# Patient Record
Sex: Male | Born: 1960
Health system: Southern US, Community
[De-identification: ages and names within clinical notes are randomized; demographics above are authoritative.]

## PROBLEM LIST (undated history)

## (undated) ENCOUNTER — Emergency Department (HOSPITAL_COMMUNITY): Disposition: A | Discharge status: Home / Self Care | Payer: 59

## (undated) DIAGNOSIS — E669 Obesity, unspecified: Secondary | ICD-10-CM

## (undated) DIAGNOSIS — E785 Hyperlipidemia, unspecified: Secondary | ICD-10-CM

## (undated) DIAGNOSIS — G4730 Sleep apnea, unspecified: Secondary | ICD-10-CM

## (undated) DIAGNOSIS — E119 Type 2 diabetes mellitus without complications: Secondary | ICD-10-CM

## (undated) DIAGNOSIS — Z8673 Personal history of transient ischemic attack (TIA), and cerebral infarction without residual deficits: Secondary | ICD-10-CM

## (undated) DIAGNOSIS — M461 Sacroiliitis, not elsewhere classified: Secondary | ICD-10-CM

## (undated) DIAGNOSIS — I10 Essential (primary) hypertension: Secondary | ICD-10-CM

## (undated) DIAGNOSIS — I8392 Asymptomatic varicose veins of left lower extremity: Secondary | ICD-10-CM

## (undated) DIAGNOSIS — L255 Unspecified contact dermatitis due to plants, except food: Secondary | ICD-10-CM

## (undated) HISTORY — DX: Unspecified contact dermatitis due to plants, except food: L25.5

## (undated) HISTORY — DX: Obesity, unspecified: E66.9

## (undated) HISTORY — DX: Essential (primary) hypertension: I10

## (undated) HISTORY — PX: HERNIA REPAIR: SHX51

## (undated) HISTORY — DX: Hyperlipidemia, unspecified: E78.5

## (undated) HISTORY — DX: Sleep apnea, unspecified: G47.30

## (undated) HISTORY — DX: Personal history of transient ischemic attack (TIA), and cerebral infarction without residual deficits: Z86.73

## (undated) HISTORY — DX: Sacroiliitis, not elsewhere classified: M46.1

---

## 2003-06-09 ENCOUNTER — Encounter: Payer: Self-pay | Admitting: Emergency Medicine

## 2003-06-09 ENCOUNTER — Emergency Department (HOSPITAL_COMMUNITY): Admission: EM | Admit: 2003-06-09 | Discharge: 2003-06-09 | Payer: Self-pay | Admitting: Emergency Medicine

## 2003-10-23 ENCOUNTER — Encounter: Admission: RE | Admit: 2003-10-23 | Discharge: 2003-10-23 | Payer: Self-pay | Admitting: Family Medicine

## 2004-04-09 ENCOUNTER — Encounter: Payer: Self-pay | Admitting: Family Medicine

## 2004-04-09 LAB — CONVERTED CEMR LAB: PSA: 1.2 ng/mL

## 2005-07-22 ENCOUNTER — Ambulatory Visit: Payer: Self-pay | Admitting: Family Medicine

## 2005-10-18 ENCOUNTER — Other Ambulatory Visit: Payer: Self-pay

## 2005-10-18 ENCOUNTER — Emergency Department: Payer: Self-pay | Admitting: Emergency Medicine

## 2005-10-21 ENCOUNTER — Ambulatory Visit: Payer: Self-pay | Admitting: Family Medicine

## 2006-10-20 ENCOUNTER — Ambulatory Visit: Payer: Self-pay | Admitting: Family Medicine

## 2006-11-09 ENCOUNTER — Ambulatory Visit: Payer: Self-pay | Admitting: Family Medicine

## 2006-11-11 ENCOUNTER — Ambulatory Visit: Payer: Self-pay | Admitting: Family Medicine

## 2006-11-16 ENCOUNTER — Ambulatory Visit: Payer: Self-pay

## 2006-11-16 HISTORY — PX: OTHER SURGICAL HISTORY: SHX169

## 2006-11-17 ENCOUNTER — Encounter: Payer: Self-pay | Admitting: Family Medicine

## 2006-11-30 ENCOUNTER — Encounter: Payer: Self-pay | Admitting: Family Medicine

## 2006-12-22 ENCOUNTER — Ambulatory Visit: Payer: Self-pay

## 2006-12-30 ENCOUNTER — Ambulatory Visit: Payer: Self-pay | Admitting: Cardiovascular Disease

## 2006-12-30 ENCOUNTER — Ambulatory Visit (HOSPITAL_COMMUNITY): Admission: RE | Admit: 2006-12-30 | Discharge: 2006-12-30 | Payer: Self-pay | Admitting: Cardiovascular Disease

## 2007-01-12 ENCOUNTER — Telehealth (INDEPENDENT_AMBULATORY_CARE_PROVIDER_SITE_OTHER): Payer: Self-pay | Admitting: *Deleted

## 2007-01-17 ENCOUNTER — Encounter: Payer: Self-pay | Admitting: Family Medicine

## 2007-02-01 ENCOUNTER — Encounter: Payer: Self-pay | Admitting: Family Medicine

## 2007-02-01 DIAGNOSIS — Z8679 Personal history of other diseases of the circulatory system: Secondary | ICD-10-CM | POA: Insufficient documentation

## 2007-02-01 DIAGNOSIS — I10 Essential (primary) hypertension: Secondary | ICD-10-CM | POA: Insufficient documentation

## 2007-02-01 DIAGNOSIS — M461 Sacroiliitis, not elsewhere classified: Secondary | ICD-10-CM

## 2007-02-02 ENCOUNTER — Ambulatory Visit: Payer: Self-pay | Admitting: Family Medicine

## 2007-02-02 DIAGNOSIS — E785 Hyperlipidemia, unspecified: Secondary | ICD-10-CM

## 2007-02-02 DIAGNOSIS — E1169 Type 2 diabetes mellitus with other specified complication: Secondary | ICD-10-CM | POA: Insufficient documentation

## 2007-02-02 DIAGNOSIS — R319 Hematuria, unspecified: Secondary | ICD-10-CM | POA: Insufficient documentation

## 2007-02-02 LAB — CONVERTED CEMR LAB
Bilirubin Urine: NEGATIVE
Glucose, Urine, Semiquant: NEGATIVE
Ketones, urine, test strip: NEGATIVE
Nitrite: NEGATIVE
Specific Gravity, Urine: 1.025
Urobilinogen, UA: 0.2
WBC Urine, dipstick: NEGATIVE

## 2007-02-03 ENCOUNTER — Encounter: Payer: Self-pay | Admitting: Family Medicine

## 2007-02-07 LAB — CONVERTED CEMR LAB
ALT: 32 units/L (ref 0–40)
AST: 28 units/L (ref 0–37)
Albumin: 3.4 g/dL — ABNORMAL LOW (ref 3.5–5.2)
BUN: 12 mg/dL (ref 6–23)
Basophils Absolute: 0 10*3/uL (ref 0.0–0.1)
Basophils Relative: 0.2 % (ref 0.0–1.0)
CO2: 28 meq/L (ref 19–32)
Calcium: 9.1 mg/dL (ref 8.4–10.5)
Chloride: 107 meq/L (ref 96–112)
Cholesterol: 128 mg/dL (ref 0–200)
Creatinine, Ser: 1.2 mg/dL (ref 0.4–1.5)
Eosinophils Absolute: 0.1 10*3/uL (ref 0.0–0.6)
Eosinophils Relative: 1.3 % (ref 0.0–5.0)
GFR calc Af Amer: 84 mL/min
GFR calc non Af Amer: 69 mL/min
Glucose, Bld: 99 mg/dL (ref 70–99)
HCT: 35.6 % — ABNORMAL LOW (ref 39.0–52.0)
HDL: 47.6 mg/dL (ref >39.0)
Hemoglobin: 12.3 g/dL — ABNORMAL LOW (ref 13.0–17.0)
LDL Cholesterol: 65 mg/dL (ref 0–99)
Lymphocytes Relative: 28.8 % (ref 12.0–46.0)
MCHC: 34.6 g/dL (ref 30.0–36.0)
MCV: 96.7 fL (ref 78.0–100.0)
Monocytes Absolute: 0.8 10*3/uL — ABNORMAL HIGH (ref 0.2–0.7)
Monocytes Relative: 10.7 % (ref 3.0–11.0)
Neutro Abs: 4.7 10*3/uL (ref 1.4–7.7)
Neutrophils Relative %: 59 % (ref 43.0–77.0)
Phosphorus: 3.6 mg/dL (ref 2.3–4.6)
Platelets: 322 10*3/uL (ref 150–400)
Potassium: 3.7 meq/L (ref 3.5–5.1)
RBC: 3.68 M/uL — ABNORMAL LOW (ref 4.22–5.81)
RDW: 12.2 % (ref 11.5–14.6)
Sodium: 139 meq/L (ref 135–145)
Total CHOL/HDL Ratio: 2.7
Triglycerides: 77 mg/dL (ref 0–149)
VLDL: 15 mg/dL (ref 0–40)
WBC: 7.9 10*3/uL (ref 4.5–10.5)

## 2007-05-15 ENCOUNTER — Encounter: Payer: Self-pay | Admitting: Family Medicine

## 2008-01-20 ENCOUNTER — Telehealth: Payer: Self-pay | Admitting: Family Medicine

## 2008-07-25 ENCOUNTER — Ambulatory Visit: Payer: Self-pay | Admitting: Family Medicine

## 2008-08-24 ENCOUNTER — Ambulatory Visit: Payer: Self-pay | Admitting: Family Medicine

## 2008-08-28 LAB — CONVERTED CEMR LAB
ALT: 36 units/L (ref 0–53)
AST: 34 units/L (ref 0–37)
Albumin: 3.8 g/dL (ref 3.5–5.2)
Alkaline Phosphatase: 50 units/L (ref 39–117)
BUN: 22 mg/dL (ref 6–23)
Basophils Absolute: 0 10*3/uL (ref 0.0–0.1)
Basophils Relative: 0.1 % (ref 0.0–3.0)
Bilirubin, Direct: 0.2 mg/dL (ref 0.0–0.3)
CO2: 28 meq/L (ref 19–32)
Calcium: 9.5 mg/dL (ref 8.4–10.5)
Chloride: 105 meq/L (ref 96–112)
Cholesterol: 177 mg/dL (ref 0–200)
Creatinine, Ser: 1.2 mg/dL (ref 0.4–1.5)
Eosinophils Absolute: 0.2 10*3/uL (ref 0.0–0.7)
Eosinophils Relative: 2.1 % (ref 0.0–5.0)
GFR calc Af Amer: 83 mL/min
GFR calc non Af Amer: 69 mL/min
Glucose, Bld: 107 mg/dL — ABNORMAL HIGH (ref 70–99)
HCT: 42.2 % (ref 39.0–52.0)
HDL: 37.6 mg/dL — ABNORMAL LOW (ref >39.0)
Hemoglobin: 14.8 g/dL (ref 13.0–17.0)
LDL Cholesterol: 125 mg/dL — ABNORMAL HIGH (ref 0–99)
Lymphocytes Relative: 37.6 % (ref 12.0–46.0)
MCHC: 35 g/dL (ref 30.0–36.0)
MCV: 94.9 fL (ref 78.0–100.0)
Monocytes Absolute: 1 10*3/uL (ref 0.1–1.0)
Monocytes Relative: 13.4 % — ABNORMAL HIGH (ref 3.0–12.0)
Neutro Abs: 3.4 10*3/uL (ref 1.4–7.7)
Neutrophils Relative %: 46.8 % (ref 43.0–77.0)
Platelets: 238 10*3/uL (ref 150–400)
Potassium: 3.9 meq/L (ref 3.5–5.1)
RBC: 4.45 M/uL (ref 4.22–5.81)
RDW: 12.4 % (ref 11.5–14.6)
Sodium: 138 meq/L (ref 135–145)
TSH: 0.76 microintl units/mL (ref 0.35–5.50)
Total Bilirubin: 1.2 mg/dL (ref 0.3–1.2)
Total CHOL/HDL Ratio: 4.7
Total Protein: 7.6 g/dL (ref 6.0–8.3)
Triglycerides: 72 mg/dL (ref 0–149)
VLDL: 14 mg/dL (ref 0–40)
WBC: 7.3 10*3/uL (ref 4.5–10.5)

## 2008-09-24 ENCOUNTER — Telehealth: Payer: Self-pay | Admitting: Family Medicine

## 2008-11-08 ENCOUNTER — Ambulatory Visit: Payer: Self-pay | Admitting: Family Medicine

## 2009-02-01 ENCOUNTER — Ambulatory Visit: Payer: Self-pay | Admitting: Internal Medicine

## 2009-04-24 ENCOUNTER — Telehealth: Payer: Self-pay | Admitting: Family Medicine

## 2009-09-02 ENCOUNTER — Ambulatory Visit: Payer: Self-pay | Admitting: Family Medicine

## 2009-09-02 ENCOUNTER — Telehealth: Payer: Self-pay | Admitting: Family Medicine

## 2009-09-02 DIAGNOSIS — E669 Obesity, unspecified: Secondary | ICD-10-CM

## 2009-09-06 ENCOUNTER — Ambulatory Visit: Payer: Self-pay | Admitting: Family Medicine

## 2009-10-02 ENCOUNTER — Ambulatory Visit: Payer: Self-pay | Admitting: Pulmonary Disease

## 2009-10-03 ENCOUNTER — Ambulatory Visit: Payer: Self-pay | Admitting: Pulmonary Disease

## 2009-10-07 ENCOUNTER — Telehealth: Payer: Self-pay | Admitting: Pulmonary Disease

## 2009-10-15 DIAGNOSIS — G4733 Obstructive sleep apnea (adult) (pediatric): Secondary | ICD-10-CM

## 2009-11-07 ENCOUNTER — Encounter: Payer: Self-pay | Admitting: Pulmonary Disease

## 2010-01-02 ENCOUNTER — Ambulatory Visit: Payer: Self-pay | Admitting: Family Medicine

## 2010-01-02 LAB — CONVERTED CEMR LAB
Albumin: 3.9 g/dL (ref 3.5–5.2)
BUN: 16 mg/dL (ref 6–23)
Basophils Absolute: 0 10*3/uL (ref 0.0–0.1)
Basophils Relative: 0.4 % (ref 0.0–3.0)
CO2: 26 meq/L (ref 19–32)
Calcium: 9.7 mg/dL (ref 8.4–10.5)
Chloride: 100 meq/L (ref 96–112)
Creatinine, Ser: 1.1 mg/dL (ref 0.4–1.5)
Eosinophils Absolute: 0.1 10*3/uL (ref 0.0–0.7)
Eosinophils Relative: 1.2 % (ref 0.0–5.0)
GFR calc non Af Amer: 88.68 mL/min (ref >60)
Glucose, Bld: 111 mg/dL — ABNORMAL HIGH (ref 70–99)
HCT: 43.4 % (ref 39.0–52.0)
Hemoglobin: 14.8 g/dL (ref 13.0–17.0)
Lymphocytes Relative: 33.1 % (ref 12.0–46.0)
Lymphs Abs: 2.5 10*3/uL (ref 0.7–4.0)
MCHC: 34.1 g/dL (ref 30.0–36.0)
MCV: 96.4 fL (ref 78.0–100.0)
Monocytes Absolute: 0.7 10*3/uL (ref 0.1–1.0)
Monocytes Relative: 9.6 % (ref 3.0–12.0)
Neutro Abs: 4.3 10*3/uL (ref 1.4–7.7)
Neutrophils Relative %: 55.7 % (ref 43.0–77.0)
Phosphorus: 3.1 mg/dL (ref 2.3–4.6)
Platelets: 285 10*3/uL (ref 150.0–400.0)
Potassium: 4.1 meq/L (ref 3.5–5.1)
RBC: 4.5 M/uL (ref 4.22–5.81)
RDW: 12.8 % (ref 11.5–14.6)
Sodium: 135 meq/L (ref 135–145)
Uric Acid, Serum: 8.6 mg/dL — ABNORMAL HIGH (ref 4.0–7.8)
WBC: 7.7 10*3/uL (ref 4.5–10.5)

## 2010-02-26 ENCOUNTER — Encounter: Payer: Self-pay | Admitting: Family Medicine

## 2010-04-15 ENCOUNTER — Telehealth: Payer: Self-pay | Admitting: Family Medicine

## 2010-04-23 ENCOUNTER — Ambulatory Visit: Payer: Self-pay | Admitting: Family Medicine

## 2010-09-18 ENCOUNTER — Ambulatory Visit: Payer: Self-pay | Admitting: Family Medicine

## 2010-09-18 NOTE — Miscellaneous (Signed)
Summary: reviewed apnea link with pt.   Clinical Lists Changes  finally got in touch with patient about sleep study, showing very mild osa.  He is essentially asymptomatic, and is currently trying to work on weight loss.  This is not a CV risk for him.  At this point, he would like to just work on weight loss, and to see how things go.  He will call if increasing symptoms.  He understands that he is to not drive if having sleepiness issues.

## 2010-09-18 NOTE — Letter (Signed)
Summary: Out of Work  [employer] at [facility]  [address] [place], [place] [address]   Phone: [phone]  Fax: [phone]    September 06, 2009   Employee:  Fayrene Fearing A Corsetti    To Whom It May Concern:   For Medical reasons, please excuse the above named employee from work for the following dates:  Start:   09/06/2009  End:   can retun monday 09/09/2009 if he is feeling better   If you need additional information, please feel free to contact our office.         Sincerely,    Judith Part MD

## 2010-09-18 NOTE — Assessment & Plan Note (Signed)
Summary: SWOLLEN R FOOT/CLE   Vital Signs:  Patient profile:   50 year old male Height:      71 inches Weight:      274.50 pounds BMI:     38.42 Temp:     98.1 degrees F oral Pulse rate:   72 / minute Pulse rhythm:   regular BP sitting:   132 / 90  (left arm) Cuff size:   large  Vitals Entered By: Lewanda Rife LPN (Jan 02, 2010 9:15 AM) CC: swollen rt foot with pain at ankle on and off   History of Present Illness: r foot is aching and swelling  is hurting around ankle comes and goes  sometimes cannot get into shoes no trauma  worse when he gets up in the am - stiff and hard to walk on   no redness no heat  just swollen  no previous injury in the past   no meds for it   big toe feels just a little numb   is wearing uncomfortable shoes at work -- uses orthotics in them -- they feel good now   no hx of gout  not really that painful   bp is up today  2 pharmacies are out of his benicar and he has not had any in 2 days ? do we have samples  his regular pharmacy promises it to him by 2 pm- will get back on it  bp has been well controlled no headaches or other symptoms  other than this- no missed doses   Allergies: 1)  Lotensin  Past History:  Past Medical History: Last updated: 10/02/2009   OBESITY (ICD-278.00) CONTACT DERMATITIS&OTHER ECZEMA DUE TO PLANTS (ICD-692.6) HEMATURIA, MICROSCOPIC (ICD-599.7) HYPERLIPIDEMIA (ICD-272.4) Hx of SACROILIITIS (ICD-720.2) CEREBROVASCULAR ACCIDENT, HX OF (ICD-V12.50) HYPERTENSION (ICD-401.9)    Past Surgical History: Last updated: 02/01/2007 Hernia- umbilical Carotid dopplers- neg (11/2006)  Family History: Last updated: 02/02/2007 Father:  Mother: HTN, DM Siblings:  no cancer in family some high chol in family  Social History: Last updated: 10/02/2009 Marital Status: Married Children: 2 Occupation: truck Hospital doctor for [employer]  Risk Factors: Smoking Status: never (02/01/2007)  Review  of Systems General:  Denies chills, fatigue, fever, loss of appetite, and malaise. Eyes:  Denies blurring and eye pain. CV:  Denies chest pain or discomfort, lightheadness, and palpitations. Resp:  Denies cough, pleuritic, shortness of breath, and wheezing. GI:  Denies abdominal pain, change in bowel habits, and nausea. GU:  Denies dysuria. MS:  Complains of joint pain, joint swelling, and stiffness; denies joint redness, muscle aches, cramps, and muscle weakness. Derm:  Denies itching, lesion(s), poor wound healing, and rash. Neuro:  Denies numbness, tingling, and weakness. Heme:  Denies abnormal bruising and bleeding.  Physical Exam  General:  overweight but generally well appearing  Eyes:  vision grossly intact, pupils equal, pupils round, pupils reactive to light, and no injection.   Mouth:  pharynx pink and moist.   Neck:  supple with full rom and no masses or thyromegally, no JVD or carotid bruit  Lungs:  CTA wth harsh bs at bases no rales or rhonchi or wheeze   Heart:  Normal rate and regular rhythm. S1 and S2 normal without gallop, murmur, click, rub or other extra sounds. Msk:  R ankle swelling with poss effusion- bilat malleoli bony tenderness med malleolus and behind it  nl rom - pain on plantarflex nl talar tilt no foot tenderness favors other foot  marked ped planus iin R  foot   Pulses:  R and L carotid,radial,femoral,dorsalis pedis and posterior tibial pulses are full and equal bilaterally Neurologic:  sensation intact to light touch and DTRs symmetrical and normal.   Skin:  Intact without suspicious lesions or rashes Psych:  normal affect, talkative and pleasant    Impression & Recommendations:  Problem # 1:  ANKLE PAIN (ICD-719.47) Assessment New with focal bilat swelling/poss eff  no hx trauma most pain / tenderness is medial  check labs incl uric acid - but doubt gout x ray  mobic 15 once daily with food then update with plan after getting results    Orders: TLB-Renal Function Panel (80069-RENAL) TLB-CBC Platelet - w/Differential (85025-CBCD) TLB-Uric Acid, Blood (84550-URIC)  Problem # 2:  HYPERTENSION (ICD-401.9) Assessment: Unchanged 2 d without benicar due to pharmacy out-- will get today lab today bp up a little - expected without med  His updated medication list for this problem includes:    Benicar Hct 40-25 Mg Tabs (Olmesartan medoxomil-hctz) .Marland Kitchen... Take one by mouth daily    Lopressor 50 Mg Tabs (Metoprolol tartrate) .Marland Kitchen... 1/2 by mouth two times a day    Hydrochlorothiazide 25 Mg Tabs (Hydrochlorothiazide) .Marland Kitchen... Take one by mouth daily  Orders: Venipuncture (04540) TLB-Renal Function Panel (80069-RENAL)  Complete Medication List: 1)  Benicar Hct 40-25 Mg Tabs (Olmesartan medoxomil-hctz) .... Take one by mouth daily 2)  Bayer Aspirin 325 Mg Tabs (Aspirin) .... Take one by mouth daily 3)  Lopressor 50 Mg Tabs (Metoprolol tartrate) .... 1/2 by mouth two times a day 4)  Hydrochlorothiazide 25 Mg Tabs (Hydrochlorothiazide) .... Take one by mouth daily 5)  Mobic 15 Mg Tabs (Meloxicam) .Marland Kitchen.. 1 by mouth once daily  Other Orders: Radiology Referral (Radiology)  Patient Instructions: 1)  we will schedule x ray in [place] at check out  2)  start mobic with food one pill daily --  stop if it gives you GI upset  3)  labs today  4)  will make plan when tests return  5)  use ice on ankle when it is painful  Prescriptions: MOBIC 15 MG TABS (MELOXICAM) 1 by mouth once daily  #30 x 0   Entered and Authorized by:   Judith Part MD   Signed by:   Judith Part MD on 01/02/2010   Method used:   [facility]        [facility]  [address] [facility]       [address]       [place], [place]  [address]       Ph: [phone] or [phone]       Fax: [phone]   RxID:   [id number]   Current Allergies (reviewed today): LOTENSIN

## 2010-09-18 NOTE — Progress Notes (Signed)
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 Patient states that he has to  have a test done and if you can not perform it then, you need to refer him to someone to do it, the test is a maintenance of wakefulness test. Please advise. Initial call taken by: Sydell Axon LPN,  September 02, 2009 11:00 AM  Follow-up for Phone Call        ok- I will go ahead and do ref to our sleep clinic -- will route to [facility] Follow-up by: Judith Part MD,  September 02, 2009 11:10 AM  Additional Follow-up for Phone Call Additional follow up Details #1::        Pulmonary appt made with Dr Shelle Iron on 09/19/2009 at 8:45am, patient notified by phone. Additional Follow-up by: Carlton Adam,  September 04, 2009 8:39 AM

## 2010-09-18 NOTE — Assessment & Plan Note (Signed)
Summary: COUGH  CYD   Vital Signs:  Patient profile:   50 year old male Weight:      271 pounds Temp:     98 degrees F oral Pulse rate:   84 / minute Pulse rhythm:   regular BP sitting:   108 / 58  (left arm) Cuff size:   large  Vitals Entered By: Lowella Petties CMA (September 06, 2009 10:18 AM) CC: Cough, chest congestion, achy.   History of Present Illness: symptoms started wed with fever and cold symptoms  fever was 102 --just first day- now back to normal coughing severely - runny nose and drainage  chest is sore from cough  little phlesm -- yellow to brown  no wheeze- but chest is rattling   not taking much otc - except chlorcedin which is not helping  tylenol for fever      Allergies: 1)  Lotensin  Past History:  Past Medical History: Last updated: 07/25/2008 Hypertension Cerebrovascular accident, hx of hx of sacroilitis   Past Surgical History: Last updated: 02/01/2007 Hernia- umbilical Carotid dopplers- neg (11/2006)  Family History: Last updated: 02/02/2007 Father:  Mother: HTN, DM Siblings:  no cancer in family some high chol in family  Social History: Last updated: 02/01/2007 Marital Status: Married Children: 2 Occupation: truck Hospital doctor  Risk Factors: Smoking Status: never (02/01/2007)  Review of Systems General:  Complains of chills, fatigue, fever, and loss of appetite. Eyes:  Denies discharge and eye irritation. ENT:  Complains of nasal congestion, postnasal drainage, and sore throat; denies earache and sinus pressure. CV:  Denies chest pain or discomfort and palpitations. Resp:  Complains of cough; denies shortness of breath and wheezing. GI:  Denies abdominal pain and change in bowel habits. MS:  Complains of muscle aches. Derm:  Denies rash.  Physical Exam  General:  overwt and fatigued appearing Head:  normocephalic, atraumatic, and no abnormalities observed.  no sinus tenderness Eyes:  vision grossly intact, pupils equal,  pupils round, pupils reactive to light, and no injection.   Ears:  R ear normal and L ear normal.   Nose:  nares injected with clear rhinorrhea Mouth:  pharynx pink and moist, no erythema, and no exudates.   Neck:  supple with full rom and no masses or thyromegally, no JVD or carotid bruit  Chest Wall:  No deformities, masses, tenderness or gynecomastia noted. Lungs:  CTA wth harsh bs at bases no rales or rhonchi or wheeze   Heart:  Normal rate and regular rhythm. S1 and S2 normal without gallop, murmur, click, rub or other extra sounds. Skin:  Intact without suspicious lesions or rashes Cervical Nodes:  No lymphadenopathy noted Psych:  normal affect, talkative and pleasant    Impression & Recommendations:  Problem # 1:  URI (ICD-465.9) Assessment New viral with cough and congestion  recommend sympt care- see pt instructions   tessalon and tussionex with caution expectorant and antihistamine as needed otc  pt advised to update me if symptoms worsen or do not improve - esp cough wheeze or fever  His updated medication list for this problem includes:    Bayer Aspirin 325 Mg Tabs (Aspirin) .Marland Kitchen... Take one by mouth daily    Tessalon 200 Mg Caps (Benzonatate) .Marland Kitchen... 1 by mouth up to three times a day as needed cough    Tussionex Pennkinetic Er 8-10 Mg/27ml Lqcr (Chlorpheniramine-hydrocodone) .Marland Kitchen... 1/2 to 1 teaspoon by mouth at bedtime as needed severe cough  Complete Medication List: 1)  Benicar Hct  40-25 Mg Tabs (Olmesartan medoxomil-hctz) .... Take one by mouth daily 2)  Bayer Aspirin 325 Mg Tabs (Aspirin) .... Take one by mouth daily 3)  Lopressor 50 Mg Tabs (Metoprolol tartrate) .... 1/2 by mouth two times a day 4)  Hydrochlorothiazide 25 Mg Tabs (Hydrochlorothiazide) .... Take one by mouth daily 5)  Tessalon 200 Mg Caps (Benzonatate) .Marland Kitchen.. 1 by mouth up to three times a day as needed cough 6)  Tussionex Pennkinetic Er 8-10 Mg/32ml Lqcr (Chlorpheniramine-hydrocodone) .... 1/2 to 1  teaspoon by mouth at bedtime as needed severe cough  Patient Instructions: 1)  try zyrtec 10 mg daily for runny nose 2)  tylenol for aches and fever 3)  lots of fluids and rest  4)  tessalon for cough during the day 5)  tussionex for cough at night - caution of sedation  6)  update me if high fever or increased cough or wheezing  Prescriptions: TUSSIONEX PENNKINETIC ER 8-10 MG/5ML LQCR (CHLORPHENIRAMINE-HYDROCODONE) 1/2 to 1 teaspoon by mouth at bedtime as needed severe cough  #8 oz x 0   Entered and Authorized by:   Judith Part MD   Signed by:   Judith Part MD on 09/06/2009   Method used:   Print then Give to Patient   RxID:   [id number] TESSALON 200 MG CAPS (BENZONATATE) 1 by mouth up to three times a day as needed cough  #30 x 0   Entered and Authorized by:   Judith Part MD   Signed by:   Judith Part MD on 09/06/2009   Method used:   Print then Give to Patient   RxID:   [id number]

## 2010-09-18 NOTE — Medication Information (Signed)
Summary: Benicar HCT Approved  Benicar HCT Approved   Imported By: Maryln Gottron 05/16/2010 11:29:03  _____________________________________________________________________  External Attachment:    Type:   Image     Comment:   External Document

## 2010-09-18 NOTE — Progress Notes (Signed)
Summary: refill request for tussionex  Phone Note Refill Request Message from:  Fax from Pharmacy  Refills Requested: Medication #1:  tussionex   Last Refilled: 09/07/2009 Faxed request from [facility], [phone]  Initial call taken by: Lowella Petties CMA,  April 15, 2010 8:12 AM  Follow-up for Phone Call        this is controlled substance- not generally refilled without visit  Follow-up by: Judith Part MD,  April 15, 2010 8:39 AM  Additional Follow-up for Phone Call Additional follow up Details #1::        Unable to reach pt by phone, left message with Alician at [facility] to let pt know controllled substance and needs to call for appt.Lewanda Rife LPN  April 15, 2010 9:54 AM

## 2010-09-18 NOTE — Assessment & Plan Note (Signed)
Summary: consult for possible osa   Copy to:  [employer] Primary Provider/Referring Provider:  Judith Part MD  CC:  Sleep Consult.  History of Present Illness: The pt is a 50y/o male who I have been asked to see for possible osa.  He has been noted to have loud snoring by his wife, but she has never commented on pauses in his breathing during sleep.  He denies choking arousals.  He goes to bed around midnight, and arises approx 8am to start his day.  He denies frequent awakenings, and feels rested in the am's upon arising.  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  He has only occasional sleep pressure with inactivity during his off days.  He denies any sleepiness in church, or while watching tv/movies on w/e.  He denies any sleepiness while driving.  His epworth score today is 6.  Medications Prior to Update: 1)  Benicar Hct 40-25 Mg  Tabs (Olmesartan Medoxomil-Hctz) .... Take One By Mouth Daily 2)  Bayer Aspirin 325 Mg  Tabs (Aspirin) .... Take One By Mouth Daily 3)  Lopressor 50 Mg Tabs (Metoprolol Tartrate) .... 1/2 By Mouth Two Times A Day 4)  Hydrochlorothiazide 25 Mg Tabs (Hydrochlorothiazide) .... Take One By Mouth Daily 5)  Tessalon 200 Mg Caps (Benzonatate) .Marland Kitchen.. 1 By Mouth Up To Three Times A Day As Needed Cough 6)  Tussionex Pennkinetic Er 8-10 Mg/68ml Lqcr (Chlorpheniramine-Hydrocodone) .... 1/2 To 1 Teaspoon By Mouth At Bedtime As Needed Severe Cough  Allergies (verified): 1)  Lotensin  Past History:  Past Medical History:   OBESITY (ICD-278.00) CONTACT DERMATITIS&OTHER ECZEMA DUE TO PLANTS (ICD-692.6) HEMATURIA, MICROSCOPIC (ICD-599.7) HYPERLIPIDEMIA (ICD-272.4) Hx of SACROILIITIS (ICD-720.2) CEREBROVASCULAR ACCIDENT, HX OF (ICD-V12.50) HYPERTENSION (ICD-401.9)    Past Surgical History: Reviewed history from 02/01/2007 and no changes required. Hernia- umbilical Carotid  dopplers- neg (11/2006)  Family History: Reviewed history from 02/02/2007 and no changes required. Father:  Mother: HTN, DM Siblings:  no cancer in family some high chol in family  Social History: Reviewed history from 02/01/2007 and no changes required. Marital Status: Married Children: 2 Occupation: truck Hospital doctor for [employer]  Review of Systems  The patient denies shortness of breath with activity, shortness of breath at rest, productive cough, non-productive cough, coughing up blood, chest pain, irregular heartbeats, acid heartburn, indigestion, loss of appetite, weight change, abdominal pain, difficulty swallowing, sore throat, tooth/dental problems, headaches, nasal congestion/difficulty breathing through nose, sneezing, itching, ear ache, anxiety, depression, hand/feet swelling, joint stiffness or pain, rash, change in color of mucus, and fever.    Vital Signs:  Patient profile:   50 year old male Height:      71 inches Weight:      274.50 pounds BMI:     38.42 O2 Sat:      98 % on Room air Temp:     97.6 degrees F oral Pulse rate:   77 / minute BP sitting:   138 / 90  (left arm) Cuff size:   large  Vitals Entered By: Arman Filter LPN (October 02, 2009 9:14 AM)  O2 Flow:  Room air CC: Sleep Consult Comments Medications reviewed with patient Arman Filter LPN  October 02, 2009 9:14 AM    Physical Exam  General:  ow male in nad Eyes:  PERRLA and EOMI.   Nose:  deviated septum to left, but patent bilat Mouth:  moderate elongation of soft palate and uvula Neck:  no jvd, tmg, LN Lungs:  clear to auscultation Heart:  rrr, no mrg Abdomen:  soft and nontender, bs+ Extremities:  no edema or cyanosis pulses intact distally Neurologic:  alert and oriented, moves all 4. doesn't appear sleepy.   Impression & Recommendations:  Problem # 1:  SNORING (ICD-786.09) the pt is a snorer, but overall is considered low risk for clinically significant  sleep apnea historically.  However, he is a truck driver, overweight, and has upper airway anatomy.  It is also hard to get a history for EDS due to the physical nature of his job during the day.  I think it is worthwhile doing a screening study at home, and if positive will need to consider treatment/further evaluation.  The pt is agreeable to this approach.  Other Orders: Misc. Referral (Misc. Ref) Consultation Level IV (16109)  Patient Instructions: 1)  will check screening sleep study at home.  I will call you with results. 2)  work on weight loss, stay off your back while sleeping.

## 2010-09-18 NOTE — Progress Notes (Signed)
Summary: sleep study results  Phone Note Call from Patient Call back at Home Phone [phone]   Caller: Patient Call For: Tereka Thorley Summary of Call: Wants results of his sleep study. Initial call taken by: Darletta Moll,  October 07, 2009 11:43 AM  Follow-up for Phone Call        Patient did apnea link. Please advise on results or if patient needs ROV to discuss.Michel Bickers [facility]  October 07, 2009 11:57 AM  Additional Follow-up for Phone Call Additional follow up Details #1::        called pt at home and asked that he call back with a number where we can reach him easily.  I will call him at that number and discuss his sleep study results.   Additional Follow-up by: Barbaraann Share MD,  October 07, 2009 5:25 PM     Appended Document: sleep study results megan, we still have not heard back from this pt.  his home number is not good for communication.Marland KitchenMarland KitchenI have tried.  See if you can get ahold of him and get [place] a better contact number.  Appended Document: sleep study results called and spoke with pt.  pt states his home # is the best # to reach him on during the day, but if you call in the evenings, pt works 2nd shift and requests you call him on his cell [phone].    Appended Document: sleep study results called and left message on cell phone. will call back

## 2010-09-18 NOTE — Assessment & Plan Note (Signed)
Summary: apnea link shows AHI 9/hr.   Copy to:  [facility] Primary Provider/Referring Provider:  Judith Part MD   History of Present Illness: apnea link shows AHI of 9/hr with desat to 82%.  this is c/w with mild osa.  I have tried to contact pt to discuss results.  Have LMOM to call us back.  Allergies: 1)  Lotensin   Appended Document: Orders Update     Clinical Lists Changes  Problems: Added new problem of OBSTRUCTIVE SLEEP APNEA (ICD-327.23) Orders: Added new Service order of Pulse Oximetry, Overnight [phone]) - Signed

## 2010-09-18 NOTE — Assessment & Plan Note (Signed)
Summary: SLEEP APNEA/CLE   Vital Signs:  Patient profile:   50 year old male Weight:      271 pounds BMI:     37.93 Temp:     97.4 degrees F oral Pulse rate:   76 / minute Pulse rhythm:   regular BP sitting:   132 / 90  (left arm) Cuff size:   large  Vitals Entered By: Lowella Petties CMA (September 02, 2009 8:50 AM)  Serial Vital Signs/Assessments:  Time      Position  BP       Pulse  Resp  Temp     By                     125/80                         Judith Part MD  CC: Discuss need for sleep study   History of Present Illness: had a DOT physical -- and sleep study was recommended   knows he needs to loose wt  gained a lot over holidays -- loosing before  is now back to better eating habits  higher intake of fiber  too much juice  does eat a lot of fruit and vegetables  walking 35-40 minutes per day   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   does snore - not loudly  no witnessed apnea  does wake up rested - is full of energy does not get sleepy during the day     Allergies: 1)  Lotensin  Past History:  Past Medical History: Last updated: 07/25/2008 Hypertension Cerebrovascular accident, hx of hx of sacroilitis   Past Surgical History: Last updated: 02/01/2007 Hernia- umbilical Carotid dopplers- neg (11/2006)  Family History: Last updated: 02/02/2007 Father:  Mother: HTN, DM Siblings:  no cancer in family some high chol in family  Social History: Last updated: 02/01/2007 Marital Status: Married Children: 2 Occupation: truck Hospital doctor  Risk Factors: Smoking Status: never (02/01/2007)  Review of Systems General:  Denies fatigue, loss of appetite, and malaise. Eyes:  Denies blurring and eye irritation. CV:  Denies chest pain or discomfort and palpitations. Resp:  Denies cough, shortness of breath, and wheezing. GI:  Denies abdominal pain and change in bowel habits. Derm:  Denies itching, lesion(s), poor wound healing, and rash. Neuro:   Denies numbness and tingling. Endo:  Denies cold intolerance, excessive thirst, excessive urination, and heat intolerance.  Physical Exam  General:  overweight but generally well appearing  Head:  normocephalic, atraumatic, and no abnormalities observed.   Eyes:  vision grossly intact, pupils equal, pupils round, and pupils reactive to light.   Mouth:  pharynx pink and moist.   uvula appears to be nl size Neck:  supple with full rom and no masses or thyromegally, no JVD or carotid bruit  Chest Wall:  No deformities, masses, tenderness or gynecomastia noted. Lungs:  Normal respiratory effort, chest expands symmetrically. Lungs are clear to auscultation, no crackles or wheezes. Heart:  Normal rate and regular rhythm. S1 and S2 normal without gallop, murmur, click, rub or other extra sounds. Extremities:  No clubbing, cyanosis, edema, or deformity noted with normal full range of motion of all joints.   Neurologic:  sensation intact to light touch, gait normal, and DTRs symmetrical and normal.   Skin:  Intact without suspicious lesions or rashes Cervical Nodes:  No lymphadenopathy noted Psych:  normal affect, talkative and pleasant  Impression & Recommendations:  Problem # 1:  OBESITY (ICD-278.00) Assessment New  DOT physician recommended sleep apnea eval on pt for risk factor of obesity due to total lack of symptoms -- I do not feel he needs eval for this  rev s/s in detail - pt lacks the fatigue/ loud snoring/ witnessed apnea or day time somnolence form filled out indicating this  if the DOT physician still feels he needs sleep study - will refer  disc plan for wt loss - disc health ramifications and risks of high bmi enc diet and exercise - rev this in detail  Orders: Sleep Disorder Referral (Sleep Disorder)  Problem # 2:  HYPERTENSION (ICD-401.9) Assessment: Unchanged  continues to be well controlled with current med  disc imp of diet and exercise also  His updated  medication list for this problem includes:    Benicar Hct 40-25 Mg Tabs (Olmesartan medoxomil-hctz) .Marland Kitchen... Take one by mouth daily    Lopressor 50 Mg Tabs (Metoprolol tartrate) .Marland Kitchen... 1/2 by mouth two times a day    Hydrochlorothiazide 25 Mg Tabs (Hydrochlorothiazide) .Marland Kitchen... Take one by mouth daily  BP today: 132/90-- re check 125/80 Prior BP: 118/72 (02/01/2009)  Labs Reviewed: K+: 3.9 (08/24/2008) Creat: : 1.2 (08/24/2008)   Chol: 177 (08/24/2008)   HDL: 37.6 (08/24/2008)   LDL: 125 (08/24/2008)   TG: 72 (08/24/2008)  Complete Medication List: 1)  Benicar Hct 40-25 Mg Tabs (Olmesartan medoxomil-hctz) .... Take one by mouth daily 2)  Bayer Aspirin 325 Mg Tabs (Aspirin) .... Take one by mouth daily 3)  Lopressor 50 Mg Tabs (Metoprolol tartrate) .... 1/2 by mouth two times a day 4)  Hydrochlorothiazide 25 Mg Tabs (Hydrochlorothiazide) .... Take one by mouth daily  Patient Instructions: 1)  keep working on healthy diet and exercise for weight loss  2)  if you begin to experience day time fatigue/ loud snoring or gasping at night - please update me   Prior Medications (reviewed today): BENICAR HCT 40-25 MG  TABS (OLMESARTAN MEDOXOMIL-HCTZ) take one by mouth daily BAYER ASPIRIN 325 MG  TABS (ASPIRIN) take one by mouth daily LOPRESSOR 50 MG TABS (METOPROLOL TARTRATE) 1/2 by mouth two times a day HYDROCHLOROTHIAZIDE 25 MG TABS (HYDROCHLOROTHIAZIDE) take one by mouth daily Current Allergies: LOTENSIN

## 2010-09-22 ENCOUNTER — Telehealth: Payer: Self-pay | Admitting: Family Medicine

## 2010-09-23 ENCOUNTER — Telehealth: Payer: Self-pay | Admitting: Family Medicine

## 2010-09-24 ENCOUNTER — Emergency Department (HOSPITAL_COMMUNITY)
Admission: EM | Admit: 2010-09-24 | Discharge: 2010-09-25 | Disposition: A | Discharge status: Home / Self Care | Payer: 59 | Attending: Emergency Medicine | Admitting: Emergency Medicine

## 2010-09-24 DIAGNOSIS — L02419 Cutaneous abscess of limb, unspecified: Secondary | ICD-10-CM | POA: Insufficient documentation

## 2010-09-24 DIAGNOSIS — I10 Essential (primary) hypertension: Secondary | ICD-10-CM | POA: Insufficient documentation

## 2010-09-24 DIAGNOSIS — L03119 Cellulitis of unspecified part of limb: Secondary | ICD-10-CM | POA: Insufficient documentation

## 2010-09-26 ENCOUNTER — Encounter: Payer: Self-pay | Admitting: Family Medicine

## 2010-09-26 ENCOUNTER — Ambulatory Visit (INDEPENDENT_AMBULATORY_CARE_PROVIDER_SITE_OTHER): Payer: 59 | Admitting: Family Medicine

## 2010-09-26 DIAGNOSIS — L02419 Cutaneous abscess of limb, unspecified: Secondary | ICD-10-CM | POA: Insufficient documentation

## 2010-09-26 DIAGNOSIS — L03119 Cellulitis of unspecified part of limb: Secondary | ICD-10-CM

## 2010-09-27 ENCOUNTER — Encounter: Payer: Self-pay | Admitting: Family Medicine

## 2010-09-29 ENCOUNTER — Ambulatory Visit (INDEPENDENT_AMBULATORY_CARE_PROVIDER_SITE_OTHER): Payer: 59 | Admitting: Family Medicine

## 2010-09-29 ENCOUNTER — Encounter: Payer: Self-pay | Admitting: Family Medicine

## 2010-09-29 DIAGNOSIS — L03119 Cellulitis of unspecified part of limb: Secondary | ICD-10-CM

## 2010-10-01 ENCOUNTER — Ambulatory Visit: Payer: 59 | Admitting: Family Medicine

## 2010-10-02 NOTE — Progress Notes (Signed)
Summary: Meloxicam 15mg   Phone Note Refill Request Call back at [phone] Message from:  [id number] on September 22, 2010 11:47 AM  Refills Requested: Medication #1:  MOBIC 15 MG TABS 1 by mouth once daily.   Last Refilled: 02/26/2010 [facility] [phone] electronically requested refill on Meloxicam 15mg . Lat refill date 02/26/10.Please advise.    Method Requested: Electronic Initial call taken by: Lewanda Rife LPN,  September 22, 2010 11:48 AM  Follow-up for Phone Call        px written on EMR for call in schedule f/u with me for may please Follow-up by: Judith Part MD,  September 22, 2010 1:34 PM  Additional Follow-up for Phone Call Additional follow up Details #1::        [facility] as instructed. Pt scheduled appt for f/u 12/25/10 at 8am.[facility] LPN  September 22, 2010 2:48 PM     New/Updated Medications: MOBIC 15 MG TABS (MELOXICAM) 1 by mouth once daily with food as needed Prescriptions: MOBIC 15 MG TABS (MELOXICAM) 1 by mouth once daily with food as needed  #30 x 0   Entered and Authorized by:   Judith Part MD   Signed by:   Judith Part MD on 09/22/2010   Method used:   Telephoned to ...       [facility] (retail)       [address]       [place], [place]  [address]       Ph: [phone] or [phone]       Fax: [phone]   RxID:   [id number]

## 2010-10-02 NOTE — Letter (Signed)
Summary: Out of Work  [facility]  [address] [place], [place] [address]   Phone: [phone]  Fax: [phone]    September 26, 2010   Employee:  Fayrene Fearing A Hollibaugh    To Whom It May Concern:   For Medical reasons, please excuse the above named employee from work for the following dates:  Start:   09/26/2010  End:   can return 10/01/2010 if he is feeling better   If you need additional information, please feel free to contact our office.         Sincerely,    Judith Part MD

## 2010-10-02 NOTE — Progress Notes (Signed)
Summary: [facility] needed [facility]  Phone Note Refill Request Message from:  Fax from Pharmacy  Refills Requested: Medication #1:  MOBIC 15 MG TABS 1 by mouth once daily with food as needed.  Medication #2:  LOPRESSOR 50 MG TABS 1/2 by mouth two times a day  Medication #3:  HYDROCHLOROTHIAZIDE 25 MG TABS take one by mouth daily Faxed forms from [facility] are on your shelf.  They are asking for new scripts.  Initial call taken by: Lowella Petties CMA, AAMA,  September 23, 2010 12:09 PM  Follow-up for Phone Call        form done and in nurse in box  Follow-up by: Judith Part MD,  September 23, 2010 12:37 PM  Additional Follow-up for Phone Call Additional follow up Details #1::        completed forms faxed to [phone].Lewanda Rife LPN  September 23, 2010 1:18 PM     Prescriptions: MOBIC 15 MG TABS (MELOXICAM) 1 by mouth once daily with food as needed  #90 x 3   Entered and Authorized by:   Judith Part MD   Signed by:   Judith Part MD on 09/23/2010   Method used:   Historical   RxID:   [id number] HYDROCHLOROTHIAZIDE 25 MG TABS (HYDROCHLOROTHIAZIDE) take one by mouth daily  #90 x 3   Entered and Authorized by:   Judith Part MD   Signed by:   Judith Part MD on 09/23/2010   Method used:   Historical   RxID:   [id number] LOPRESSOR 50 MG TABS (METOPROLOL TARTRATE) 1/2 by mouth two times a day  #90 x 3   Entered and Authorized by:   Judith Part MD   Signed by:   Judith Part MD on 09/23/2010   Method used:   Historical   RxID:   [id number]

## 2010-10-06 ENCOUNTER — Ambulatory Visit: Payer: 59 | Admitting: Family Medicine

## 2010-10-08 NOTE — Letter (Signed)
Summary: Out of Work  [facility]  [address] [place], [place] [address]   Phone: [phone]  Fax: [phone]    September 29, 2010   Employee:  Fayrene Fearing A [place]    To Whom It May Concern:   For Medical reasons, please excuse the above named employee from work for the following dates:  Start:   09/29/2010  End:   can return 10/02/2010 if he is feeling better   If you need additional information, please feel free to contact our office.         Sincerely,    Judith Part MD

## 2010-10-08 NOTE — Assessment & Plan Note (Signed)
Summary: F/U [facility] ON 09/24/10/CLE DRAINED KNOT ON LEG  [facility]   Vital Signs:  Patient profile:   50 year old male Height:      71 inches Weight:      270 pounds BMI:     37.79 Temp:     99 degrees F oral Pulse rate:   88 / minute Pulse rhythm:   regular BP sitting:   110 / 74  (left arm) Cuff size:   large  Vitals Entered By: Lewanda Rife LPN (September 26, 2010 9:11 AM) CC: f/u [facility] 09/24/10 Drained knot on rt knee. Rt knee still hurts anxd raining. Now pain level is 7.   History of Present Illness: here for f/u after I and D of abcess [facility] 2/8 was packed with iodoform gauze  some discomfort but overall much much better cannot drive/work on pain meds  a little drainage- not much  can move knee with no problem no fever or malaise    Allergies: 1)  Lotensin  Past History:  Past Medical History: Last updated: 10/02/2009   OBESITY (ICD-278.00) CONTACT DERMATITIS&OTHER ECZEMA DUE TO PLANTS (ICD-692.6) HEMATURIA, MICROSCOPIC (ICD-599.7) HYPERLIPIDEMIA (ICD-272.4) Hx of SACROILIITIS (ICD-720.2) CEREBROVASCULAR ACCIDENT, HX OF (ICD-V12.50) HYPERTENSION (ICD-401.9)    Past Surgical History: Last updated: 02/01/2007 Hernia- umbilical Carotid dopplers- neg (11/2006)  Family History: Last updated: 02/02/2007 Father:  Mother: HTN, DM Siblings:  no cancer in family some high chol in family  Social History: Last updated: 10/02/2009 Marital Status: Married Children: 2 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 Eyes:  Denies blurring. CV:  Denies chest pain or discomfort and palpitations. Resp:  Denies cough. GI:  Denies abdominal pain. MS:  Denies muscle aches and cramps. Derm:  Complains of lesion(s); denies rash. Neuro:  Denies numbness and tingling. Heme:  Denies abnormal bruising and  bleeding.  Physical Exam  General:  overweight but generally well appearing  Neck:  supple with full rom and no masses or thyromegally, no JVD or carotid bruit  Lungs:  Normal respiratory effort, chest expands symmetrically. Lungs are clear to auscultation, no crackles or wheezes. Heart:  Normal rate and regular rhythm. S1 and S2 normal without gallop, murmur, click, rub or other extra sounds. Msk:  see skin exam  Extremities:  No clubbing, cyanosis, edema, or deformity noted with normal full range of motion of all joints.   Neurologic:  sensation intact to light touch and gait normal.   Skin:  abcess R lat knee  1-2 cm  erythema 2-3 inches above nl rom knee   gauze removed abcess cleaned and then flushed with sterile water  new iodoform gauze inserted in sterile fashion sterile dresssing applied pt tol swll  Psych:  normal affect, talkative and pleasant    Impression & Recommendations:  Problem # 1:  CELLULITIS AND ABSCESS OF LEG EXCEPT FOOT (ICD-682.6) Assessment New abcess was flushed with sterile saline and re packed with iodoform gauze today pt tolerated well  dressed with sterile gauze and wrap  sample sent for cx (? cannot find if this was done in ER) inst to finish abx f/u early next wk  disc red flags to call for   His updated medication list for this problem includes:    Doxycycline Hyclate 100 Mg Caps (Doxycycline hyclate) ..... One capsule by mouth twice a day for 7  days  Orders: T-Culture, Wound (87070/[id number]) Specimen Handling (16[address])  Complete Medication List: 1)  Benicar Hct 40-25 Mg Tabs (Olmesartan medoxomil-hctz) .... Take one by mouth daily 2)  Bayer Aspirin 325 Mg Tabs (Aspirin) .... Take one by mouth daily 3)  Lopressor 50 Mg Tabs (Metoprolol tartrate) .... 1/2 by mouth two times a day 4)  Hydrochlorothiazide 25 Mg Tabs (Hydrochlorothiazide) .... Take one by mouth daily 5)  Mobic 15 Mg Tabs (Meloxicam) .Marland Kitchen.. 1 by mouth once daily with food as  needed 6)  Doxycycline Hyclate 100 Mg Caps (Doxycycline hyclate) .... One capsule by mouth twice a day for 7 days 7)  Hydrocodone-acetaminophen 5-325 Mg Tabs (Hydrocodone-acetaminophen) .Marland Kitchen.. 1-2 tablets by mouth every 4-6 hours as needed for pain  Patient Instructions: 1)  you can replace gauze and wrapping (or tape )-- whichever is more comfortable 2)  if the packing comes out of wound - don't worry about it  3)  if redness or pain worse- go to [facility] ER 4)  if fever -- same  5)  follow up with me on monday or tuesday 6)  no work while on the pain medicine 7)  continue the antibiotic  Prescriptions: HYDROCODONE-ACETAMINOPHEN 5-325 MG TABS (HYDROCODONE-ACETAMINOPHEN) 1-2 tablets by mouth every 4-6 hours as needed for pain  #60 x 0   Entered and Authorized by:   Judith Part MD   Signed by:   Judith Part MD on 09/26/2010   Method used:   Print then Give to Patient   RxID:   [id number]    Orders Added: 1)  T-Culture, Wound [87070/[id number]] 2)  Specimen Handling [99000] 3)  Est. Patient Level III [95621]    Current Allergies (reviewed today): LOTENSIN

## 2010-10-14 NOTE — Assessment & Plan Note (Signed)
Summary: MONDAY FOLLOW UP/[facility]   Vital Signs:  Patient profile:   50 year old male Height:      71 inches Weight:      271 pounds BMI:     37.93 Temp:     98.1 degrees F oral Pulse rate:   76 / minute Pulse rhythm:   regular BP sitting:   122 / 76  (left arm) Cuff size:   large  Vitals Entered By: Lewanda Rife LPN (September 29, 2010 10:35 AM) CC: Monday f/u of knee   History of Present Illness: here for f/u of his knee infection  overall is better - less red   pain is gradually going away   swelled some after standing and that went back down  no fever   mrsa on cx- sens to doxy  Allergies: 1)  Lotensin  Past History:  Past Medical History: Last updated: 10/02/2009   OBESITY (ICD-278.00) CONTACT DERMATITIS&OTHER ECZEMA DUE TO PLANTS (ICD-692.6) HEMATURIA, MICROSCOPIC (ICD-599.7) HYPERLIPIDEMIA (ICD-272.4) Hx of SACROILIITIS (ICD-720.2) CEREBROVASCULAR ACCIDENT, HX OF (ICD-V12.50) HYPERTENSION (ICD-401.9)    Past Surgical History: Last updated: 02/01/2007 Hernia- umbilical Carotid dopplers- neg (11/2006)  Family History: Last updated: 02/02/2007 Father:  Mother: HTN, DM Siblings:  no cancer in family some high chol in family  Social History: Last updated: 10/02/2009 Marital Status: Married Children: 2 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 CV:  Denies chest pain or discomfort and palpitations. Resp:  Denies cough and wheezing. GI:  Denies abdominal pain, nausea, and vomiting. MS:  Complains of joint pain; denies cramps. Derm:  Complains of lesion(s); denies itching and rash. Neuro:  Denies numbness and tingling. Endo:  Denies excessive thirst and excessive urination. Heme:  Denies abnormal bruising and bleeding.  Physical Exam  General:  overweight but generally well appearing  Mouth:   pharynx pink and moist.   Heart:  Normal rate and regular rhythm. S1 and S2 normal without gallop, murmur, click, rub or other extra sounds. Msk:  full rom R knee without signs of effusion  Pulses:  nl pedal pulse Skin:  R knee abcess 1 cm shallow  much less redness and no swelling cleaned with iodine after removal of gauze sterile drsg with bactroban applied pt tolerated well  Psych:  normal affect, talkative and pleasant    Impression & Recommendations:  Problem # 1:  CELLULITIS AND ABSCESS OF LEG EXCEPT FOOT (ICD-682.6) Assessment Improved  found to be mrsa sens to doxy-- and much improved gauze removed - not re packed due to decrease in size dressed sterilly with bactroban inst in drsg two times a day  work on thurs if better pt advised to update me if symptoms worsen or do not improve - fever or worse pain or redness plan to continue doxy 5 more days f/u 1 wk His updated medication list for this problem includes:    Doxycycline Hyclate 100 Mg Caps (Doxycycline hyclate) ..... One capsule by mouth twice a day for  5 additional days  Orders: Prescription Created Electronically [phone])  Complete Medication List: 1)  Benicar Hct 40-25 Mg Tabs (Olmesartan medoxomil-hctz) .... Take one by mouth daily 2)  Bayer Aspirin 325 Mg Tabs (Aspirin) .... Take one by mouth daily 3)  Lopressor 50 Mg Tabs (Metoprolol tartrate) .... 1/2 by mouth two times a day 4)  Hydrochlorothiazide 25 Mg Tabs (Hydrochlorothiazide) .... Take  one by mouth daily 5)  Mobic 15 Mg Tabs (Meloxicam) .Marland Kitchen.. 1 by mouth once daily with food as needed 6)  Doxycycline Hyclate 100 Mg Caps (Doxycycline hyclate) .... One capsule by mouth twice a day for  5 additional days 7)  Hydrocodone-acetaminophen 5-325 Mg Tabs (Hydrocodone-acetaminophen) .Marland Kitchen.. 1-2 tablets by mouth every 4-6 hours as needed for pain 8)  Bactroban 2 % Oint (Mupirocin) .... Apply to wound two times a day  Patient Instructions: 1)  you can return to work  thursday if you are feeling better 2)  clean wound two times a day with antibacterial soap and water - do not submerge  3)  use bactroban ointment/ gauze and tape  4)  update me asap if fever/ or worse pain or redness 5)  take 5 more days of antibiotic  6)  follow up with me in 1 week for re check  7)  clean household surfaces with lysol or bleach solution  8)  wash hands regularly  Prescriptions: BACTROBAN 2 % OINT (MUPIROCIN) apply to wound two times a day  #1 medium x 1   Entered and Authorized by:   Judith Part MD   Signed by:   Judith Part MD on 09/29/2010   Method used:   [facility]        [id number]       [address]       [place], [place]  [address]       Ph: [phone]       Fax: [phone]   RxID:   [id number] DOXYCYCLINE HYCLATE 100 MG CAPS (DOXYCYCLINE HYCLATE) one capsule by mouth twice a day for  5 additional days  #10 x 0   Entered and Authorized by:   Judith Part MD   Signed by:   Judith Part MD on 09/29/2010   Method used:   Electronically to        [id number]       [address]       [place], [place]  [address]       Ph: [phone]       Fax: [phone]   RxID:   [id number]    Orders Added: 1)  Prescription Created Electronically [G8553] 2)  Est. Patient Level III [73220]    Current Allergies (reviewed today): LOTENSIN

## 2010-10-14 NOTE — Medication Information (Signed)
Summary: Clarification for Met[facility]ol[facility]  Clarification for [facility]oprolol/[facility]   Imported By: Lanelle Bal 10/07/2010 08:27:47  _____________________________________________________________________  External Attachment:    Type:   Image     Comment:   External Document

## 2010-10-16 ENCOUNTER — Ambulatory Visit: Payer: 59 | Admitting: Family Medicine

## 2010-10-27 ENCOUNTER — Ambulatory Visit: Payer: 59 | Admitting: Family Medicine

## 2010-10-31 ENCOUNTER — Encounter: Payer: Self-pay | Admitting: Family Medicine

## 2010-11-15 ENCOUNTER — Other Ambulatory Visit: Payer: Self-pay | Admitting: Family Medicine

## 2010-12-13 ENCOUNTER — Encounter: Payer: Self-pay | Admitting: Family Medicine

## 2010-12-25 ENCOUNTER — Ambulatory Visit: Payer: Self-pay | Admitting: Family Medicine

## 2010-12-26 ENCOUNTER — Ambulatory Visit: Payer: Self-pay | Admitting: Family Medicine

## 2010-12-26 DIAGNOSIS — Z0289 Encounter for other administrative examinations: Secondary | ICD-10-CM

## 2010-12-30 NOTE — Op Note (Signed)
Sean Lee, Sean Lee                ACCOUNT NO.:  [id number]   MEDICAL RECORD NO.:  [id number]          PATIENT TYPE:  AMB   LOCATION:  ENDO                         FACILITY:  [facility]   PHYSICIAN:  Noralyn Pick. Eden Emms, MD, FACCDATE OF BIRTH:  [birth year]   DATE OF PROCEDURE:  12/30/2006  DATE OF DISCHARGE:                               OPERATIVE REPORT   PROCEDURE:  Transesophageal echocardiogram.   INDICATIONS:  A 50 year old patient with question of sensory TIA,  transcranial Doppler suggesting the possibility of small intracardiac  shunts.   The patient was sedated with 125 mcg of fentanyl and 5 mg of Versed.   Using digital technique, an Omniplane probe was advanced into the distal  esophagus without incident.  Transgastric imaging revealed mild LVH,  ejection fraction is normal at 65%.  There was no mural or apical  thrombus.  There was mild left atrial enlargement.  The right-sided  cardiac chambers were normal.  The aortic, tricuspid, pulmonary and  mitral valves were structurally normal without evidence of SBE or mass.   There was no ASD or VSD by 2-D and color-flow.  There was no PFO.   There was no left atrial appendage thrombus.   The patient had a bubble study.  There was no evidence of a cardiac  shunt; however, there were very small amount of tiny bubbles that  traversed after 7-8 cardiac cycles.  This would indicate the possibility  of a very trivial and small pulmonary shunt.   This should not be a source of embolus.   Imaging of the aorta showed no significant debris.   FINAL IMPRESSION:  1. Mild left ventricular hypertrophy, ejection fraction 65%.  2. No atrial septal defect, ventricular septal defect or patent      foramen ovale.  3. Normal cardiac valves.  4. No aortic debris.  5. No left atrial appendage thrombus.  6. Fairly late and very tiny amount of positive bubble contrast going      right to left, suggesting possibility of a tiny pulmonary shunt.  This should not be clinically significant in regard to his sensory      transient ischemic attacks.   The patient tolerated the procedure well.      Noralyn Pick. Eden Emms, MD, [facility]     PCN/MEDQ  D:  12/30/2006  T:  12/30/2006  Job:  [id number]   cc:   Echo Lab  Pramod P. Pearlean Brownie, MD  Audrie Gallus. Milinda Antis, MD

## 2011-02-05 ENCOUNTER — Encounter: Payer: Self-pay | Admitting: Cardiovascular Disease

## 2011-03-09 ENCOUNTER — Ambulatory Visit: Payer: 59 | Admitting: Family Medicine

## 2011-06-08 ENCOUNTER — Ambulatory Visit (INDEPENDENT_AMBULATORY_CARE_PROVIDER_SITE_OTHER)
Admission: RE | Admit: 2011-06-08 | Discharge: 2011-06-08 | Disposition: A | Discharge status: Home / Self Care | Payer: BC Managed Care – PPO | Source: Ambulatory Visit | Attending: Family Medicine | Admitting: Family Medicine

## 2011-06-08 ENCOUNTER — Encounter: Payer: Self-pay | Admitting: Family Medicine

## 2011-06-08 ENCOUNTER — Ambulatory Visit (INDEPENDENT_AMBULATORY_CARE_PROVIDER_SITE_OTHER): Payer: BC Managed Care – PPO | Admitting: Family Medicine

## 2011-06-08 VITALS — BP 138/90 | HR 68 | Temp 98.4°F | Wt 276.2 lb

## 2011-06-08 DIAGNOSIS — M79609 Pain in unspecified limb: Secondary | ICD-10-CM

## 2011-06-08 DIAGNOSIS — M79602 Pain in left arm: Secondary | ICD-10-CM

## 2011-06-08 LAB — CBC WITH DIFFERENTIAL/PLATELET
Eosinophils Relative: 0.8 % (ref 0.0–5.0)
Lymphocytes Relative: 26.4 % (ref 12.0–46.0)
MCV: 96.3 fl (ref 78.0–100.0)
Monocytes Absolute: 0.8 10*3/uL (ref 0.1–1.0)
Neutrophils Relative %: 64.7 % (ref 43.0–77.0)
Platelets: 295 10*3/uL (ref 150.0–400.0)
WBC: 10.1 10*3/uL (ref 4.5–10.5)

## 2011-06-08 LAB — URIC ACID: Uric Acid, Serum: 8.6 mg/dL — ABNORMAL HIGH (ref 4.0–7.8)

## 2011-06-08 MED ORDER — NAPROXEN 500 MG PO TABS: ORAL_TABLET | ORAL | Status: DC

## 2011-06-08 NOTE — Progress Notes (Signed)
  Subjective:    Patient ID: Sean Lee, male    DOB: Aug 20, 1960, 50 y.o.   MRN: [id number]  HPI CC: left shoulder pain, finger pain  Going on for a while - 2 months.  Started with shoulder pain down to elbow and now spread to left hand mainly ring finger, fingers "lock up", constant pain, worse in AM and with making fists.  Pain impedes ability to make fist.  + radicular pain down left side.  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  Mild numbness worse at night.  Grip strength weak mainly from pain.  Denies inciting event/injury.  Endorses worse/more stiff first thing in am for left hand / finger pain, then as day progresses improves but never pain free.  So far has tried advil which didn't help.  No neck pain, fevers/chills, other muscle/joint pains.  No new rashes.  No pain in eyes, oral lesions.  Never had joint pains in past.  H/o MRSA infection R knee summer 2012.  Physical work, lifting.  Trouble doing this 2/2 pain.  No fmhx arthritis or other joint problems.  No h/o back pain or neck pain in past.  Endorses swelling of left 4th finger, unable to place ring on finger.  Review of Systems Per HPI    Objective:   Physical Exam  Nursing note and vitals reviewed. Constitutional: He appears well-developed and well-nourished. No distress.  HENT:  Head: Normocephalic and atraumatic.  Musculoskeletal:       FROM at neck and shoulders. No midline spine tenderness, no paraspinous mm tenderness No shoulder deformity. Tender to testing external rotation against resistance and empty can test left side but tenderness localized to lateral upper arm. Neg seer test. Grip strength intact. Tender to palpation 4th MCP joint but no erythema, swelling of joint. 4th right hand with trigger finger.  Neurological: He has normal strength. No sensory deficit.  Reflex Scores:      Bicep reflexes are 2+ on the right side and 2+ on the left side. Skin: Skin is warm and  dry. No rash noted.  Psychiatric: He has a normal mood and affect.      Assessment & Plan:

## 2011-06-08 NOTE — Patient Instructions (Addendum)
i'm not quite sure what's causing this pain. I think there are two separate things today - arthritis of the ring finger knuckle joint along with some inflammation of shoulder. Xray today - overall looking ok.  If any chang based on radiology read, we will call you. I'd like to check blood work today to check for more common causes of arthritis (gout, infection). Treat pain with anti inflammatory regularly for next week (with food).   If any changing or worsening, please return sooner.  Otherwise return in 2-3 weeks for follow up as needed.

## 2011-06-08 NOTE — Assessment & Plan Note (Signed)
Anticipate 2 separate issues: 1. MCP inflammation, ?arthritis although isolated monoarticular.  Check uric acid, CBC for infection, and checked xray today - preserved joint space on my read.  Await radiology read.  Treat with NSAID.   2. Shoulder/arm pain - left lateral arm.  ?cervical radiculopathy vs RTC tendinopathy.  Treat with NSAID, ice/heat. If any worsening or changing of sxs, or not improving as expected, return for f/u.  Consider further arthritis w/u including RF.

## 2011-06-29 ENCOUNTER — Other Ambulatory Visit: Payer: Self-pay | Admitting: Family Medicine

## 2011-08-26 ENCOUNTER — Other Ambulatory Visit: Payer: Self-pay | Admitting: *Deleted

## 2011-08-26 MED ORDER — OLMESARTAN MEDOXOMIL-HCTZ 40-25 MG PO TABS: 1.0000 | ORAL_TABLET | Freq: Every day | ORAL | Status: DC

## 2011-08-26 NOTE — Telephone Encounter (Signed)
Patient requested 90 day supply for insurance purposes.

## 2011-09-20 ENCOUNTER — Other Ambulatory Visit: Payer: Self-pay | Admitting: Family Medicine

## 2011-09-21 ENCOUNTER — Other Ambulatory Visit: Payer: Self-pay

## 2011-09-21 NOTE — Telephone Encounter (Signed)
He was given this for hand/ arm pain - and refil was requested - please ask him if he needs this / if still having pain, thanks

## 2011-09-21 NOTE — Telephone Encounter (Signed)
[facility] faxed refill request Naproxen 500 mg. Pt last seen by Dr Sharen Hones 06/08/11 for left arm pain. Please advise.

## 2011-09-22 ENCOUNTER — Other Ambulatory Visit: Payer: Self-pay | Admitting: *Deleted

## 2011-09-22 NOTE — Telephone Encounter (Signed)
Opened in error

## 2011-09-23 NOTE — Telephone Encounter (Signed)
Pt said it is similar pain in left hand and arm that he had when saw Dr Reece Agar. Pt said he thinks the way he sleeps may have something to do with his problem. Pt said when he lays on the left side and he wakes up he has stiffness and tingling in the left hand along with some pain. After taking Naprosyn he does not have pain, stiffness is gone. Pt only uses occasionally.Please advise.

## 2011-09-24 MED ORDER — NAPROXEN 500 MG PO TABS: ORAL_TABLET | ORAL | Status: AC

## 2011-09-24 NOTE — Telephone Encounter (Signed)
Patient notified as instructed by telephone. Pt said pain just once in a while. Pt said if symptoms worsen or become more frequent pt will let Dr Milinda Antis know.

## 2011-09-24 NOTE — Telephone Encounter (Signed)
Left vm for pt to callback 

## 2011-09-24 NOTE — Telephone Encounter (Signed)
Ok- if pain all the time - need to get him referred , but if once in a while, ok  Will refill electronically

## 2012-01-29 ENCOUNTER — Other Ambulatory Visit: Payer: Self-pay | Admitting: Family Medicine

## 2012-01-29 NOTE — Telephone Encounter (Signed)
[facility] request benicar HCT # 90 x 0 with note need to call for appt.

## 2012-02-06 ENCOUNTER — Other Ambulatory Visit: Payer: Self-pay | Admitting: Family Medicine

## 2012-02-29 ENCOUNTER — Other Ambulatory Visit: Payer: Self-pay | Admitting: Family Medicine

## 2012-03-01 NOTE — Telephone Encounter (Signed)
Received refill request electronically from pharmacy.  Patient has not seen you in over a year. Is it okay to refill medication?

## 2012-03-01 NOTE — Telephone Encounter (Signed)
Left message for patient to call to schedule an appointment for F/U

## 2012-03-01 NOTE — Telephone Encounter (Signed)
Will refill electronically Please schedule appt if not done already, thanks

## 2012-03-21 ENCOUNTER — Ambulatory Visit (INDEPENDENT_AMBULATORY_CARE_PROVIDER_SITE_OTHER): Payer: BC Managed Care – PPO | Admitting: Family Medicine

## 2012-03-21 ENCOUNTER — Encounter: Payer: Self-pay | Admitting: Family Medicine

## 2012-03-21 VITALS — BP 130/84 | HR 87 | Temp 98.4°F | Wt 278.0 lb

## 2012-03-21 DIAGNOSIS — I8390 Asymptomatic varicose veins of unspecified lower extremity: Secondary | ICD-10-CM

## 2012-03-21 DIAGNOSIS — I10 Essential (primary) hypertension: Secondary | ICD-10-CM

## 2012-03-21 DIAGNOSIS — E785 Hyperlipidemia, unspecified: Secondary | ICD-10-CM

## 2012-03-21 DIAGNOSIS — M6208 Separation of muscle (nontraumatic), other site: Secondary | ICD-10-CM

## 2012-03-21 DIAGNOSIS — M6200 Separation of muscle (nontraumatic), unspecified site: Secondary | ICD-10-CM

## 2012-03-21 LAB — COMPREHENSIVE METABOLIC PANEL
Alkaline Phosphatase: 51 U/L (ref 39–117)
BUN: 17 mg/dL (ref 6–23)
Glucose, Bld: 112 mg/dL — ABNORMAL HIGH (ref 70–99)
Sodium: 136 mEq/L (ref 135–145)
Total Bilirubin: 0.9 mg/dL (ref 0.3–1.2)
Total Protein: 7.5 g/dL (ref 6.0–8.3)

## 2012-03-21 LAB — CBC WITH DIFFERENTIAL/PLATELET
Eosinophils Relative: 0.8 % (ref 0.0–5.0)
HCT: 43.4 % (ref 39.0–52.0)
Lymphs Abs: 2.8 10*3/uL (ref 0.7–4.0)
MCV: 96.8 fl (ref 78.0–100.0)
Monocytes Absolute: 1 10*3/uL (ref 0.1–1.0)
Platelets: 294 10*3/uL (ref 150.0–400.0)
WBC: 9.4 10*3/uL (ref 4.5–10.5)

## 2012-03-21 LAB — TSH: TSH: 0.9 u[IU]/mL (ref 0.35–5.50)

## 2012-03-21 LAB — LIPID PANEL
Cholesterol: 141 mg/dL (ref 0–200)
HDL: 43.5 mg/dL (ref >39.00)
Triglycerides: 91 mg/dL (ref 0.0–149.0)
VLDL: 18.2 mg/dL (ref 0.0–40.0)

## 2012-03-21 NOTE — Assessment & Plan Note (Signed)
No pain - but noticeable bulge when using abd muscles Old hernia incision app to be intact Disc need for wt loss Will continue to watch

## 2012-03-21 NOTE — Patient Instructions (Addendum)
If the abdominal issue worsens let me know and work on weight loss Labs today We will do a vein clinic referral at check out

## 2012-03-21 NOTE — Assessment & Plan Note (Signed)
Worse in L leg but more swelling in R ankle  Desires ref to vein clinic before trying supp hose  Will do ref  Disc imp of wt loss

## 2012-03-21 NOTE — Assessment & Plan Note (Signed)
Lab today Diet better since his trip in April  Disc goals Disc low sat fat diet

## 2012-03-21 NOTE — Progress Notes (Signed)
Subjective:    Patient ID: Sean Lee, male    DOB: 06-May-1961, 51 y.o.   MRN: [id number]  HPI Here for f/u of chronic problems and also for stomach issues  Wt is up 2 lb with bmi of 38 Lost some and gained it back  Was walking on treadmill- then went on vacation-- and needs to get back to exercise Job is very intense right now   bp is ok    Today BP Readings from Last 3 Encounters:  03/21/12 130/84  06/08/11 138/90  09/29/10 122/76    No cp or palpitations or headaches or edema  No side effects to medicines    Stomach issues  Has a knot he can see when he sits up  ? Related to his hernia surgery in past - several years ago  Is above navel  Not painful About a month   Also has significant varicose veins on L leg  Interested in vein clinic ref His R ankle swells also at times Has very flat feet   Has hyperlipidemia  Lab Results  Component Value Date   CHOL 177 08/24/2008   CHOL 128 02/02/2007   Lab Results  Component Value Date   HDL 37.6* 08/24/2008   HDL 47.6 02/02/2007   Lab Results  Component Value Date   LDLCALC 125* 08/24/2008   LDLCALC 65 02/02/2007   Lab Results  Component Value Date   TRIG 72 08/24/2008   TRIG 77 02/02/2007   Lab Results  Component Value Date   CHOLHDL 4.7 CALC 08/24/2008   CHOLHDL 2.7 CALC 02/02/2007   No results found for this basename: LDLDIRECT   is eating a lot more fruit/ veg than he was on vacation   Patient Active Problem List  Diagnosis  . HYPERLIPIDEMIA  . OBESITY  . OBSTRUCTIVE SLEEP APNEA  . HYPERTENSION  . HEMATURIA, MICROSCOPIC  . SACROILIITIS  . CEREBROVASCULAR ACCIDENT, HX OF  . CELLULITIS AND ABSCESS OF LEG EXCEPT FOOT  . Left arm pain   Past Medical History  Diagnosis Date  . Obesity, unspecified   . Contact dermatitis and other eczema due to plants (except food)   . Other and unspecified hyperlipidemia   . Sacroiliitis, not elsewhere classified   . H/O: CVA (cardiovascular accident)     Carotid doppler  neg (4/08)  . Unspecified essential hypertension   . Hematuria    Past Surgical History  Procedure Date  . Hernia repair     umbilical  . Carotid dopplers 4/08    Negative   History  Substance Use Topics  . Smoking status: Never Smoker   . Smokeless tobacco: Never Used  . Alcohol Use: Yes     occassionally   Family History  Problem Relation Age of Onset  . Hypertension Mother   . Diabetes Mother   . Hyperlipidemia      in family   . Cancer Neg Hx    Allergies  Allergen Reactions  . Benazepril Hcl     REACTION: cough   Current Outpatient Prescriptions on File Prior to Visit  Medication Sig Dispense Refill  . aspirin 325 MG tablet Take 325 mg by mouth daily.        Marland Kitchen BENICAR HCT 40-25 MG per tablet TAKE 1 TABLET EVERY DAY  90 tablet  0  . hydrochlorothiazide 25 MG tablet TAKE ONE TABLET BY MOUTH DAILY  30 tablet  0  . metoprolol (LOPRESSOR) 50 MG tablet TAKE 1/2 TABLET BY MOUTH  TWO TIMES A DAY  30 tablet  1  . naproxen (NAPROSYN) 500 MG tablet Take one by mouth up to twice daily with food as needed  60 tablet  1         Review of Systems Review of Systems  Constitutional: Negative for fever, appetite change, fatigue and unexpected weight change.  Eyes: Negative for pain and visual disturbance.  Respiratory: Negative for cough and shortness of breath.   Cardiovascular: Negative for cp or palpitations   pos for varicose veins  Gastrointestinal: Negative for nausea, diarrhea and constipation.  Genitourinary: Negative for urgency and frequency.  Skin: Negative for pallor or rash   Neurological: Negative for weakness, light-headedness, numbness and headaches.  Hematological: Negative for adenopathy. Does not bruise/bleed easily.  Psychiatric/Behavioral: Negative for dysphoric mood. The patient is not nervous/anxious.         Objective:   Physical Exam  Constitutional: He appears well-developed and well-nourished. No distress.       Obese and well appearing     HENT:  Head: Normocephalic and atraumatic.  Mouth/Throat: Oropharynx is clear and moist.  Eyes: Conjunctivae and EOM are normal. Pupils are equal, round, and reactive to light. No scleral icterus.  Neck: Normal range of motion. Neck supple. No JVD present. Carotid bruit is not present. No thyromegaly present.  Cardiovascular: Normal rate, regular rhythm, normal heart sounds and intact distal pulses.  Exam reveals no gallop.   Pulmonary/Chest: Effort normal and breath sounds normal. No respiratory distress. He has no wheezes.  Abdominal: Soft. Bowel sounds are normal. He exhibits no distension, no abdominal bruit and no mass. There is no tenderness.       Prominent rectus diastasis-noted when sitting up  Non tender and no hernia detected Old hernia scar intact    Musculoskeletal: He exhibits no edema.       Diffuse compressible varicosities noted with some skin hyperpigmentation R ankle mild edema  Lymphadenopathy:    He has no cervical adenopathy.  Neurological: He is alert. He has normal reflexes. He exhibits normal muscle tone. Coordination normal.  Skin: Skin is warm and dry. No rash noted. No erythema. No pallor.  Psychiatric: He has a normal mood and affect.          Assessment & Plan:

## 2012-03-21 NOTE — Assessment & Plan Note (Signed)
bp in fair control at this time  No changes needed  Disc lifstyle change with low sodium diet and exercise   Labs today 

## 2012-06-19 ENCOUNTER — Other Ambulatory Visit: Payer: Self-pay | Admitting: Family Medicine

## 2012-07-11 ENCOUNTER — Other Ambulatory Visit: Payer: Self-pay | Admitting: *Deleted

## 2012-07-11 MED ORDER — METOPROLOL TARTRATE 50 MG PO TABS: 25.0000 mg | ORAL_TABLET | Freq: Two times a day (BID) | ORAL | Status: DC

## 2012-10-17 ENCOUNTER — Telehealth: Payer: Self-pay | Admitting: Family Medicine

## 2012-10-17 NOTE — Telephone Encounter (Signed)
Pt stated that he had an OV a few months ago and Dr. Milinda Antis suggested arch supports.  He has tried to get these but they are very expensive.  He would like something stating that it is medically necessary that he has these supports in order to get insurance to reimburse him.  He said if he needs an OV for this, he is available for appts on Mondays only.

## 2012-10-17 NOTE — Telephone Encounter (Signed)
Left voicemail letting pt know letter ready for pick-up 

## 2012-10-17 NOTE — Telephone Encounter (Signed)
Letter is in IN box 

## 2012-12-12 ENCOUNTER — Ambulatory Visit: Payer: BC Managed Care – PPO | Admitting: Family Medicine

## 2012-12-26 ENCOUNTER — Ambulatory Visit: Payer: BC Managed Care – PPO | Admitting: Family Medicine

## 2013-01-02 ENCOUNTER — Encounter: Payer: Self-pay | Admitting: Family Medicine

## 2013-01-02 ENCOUNTER — Ambulatory Visit (INDEPENDENT_AMBULATORY_CARE_PROVIDER_SITE_OTHER): Payer: BC Managed Care – PPO | Admitting: Family Medicine

## 2013-01-02 VITALS — BP 136/82 | HR 88 | Temp 98.3°F | Ht 74.0 in | Wt 285.5 lb

## 2013-01-02 DIAGNOSIS — E669 Obesity, unspecified: Secondary | ICD-10-CM

## 2013-01-02 DIAGNOSIS — R7303 Prediabetes: Secondary | ICD-10-CM | POA: Insufficient documentation

## 2013-01-02 DIAGNOSIS — I10 Essential (primary) hypertension: Secondary | ICD-10-CM

## 2013-01-02 DIAGNOSIS — R739 Hyperglycemia, unspecified: Secondary | ICD-10-CM

## 2013-01-02 DIAGNOSIS — R7309 Other abnormal glucose: Secondary | ICD-10-CM

## 2013-01-02 DIAGNOSIS — G4733 Obstructive sleep apnea (adult) (pediatric): Secondary | ICD-10-CM

## 2013-01-02 NOTE — Assessment & Plan Note (Signed)
Discussed how this problem influences overall health and the risks it imposes  Reviewed plan for weight loss with lower calorie diet (via better food choices and also portion control or program like weight watchers) and exercise building up to or more than 30 minutes 5 days per week including some aerobic activity    

## 2013-01-02 NOTE — Assessment & Plan Note (Signed)
Pt had some sugar in urine at DOT physical and is obese A1C today

## 2013-01-02 NOTE — Assessment & Plan Note (Signed)
bp lower today than at DOT Will send note BP: 136/82 mmHg

## 2013-01-02 NOTE — Assessment & Plan Note (Signed)
Needs to f/u with pulmonary-not using cpap and there was some concern re: DOT physical  Will refer  Disc imp of wt loss

## 2013-01-02 NOTE — Patient Instructions (Addendum)
Stop at check out to get your referral for sleep apnea  Lab today  Work on exercise 5 days per week and also less calories/ less sugar in diet - with goal of weight loss  Blood pressure was improved today from your DOT physical

## 2013-01-02 NOTE — Progress Notes (Signed)
Subjective:    Patient ID: Sean Lee, male    DOB: [birth year], 52 y.o.   MRN: [id number]  HPI Here for f/u of issues detected in DOT physical   Pt had some positive answers to his ESS question panel and about snoring (1-2 times per week)- with hx of HTN and also obesity (at that visit neck circ is 18.5 and his bmi was 38.5)  Here today his wt is up 7 lb and bmi is 36  He has no history of falling asleep at the wheel   bp is stable today  No cp or palpitations or headaches or edema  No side effects to medicines  BP Readings from Last 3 Encounters:  01/02/13 136/82  03/21/12 130/84  06/08/11 138/90     bp at the DOT was 140/88   Was given his card from now until July He has a cpap machine - and last eval for sleep apnea - was in [place] off [address]  Has seen Dr Shelle Iron in the past   He had sugar in urine - he drank a whole big glass of cool aid - thinks that is the reason   He really does want to loose weight  Is riding bike - at least 30 minutes twice per week  Walking on off days -- 2 days per week at least 1 hour  Is going to stop eating late at night  Smaller more frequent meals instead of big meals He eats a lot of fruit -- ? Too much sugar   Patient Active Problem List   Diagnosis Date Noted  . Varicose veins 03/21/2012  . Rectus diastasis of lower abdomen 03/21/2012  . Left arm pain 06/08/2011  . CELLULITIS AND ABSCESS OF LEG EXCEPT FOOT 09/26/2010  . OBSTRUCTIVE SLEEP APNEA 10/15/2009  . OBESITY 09/02/2009  . HYPERLIPIDEMIA 02/02/2007  . HEMATURIA, MICROSCOPIC 02/02/2007  . HYPERTENSION 02/01/2007  . SACROILIITIS 02/01/2007  . CEREBROVASCULAR ACCIDENT, HX OF 02/01/2007   Past Medical History  Diagnosis Date  . Obesity, unspecified   . Contact dermatitis and other eczema due to plants (except food)   . Other and unspecified hyperlipidemia   . Sacroiliitis, not elsewhere classified   . H/O: CVA (cardiovascular accident)     Carotid doppler  neg (4/08)  . Unspecified essential hypertension   . Hematuria    Past Surgical History  Procedure Laterality Date  . Hernia repair      umbilical  . Carotid dopplers  4/08    Negative   History  Substance Use Topics  . Smoking status: Never Smoker   . Smokeless tobacco: Never Used  . Alcohol Use: Yes     Comment: occassionally   Family History  Problem Relation Age of Onset  . Hypertension Mother   . Diabetes Mother   . Hyperlipidemia      in family   . Cancer Neg Hx    Allergies  Allergen Reactions  . Benazepril Hcl     REACTION: cough   Current Outpatient Prescriptions on File Prior to Visit  Medication Sig Dispense Refill  . aspirin 325 MG tablet Take 325 mg by mouth daily.        Marland Kitchen BENICAR HCT 40-25 MG per tablet TAKE 1 TABLET BY MOUTH EVERY DAY  90 tablet  1  . hydrochlorothiazide 25 MG tablet TAKE ONE TABLET BY MOUTH DAILY  30 tablet  0  . metoprolol (LOPRESSOR) 50 MG tablet Take 0.5 tablets (25 mg  total) by mouth 2 (two) times daily.  90 tablet  1   No current facility-administered medications on file prior to visit.     Review of Systems Review of Systems  Constitutional: Negative for fever, appetite change,  and unexpected weight change. pos for fatigue Eyes: Negative for pain and visual disturbance.  Respiratory: Negative for cough and shortness of breath.   Cardiovascular: Negative for cp or palpitations    Gastrointestinal: Negative for nausea, diarrhea and constipation.  Genitourinary: Negative for urgency and frequency. neg for excessive thirst  Skin: Negative for pallor or rash   Neurological: Negative for weakness, light-headedness, numbness and headaches.  Hematological: Negative for adenopathy. Does not bruise/bleed easily.  Psychiatric/Behavioral: Negative for dysphoric mood. The patient is not nervous/anxious.         Objective:   Physical Exam  Constitutional: He appears well-developed and well-nourished. No distress.  obese and well  appearing   HENT:  Head: Normocephalic and atraumatic.  Right Ear: External ear normal.  Left Ear: External ear normal.  Nose: Nose normal.  Mouth/Throat: Oropharynx is clear and moist.  Eyes: Conjunctivae and EOM are normal. Pupils are equal, round, and reactive to light. Right eye exhibits no discharge. Left eye exhibits no discharge. No scleral icterus.  Neck: Normal range of motion. Neck supple. No JVD present. Carotid bruit is not present. No thyromegaly present.  Cardiovascular: Normal rate, regular rhythm, normal heart sounds and intact distal pulses.  Exam reveals no gallop.   Pulmonary/Chest: Effort normal and breath sounds normal. No respiratory distress. He has no wheezes.  Abdominal: Soft. Bowel sounds are normal. He exhibits no distension, no abdominal bruit and no mass. There is no tenderness.  Musculoskeletal: He exhibits no edema and no tenderness.  Lymphadenopathy:    He has no cervical adenopathy.  Neurological: He is alert. He has normal reflexes. He displays no tremor. No cranial nerve deficit. He exhibits normal muscle tone. Coordination normal.  Skin: Skin is warm and dry. No rash noted. No erythema. No pallor.  Psychiatric: He has a normal mood and affect.          Assessment & Plan:

## 2013-01-23 ENCOUNTER — Ambulatory Visit (INDEPENDENT_AMBULATORY_CARE_PROVIDER_SITE_OTHER): Payer: BC Managed Care – PPO | Admitting: Family Medicine

## 2013-01-23 DIAGNOSIS — R7309 Other abnormal glucose: Secondary | ICD-10-CM

## 2013-01-23 DIAGNOSIS — Z0289 Encounter for other administrative examinations: Secondary | ICD-10-CM

## 2013-01-23 NOTE — Progress Notes (Signed)
  Subjective:    Patient ID: Sean Lee, male    DOB: 07/24/1961, 52 y.o.   MRN: [id number]  HPI Opened in error    Review of Systems     Objective:   Physical Exam        Assessment & Plan:

## 2013-01-24 ENCOUNTER — Telehealth: Payer: Self-pay | Admitting: Pulmonary Disease

## 2013-01-24 NOTE — Telephone Encounter (Signed)
appt set, pt is aware.Carron Curie, CMA

## 2013-01-24 NOTE — Telephone Encounter (Signed)
Pt was scheduled to see [facility] on 01-30-13 at 9:45 am for sleep consult but office is being cancelled that day. The pt is upset because he needs this sleep consult for his job. 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 I have looked through sleep doc schedules and there are no available appts. I spoke with Florentina Addison about some hold slots on CY schedule and she advised to send the message to ask about work in availability. Dr. Maple Hudson would it be ok to schedule this pt in your 11am slot on 01-30-13? Please advise. Carron Curie, CMA

## 2013-01-24 NOTE — Telephone Encounter (Signed)
Per CY-yes this is fine to add patient as new consult in 11 am slot on 01-30-13. Thanks.

## 2013-01-30 ENCOUNTER — Institutional Professional Consult (permissible substitution): Payer: BC Managed Care – PPO | Admitting: Pulmonary Disease

## 2013-01-30 ENCOUNTER — Encounter: Payer: Self-pay | Admitting: Internal Medicine

## 2013-01-30 ENCOUNTER — Telehealth: Payer: Self-pay | Admitting: *Deleted

## 2013-01-30 ENCOUNTER — Ambulatory Visit (INDEPENDENT_AMBULATORY_CARE_PROVIDER_SITE_OTHER): Payer: BC Managed Care – PPO | Admitting: Internal Medicine

## 2013-01-30 VITALS — BP 150/88 | HR 105 | Ht 73.0 in | Wt 286.6 lb

## 2013-01-30 DIAGNOSIS — G4733 Obstructive sleep apnea (adult) (pediatric): Secondary | ICD-10-CM

## 2013-01-30 NOTE — Patient Instructions (Addendum)
Order- unattended home sleep study    Dx OSA  Please call as needed

## 2013-01-30 NOTE — Progress Notes (Signed)
01/30/13- 52 yom never smoker referred courtesy of Dr Milinda Antis for Sleep Medicine consultation. Had NPSG several years ago-not using CPAP. Now needs renewal of DOT CDL. Their screen including measurement of neck size required sleep apnea be ruled out now. He is aware of snoring some but his wife does not tell him of apnea. 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 Bedtime 8:30 PM, sleep latency 40 minutes, waking once before up at 3 AM. Gained 20 pounds in the last 2 years. Denies heart or lung problems but treated for hypertension. No ENT surgery, no history of nasal fracture  Prior to Admission medications   Medication Sig Start Date End Date Taking? Authorizing Provider  aspirin 325 MG tablet Take 325 mg by mouth daily.     Yes Historical Provider, MD  BENICAR HCT 40-25 MG per tablet TAKE 1 TABLET BY MOUTH EVERY DAY 06/19/12  Yes Judy Pimple, MD  hydrochlorothiazide 25 MG tablet TAKE ONE TABLET BY MOUTH DAILY 11/15/10  Yes Judy Pimple, MD  metoprolol (LOPRESSOR) 50 MG tablet Take 0.5 tablets (25 mg total) by mouth 2 (two) times daily. 07/11/12  Yes Judy Pimple, MD   Past Medical History  Diagnosis Date  . Obesity, unspecified   . Contact dermatitis and other eczema due to plants (except food)   . Other and unspecified hyperlipidemia   . Sacroiliitis, not elsewhere classified   . H/O: CVA (cardiovascular accident)     Carotid doppler neg (4/08)  . Unspecified essential hypertension   . Hematuria    Past Surgical History  Procedure Laterality Date  . Hernia repair      umbilical  . Carotid dopplers  4/08    Negative   Family History  Problem Relation Age of Onset  . Hypertension Mother   . Diabetes Mother   . Hyperlipidemia      in family   . Cancer Neg Hx   ' History   Social History  . Marital Status: Married    Spouse Name: N/A    Number of Children: 2  . Years of Education: N/A   Occupational History  . Truck  Public librarian   Social History Main Topics  . Smoking status: Never Smoker   . Smokeless tobacco: Never Used  . Alcohol Use: Yes     Comment: occassionally  . Drug Use: No  . Sexually Active: Not on file   Other Topics Concern  . Not on file   Social History Narrative  . No narrative on file   ROS-see HPI Constitutional:   No-   weight loss, night sweats, fevers, chills, fatigue, lassitude. HEENT:   No-  headaches, difficulty swallowing, tooth/dental problems, sore throat,       No-  sneezing, itching, ear ache, nasal congestion, post nasal drip,  CV:  No-   chest pain, orthopnea, PND, swelling in lower extremities, anasarca,                                  dizziness, palpitations Resp: No-   shortness of breath with exertion or at rest.              No-   productive cough,  No non-productive cough,  No- coughing up of blood.              No-   change in color of  mucus.  No- wheezing.   Skin: No-   rash or lesions. GI:  No-   heartburn, indigestion, abdominal pain, nausea, vomiting, diarrhea,                 change in bowel habits, loss of appetite GU: No-   dysuria, change in color of urine, no urgency or frequency.  No- flank pain. MS:  No-   joint pain or swelling.  No- decreased range of motion.  No- back pain. Neuro-     nothing unusual Psych:  No- change in mood or affect. No depression or anxiety.  No memory loss.  OBJ- Physical Exam General- Alert, Oriented, Affect-appropriate, Distress- none acute, overweight, alert Skin- rash-none, lesions- none, excoriation- none Lymphadenopathy- none Head- atraumatic            Eyes- Gross vision intact, PERRLA, conjunctivae and secretions clear            Ears- Hearing, canals-normal            Nose- Clear, no-Septal dev, mucus, polyps, erosion, perforation             Throat- Mallampati III-IV , mucosa clear , drainage- none, tonsils- atrophic Neck- flexible , trachea midline, no stridor , thyroid nl, carotid no bruit Chest -  symmetrical excursion , unlabored           Heart/CV- RRR , no murmur , no gallop  , no rub, nl s1 s2                           - JVD- none , edema- none, stasis changes- none, varices- none           Lung- clear to P&A, wheeze- none, cough- none , dullness-none, rub- none           Chest wall-  Abd- tender-no, distended-no, bowel sounds-present, HSM- no Br/ Gen/ Rectal- Not done, not indicated Extrem- cyanosis- none, clubbing, none, atrophy- none, strength- nl Neuro- grossly intact to observation

## 2013-01-30 NOTE — Telephone Encounter (Signed)
Pt was transferred to me from front desk to request the no show fee from last week's appt be waived, I advised I don't handle that but would send the request to you, pt was called into work last week and he thought his appt was today, pt called today thinking he could cancel his appt for today because he was called into work today too, I advise him appt was last week, and he didn't show, pt request call back from you

## 2013-02-10 ENCOUNTER — Ambulatory Visit: Payer: BC Managed Care – PPO | Admitting: Internal Medicine

## 2013-02-10 DIAGNOSIS — G4733 Obstructive sleep apnea (adult) (pediatric): Secondary | ICD-10-CM

## 2013-02-11 DIAGNOSIS — R0609 Other forms of dyspnea: Secondary | ICD-10-CM

## 2013-02-11 DIAGNOSIS — G4710 Hypersomnia, unspecified: Secondary | ICD-10-CM

## 2013-02-13 ENCOUNTER — Encounter: Payer: Self-pay | Admitting: Family Medicine

## 2013-02-13 ENCOUNTER — Ambulatory Visit (INDEPENDENT_AMBULATORY_CARE_PROVIDER_SITE_OTHER): Payer: BC Managed Care – PPO | Admitting: Family Medicine

## 2013-02-13 VITALS — BP 122/86 | HR 80 | Temp 98.7°F | Ht 73.0 in | Wt 284.5 lb

## 2013-02-13 DIAGNOSIS — E119 Type 2 diabetes mellitus without complications: Secondary | ICD-10-CM

## 2013-02-13 DIAGNOSIS — E785 Hyperlipidemia, unspecified: Secondary | ICD-10-CM

## 2013-02-13 DIAGNOSIS — I10 Essential (primary) hypertension: Secondary | ICD-10-CM

## 2013-02-13 DIAGNOSIS — J069 Acute upper respiratory infection, unspecified: Secondary | ICD-10-CM | POA: Insufficient documentation

## 2013-02-13 NOTE — Assessment & Plan Note (Signed)
Residual cough / post nasal drip and no fever  tx with mucinex prn  Update if not starting to improve in a week or if worsening

## 2013-02-13 NOTE — Assessment & Plan Note (Signed)
Body build and history are compatible with obstructive sleep apnea. We discussed his responsibility to drive safely in the therapeutic benefit of weight loss. Plan-unattended home sleep study

## 2013-02-13 NOTE — Assessment & Plan Note (Signed)
New dx/ first encounter for this Rev lab Ref to DM ed to see if we can control with lifestyle change and wt loss Info given on diet and meal planning Exercise discussed as well  F/u 2 mo with lab prior

## 2013-02-13 NOTE — Progress Notes (Signed)
Subjective:    Patient ID: Sean Lee, male    DOB: 05-11-1961, 52 y.o.   MRN: [id number]  HPI Here for f/u of chronic health problems and new DM2  Also had a cold- that is better but still coughing   Lab Results  Component Value Date   HGBA1C 7.1* 01/02/2013    Wt is down 2 lb with bmi of 27 Glucose was 112 fasting prior   Is being tested for sleep apnea- overnt pulse ox   Diet - he made some changes  He quit eating biscuits entirely  Am: apple and juice occ grapes  Needs help with meal planning Much more salads and green veggies Does not as a rule eat sweets  Is interested in DM teaching at [facility]  No excessive thirst or urination/ no numbness   Exercise- making an effort   bp is stable today  No cp or palpitations or headaches or edema  No side effects to medicines  BP Readings from Last 3 Encounters:  02/13/13 122/86  01/30/13 150/88  01/02/13 136/82     Lab Results  Component Value Date   CHOL 141 03/21/2012   HDL 43.50 03/21/2012   LDLCALC 79 03/21/2012   TRIG 91.0 03/21/2012   CHOLHDL 3 03/21/2012    Patient Active Problem List   Diagnosis Date Noted  . Viral URI with cough 02/13/2013  . Diabetes type 2, controlled 01/02/2013  . Varicose veins 03/21/2012  . Rectus diastasis of lower abdomen 03/21/2012  . Left arm pain 06/08/2011  . OBSTRUCTIVE SLEEP APNEA 10/15/2009  . OBESITY 09/02/2009  . HYPERLIPIDEMIA 02/02/2007  . HEMATURIA, MICROSCOPIC 02/02/2007  . HYPERTENSION 02/01/2007  . SACROILIITIS 02/01/2007  . CEREBROVASCULAR ACCIDENT, HX OF 02/01/2007   Past Medical History  Diagnosis Date  . Obesity, unspecified   . Contact dermatitis and other eczema due to plants (except food)   . Other and unspecified hyperlipidemia   . Sacroiliitis, not elsewhere classified   . H/O: CVA (cardiovascular accident)     Carotid doppler neg (4/08)  . Unspecified essential hypertension   . Hematuria    Past Surgical History  Procedure Laterality Date  .  Hernia repair      umbilical  . Carotid dopplers  4/08    Negative   History  Substance Use Topics  . Smoking status: Never Smoker   . Smokeless tobacco: Never Used  . Alcohol Use: Yes     Comment: occassionally   Family History  Problem Relation Age of Onset  . Hypertension Mother   . Diabetes Mother   . Hyperlipidemia      in family   . Cancer Neg Hx    Allergies  Allergen Reactions  . Benazepril Hcl     REACTION: cough   Current Outpatient Prescriptions on File Prior to Visit  Medication Sig Dispense Refill  . aspirin 325 MG tablet Take 325 mg by mouth daily.        Marland Kitchen BENICAR HCT 40-25 MG per tablet TAKE 1 TABLET BY MOUTH EVERY DAY  90 tablet  1  . hydrochlorothiazide 25 MG tablet TAKE ONE TABLET BY MOUTH DAILY  30 tablet  0  . metoprolol (LOPRESSOR) 50 MG tablet Take 0.5 tablets (25 mg total) by mouth 2 (two) times daily.  90 tablet  1   No current facility-administered medications on file prior to visit.     Review of Systems Review of Systems  Constitutional: Negative for fever, appetite change, and  unexpected weight change. pos for fatigue Eyes: Negative for pain and visual disturbance.  Respiratory: Negative for cough and shortness of breath.   Cardiovascular: Negative for cp or palpitations    Gastrointestinal: Negative for nausea, diarrhea and constipation.  Genitourinary: Negative for urgency and frequency.  Skin: Negative for pallor or rash   Neurological: Negative for weakness, light-headedness, numbness and headaches.  Hematological: Negative for adenopathy. Does not bruise/bleed easily.  Psychiatric/Behavioral: Negative for dysphoric mood. The patient is not nervous/anxious.         Objective:   Physical Exam  Constitutional: He appears well-developed and well-nourished. No distress.  obese and well appearing   HENT:  Head: Normocephalic and atraumatic.  Right Ear: External ear normal.  Left Ear: External ear normal.  Mouth/Throat: Oropharynx  is clear and moist.  Nares are boggy and congested  No sinus pain   Eyes: Conjunctivae and EOM are normal. Pupils are equal, round, and reactive to light. Right eye exhibits no discharge. Left eye exhibits no discharge. No scleral icterus.  Neck: Normal range of motion. Neck supple. No JVD present. Carotid bruit is not present. No thyromegaly present.  Cardiovascular: Normal rate, regular rhythm, normal heart sounds and intact distal pulses.  Exam reveals no gallop.   Pulmonary/Chest: Effort normal and breath sounds normal. No respiratory distress. He has no wheezes. He has no rales.  Abdominal: He exhibits no abdominal bruit.  Musculoskeletal: He exhibits no edema and no tenderness.  Lymphadenopathy:    He has no cervical adenopathy.  Neurological: He is alert. He has normal reflexes.  Skin: Skin is warm and dry. No rash noted. No erythema. No pallor.  Psychiatric: He has a normal mood and affect.          Assessment & Plan:

## 2013-02-13 NOTE — Assessment & Plan Note (Signed)
Will check at next f/u Rev low sat fat diet

## 2013-02-13 NOTE — Patient Instructions (Addendum)
We will refer you to diabetic teaching at check out  Try to avoid sweets and get protein with each meal Work on exercise and weight loss For cough - try mucinex over the counter- to loosen congestion Update if not starting to improve in a week or if worsening   Schedule fasting lab in 2 months and then follow up

## 2013-02-13 NOTE — Assessment & Plan Note (Signed)
bp in fair control at this time  No changes needed  Disc lifstyle change with low sodium diet and exercise   

## 2013-02-15 ENCOUNTER — Encounter: Payer: Self-pay | Admitting: Internal Medicine

## 2013-02-15 DIAGNOSIS — G4733 Obstructive sleep apnea (adult) (pediatric): Secondary | ICD-10-CM

## 2013-02-20 ENCOUNTER — Telehealth: Payer: Self-pay | Admitting: Internal Medicine

## 2013-02-20 DIAGNOSIS — G4733 Obstructive sleep apnea (adult) (pediatric): Secondary | ICD-10-CM

## 2013-02-20 NOTE — Telephone Encounter (Signed)
Please advise on sleep study results.  Results are scanned in EMR.

## 2013-02-20 NOTE — Telephone Encounter (Signed)
Spoke with pt and given sleep study results per Dr Maple Hudson.  Order placed for CPAP.  Copy of Sleep study faxed to [facility] at [phone] per pt request.

## 2013-02-20 NOTE — Telephone Encounter (Signed)
Returning call can be reached at [phone].Raylene Everts

## 2013-02-20 NOTE — Telephone Encounter (Signed)
Sleep study- showed mild obstructive sleep apnea, stopping breathing 9.7x/ hour. Anything over 5/ hr is abnormal. 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 [facility] 5-20 x 7 days for pressure recommendation, mask of choice, humidifier, supplies, DX OSA

## 2013-02-20 NOTE — Telephone Encounter (Signed)
lmtcb x1 w/ family member  

## 2013-02-28 ENCOUNTER — Other Ambulatory Visit: Payer: Self-pay | Admitting: *Deleted

## 2013-02-28 MED ORDER — OLMESARTAN MEDOXOMIL-HCTZ 40-25 MG PO TABS: ORAL_TABLET | ORAL | Status: DC

## 2013-03-03 ENCOUNTER — Other Ambulatory Visit: Payer: Self-pay | Admitting: *Deleted

## 2013-03-03 NOTE — Telephone Encounter (Signed)
Opened in error

## 2013-03-20 ENCOUNTER — Encounter: Payer: Self-pay | Admitting: Internal Medicine

## 2013-03-20 ENCOUNTER — Ambulatory Visit (INDEPENDENT_AMBULATORY_CARE_PROVIDER_SITE_OTHER): Payer: BC Managed Care – PPO | Admitting: Internal Medicine

## 2013-03-20 ENCOUNTER — Telehealth: Payer: Self-pay | Admitting: Internal Medicine

## 2013-03-20 VITALS — BP 118/64 | HR 90 | Ht 73.0 in | Wt 289.2 lb

## 2013-03-20 DIAGNOSIS — G4733 Obstructive sleep apnea (adult) (pediatric): Secondary | ICD-10-CM

## 2013-03-20 MED ORDER — ZOLPIDEM TARTRATE 5 MG PO TABS: 5.0000 mg | ORAL_TABLET | Freq: Every evening | ORAL | Status: DC | PRN

## 2013-03-20 NOTE — Telephone Encounter (Signed)
Rx has been called in per CY. Pt is aware. 

## 2013-03-20 NOTE — Telephone Encounter (Signed)
Please send Rx ambien 5 mg, # 30, 1 for sleep if needed No ref

## 2013-03-20 NOTE — Progress Notes (Signed)
01/30/13- 52 yom never smoker referred courtesy of Dr Milinda Antis for Sleep Medicine consultation. Had NPSG several years ago-not using CPAP. Now needs renewal of DOT CDL. Their screen including measurement of neck size required sleep apnea be ruled out now. He is aware of snoring some but his wife does not tell him of apnea. 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 Bedtime 8:30 PM, sleep latency 40 minutes, waking once before up at 3 AM. Gained 20 pounds in the last 2 years. Denies heart or lung problems but treated for hypertension. No ENT surgery, no history of nasal fracture  03/20/13- 52 yom never smoker referred courtesy of Dr Milinda Antis for Obstructive Sleep Apnea FOLLOWS FOR: Wears CPAP auto/ [facility] every night; mask "pops" off in hth middle of night and unsure how its happening. [facility] has not reached patient about getting a compliance report. Nasal mask, humidifier. Unattended home sleep study 02/11/2013-mild obstructive apnea, AHI 9.7 per hour with snoring, desaturation to 79%, weight 286 pounds.  ROS-see HPI Constitutional:   No-   weight loss, night sweats, fevers, chills, fatigue, lassitude. HEENT:   No-  headaches, difficulty swallowing, tooth/dental problems, sore throat,       No-  sneezing, itching, ear ache, nasal congestion, post nasal drip,  CV:  No-   chest pain, orthopnea, PND, swelling in lower extremities, anasarca,  dizziness, palpitations Resp: No-   shortness of breath with exertion or at rest.              No-   productive cough,  No non-productive cough,  No- coughing up of blood.              No-   change in color of mucus.  No- wheezing.   Skin: No-   rash or lesions. GI:  No-   heartburn, indigestion, abdominal pain, nausea, vomiting, GU: . MS:  No-   joint pain or swelling.   Neuro-     nothing unusual Psych:  No- change in mood or affect. No depression or anxiety.  No memory loss.  OBJ- Physical Exam General- Alert,  Oriented, Affect-appropriate, Distress- none acute, overweight, alert Skin- rash-none, lesions- none, excoriation- none Lymphadenopathy- none Head- atraumatic            Eyes- Gross vision intact, PERRLA, conjunctivae and secretions clear            Ears- Hearing, canals-normal            Nose- Clear, no-Septal dev, mucus, polyps, erosion, perforation             Throat- Mallampati III-IV , mucosa clear , drainage- none, tonsils- atrophic Neck- flexible , trachea midline, no stridor , thyroid nl, carotid no bruit Chest - symmetrical excursion , unlabored           Heart/CV- RRR , no murmur , no gallop  , no rub, nl s1 s2                           - JVD- none , edema- none, stasis changes- none, varices- none           Lung- clear to P&A, wheeze- none, cough- none , dullness-none, rub- none           Chest wall-  Abd-  Br/ Gen/ Rectal- Not done, not indicated Extrem- cyanosis- none, clubbing, none, atrophy- none, strength- nl Neuro- grossly intact to  observation

## 2013-03-20 NOTE — Patient Instructions (Addendum)
We can continue CPAP [facility]. When I get the download report, I will decide whether to recommend a change to fixed presure, or stay where we are for now.   Please call as needed

## 2013-03-20 NOTE — Telephone Encounter (Signed)
I do not see any mention of ambien in pt instructions. Please advise Dr. Maple Hudson thanks

## 2013-03-29 NOTE — Assessment & Plan Note (Signed)
Unattended home sleep study 02/11/2013-mild obstructive apnea, AHI 9.7 per hour with snoring, desaturation to 79%, weight 286 pounds. CPAP/auto/[facility] He is still getting used to CPAP but expect him to do pretty well eventually. For now we discussed help with sleep consolidation with Ambien. I discussed potential for sleepwalking.

## 2013-04-24 ENCOUNTER — Other Ambulatory Visit (INDEPENDENT_AMBULATORY_CARE_PROVIDER_SITE_OTHER): Payer: BC Managed Care – PPO

## 2013-04-24 ENCOUNTER — Ambulatory Visit: Payer: BC Managed Care – PPO | Admitting: Family Medicine

## 2013-04-24 DIAGNOSIS — E785 Hyperlipidemia, unspecified: Secondary | ICD-10-CM

## 2013-04-24 DIAGNOSIS — I10 Essential (primary) hypertension: Secondary | ICD-10-CM

## 2013-04-24 DIAGNOSIS — E119 Type 2 diabetes mellitus without complications: Secondary | ICD-10-CM

## 2013-04-24 LAB — COMPREHENSIVE METABOLIC PANEL
Albumin: 3.7 g/dL (ref 3.5–5.2)
CO2: 28 mEq/L (ref 19–32)
GFR: 85.76 mL/min (ref >60.00)
Glucose, Bld: 145 mg/dL — ABNORMAL HIGH (ref 70–99)
Potassium: 4 mEq/L (ref 3.5–5.1)
Sodium: 135 mEq/L (ref 135–145)
Total Protein: 7.5 g/dL (ref 6.0–8.3)

## 2013-04-24 LAB — HEMOGLOBIN A1C: Hgb A1c MFr Bld: 6.9 % — ABNORMAL HIGH (ref 4.6–6.5)

## 2013-04-27 ENCOUNTER — Other Ambulatory Visit: Payer: Self-pay | Admitting: Internal Medicine

## 2013-04-28 NOTE — Telephone Encounter (Signed)
Ok to refill till next appoint in December

## 2013-04-28 NOTE — Telephone Encounter (Signed)
Called refill to pharmacy voicemail.  

## 2013-04-28 NOTE — Telephone Encounter (Signed)
Please advise if okay to refill. Thanks.  

## 2013-04-29 ENCOUNTER — Other Ambulatory Visit: Payer: Self-pay | Admitting: Internal Medicine

## 2013-05-01 ENCOUNTER — Ambulatory Visit: Payer: BC Managed Care – PPO | Admitting: Family Medicine

## 2013-05-01 NOTE — Telephone Encounter (Signed)
Please advise if okay to refill. Thanks.  

## 2013-05-01 NOTE — Telephone Encounter (Signed)
Ok to refill 

## 2013-05-08 ENCOUNTER — Encounter: Payer: Self-pay | Admitting: Family Medicine

## 2013-05-08 ENCOUNTER — Ambulatory Visit (INDEPENDENT_AMBULATORY_CARE_PROVIDER_SITE_OTHER): Payer: BC Managed Care – PPO | Admitting: Family Medicine

## 2013-05-08 VITALS — BP 126/80 | HR 68 | Temp 98.5°F | Ht 73.0 in | Wt 289.2 lb

## 2013-05-08 DIAGNOSIS — E119 Type 2 diabetes mellitus without complications: Secondary | ICD-10-CM

## 2013-05-08 DIAGNOSIS — E669 Obesity, unspecified: Secondary | ICD-10-CM

## 2013-05-08 DIAGNOSIS — Z23 Encounter for immunization: Secondary | ICD-10-CM

## 2013-05-08 DIAGNOSIS — E785 Hyperlipidemia, unspecified: Secondary | ICD-10-CM

## 2013-05-08 NOTE — Assessment & Plan Note (Signed)
Disc goals for lipids and reasons to control them Rev labs with pt Rev low sat fat diet in detail   

## 2013-05-08 NOTE — Progress Notes (Signed)
Subjective:    Patient ID: Sean Lee, male    DOB: 21-Jul-1961, 52 y.o.   MRN: [id number]  HPI Here for f/u of chronic medical problems  Also he notices some pain/ numbness over his R hip area  No back problems   Wt is stable with bmi of 38 obese  bp is stable today  No cp or palpitations or headaches or edema  No side effects to medicines  BP Readings from Last 3 Encounters:  05/08/13 126/80  03/20/13 118/64  02/13/13 122/86     Diabetes- had to re schedule his DM teaching due to a recent move - and he built a house A lot of work  Plans to re schedule that  Home sugar results  DM diet -no more sodas- now drinks lots of water , eating a lot more vegetables , some fruit  No more sweets  Exercise - walking 30-60 min or riding bike - 4 days - working on going up to 5  Symptoms-none  A1C last  Lab Results  Component Value Date   HGBA1C 6.9* 04/24/2013  this is down from 7.1 last time - happy about the change   No problems with medications  Renal protection-is on ARB Last eye exam was   Is on cpap machine now - wakes up feeling   Flu vaccine -will do today Pneumovax--will do today  Lab Results  Component Value Date   CHOL 160 04/24/2013   CHOL 141 03/21/2012   CHOL 177 08/24/2008   Lab Results  Component Value Date   HDL 38.20* 04/24/2013   HDL 82.95 03/21/2012   HDL 37.6* 08/24/2008   Lab Results  Component Value Date   LDLCALC 98 04/24/2013   LDLCALC 79 03/21/2012   LDLCALC 125* 08/24/2008   Lab Results  Component Value Date   TRIG 117.0 04/24/2013   TRIG 91.0 03/21/2012   TRIG 72 08/24/2008   Lab Results  Component Value Date   CHOLHDL 4 04/24/2013   CHOLHDL 3 03/21/2012   CHOLHDL 4.7 CALC 08/24/2008   No results found for this basename: LDLDIRECT     more fish and chicken   Patient Active Problem List   Diagnosis Date Noted  . Viral URI with cough 02/13/2013  . Diabetes type 2, controlled 01/02/2013  . Varicose veins 03/21/2012  . Rectus diastasis of lower  abdomen 03/21/2012  . Left arm pain 06/08/2011  . OBSTRUCTIVE SLEEP APNEA 10/15/2009  . OBESITY 09/02/2009  . HYPERLIPIDEMIA 02/02/2007  . HEMATURIA, MICROSCOPIC 02/02/2007  . HYPERTENSION 02/01/2007  . SACROILIITIS 02/01/2007  . CEREBROVASCULAR ACCIDENT, HX OF 02/01/2007   Past Medical History  Diagnosis Date  . Obesity, unspecified   . Contact dermatitis and other eczema due to plants (except food)   . Other and unspecified hyperlipidemia   . Sacroiliitis, not elsewhere classified   . H/O: CVA (cardiovascular accident)     Carotid doppler neg (4/08)  . Unspecified essential hypertension   . Hematuria    Past Surgical History  Procedure Laterality Date  . Hernia repair      umbilical  . Carotid dopplers  4/08    Negative   History  Substance Use Topics  . Smoking status: Never Smoker   . Smokeless tobacco: Never Used  . Alcohol Use: Yes     Comment: occassionally   Family History  Problem Relation Age of Onset  . Hypertension Mother   . Diabetes Mother   . Hyperlipidemia  in family   . Cancer Neg Hx    Allergies  Allergen Reactions  . Benazepril Hcl     REACTION: cough   Current Outpatient Prescriptions on File Prior to Visit  Medication Sig Dispense Refill  . aspirin 325 MG tablet Take 325 mg by mouth daily.        . hydrochlorothiazide 25 MG tablet TAKE ONE TABLET BY MOUTH DAILY  30 tablet  0  . olmesartan-hydrochlorothiazide (BENICAR HCT) 40-25 MG per tablet TAKE 1 TABLET BY MOUTH EVERY DAY  90 tablet  1  . zolpidem (AMBIEN) 5 MG tablet TAKE 1 TABLET AT BEDTIME AS NEEDED  30 tablet  0   No current facility-administered medications on file prior to visit.    Review of Systems Review of Systems  Constitutional: Negative for fever, appetite change, fatigue and unexpected weight change.  Eyes: Negative for pain and visual disturbance.  Respiratory: Negative for cough and shortness of breath.   Cardiovascular: Negative for cp or palpitations     Gastrointestinal: Negative for nausea, diarrhea and constipation.  Genitourinary: Negative for urgency and frequency.  Skin: Negative for pallor or rash   MSK pos for L hip discomfort  Neurological: Negative for weakness, light-headedness, numbness and headaches.  Hematological: Negative for adenopathy. Does not bruise/bleed easily.  Psychiatric/Behavioral: Negative for dysphoric mood. The patient is not nervous/anxious.         Objective:   Physical Exam  Constitutional: He appears well-developed and well-nourished. No distress.  obese and well appearing   HENT:  Head: Normocephalic and atraumatic.  Mouth/Throat: Oropharynx is clear and moist.  Eyes: Conjunctivae and EOM are normal. Pupils are equal, round, and reactive to light. No scleral icterus.  Neck: Normal range of motion. Neck supple. No JVD present. Carotid bruit is not present. No thyromegaly present.  Cardiovascular: Normal rate, regular rhythm and normal heart sounds.  Exam reveals no gallop.   Pulmonary/Chest: Effort normal and breath sounds normal. No respiratory distress. He has no wheezes.  Abdominal: Soft. Bowel sounds are normal. He exhibits no distension, no abdominal bruit and no mass. There is no tenderness.  Musculoskeletal: He exhibits no edema.  Lymphadenopathy:    He has no cervical adenopathy.  Neurological: He is alert. He has normal reflexes. No cranial nerve deficit. Coordination normal.  Skin: Skin is warm and dry. No rash noted. No erythema. No pallor.  Psychiatric: He has a normal mood and affect.          Assessment & Plan:

## 2013-05-08 NOTE — Assessment & Plan Note (Signed)
Lab Results  Component Value Date   HGBA1C 6.9* 04/24/2013   Improved so far with some diet change and exercise Pt will re schedule f/u with DM teaching  Work on wt loss Flu and pneumonia vaccines today F/u 3 mo with lab prior

## 2013-05-08 NOTE — Patient Instructions (Addendum)
Keep up the good work with diet and exercise - and weight loss Increase exercise when able = to 5 days per week  Flu and pneumonia vaccines today  Re schedule your diabetic teaching classes  We will refer you to an eye doctor at check out  Follow up in 3 months with labs prior

## 2013-05-08 NOTE — Assessment & Plan Note (Signed)
Discussed how this problem influences overall health and the risks it imposes  Reviewed plan for weight loss with lower calorie diet (via better food choices and also portion control or program like weight watchers) and exercise building up to or more than 30 minutes 5 days per week including some aerobic activity    

## 2013-05-19 ENCOUNTER — Telehealth: Payer: Self-pay | Admitting: Internal Medicine

## 2013-05-19 NOTE — Telephone Encounter (Signed)
Per CY--ok to send the report.  This has been faxed over per pts request.  Note put on the face sheet to call the pt once he can pick up his card.  Nothing further is needed.

## 2013-05-19 NOTE — Telephone Encounter (Signed)
Called and spoke with pt and he stated that he needs a copy of his last download faxed to [facility],  Fax # [phone] and phone #[phone].   Pt stated that he needs this done today so he can go and pick up his card.  CY please advise if this is ok to send over a copy of the pts last download.  Thanks  Allergies  Allergen Reactions  . Benazepril Hcl     REACTION: cough     Last ov--03/20/2013 Next ov--07/24/2013

## 2013-06-23 ENCOUNTER — Telehealth: Payer: Self-pay | Admitting: Family Medicine

## 2013-06-23 NOTE — Telephone Encounter (Signed)
Received fax refill request from pharmacy for meter,lancets and stips, form needs to be signed and faxed the 1st time form in your inbox

## 2013-06-26 NOTE — Telephone Encounter (Signed)
Form faxed

## 2013-06-26 NOTE — Telephone Encounter (Signed)
Done and in IN box 

## 2013-06-29 ENCOUNTER — Encounter: Payer: Self-pay | Admitting: Family Medicine

## 2013-07-04 ENCOUNTER — Other Ambulatory Visit: Payer: Self-pay | Admitting: Family Medicine

## 2013-07-24 ENCOUNTER — Telehealth: Payer: Self-pay | Admitting: Internal Medicine

## 2013-07-24 ENCOUNTER — Ambulatory Visit: Payer: BC Managed Care – PPO | Admitting: Internal Medicine

## 2013-07-24 DIAGNOSIS — G4733 Obstructive sleep apnea (adult) (pediatric): Secondary | ICD-10-CM

## 2013-07-24 NOTE — Telephone Encounter (Signed)
lmomtcb for pt 

## 2013-07-25 NOTE — Telephone Encounter (Signed)
Pt is needing order sent to [facility] for tubing and nose piece. Order placed. Carron Curie, CMA

## 2013-07-31 ENCOUNTER — Other Ambulatory Visit: Payer: BC Managed Care – PPO

## 2013-08-07 ENCOUNTER — Ambulatory Visit: Payer: BC Managed Care – PPO | Admitting: Family Medicine

## 2013-08-24 ENCOUNTER — Other Ambulatory Visit: Payer: BC Managed Care – PPO

## 2013-08-28 ENCOUNTER — Ambulatory Visit: Payer: BC Managed Care – PPO | Admitting: Internal Medicine

## 2013-08-30 ENCOUNTER — Ambulatory Visit: Payer: BC Managed Care – PPO | Admitting: Family Medicine

## 2013-08-31 ENCOUNTER — Other Ambulatory Visit (INDEPENDENT_AMBULATORY_CARE_PROVIDER_SITE_OTHER): Payer: BC Managed Care – PPO

## 2013-08-31 DIAGNOSIS — I10 Essential (primary) hypertension: Secondary | ICD-10-CM

## 2013-08-31 DIAGNOSIS — E119 Type 2 diabetes mellitus without complications: Secondary | ICD-10-CM

## 2013-08-31 LAB — HEMOGLOBIN A1C: Hgb A1c MFr Bld: 6.1 % (ref 4.6–6.5)

## 2013-08-31 LAB — COMPREHENSIVE METABOLIC PANEL
ALK PHOS: 50 U/L (ref 39–117)
ALT: 29 U/L (ref 0–53)
AST: 30 U/L (ref 0–37)
Albumin: 3.8 g/dL (ref 3.5–5.2)
BILIRUBIN TOTAL: 1.3 mg/dL — AB (ref 0.3–1.2)
BUN: 20 mg/dL (ref 6–23)
CO2: 24 mEq/L (ref 19–32)
CREATININE: 1.6 mg/dL — AB (ref 0.4–1.5)
Calcium: 9.7 mg/dL (ref 8.4–10.5)
Chloride: 101 mEq/L (ref 96–112)
GFR: 60.68 mL/min (ref >60.00)
GLUCOSE: 94 mg/dL (ref 70–99)
Potassium: 3.8 mEq/L (ref 3.5–5.1)
SODIUM: 134 meq/L — AB (ref 135–145)
Total Protein: 7.7 g/dL (ref 6.0–8.3)

## 2013-09-08 ENCOUNTER — Encounter: Payer: Self-pay | Admitting: Family Medicine

## 2013-09-08 ENCOUNTER — Ambulatory Visit (INDEPENDENT_AMBULATORY_CARE_PROVIDER_SITE_OTHER): Payer: BC Managed Care – PPO | Admitting: Family Medicine

## 2013-09-08 VITALS — BP 120/80 | HR 91 | Temp 97.5°F | Ht 73.0 in | Wt 279.5 lb

## 2013-09-08 DIAGNOSIS — I10 Essential (primary) hypertension: Secondary | ICD-10-CM

## 2013-09-08 DIAGNOSIS — E119 Type 2 diabetes mellitus without complications: Secondary | ICD-10-CM

## 2013-09-08 DIAGNOSIS — M76899 Other specified enthesopathies of unspecified lower limb, excluding foot: Secondary | ICD-10-CM

## 2013-09-08 DIAGNOSIS — M7060 Trochanteric bursitis, unspecified hip: Secondary | ICD-10-CM

## 2013-09-08 NOTE — Patient Instructions (Signed)
Follow up with me in about 6 months with labs prior  A1C is better - good job/ keep it up  Work on weight loss - 1 lb per weeks  Keep exercising but mix up the type of exercise  Use ice on the hip as needed -try the stretches I gave you  If worse - then call and make an appt with Dr Lorelei Pont (he is here- sport med)

## 2013-09-08 NOTE — Progress Notes (Signed)
Subjective:    Patient ID: Sean Lee, male    DOB: Dec 04, 1960, 53 y.o.   MRN: [id number]  HPI Here for f/u of chronic medical problems   Had a move and that is stressful and difficult Schedule has changed  Is thinking about an accountability program  Works every day   He has a tingly area on R thigh -no rash  No back pain  Hurts to lie on that side at night   Wt is down another 10 lb  Really working on it  bmi of 36  bp is stable today  No cp or palpitations or headaches or edema  No side effects to medicines  BP Readings from Last 3 Encounters:  09/08/13 120/80  05/08/13 126/80  03/20/13 118/64       Chemistry      Component Value Date/Time   NA 134* 08/31/2013 0849   K 3.8 08/31/2013 0849   CL 101 08/31/2013 0849   CO2 24 08/31/2013 0849   BUN 20 08/31/2013 0849   CREATININE 1.6* 08/31/2013 0849      Component Value Date/Time   CALCIUM 9.7 08/31/2013 0849   ALKPHOS 50 08/31/2013 0849   AST 30 08/31/2013 0849   ALT 29 08/31/2013 0849   BILITOT 1.3* 08/31/2013 0849     he may not drink enough water  Cr is elevated to 1.6 / GFR is still nl at 60.68 This is up form him  Diabetes- diet controlled  Home sugar results -no highs or lows  DM diet - doing well with that  Exercise - walks every day 30-60 minutes - on a track  Symptoms-none  A1C last  Lab Results  Component Value Date   HGBA1C 6.1 08/31/2013  this is down from 6.9   Renal protection benicar   Last eye exam 11/14 - he is a glaucoma suspect -using eye drops    Patient Active Problem List   Diagnosis Date Noted  . Trochanteric bursitis 09/08/2013  . Diabetes type 2, controlled 01/02/2013  . Varicose veins 03/21/2012  . Rectus diastasis of lower abdomen 03/21/2012  . Left arm pain 06/08/2011  . OBSTRUCTIVE SLEEP APNEA 10/15/2009  . OBESITY 09/02/2009  . HYPERLIPIDEMIA 02/02/2007  . HEMATURIA, MICROSCOPIC 02/02/2007  . HYPERTENSION 02/01/2007  . SACROILIITIS 02/01/2007  . CEREBROVASCULAR  ACCIDENT, HX OF 02/01/2007   Past Medical History  Diagnosis Date  . Obesity, unspecified   . Contact dermatitis and other eczema due to plants (except food)   . Other and unspecified hyperlipidemia   . Sacroiliitis, not elsewhere classified   . H/O: CVA (cardiovascular accident)     Carotid doppler neg (4/08)  . Unspecified essential hypertension   . Hematuria    Past Surgical History  Procedure Laterality Date  . Hernia repair      umbilical  . Carotid dopplers  4/08    Negative   History  Substance Use Topics  . Smoking status: Never Smoker   . Smokeless tobacco: Never Used  . Alcohol Use: Yes     Comment: occassionally   Family History  Problem Relation Age of Onset  . Hypertension Mother   . Diabetes Mother   . Hyperlipidemia      in family   . Cancer Neg Hx    Allergies  Allergen Reactions  . Benazepril Hcl     REACTION: cough   Current Outpatient Prescriptions on File Prior to Visit  Medication Sig Dispense Refill  . aspirin 325  MG tablet Take 325 mg by mouth daily.        Marland Kitchen BENICAR HCT 40-25 MG per tablet TAKE 1 TABLET DAILY  90 tablet  0  . zolpidem (AMBIEN) 5 MG tablet TAKE 1 TABLET AT BEDTIME AS NEEDED  30 tablet  0   No current facility-administered medications on file prior to visit.     Review of Systems Review of Systems  Constitutional: Negative for fever, appetite change, fatigue and unexpected weight change.  Eyes: Negative for pain and visual disturbance.  Respiratory: Negative for cough and shortness of breath.   Cardiovascular: Negative for cp or palpitations    Gastrointestinal: Negative for nausea, diarrhea and constipation.  Genitourinary: Negative for urgency and frequency.  Skin: Negative for pallor or rash   MSK neg for joint swelling  Neurological: Negative for weakness, light-headedness, numbness and headaches.  Hematological: Negative for adenopathy. Does not bruise/bleed easily.  Psychiatric/Behavioral: Negative for dysphoric  mood. The patient is not nervous/anxious.         Objective:   Physical Exam  Constitutional: He appears well-developed and well-nourished. No distress.  obese and well appearing   HENT:  Head: Normocephalic and atraumatic.  Mouth/Throat: Oropharynx is clear and moist.  Eyes: Conjunctivae and EOM are normal. Pupils are equal, round, and reactive to light. Right eye exhibits no discharge. Left eye exhibits no discharge. No scleral icterus.  Neck: Normal range of motion. Neck supple. No JVD present. Carotid bruit is not present. No thyromegaly present.  Cardiovascular: Normal rate, regular rhythm and normal heart sounds.  Exam reveals no gallop.   Pulmonary/Chest: Effort normal and breath sounds normal. No respiratory distress. He has no wheezes.  Abdominal: Soft. Bowel sounds are normal. He exhibits no distension, no abdominal bruit and no mass. There is no tenderness.  Musculoskeletal: He exhibits tenderness. He exhibits no edema.  Mildly tender over R greater trochanter  Reviewed stretches   Lymphadenopathy:    He has no cervical adenopathy.  Neurological: He is alert. He has normal reflexes. No cranial nerve deficit. He exhibits normal muscle tone. Coordination normal.  Skin: Skin is warm and dry. No rash noted. No erythema. No pallor.  Psychiatric: He has a normal mood and affect.          Assessment & Plan:

## 2013-09-08 NOTE — Progress Notes (Signed)
Pre-visit discussion using our clinic review tool. No additional management support is needed unless otherwise documented below in the visit note.  

## 2013-09-10 NOTE — Assessment & Plan Note (Signed)
Disc use of cold / ice/ stretches Can f/u with Dr Lorelei Pont if not imp  Made plan to cross train - avoid just one type of exercise which may stress the area

## 2013-09-10 NOTE — Assessment & Plan Note (Signed)
BP: 120/80 mmHg  bp in fair control at this time  No changes needed Disc lifstyle change with low sodium diet and exercise   Commended on wt loss  Will need to watch renal fxn carefully

## 2013-09-10 NOTE — Assessment & Plan Note (Signed)
Lab Results  Component Value Date   HGBA1C 6.1 08/31/2013   Making good pregress with lifestyle change Rev diet/ low glycemic and exercise

## 2013-09-12 ENCOUNTER — Telehealth: Payer: Self-pay

## 2013-09-12 NOTE — Telephone Encounter (Signed)
Relevant patient education assigned to patient using Emmi. ° °

## 2013-09-20 ENCOUNTER — Telehealth: Payer: Self-pay | Admitting: Family Medicine

## 2013-09-20 NOTE — Telephone Encounter (Signed)
Relevant patient education mailed to patient.  

## 2013-09-28 ENCOUNTER — Ambulatory Visit: Payer: BC Managed Care – PPO | Admitting: Internal Medicine

## 2013-10-07 ENCOUNTER — Other Ambulatory Visit: Payer: Self-pay | Admitting: Family Medicine

## 2013-11-02 ENCOUNTER — Ambulatory Visit: Payer: BC Managed Care – PPO | Admitting: Internal Medicine

## 2013-11-02 ENCOUNTER — Encounter: Payer: Self-pay | Admitting: Internal Medicine

## 2013-11-06 ENCOUNTER — Ambulatory Visit (INDEPENDENT_AMBULATORY_CARE_PROVIDER_SITE_OTHER): Payer: BC Managed Care – PPO | Admitting: Internal Medicine

## 2013-11-06 ENCOUNTER — Encounter: Payer: Self-pay | Admitting: Internal Medicine

## 2013-11-06 VITALS — BP 140/84 | HR 82 | Temp 98.3°F | Wt 269.5 lb

## 2013-11-06 DIAGNOSIS — J309 Allergic rhinitis, unspecified: Secondary | ICD-10-CM

## 2013-11-06 NOTE — Progress Notes (Signed)
Pre visit review using our clinic review tool, if applicable. No additional management support is needed unless otherwise documented below in the visit note. 

## 2013-11-06 NOTE — Progress Notes (Signed)
HPI  Pt presents to the clinic today with c/o cough and chest congestion. He reports this started 1 week ago. He also c/o head congestion, sneezing, watery eyes and night sweats. The cough is productive of thick green sputums. He reports he coughed up blood this am. He denies fever, chills or body aches. He has not tried anything OTC. He has not had sick contacts.  Review of Systems      Past Medical History  Diagnosis Date  . Obesity, unspecified   . Contact dermatitis and other eczema due to plants (except food)   . Other and unspecified hyperlipidemia   . Sacroiliitis, not elsewhere classified   . H/O: CVA (cardiovascular accident)     Carotid doppler neg (4/08)  . Unspecified essential hypertension   . Hematuria     Family History  Problem Relation Age of Onset  . Hypertension Mother   . Diabetes Mother   . Hyperlipidemia      in family   . Cancer Neg Hx     History   Social History  . Marital Status: Married    Spouse Name: N/A    Number of Children: 2  . Years of Education: N/A   Occupational History  . 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 Smoking status: Never Smoker   . Smokeless tobacco: Never Used  . Alcohol Use: Yes     Comment: occassionally  . Drug Use: No  . Sexual Activity: Not on file   Other Topics Concern  . Not on file   Social History Narrative  . No narrative on file    Allergies  Allergen Reactions  . Benazepril Hcl     REACTION: cough     Constitutional: Positive headache. Denies fatigue, fever or abrupt weight changes.  HEENT:  Positive sore throat. Denies eye redness, eye pain, pressure behind the eyes, facial pain, nasal congestion, ear pain, ringing in the ears, wax buildup, runny nose or bloody nose. Respiratory: Positive cough. Denies difficulty breathing or shortness of breath.  Cardiovascular: Denies chest pain, chest tightness, palpitations or swelling in the hands or feet.   No other specific  complaints in a complete review of systems (except as listed in HPI above).  Objective:   BP 140/84  Pulse 82  Temp(Src) 98.3 F (36.8 C) (Oral)  Wt 269 lb 8 oz (122.244 kg)  SpO2 96% Wt Readings from Last 3 Encounters:  11/06/13 269 lb 8 oz (122.244 kg)  09/08/13 279 lb 8 oz (126.78 kg)  05/08/13 289 lb 4 oz (131.203 kg)     General: Appears his stated age, well developed, well nourished in NAD. HEENT: Head: normal shape and size; Eyes: sclera white, no icterus, conjunctiva pink, PERRLA and EOMs intact; Ears: Tm's gray and intact, normal light reflex; Nose: mucosa boggy and moist, septum midline; Throat/Mouth: + PND. Teeth present, mucosa erythematous and moist, no exudate noted, no lesions or ulcerations noted.  Neck:  Neck supple, trachea midline. No massses, lumps or thyromegaly present.  Cardiovascular: Normal rate and rhythm. S1,S2 noted.  No murmur, rubs or gallops noted. No JVD or BLE edema. No carotid bruits noted. Pulmonary/Chest: Normal effort and positive vesicular breath sounds. No respiratory distress. No wheezes, rales or ronchi noted.      Assessment & Plan:   Allergic Rhinitis:  Get some rest and drink plenty of water Do salt water gargles for the sore throat Take zyrtec OTC daily x 2 weeks  Ibuprofen  TID prn for body aches Work note provided  RTC as needed or if symptoms persist.

## 2013-11-06 NOTE — Patient Instructions (Addendum)
Allergic Rhinitis Allergic rhinitis is when the mucous membranes in the nose respond to allergens. Allergens are particles in the air that cause your body to have an allergic reaction. This causes you to release allergic antibodies. Through a chain of events, these eventually cause you to release histamine into the blood stream. Although meant to protect the body, it is this release of histamine that causes your discomfort, such as frequent sneezing, congestion, and an itchy, runny nose.  CAUSES  Seasonal allergic rhinitis (hay fever) is caused by pollen allergens that may come from grasses, trees, and weeds. Year-round allergic rhinitis (perennial allergic rhinitis) is caused by allergens such as house dust mites, pet dander, and mold spores.  SYMPTOMS   Nasal stuffiness (congestion).  Itchy, runny nose with sneezing and tearing of the eyes. DIAGNOSIS  Your health care provider can help you determine the allergen or allergens that trigger your symptoms. If you and your health care provider are unable to determine the allergen, skin or blood testing may be used. TREATMENT  Allergic Rhinitis does not have a cure, but it can be controlled by:  Medicines and allergy shots (immunotherapy).  Avoiding the allergen. Hay fever may often be treated with antihistamines in pill or nasal spray forms. Antihistamines block the effects of histamine. There are over-the-counter medicines that may help with nasal congestion and swelling around the eyes. Check with your health care provider before taking or giving this medicine.  If avoiding the allergen or the medicine prescribed do not work, there are many new medicines your health care provider can prescribe. Stronger medicine may be used if initial measures are ineffective. Desensitizing injections can be used if medicine and avoidance does not work. Desensitization is when a patient is given ongoing shots until the body becomes less sensitive to the allergen.  Make sure you follow up with your health care provider if problems continue. HOME CARE INSTRUCTIONS It is not possible to completely avoid allergens, but you can reduce your symptoms by taking steps to limit your exposure to them. It helps to know exactly what you are allergic to so that you can avoid your specific triggers. SEEK MEDICAL CARE IF:   You have a fever.  You develop a cough that does not stop easily (persistent).  You have shortness of breath.  You start wheezing.  Symptoms interfere with normal daily activities. Document Released: 04/28/2001 Document Revised: 05/24/2013 Document Reviewed: 04/10/2013 [employer] Patient Information 2014 [employer].  

## 2013-11-30 ENCOUNTER — Other Ambulatory Visit: Payer: Self-pay | Admitting: Internal Medicine

## 2013-12-01 NOTE — Telephone Encounter (Signed)
CY, Please advise if okay to refill. Thanks.  

## 2013-12-01 NOTE — Telephone Encounter (Signed)
Ok to refill 

## 2013-12-04 NOTE — Telephone Encounter (Signed)
Called refill to pharmacy voicemail.  

## 2013-12-07 ENCOUNTER — Other Ambulatory Visit: Payer: Self-pay

## 2013-12-07 MED ORDER — OLMESARTAN MEDOXOMIL-HCTZ 40-25 MG PO TABS: ORAL_TABLET | ORAL | Status: DC

## 2013-12-07 NOTE — Telephone Encounter (Signed)
[facility] pharmacist said # 5 will cost pt $43.00; unable to reach pt by phone and when initially spoke with pt advised that small quantity can be expensive and may cost more than his copay but pt still requested # 5. Advised pharmacist when pt comes in to pick up med if pt wants to increase quantity to call office back. Pharmacist voiced understanding.

## 2013-12-07 NOTE — Telephone Encounter (Signed)
Pt forgot med and is in [place]; pt request Benicar HCT # 5 sent to [facility]> advised pt done.

## 2013-12-21 ENCOUNTER — Ambulatory Visit: Payer: BC Managed Care – PPO | Admitting: Internal Medicine

## 2014-01-25 ENCOUNTER — Ambulatory Visit: Payer: BC Managed Care – PPO | Admitting: Internal Medicine

## 2014-02-08 ENCOUNTER — Other Ambulatory Visit: Payer: Self-pay | Admitting: Internal Medicine

## 2014-02-08 NOTE — Telephone Encounter (Signed)
Electronic refill request received Ambien 5mg  qhs prn Last refill sent at the 8.4.14 ov w/ CDY for #30 Pt was recommended to follow up December 2014 >> upcoming appt scheduled for 7.9.15 w/ CDY  Dr Annamaria Boots please advise if okay to fill the Ambien 5mg , thank you!

## 2014-02-08 NOTE — Telephone Encounter (Signed)
Ok to refill 

## 2014-02-09 ENCOUNTER — Other Ambulatory Visit: Payer: Self-pay | Admitting: *Deleted

## 2014-02-09 MED ORDER — ZOLPIDEM TARTRATE 5 MG PO TABS: ORAL_TABLET | ORAL | Status: DC

## 2014-02-22 ENCOUNTER — Ambulatory Visit: Payer: BC Managed Care – PPO | Admitting: Internal Medicine

## 2014-03-11 ENCOUNTER — Telehealth: Payer: Self-pay | Admitting: Family Medicine

## 2014-03-11 DIAGNOSIS — I10 Essential (primary) hypertension: Secondary | ICD-10-CM

## 2014-03-11 DIAGNOSIS — E119 Type 2 diabetes mellitus without complications: Secondary | ICD-10-CM

## 2014-03-11 DIAGNOSIS — E785 Hyperlipidemia, unspecified: Secondary | ICD-10-CM

## 2014-03-11 NOTE — Telephone Encounter (Signed)
Message copied by Abner Greenspan on Sun Mar 11, 2014 11:22 AM ------      Message from: Ellamae Sia      Created: Tue Mar 06, 2014  2:38 PM      Regarding: Lab orders for Monday, 7.27.15       Lab orders for a 6 month f/u, to include tests for the diabetic bundle, Lipid, A1c, Thanks, t ------

## 2014-03-12 ENCOUNTER — Other Ambulatory Visit: Payer: BC Managed Care – PPO

## 2014-03-14 ENCOUNTER — Ambulatory Visit: Payer: BC Managed Care – PPO | Admitting: Family Medicine

## 2014-03-22 ENCOUNTER — Other Ambulatory Visit (INDEPENDENT_AMBULATORY_CARE_PROVIDER_SITE_OTHER): Payer: BC Managed Care – PPO

## 2014-03-22 DIAGNOSIS — E785 Hyperlipidemia, unspecified: Secondary | ICD-10-CM

## 2014-03-22 DIAGNOSIS — E119 Type 2 diabetes mellitus without complications: Secondary | ICD-10-CM

## 2014-03-22 DIAGNOSIS — I10 Essential (primary) hypertension: Secondary | ICD-10-CM

## 2014-03-22 LAB — COMPREHENSIVE METABOLIC PANEL
ALBUMIN: 3.8 g/dL (ref 3.5–5.2)
ALT: 27 U/L (ref 0–53)
AST: 25 U/L (ref 0–37)
Alkaline Phosphatase: 52 U/L (ref 39–117)
BUN: 18 mg/dL (ref 6–23)
CALCIUM: 9.5 mg/dL (ref 8.4–10.5)
CHLORIDE: 99 meq/L (ref 96–112)
CO2: 25 meq/L (ref 19–32)
Creatinine, Ser: 1.2 mg/dL (ref 0.4–1.5)
GFR: 79.82 mL/min (ref >60.00)
Glucose, Bld: 122 mg/dL — ABNORMAL HIGH (ref 70–99)
POTASSIUM: 4 meq/L (ref 3.5–5.1)
Sodium: 133 mEq/L — ABNORMAL LOW (ref 135–145)
Total Bilirubin: 1.3 mg/dL — ABNORMAL HIGH (ref 0.2–1.2)
Total Protein: 7.7 g/dL (ref 6.0–8.3)

## 2014-03-22 LAB — HEMOGLOBIN A1C: Hgb A1c MFr Bld: 6.2 % (ref 4.6–6.5)

## 2014-03-22 LAB — LIPID PANEL
CHOL/HDL RATIO: 3
Cholesterol: 160 mg/dL (ref 0–200)
HDL: 45.8 mg/dL (ref >39.00)
LDL CALC: 97 mg/dL (ref 0–99)
NONHDL: 114.2
TRIGLYCERIDES: 87 mg/dL (ref 0.0–149.0)
VLDL: 17.4 mg/dL (ref 0.0–40.0)

## 2014-03-28 ENCOUNTER — Telehealth: Payer: Self-pay | Admitting: Family Medicine

## 2014-03-28 ENCOUNTER — Encounter: Payer: Self-pay | Admitting: Family Medicine

## 2014-03-28 ENCOUNTER — Ambulatory Visit (INDEPENDENT_AMBULATORY_CARE_PROVIDER_SITE_OTHER): Payer: BC Managed Care – PPO | Admitting: Family Medicine

## 2014-03-28 ENCOUNTER — Ambulatory Visit (INDEPENDENT_AMBULATORY_CARE_PROVIDER_SITE_OTHER)
Admission: RE | Admit: 2014-03-28 | Discharge: 2014-03-28 | Disposition: A | Discharge status: Home / Self Care | Payer: BC Managed Care – PPO | Source: Ambulatory Visit | Attending: Family Medicine | Admitting: Family Medicine

## 2014-03-28 VITALS — BP 118/82 | HR 79 | Temp 98.5°F | Ht 73.0 in | Wt 268.5 lb

## 2014-03-28 DIAGNOSIS — M25569 Pain in unspecified knee: Secondary | ICD-10-CM

## 2014-03-28 DIAGNOSIS — M25561 Pain in right knee: Secondary | ICD-10-CM | POA: Insufficient documentation

## 2014-03-28 DIAGNOSIS — E785 Hyperlipidemia, unspecified: Secondary | ICD-10-CM

## 2014-03-28 DIAGNOSIS — I10 Essential (primary) hypertension: Secondary | ICD-10-CM

## 2014-03-28 DIAGNOSIS — E669 Obesity, unspecified: Secondary | ICD-10-CM

## 2014-03-28 DIAGNOSIS — E119 Type 2 diabetes mellitus without complications: Secondary | ICD-10-CM

## 2014-03-28 NOTE — Progress Notes (Signed)
Pre visit review using our clinic review tool, if applicable. No additional management support is needed unless otherwise documented below in the visit note. 

## 2014-03-28 NOTE — Patient Instructions (Signed)
Your labs are stable  Sugar/cholesterol are stable and at goal  Work on weight loss Xray knee today  Then will make a plan  Follow for annual exam in 6 months with labs prior

## 2014-03-28 NOTE — Telephone Encounter (Signed)
Relevant patient education assigned to patient using Emmi. ° °

## 2014-03-28 NOTE — Progress Notes (Signed)
Subjective:    Patient ID: Sean Lee, male    DOB: Jan 24, 1961, 53 y.o.   MRN: [id number]  HPI Her for f/u of chronic health problems   Also problems with R leg and knee  He recently had orthotics made for pes planus  Now his R knee- medial pain - worse to walk  Stopped using the orthotic - and that did not make any difference  Knee is a little swollen-not red or hot  No injury or trauma   Wt is down 1 lb with bmi of 35    His hyperglycemia is diet controlled  A1C is 6.2- good control  Not walking enough - due to leg problem Is eating healthy -in general   Plans to [employer] and get a trainer   bp is stable today  No cp or palpitations or headaches or edema  No side effects to medicines  BP Readings from Last 3 Encounters:  03/28/14 118/82  11/06/13 140/84  09/08/13 120/80      Lipid at goal  Lab Results  Component Value Date   CHOL 160 03/22/2014   HDL 45.80 03/22/2014   LDLCALC 97 03/22/2014   TRIG 87.0 03/22/2014   CHOLHDL 3 03/22/2014     Patient Active Problem List   Diagnosis Date Noted  . Knee pain, right 03/28/2014  . Trochanteric bursitis 09/08/2013  . Diabetes type 2, controlled 01/02/2013  . Varicose veins 03/21/2012  . Rectus diastasis of lower abdomen 03/21/2012  . OBSTRUCTIVE SLEEP APNEA 10/15/2009  . OBESITY 09/02/2009  . HYPERLIPIDEMIA 02/02/2007  . HEMATURIA, MICROSCOPIC 02/02/2007  . HYPERTENSION 02/01/2007  . CEREBROVASCULAR ACCIDENT, HX OF 02/01/2007   Past Medical History  Diagnosis Date  . Obesity, unspecified   . Contact dermatitis and other eczema due to plants (except food)   . Other and unspecified hyperlipidemia   . Sacroiliitis, not elsewhere classified   . H/O: CVA (cardiovascular accident)     Carotid doppler neg (4/08)  . Unspecified essential hypertension   . Hematuria    Past Surgical History  Procedure Laterality Date  . Hernia repair      umbilical  . Carotid dopplers  4/08    Negative   History    Substance Use Topics  . Smoking status: Never Smoker   . Smokeless tobacco: Never Used  . Alcohol Use: Yes     Comment: occassionally   Family History  Problem Relation Age of Onset  . Hypertension Mother   . Diabetes Mother   . Hyperlipidemia      in family   . Cancer Neg Hx    Allergies  Allergen Reactions  . Benazepril Hcl     REACTION: cough   Current Outpatient Prescriptions on File Prior to Visit  Medication Sig Dispense Refill  . aspirin 325 MG tablet Take 325 mg by mouth daily.        Marland Kitchen olmesartan-hydrochlorothiazide (BENICAR HCT) 40-25 MG per tablet TAKE 1 TABLET DAILY  5 tablet  0  . zolpidem (AMBIEN) 5 MG tablet TAKE 1 TABLET BY MOUTH AT BEDTIME AS NEEDED FOR SLEEP  30 tablet  5   No current facility-administered medications on file prior to visit.    Review of Systems Review of Systems  Constitutional: Negative for fever, appetite change, fatigue and unexpected weight change.  Eyes: Negative for pain and visual disturbance.  Respiratory: Negative for cough and shortness of breath.   Cardiovascular: Negative for cp or palpitations  Gastrointestinal: Negative for nausea, diarrhea and constipation.  Genitourinary: Negative for urgency and frequency.  Skin: Negative for pallor or rash   MSK pos for R knee pain  Neurological: Negative for weakness, light-headedness, numbness and headaches.  Hematological: Negative for adenopathy. Does not bruise/bleed easily.  Psychiatric/Behavioral: Negative for dysphoric mood. The patient is not nervous/anxious.         Objective:   Physical Exam  Constitutional: He appears well-developed and well-nourished. No distress.  obese and well appearing   HENT:  Head: Normocephalic and atraumatic.  Mouth/Throat: Oropharynx is clear and moist.  Eyes: Conjunctivae and EOM are normal. Pupils are equal, round, and reactive to light. Right eye exhibits no discharge. Left eye exhibits no discharge. No scleral icterus.  Neck: Normal  range of motion. Neck supple. No JVD present. Carotid bruit is not present. No thyromegaly present.  Cardiovascular: Normal rate, regular rhythm, normal heart sounds and intact distal pulses.  Exam reveals no gallop.   Pulmonary/Chest: Effort normal and breath sounds normal. No respiratory distress. He has no wheezes. He exhibits no tenderness.  Abdominal: Soft. Bowel sounds are normal. He exhibits no distension, no abdominal bruit and no mass. There is no tenderness.  Musculoskeletal: He exhibits tenderness. He exhibits no edema.       Right knee: He exhibits decreased range of motion. He exhibits no swelling, no effusion, no ecchymosis, no deformity, no erythema, normal patellar mobility, no bony tenderness, normal meniscus and no MCL laxity. Tenderness found. Medial joint line and patellar tendon tenderness noted.  Pain to flex R knee past 90 degrees  No crepitus  Stable on drawer/ lachman test  Pes planus noted   Lymphadenopathy:    He has no cervical adenopathy.  Neurological: He is alert. He has normal reflexes. No cranial nerve deficit. He exhibits normal muscle tone. Coordination normal.  Skin: Skin is warm and dry. No rash noted. No erythema. No pallor.  Psychiatric: He has a normal mood and affect.          Assessment & Plan:   Problem List Items Addressed This Visit     Cardiovascular and Mediastinum   HYPERTENSION - Primary      bp in fair control at this time  BP Readings from Last 1 Encounters:  03/28/14 118/82   No changes needed Disc lifstyle change with low sodium diet and exercise  Lab rev  Enc wt loss       Endocrine   Diabetes type 2, controlled      Lab Results  Component Value Date   HGBA1C 6.2 03/22/2014   Overall stable - diet control  emph imp of wt loss - plan discussed  On ARB for renal prot        Other   HYPERLIPIDEMIA     Fair control with diet  Disc goals for lipids and reasons to control them Rev labs with pt- HDL and LDL are at  goal Rev low sat fat diet in detail     OBESITY     Discussed how this problem influences overall health and the risks it imposes  Reviewed plan for weight loss with lower calorie diet (via better food choices and also portion control or program like weight watchers) and exercise building up to or more than 30 minutes 5 days per week including some aerobic activity   Disc this in detail and imp to controlling DM/ hyperglycemia and also joint problems   >25 minutes spent in face to face  time with patient, >50% spent in counselling or coordination of care     Knee pain, right     Xray today  Recommend ice /elevation  Suspect poss OA -medial pain  Ped planus could add to this (? If orthotic made it worse or better by changing gait)    Relevant Orders      DG Knee Complete 4 Views Right (Completed)

## 2014-03-29 NOTE — Assessment & Plan Note (Signed)
Xray today  Recommend ice /elevation  Suspect poss OA -medial pain  Ped planus could add to this (? If orthotic made it worse or better by changing gait)

## 2014-03-29 NOTE — Assessment & Plan Note (Signed)
Lab Results  Component Value Date   HGBA1C 6.2 03/22/2014   Overall stable - diet control  emph imp of wt loss - plan discussed  On ARB for renal prot

## 2014-03-29 NOTE — Assessment & Plan Note (Signed)
bp in fair control at this time  BP Readings from Last 1 Encounters:  03/28/14 118/82   No changes needed Disc lifstyle change with low sodium diet and exercise  Lab rev  Enc wt loss

## 2014-03-29 NOTE — Assessment & Plan Note (Signed)
Discussed how this problem influences overall health and the risks it imposes  Reviewed plan for weight loss with lower calorie diet (via better food choices and also portion control or program like weight watchers) and exercise building up to or more than 30 minutes 5 days per week including some aerobic activity   Disc this in detail and imp to controlling DM/ hyperglycemia and also joint problems   >25 minutes spent in face to face time with patient, >50% spent in counselling or coordination of care

## 2014-03-29 NOTE — Assessment & Plan Note (Signed)
Fair control with diet  Disc goals for lipids and reasons to control them Rev labs with pt- HDL and LDL are at goal Rev low sat fat diet in detail

## 2014-04-04 ENCOUNTER — Institutional Professional Consult (permissible substitution): Payer: BC Managed Care – PPO | Admitting: Family Medicine

## 2014-04-05 ENCOUNTER — Institutional Professional Consult (permissible substitution): Payer: BC Managed Care – PPO | Admitting: Family Medicine

## 2014-04-11 ENCOUNTER — Ambulatory Visit: Payer: BC Managed Care – PPO | Admitting: Family Medicine

## 2014-04-11 DIAGNOSIS — Z0289 Encounter for other administrative examinations: Secondary | ICD-10-CM

## 2014-04-12 ENCOUNTER — Telehealth: Payer: Self-pay | Admitting: Family Medicine

## 2014-04-12 NOTE — Telephone Encounter (Signed)
Pt r/s to 9/2

## 2014-04-12 NOTE — Telephone Encounter (Signed)
Patient did not come for their scheduled appointment 04/11/14  Please let me know if the patient needs to be contacted immediately for follow up or if no follow up is necessary.

## 2014-04-19 ENCOUNTER — Ambulatory Visit: Payer: BC Managed Care – PPO | Admitting: Family Medicine

## 2014-04-19 DIAGNOSIS — Z0289 Encounter for other administrative examinations: Secondary | ICD-10-CM

## 2014-04-20 ENCOUNTER — Encounter: Payer: Self-pay | Admitting: Family Medicine

## 2014-04-20 ENCOUNTER — Telehealth: Payer: Self-pay | Admitting: Family Medicine

## 2014-04-20 NOTE — Telephone Encounter (Signed)
Patient did not come for their scheduled appointment 04/19/13   Please let me know if the patient needs to be contacted immediately for follow up or if no follow up is necessary.

## 2014-04-20 NOTE — Telephone Encounter (Signed)
The patient may follow-up entirely at his or her discretion.

## 2014-04-20 NOTE — Telephone Encounter (Signed)
Mailed no show letter

## 2014-07-23 ENCOUNTER — Telehealth: Payer: Self-pay

## 2014-07-23 MED ORDER — OLMESARTAN MEDOXOMIL-HCTZ 40-25 MG PO TABS: ORAL_TABLET | ORAL | Status: DC

## 2014-07-23 NOTE — Telephone Encounter (Signed)
PLEASE NOTE: All timestamps contained within this report are represented as [place] Standard Time. CONFIDENTIALTY NOTICE: This fax transmission is intended only for the addressee. It contains information that is legally privileged, confidential or otherwise protected from use or disclosure. If you are not the intended recipient, you are strictly prohibited from reviewing, disclosing, copying using or disseminating any of this information or taking any action in reliance on or regarding this information. If you have received this fax in error, please notify us immediately by telephone so that we can arrange for its return to [place]. Phone: [phone], Toll-Free: [phone], Fax: [phone] Page: 1 of 3 Call Id: [id number] [place] Patient Name: Sean Lee Gender: Male DOB: [birth year] Age: 53 Y 43 M 13 D Return Phone Number: [phone] (Primary) Address: City/State/Zip: [place] [address] Client [facility] Night - Client Client Site [facility] [place], [place] Contact Type Call Call Type Triage / Clinical Relationship To Patient Self Return Phone Number [phone] (Primary) Chief Complaint Prescription Refill or Medication Request (non symptomatic) Initial Comment Caller states he is needing a refill on his medication Nurse Assessment Nurse: Thad Ranger, RN, Langley Gauss Date/Time (Eastern Time): 07/22/2014 11:46:18 AM Please select the assessment type ---Refill Additional Documentation ---Caller reports Benicar last refill is unknown and he reports he does not have the information on the bottle with date of last refill but he thinks it was last filled in April, 2015. States he pours the med into a prev bottle and uses the old bottle. He states he was seen by PCP in July 2015. States [facility] reports they do not have profile of  use of Benicar anymore and [facility] does not have hx of use of the Benicar either. Needs refill on Benicar 40/25mg  po qd. States he has been out of the med x 1 wk and demands the on call MD be paged. Allergies: NDKA [facility] [place], [address], [phone] Does the patient have enough medication to last until the office opens? ---No Additional Documentation ---Advised w/o ability to call the pharm to verify the med information from prev fills, he would need to be eval in the [facility] or ER for the refill. Advised I can call the on call MD for order and return call. Verb understanding. Guidelines Guideline Title Affirmed Question Affirmed Notes Nurse Date/Time (Eastern Time) Disp. Time Eilene Ghazi Time) Disposition Final User 07/22/2014 11:26:09 AM Send To RN Personal Thad Ranger, RN, Langley Gauss 07/22/2014 12:00:34 PM Called On-Call Provider Carmon, RN, Langley Gauss Reason: Dr Crissie Sickles w/msg left on vmb to return call. 07/22/2014 12:01:17 PM Send To RN Personal Thad Ranger, RN, Denise 07/22/2014 12:21:18 PM Send To RN Personal Thad Ranger, RN, Langley Gauss 07/22/2014 2:46:39 PM Call Completed Thad Ranger, RN, [place] NOTE: All timestamps contained within this report are represented as [place] Standard Time. CONFIDENTIALTY NOTICE: This fax transmission is intended only for the addressee. It contains information that is legally privileged, confidential or otherwise protected from use or disclosure. If you are not the intended recipient, you are strictly prohibited from reviewing, disclosing, copying using or disseminating any of this information or taking any action in reliance on or regarding this information. If you have received this fax in error, please notify us immediately by telephone so that we can arrange for its return to [place]. Phone: [phone], Toll-Free: [phone], Fax: [phone] Page: 2 of 3 Call Id: [id number] [place]. Time Eilene Ghazi Time) Disposition  Final User 07/22/2014 3:02:50 PM Pharmacy Call Thad Ranger,  RN, Langley Gauss Reason: [facility] Benicar per VO. 07/22/2014 3:05:17 PM Call Completed Thad Ranger, RN, Langley Gauss 07/22/2014 12:01:02 PM Clinical Call Yes Thad Ranger, RN, Denise Verbal Orders/Maintenance Medications Medication Refill Route Dosage Regime Duration Admin Instructions User Name Benicar 40/25mg  po qd, #30, No refills. N/A Carmon, RN, Denise Comments User: Romeo Apple, RN Date/Time Eilene Ghazi Time): 07/22/2014 12:21:00 PM Recieved cb from Dr Anitra Lauth w/pt report given. States he will look pt up when he gets home to verify the Rx and call me back. User: Romeo Apple, RN Date/Time Eilene Ghazi Time): 07/22/2014 3:00:38 PM Recieved cb from Dr Anitra Lauth with VO recieved for Benicar 40/25mg  po qd, #30, No refills. Pt to make an appt with PCP for further refills needed. User: Romeo Apple, RN Date/Time Eilene Ghazi Time): 07/22/2014 3:04:38 PM Returned call to the pt and advised Benicar called in but needs to make an appt to be eval by MD. Verb understanding. PLEASE NOTE: All timestamps contained within this report are represented as [place] Standard Time. CONFIDENTIALTY NOTICE: This fax transmission is intended only for the addressee. It contains information that is legally privileged, confidential or otherwise protected from use or disclosure. If you are not the intended recipient, you are strictly prohibited from reviewing, disclosing, copying using or disseminating any of this information or taking any action in reliance on or regarding this information. If you have received this fax in error, please notify us immediately by telephone so that we can arrange for its return to [place]. Phone: [phone], Toll-Free: [phone], Fax: [phone] Page: 3 of 3 Call Id: [id number] [facility] [address], [place] [address] [phone] Fax: [phone] [place] - Night Date: 07/22/2014 From: QI Department To: [place], [place] Please sign the order for the approved drug(s) given by our call center nurse on your behalf. Fax to [phone] within 5 business days. Thank you. Date Eilene Ghazi Time): 07/22/2014 11:07:30 AM Triage RN: Romeo Apple, RN NAME: Sean Lee PHONE NUMBER: [phone] (Primary) BIRTHDATE: 31-Oct-1960 ADDRESS: CITY/STATE/ZIP: [place] [address] CALLER: Self NAME: Rx Given Medication Refill Route Dosage Regime Duration Admin Instructions Benicar 40/25mg  po qd, #30, No refills. N/A MD Signature Date

## 2014-07-23 NOTE — Telephone Encounter (Signed)
I will refill medication -sent to his pharmacy

## 2014-07-25 ENCOUNTER — Telehealth: Payer: Self-pay | Admitting: Family Medicine

## 2014-07-26 NOTE — Telephone Encounter (Signed)
Pt left v/m requesting status of benicar refill.left v/m requesting cb.

## 2014-08-07 NOTE — Telephone Encounter (Signed)
Spoke with pt and benicar refill taken care of.

## 2014-09-12 ENCOUNTER — Telehealth: Payer: Self-pay | Admitting: Family Medicine

## 2014-09-12 ENCOUNTER — Ambulatory Visit (INDEPENDENT_AMBULATORY_CARE_PROVIDER_SITE_OTHER): Payer: BLUE CROSS/BLUE SHIELD | Admitting: Family Medicine

## 2014-09-12 ENCOUNTER — Ambulatory Visit (HOSPITAL_COMMUNITY): Payer: BLUE CROSS/BLUE SHIELD | Attending: Cardiovascular Disease | Admitting: *Deleted

## 2014-09-12 ENCOUNTER — Encounter: Payer: Self-pay | Admitting: Family Medicine

## 2014-09-12 VITALS — BP 120/70 | HR 94 | Temp 98.4°F | Ht 73.0 in | Wt 266.5 lb

## 2014-09-12 DIAGNOSIS — M7989 Other specified soft tissue disorders: Secondary | ICD-10-CM

## 2014-09-12 DIAGNOSIS — M79605 Pain in left leg: Secondary | ICD-10-CM

## 2014-09-12 DIAGNOSIS — Z8673 Personal history of transient ischemic attack (TIA), and cerebral infarction without residual deficits: Secondary | ICD-10-CM | POA: Insufficient documentation

## 2014-09-12 DIAGNOSIS — I10 Essential (primary) hypertension: Secondary | ICD-10-CM | POA: Insufficient documentation

## 2014-09-12 MED ORDER — MELOXICAM 15 MG PO TABS: 15.0000 mg | ORAL_TABLET | Freq: Every day | ORAL | Status: DC

## 2014-09-12 MED ORDER — ACETAMINOPHEN-CODEINE #3 300-30 MG PO TABS: 1.0000 | ORAL_TABLET | Freq: Four times a day (QID) | ORAL | Status: DC | PRN

## 2014-09-12 NOTE — Telephone Encounter (Signed)
[facility] Patient Name: Sean Lee DOB: April 06, 1961 Initial Comment Caller states left leg is swollen, hurting, can hardly walk on it. Hurts after sitting too long. Nurse Assessment Nurse: Donalynn Furlong, RN, Myna Hidalgo Date/Time Eilene Ghazi Time): 09/12/2014 9:19:54 AM Confirm and document reason for call. If symptomatic, describe symptoms. ---Caller states left leg is swollen, hurting, can hardly walk on it. Hurts after sitting too long. Right side of knee "10" on pain scale, and point of most exquisite pain Has the patient traveled out of the country within the last 30 days? ---No Does the patient require triage? ---Yes Related visit to physician within the last 2 weeks? ---No Does the PT have any chronic conditions? (i.e. diabetes, asthma, etc.) ---Yes List chronic conditions. ---high blood pressure, takes meds Guidelines Guideline Title Affirmed Question Affirmed Notes Leg Swelling and Edema [1] Thigh, calf, or ankle swelling AND [2] only 1 side Final Disposition User See Physician within 4 Hours (or PCP triage) Donalynn Furlong, RN, Myna Hidalgo Comments Pt states he has an appt with Dr Jackie Plum at 2 pm and is going home as we speak to get ready for that appt

## 2014-09-12 NOTE — Patient Instructions (Signed)

## 2014-09-12 NOTE — Progress Notes (Signed)
Unilateral lower extremity venous duplex exam - left - negative for DVT

## 2014-09-12 NOTE — Progress Notes (Signed)
   Dr. Frederico Hamman T. Varie Machamer, MD, [facility] [place], [address] Phone: [phone] Fax: [phone]  09/12/2014  Patient: Sean Lee, MRN: [id number], DOB: December 02, 1960, 54 y.o.  Primary Physician:  Loura Pardon, MD  Chief Complaint: Leg Pain and Leg Swelling  Subjective:   Sean Lee is a 54 y.o. very pleasant male patient who presents with the following:  Pleasant gentleman with a history of CVA who presents with a worsening two-week history of left-sided leg swelling and pain. 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 He has no history of DVT or pulmonary embolism in the past. He does have a history of varicose veins. He has not had any trauma or injury.  A couple of weeks and every day it is getting worse.  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 Works 12-14 hours a day for 4 days a week.   Past Medical History, Surgical History, Social History, Family History, Problem List, Medications, and Allergies have been reviewed and updated if relevant.   GEN: No acute illnesses, no fevers, chills. GI: No n/v/d, eating normally Pulm: No SOB Interactive and getting along well at home.  Otherwise, ROS is as per the HPI.  Objective:   BP 120/70 mmHg  Pulse 94  Temp(Src) 98.4 F (36.9 C) (Oral)  Ht 6\' 1"  (1.854 m)  Wt 266 lb 8 oz (120.884 kg)  BMI 35.17 kg/m2  GEN: WDWN, NAD, Non-toxic, A & O x 3 HEENT: Atraumatic, Normocephalic. Neck supple. No masses, No LAD. Ears and Nose: No external deformity. CV: RRR, No M/G/R. No JVD. No thrill. No extra heart sounds. PULM: CTA B, no wheezes, crackles, rhonchi. No retractions. No resp. distress. No accessory muscle use. EXTR: 3+ LE edema on the left compared to none on the right. HOMAN'S Pos NEURO Normal gait.  PSYCH: Normally interactive. Conversant. Not depressed or anxious appearing.  Calm  demeanor.   Laboratory and Imaging Data:  Assessment and Plan:   Left leg pain - Plan: Lower Extremity Venous Duplex Left  Left leg swelling - Plan: Lower Extremity Venous Duplex Left  High concern for acute DVT. Obtain stat venous duplex.  Anticoagulate if positive.  Orders Placed This Encounter  Procedures  . Lower Extremity Venous Duplex Left    Signed,  Johna Kearl T. Avonne Berkery, MD   Patient's Medications  New Prescriptions   No medications on file  Previous Medications   ASPIRIN 325 MG TABLET    Take 325 mg by mouth daily.     BENICAR HCT 40-25 MG PER TABLET    TAKE 1 TABLET DAILY   ZOLPIDEM (AMBIEN) 5 MG TABLET    TAKE 1 TABLET BY MOUTH AT BEDTIME AS NEEDED FOR SLEEP  Modified Medications   No medications on file  Discontinued Medications   No medications on file

## 2014-09-12 NOTE — Telephone Encounter (Signed)
Tylenol #3 called to [place].

## 2014-09-12 NOTE — Telephone Encounter (Signed)
Please help:  Mobic 15 mg, 1 po daily, #30, 1 ref  Tylenol #3, 1 po q 6 hours prn pain, #20, 0 ref. (not while driving)  [address]

## 2014-09-12 NOTE — Progress Notes (Signed)
Pre visit review using our clinic review tool, if applicable. No additional management support is needed unless otherwise documented below in the visit note. 

## 2015-02-25 ENCOUNTER — Ambulatory Visit: Payer: BLUE CROSS/BLUE SHIELD | Admitting: Internal Medicine

## 2015-02-27 ENCOUNTER — Encounter (HOSPITAL_COMMUNITY): Payer: Self-pay | Admitting: Emergency Medicine

## 2015-02-27 ENCOUNTER — Emergency Department (HOSPITAL_COMMUNITY)
Admission: EM | Admit: 2015-02-27 | Discharge: 2015-02-27 | Disposition: A | Discharge status: Home / Self Care | Payer: BLUE CROSS/BLUE SHIELD | Attending: Emergency Medicine | Admitting: Emergency Medicine

## 2015-02-27 DIAGNOSIS — J029 Acute pharyngitis, unspecified: Secondary | ICD-10-CM | POA: Diagnosis present

## 2015-02-27 DIAGNOSIS — Z8739 Personal history of other diseases of the musculoskeletal system and connective tissue: Secondary | ICD-10-CM | POA: Diagnosis not present

## 2015-02-27 DIAGNOSIS — Z791 Long term (current) use of non-steroidal anti-inflammatories (NSAID): Secondary | ICD-10-CM | POA: Diagnosis not present

## 2015-02-27 DIAGNOSIS — J020 Streptococcal pharyngitis: Secondary | ICD-10-CM | POA: Diagnosis not present

## 2015-02-27 DIAGNOSIS — Z7982 Long term (current) use of aspirin: Secondary | ICD-10-CM | POA: Diagnosis not present

## 2015-02-27 DIAGNOSIS — Z872 Personal history of diseases of the skin and subcutaneous tissue: Secondary | ICD-10-CM | POA: Insufficient documentation

## 2015-02-27 DIAGNOSIS — Z8673 Personal history of transient ischemic attack (TIA), and cerebral infarction without residual deficits: Secondary | ICD-10-CM | POA: Diagnosis not present

## 2015-02-27 DIAGNOSIS — E669 Obesity, unspecified: Secondary | ICD-10-CM | POA: Diagnosis not present

## 2015-02-27 DIAGNOSIS — Z79899 Other long term (current) drug therapy: Secondary | ICD-10-CM | POA: Insufficient documentation

## 2015-02-27 DIAGNOSIS — I10 Essential (primary) hypertension: Secondary | ICD-10-CM | POA: Insufficient documentation

## 2015-02-27 LAB — RAPID STREP SCREEN (MED CTR MEBANE ONLY): Streptococcus, Group A Screen (Direct): NEGATIVE

## 2015-02-27 MED ORDER — DEXAMETHASONE SODIUM PHOSPHATE 10 MG/ML IJ SOLN
10.0000 mg | Freq: Once | INTRAMUSCULAR | Status: AC
  Administered 2015-02-27: 10 mg via INTRAMUSCULAR
  Filled 2015-02-27: qty 1

## 2015-02-27 MED ORDER — DEXAMETHASONE SODIUM PHOSPHATE 10 MG/ML IJ SOLN: 10.0000 mg | Freq: Once | INTRAMUSCULAR | Status: DC

## 2015-02-27 MED ORDER — HYDROCODONE-ACETAMINOPHEN 7.5-325 MG/15ML PO SOLN: 15.0000 mL | Freq: Three times a day (TID) | ORAL | Status: DC | PRN

## 2015-02-27 MED ORDER — PENICILLIN G BENZATHINE 1200000 UNIT/2ML IM SUSP
1.2000 10*6.[IU] | Freq: Once | INTRAMUSCULAR | Status: AC
  Administered 2015-02-27: 1.2 10*6.[IU] via INTRAMUSCULAR
  Filled 2015-02-27: qty 2

## 2015-02-27 NOTE — ED Notes (Signed)
Pt c/o sore throat and difficulty swallowing 3-4days.

## 2015-02-27 NOTE — ED Provider Notes (Signed)
CSN: [id number]     Arrival date & time 02/27/15  2024 History   This chart was scribed for Alvina Chou, PA-C working with Jola Schmidt, MD by Mercy Moore, ED Scribe. This patient was seen in room WTR7/WTR7 and the patient's care was started at 9:16 PM.   Chief Complaint  Patient presents with  . Sore Throat   The history is provided by the patient. No language interpreter was used.   HPI Comments: Sean Lee is a 54 y.o. male who presents to the Emergency Department complaining of throbbing sore throat and swollen tonsils, exacerbated with swallowing, ongoing for 3-4 days now. Patient reports attempted treatment with Tylenol 3, without relief. Patient reports associated ear pain and lowgrade fever; he denies additional cold symptoms. Patient denies sick contacts.   Past Medical History  Diagnosis Date  . Obesity, unspecified   . Contact dermatitis and other eczema due to plants (except food)   . Other and unspecified hyperlipidemia   . Sacroiliitis, not elsewhere classified   . H/O: CVA (cardiovascular accident)     Carotid doppler neg (4/08)  . Unspecified essential hypertension   . Hematuria    Past Surgical History  Procedure Laterality Date  . Hernia repair      umbilical  . Carotid dopplers  4/08    Negative   Family History  Problem Relation Age of Onset  . Hypertension Mother   . Diabetes Mother   . Hyperlipidemia      in family   . Cancer Neg Hx    History  Substance Use Topics  . Smoking status: Never Smoker   . Smokeless tobacco: Never Used  . Alcohol Use: Yes     Comment: occassionally    Review of Systems  Constitutional: Negative for fever and chills.  HENT: Positive for ear pain and sore throat. Negative for congestion, rhinorrhea and trouble swallowing.   Respiratory: Negative for cough.   All other systems reviewed and are negative.  Allergies  Benazepril hcl  Home Medications   Prior to Admission medications   Medication Sig Start  Date End Date Taking? Authorizing Provider  acetaminophen-codeine (TYLENOL #3) 300-30 MG per tablet Take 1 tablet by mouth every 6 (six) hours as needed for moderate pain. 09/12/14   Owens Loffler, MD  aspirin 325 MG tablet Take 325 mg by mouth daily.      Historical Provider, MD  BENICAR HCT 40-25 MG per tablet TAKE 1 TABLET DAILY 07/25/14   Abner Greenspan, MD  meloxicam (MOBIC) 15 MG tablet Take 1 tablet (15 mg total) by mouth daily. 09/12/14   Spencer Copland, MD  zolpidem (AMBIEN) 5 MG tablet TAKE 1 TABLET BY MOUTH AT BEDTIME AS NEEDED FOR SLEEP 02/09/14   Deneise Lever, MD   Triage Vitals: BP 132/84 mmHg  Pulse 112  Temp(Src) 99.2 F (37.3 C) (Oral)  Resp 20  SpO2 100% Physical Exam  Constitutional: He is oriented to person, place, and time. He appears well-developed and well-nourished. No distress.  HENT:  Head: Normocephalic and atraumatic.  Bilateral tonsillar edema and erythema without exudate.   Eyes: EOM are normal.  Neck: Neck supple. No tracheal deviation present.  Cardiovascular: Normal rate.   Pulmonary/Chest: Effort normal. No respiratory distress.  Abdominal: Soft. He exhibits no distension. There is no tenderness. There is no rebound.  Musculoskeletal: Normal range of motion.  Neurological: He is alert and oriented to person, place, and time. Coordination normal.  Skin: Skin is warm  and dry.  Psychiatric: He has a normal mood and affect. His behavior is normal.  Nursing note and vitals reviewed.   ED Course  Procedures (including critical care time)  COORDINATION OF CARE: 9:21 PM- Discussed treatment plan with patient at bedside and patient agreed to plan.   Labs Review Labs Reviewed - No data to display  Imaging Review No results found.   EKG Interpretation None      MDM   Final diagnoses:  Strep throat    Patient will be treated for strep throat based on clinical symptoms.   I personally performed the services described in this documentation,  which was scribed in my presence. The recorded information has been reviewed and is accurate.    [address] [place], PA-C 02/28/15 [id number]  Jola Schmidt, MD 03/01/15 [phone]

## 2015-02-27 NOTE — Discharge Instructions (Signed)
Return to the ED with worsening or concerning symptoms. Take liquid vicodin as needed for pain. Refer to attached documents for more information.

## 2015-03-02 LAB — CULTURE, GROUP A STREP: STREP A CULTURE: NEGATIVE

## 2015-05-13 ENCOUNTER — Other Ambulatory Visit: Payer: Self-pay | Admitting: Internal Medicine

## 2015-05-16 ENCOUNTER — Other Ambulatory Visit: Payer: Self-pay | Admitting: Internal Medicine

## 2015-05-18 ENCOUNTER — Other Ambulatory Visit: Payer: Self-pay | Admitting: Internal Medicine

## 2015-06-06 ENCOUNTER — Encounter: Payer: Self-pay | Admitting: Internal Medicine

## 2015-06-06 ENCOUNTER — Ambulatory Visit (INDEPENDENT_AMBULATORY_CARE_PROVIDER_SITE_OTHER): Payer: BLUE CROSS/BLUE SHIELD | Admitting: Internal Medicine

## 2015-06-06 ENCOUNTER — Other Ambulatory Visit: Payer: Self-pay | Admitting: Internal Medicine

## 2015-06-06 VITALS — BP 122/70 | HR 113 | Ht 73.0 in | Wt 269.4 lb

## 2015-06-06 DIAGNOSIS — Z23 Encounter for immunization: Secondary | ICD-10-CM

## 2015-06-06 DIAGNOSIS — E669 Obesity, unspecified: Secondary | ICD-10-CM | POA: Diagnosis not present

## 2015-06-06 DIAGNOSIS — G4733 Obstructive sleep apnea (adult) (pediatric): Secondary | ICD-10-CM | POA: Diagnosis not present

## 2015-06-06 MED ORDER — ZOLPIDEM TARTRATE 5 MG PO TABS: ORAL_TABLET | ORAL | Status: DC

## 2015-06-06 NOTE — Progress Notes (Signed)
01/30/13- 58 yom never smoker referred courtesy of Dr Glori Bickers for Sleep Medicine consultation. Had NPSG several years ago-not using CPAP. Now needs renewal of DOT CDL. Their screen including measurement of neck size required sleep apnea be ruled out now. He is aware of snoring some but his wife does not tell him of apnea. 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 Bedtime 8:30 PM, sleep latency 40 minutes, waking once before up at 3 AM. Gained 20 pounds in the last 2 years. Denies heart or lung problems but treated for hypertension. No ENT surgery, no history of nasal fracture  03/20/13- 52 yom never smoker referred courtesy of Dr Glori Bickers for Obstructive Sleep Apnea FOLLOWS FOR: Wears CPAP auto/ [facility] every night; mask "pops" off in hth middle of night and unsure how its happening. [facility] has not reached patient about getting a compliance report. Nasal mask, humidifier. Unattended home sleep study 02/11/2013-mild obstructive apnea, AHI 9.7 per hour with snoring, desaturation to 79%, weight 286 pounds.  06/06/15- 7 yom never smoker referred courtesy of Dr Glori Bickers for Obstructive Sleep Apnea CPAP auto [facility]   ambien FOLLOWS FOR:Not used CPAP for 6 mths. or more due to mask ends up on the floor Did fine with CPAP until he ran out of Ambien, which prevents his restlessness at night.  ROS-see HPI Constitutional:   No-   weight loss, night sweats, fevers, chills, fatigue, lassitude. HEENT:   No-  headaches, difficulty swallowing, tooth/dental problems, sore throat,       No-  sneezing, itching, ear ache, nasal congestion, post nasal drip,  CV:  No-   chest pain, orthopnea, PND, swelling in lower extremities, anasarca,  dizziness, palpitations Resp: No-   shortness of breath with exertion or at rest.              No-   productive cough,  No non-productive cough,  No- coughing up of blood.              No-   change in color of mucus.  No- wheezing.   Skin:  No-   rash or lesions. GI:  No-   heartburn, indigestion, abdominal pain, nausea, vomiting, GU: . MS:  No-   joint pain or swelling.   Neuro-     nothing unusual Psych:  No- change in mood or affect. No depression or anxiety.  No memory loss.  OBJ- Physical Exam General- Alert, Oriented, Affect-appropriate, Distress- none acute, + overweight, alert Skin- rash-none, lesions- none, excoriation- none Lymphadenopathy- none Head- atraumatic            Eyes- Gross vision intact, PERRLA, conjunctivae and secretions clear            Ears- Hearing, canals-normal            Nose- Clear, no-Septal dev, mucus, polyps, erosion, perforation             Throat- Mallampati III-IV , mucosa clear , drainage- none, tonsils- atrophic, + own teeth Neck- flexible , trachea midline, no stridor , thyroid nl, carotid no bruit Chest - symmetrical excursion , unlabored           Heart/CV- RRR , no murmur , no gallop  , no rub, nl s1 s2                           - JVD- none , edema- none, stasis changes- none, varices-  none           Lung- clear to P&A, wheeze- none, cough- none , dullness-none, rub- none           Chest wall-  Abd-  Br/ Gen/ Rectal- Not done, not indicated Extrem- cyanosis- none, clubbing, none, atrophy- none, strength- nl Neuro- grossly intact to observation

## 2015-06-06 NOTE — Patient Instructions (Signed)
Order- [facility]- Change CPAP auto range to 5-12      Dx OSA  Script printed for ambien.   Call for refill  When needed  Flu vax

## 2015-06-09 NOTE — Assessment & Plan Note (Signed)
Explained impact of body weight on sleep apnea again. Tried to motivate him to make more of an effort.

## 2015-06-09 NOTE — Assessment & Plan Note (Addendum)
Download documents adequate use and could control before he ran out of Ambien. He is motivated to continue. Plan-refill Ambien. Try reducing auto titration range for comfort to 5-12

## 2015-06-17 ENCOUNTER — Encounter: Payer: Self-pay | Admitting: Internal Medicine

## 2015-08-17 ENCOUNTER — Other Ambulatory Visit: Payer: Self-pay | Admitting: Family Medicine

## 2015-08-20 NOTE — Telephone Encounter (Signed)
Please schedule a winter f/u and refill until then 

## 2015-08-20 NOTE — Telephone Encounter (Signed)
Received refill request electronically Last office visit 03/28/14 Patient has cancelled and no showed for several appointments Is it okay to refill medication?

## 2015-08-21 NOTE — Telephone Encounter (Signed)
Pt notified Rx sent and to keep f/u appt with Dr. Glori Bickers

## 2015-08-21 NOTE — Telephone Encounter (Signed)
Pt is scheduled for next Wednesday 08/28/15. Pt states he has already paid for the rx through mail order but has not received the medicine. Pt request call back on cell phone. Please advise.

## 2015-08-21 NOTE — Telephone Encounter (Signed)
Left voicemail requesting pt to call office back 

## 2015-08-26 ENCOUNTER — Other Ambulatory Visit: Payer: Self-pay | Admitting: Family Medicine

## 2015-08-28 ENCOUNTER — Ambulatory Visit (INDEPENDENT_AMBULATORY_CARE_PROVIDER_SITE_OTHER): Payer: BLUE CROSS/BLUE SHIELD | Admitting: Family Medicine

## 2015-08-28 ENCOUNTER — Encounter: Payer: Self-pay | Admitting: Family Medicine

## 2015-08-28 VITALS — BP 148/78 | HR 67 | Temp 98.3°F | Ht 73.0 in | Wt 276.2 lb

## 2015-08-28 DIAGNOSIS — R739 Hyperglycemia, unspecified: Secondary | ICD-10-CM | POA: Diagnosis not present

## 2015-08-28 DIAGNOSIS — M7580 Other shoulder lesions, unspecified shoulder: Secondary | ICD-10-CM

## 2015-08-28 DIAGNOSIS — E669 Obesity, unspecified: Secondary | ICD-10-CM

## 2015-08-28 DIAGNOSIS — G479 Sleep disorder, unspecified: Secondary | ICD-10-CM | POA: Insufficient documentation

## 2015-08-28 DIAGNOSIS — Z1211 Encounter for screening for malignant neoplasm of colon: Secondary | ICD-10-CM

## 2015-08-28 DIAGNOSIS — E785 Hyperlipidemia, unspecified: Secondary | ICD-10-CM

## 2015-08-28 DIAGNOSIS — M778 Other enthesopathies, not elsewhere classified: Secondary | ICD-10-CM | POA: Insufficient documentation

## 2015-08-28 DIAGNOSIS — I10 Essential (primary) hypertension: Secondary | ICD-10-CM

## 2015-08-28 DIAGNOSIS — M7582 Other shoulder lesions, left shoulder: Secondary | ICD-10-CM

## 2015-08-28 MED ORDER — MELOXICAM 15 MG PO TABS
15.0000 mg | ORAL_TABLET | Freq: Every day | ORAL | Status: DC
Start: 2015-08-28 — End: 2016-05-06

## 2015-08-28 MED ORDER — ZOLPIDEM TARTRATE 10 MG PO TABS: 10.0000 mg | ORAL_TABLET | Freq: Every evening | ORAL | Status: DC | PRN

## 2015-08-28 MED ORDER — OLMESARTAN MEDOXOMIL-HCTZ 40-25 MG PO TABS: 1.0000 | ORAL_TABLET | Freq: Every day | ORAL | Status: DC

## 2015-08-28 NOTE — Progress Notes (Signed)
Pre visit review using our clinic review tool, if applicable. No additional management support is needed unless otherwise documented below in the visit note. 

## 2015-08-28 NOTE — Progress Notes (Signed)
Subjective:    Patient ID: Sean Lee, male    DOB: 09-Jul-1961, 55 y.o.   MRN: [id number]  HPI Here for f/u of chronic health problems as well as sleep disorder and shoulder pain    bp is up on first check  today  No cp or palpitations or headaches or edema  No side effects to medicines BP Readings from Last 3 Encounters:  08/28/15 148/78  06/06/15 122/70  02/27/15 132/84     Is taking benicar hct -was out of it for a week - that is why bp is up   Wt is up 7 lb  bmi of 36 Went overboard eating for the holidays  Exercise walking and riding bicycle  Back to a better diet   Hyperglycemia - overdue for labs/ A1C - ? If it will be up due to the holidays Soft drinks - stopped those now  Drinking plenty of water   Having trouble sleeping Wears cpap  Takes ambien - close to 7 pm - lies down to go to sleep   (5 mg) - takes it every day  Takes 30 minutes to go to sleep (on empty stomach)  Stays asleep until just around midnight- and then cannot go back to sleep  Gets up to start his day at 2:15 am - working long hours lately  Is generally a morning person  Avoids caffeine - occ a cup of coffee -not often at all Stopped soft drinks    R shoulder hurts Hurts worse to sleep on that side  Cannot raise over 90 degress  No injury known   Patient Active Problem List   Diagnosis Date Noted  . Sleep disorder 08/28/2015  . Shoulder tendonitis 08/28/2015  . Colon cancer screening 08/28/2015  . Knee pain, right 03/28/2014  . Trochanteric bursitis 09/08/2013  . Hyperglycemia 01/02/2013  . Varicose veins 03/21/2012  . Rectus diastasis of lower abdomen 03/21/2012  . Obstructive sleep apnea 10/15/2009  . OBESITY 09/02/2009  . Hyperlipidemia 02/02/2007  . HEMATURIA, MICROSCOPIC 02/02/2007  . Essential hypertension 02/01/2007  . CEREBROVASCULAR ACCIDENT, HX OF 02/01/2007   Past Medical History  Diagnosis Date  . Obesity, unspecified   . Contact dermatitis and other eczema  due to plants (except food)   . Other and unspecified hyperlipidemia   . Sacroiliitis, not elsewhere classified ([place])   . H/O: CVA (cardiovascular accident)     Carotid doppler neg (4/08)  . Unspecified essential hypertension   . Hematuria    Past Surgical History  Procedure Laterality Date  . Hernia repair      umbilical  . Carotid dopplers  4/08    Negative   Social History  Substance Use Topics  . Smoking status: Never Smoker   . Smokeless tobacco: Never Used  . Alcohol Use: 0.0 oz/week    0 Standard drinks or equivalent per week     Comment: occassionally   Family History  Problem Relation Age of Onset  . Hypertension Mother   . Diabetes Mother   . Hyperlipidemia      in family   . Cancer Neg Hx    Allergies  Allergen Reactions  . Benazepril Hcl     REACTION: cough   Current Outpatient Prescriptions on File Prior to Visit  Medication Sig Dispense Refill  . aspirin 325 MG tablet Take 325 mg by mouth daily.       No current facility-administered medications on file prior to visit.    Review  of Systems    Review of Systems  Constitutional: Negative for fever, appetite change, fatigue and unexpected weight change.  Eyes: Negative for pain and visual disturbance.  Respiratory: Negative for cough and shortness of breath.   Cardiovascular: Negative for cp or palpitations    Gastrointestinal: Negative for nausea, diarrhea and constipation.  Genitourinary: Negative for urgency and frequency.  Skin: Negative for pallor or rash   MSK pos for L shoulder pain  Neurological: Negative for weakness, light-headedness, numbness and headaches.  Hematological: Negative for adenopathy. Does not bruise/bleed easily.  Psychiatric/Behavioral: Negative for dysphoric mood. The patient is not nervous/anxious.  pos for problems sleeping     Objective:   Physical Exam  Constitutional: He appears well-developed and well-nourished. No distress.  obese and well appearing   HENT:    Head: Normocephalic and atraumatic.  Mouth/Throat: Oropharynx is clear and moist.  Eyes: Conjunctivae and EOM are normal. Pupils are equal, round, and reactive to light.  Neck: Normal range of motion. Neck supple. No JVD present. Carotid bruit is not present. No thyromegaly present.  Cardiovascular: Normal rate, regular rhythm, normal heart sounds and intact distal pulses.  Exam reveals no gallop.   Pulmonary/Chest: Effort normal and breath sounds normal. No respiratory distress. He has no wheezes. He has no rales.  No crackles  Abdominal: Soft. Bowel sounds are normal. He exhibits no distension, no abdominal bruit and no mass. There is no tenderness.  Musculoskeletal: He exhibits tenderness. He exhibits no edema.       Left shoulder: He exhibits decreased range of motion. He exhibits no tenderness, no bony tenderness, no swelling, no effusion, no crepitus and normal pulse.  L shoulder- pos Hawking and Neer tests Pain with int/ext rotation  Nl flex and extension  Nl grip / no neurol changes   Lymphadenopathy:    He has no cervical adenopathy.  Neurological: He is alert. He has normal strength and normal reflexes. No sensory deficit. He exhibits normal muscle tone.  Skin: Skin is warm and dry. No rash noted. No pallor.  Psychiatric: He has a normal mood and affect.          Assessment & Plan:   Problem List Items Addressed This Visit      Cardiovascular and Mediastinum   Essential hypertension - Primary    Here after being out of medication for a week BP Readings from Last 3 Encounters:  08/28/15 148/78  06/06/15 122/70  02/27/15 132/84   Will return after re starting it  Lab today Enc wt loss and better health habits       Relevant Medications   olmesartan-hydrochlorothiazide (BENICAR HCT) 40-25 MG tablet   Other Relevant Orders   CBC with Differential/Platelet   Comprehensive metabolic panel   TSH   Lipid panel     Musculoskeletal and Integument   Shoulder  tendonitis    With limited abduction  Trial of nsaid/mobic Ice prn  Relative rest Re check at f/u  Consider ref if not improved  Rev passive rom exercises         Other   Colon cancer screening    Ref for colonoscopy- first one       Relevant Orders   Ambulatory referral to Gastroenterology   Hyperglycemia    A1c today Disc imp of low glycemic diet and wt loss to prevent DM F/u planned to review labs       Relevant Orders   Hemoglobin A1c   Hyperlipidemia  Lab today Diet if fair Disc low sat fat diet to make some goals/also exercise      Relevant Medications   olmesartan-hydrochlorothiazide (BENICAR HCT) 40-25 MG tablet   OBESITY    Discussed how this problem influences overall health and the risks it imposes  Reviewed plan for weight loss with lower calorie diet (via better food choices and also portion control or program like weight watchers) and exercise building up to or more than 30 minutes 5 days per week including some aerobic activity         Sleep disorder    ambien 5 mg and sleep hygiene no longer working through the night Will try 10mg   If not imp - consider change to the CR version  Disc limiting caffeine

## 2015-08-28 NOTE — Patient Instructions (Addendum)
I think you have tendonitis of the shoulder  Start meloxicam 15 mg with food once daily  Use ice ( bag of frozen peas) for 10 minutes at a time every chance you get on the shoulder Try not to sleep on that side  BP is up because you are off your medicine  Start back on it  Lab today  Follow up with me in about 2 weeks to review lab results and see if you loose weight and see how BP and shoulder are   Increase ambien to 10 mg once daily - make sure it is on an empty stomach  Avoid caffeine   Stop up front on way out for colonoscopy referral

## 2015-08-29 LAB — CBC WITH DIFFERENTIAL/PLATELET
BASOS ABS: 0 10*3/uL (ref 0.0–0.1)
Basophils Relative: 0.4 % (ref 0.0–3.0)
EOS ABS: 0.1 10*3/uL (ref 0.0–0.7)
Eosinophils Relative: 1.7 % (ref 0.0–5.0)
HCT: 37 % — ABNORMAL LOW (ref 39.0–52.0)
Hemoglobin: 12.4 g/dL — ABNORMAL LOW (ref 13.0–17.0)
Lymphocytes Relative: 28.3 % (ref 12.0–46.0)
Lymphs Abs: 2.5 10*3/uL (ref 0.7–4.0)
MCHC: 33.5 g/dL (ref 30.0–36.0)
MCV: 98.1 fl (ref 78.0–100.0)
MONO ABS: 0.8 10*3/uL (ref 0.1–1.0)
Monocytes Relative: 9.4 % (ref 3.0–12.0)
NEUTROS ABS: 5.3 10*3/uL (ref 1.4–7.7)
NEUTROS PCT: 60.2 % (ref 43.0–77.0)
PLATELETS: 287 10*3/uL (ref 150.0–400.0)
RBC: 3.78 Mil/uL — ABNORMAL LOW (ref 4.22–5.81)
RDW: 13.5 % (ref 11.5–15.5)
WBC: 8.8 10*3/uL (ref 4.0–10.5)

## 2015-08-29 LAB — COMPREHENSIVE METABOLIC PANEL
ALT: 20 U/L (ref 0–53)
AST: 21 U/L (ref 0–37)
Albumin: 3.5 g/dL (ref 3.5–5.2)
Alkaline Phosphatase: 45 U/L (ref 39–117)
BILIRUBIN TOTAL: 0.9 mg/dL (ref 0.2–1.2)
BUN: 15 mg/dL (ref 6–23)
CALCIUM: 8.7 mg/dL (ref 8.4–10.5)
CO2: 27 mEq/L (ref 19–32)
Chloride: 104 mEq/L (ref 96–112)
Creatinine, Ser: 1.32 mg/dL (ref 0.40–1.50)
GFR: 72.49 mL/min (ref >60.00)
GLUCOSE: 90 mg/dL (ref 70–99)
POTASSIUM: 3.7 meq/L (ref 3.5–5.1)
Sodium: 138 mEq/L (ref 135–145)
Total Protein: 6.5 g/dL (ref 6.0–8.3)

## 2015-08-29 LAB — HEMOGLOBIN A1C: Hgb A1c MFr Bld: 5.8 % (ref 4.6–6.5)

## 2015-08-29 LAB — LIPID PANEL
CHOLESTEROL: 145 mg/dL (ref 0–200)
HDL: 52.5 mg/dL (ref >39.00)
LDL CALC: 74 mg/dL (ref 0–99)
NonHDL: 92.89
Total CHOL/HDL Ratio: 3
Triglycerides: 92 mg/dL (ref 0.0–149.0)
VLDL: 18.4 mg/dL (ref 0.0–40.0)

## 2015-08-29 LAB — TSH: TSH: 0.84 u[IU]/mL (ref 0.35–4.50)

## 2015-08-29 NOTE — Assessment & Plan Note (Signed)
Ref for colonoscopy- first one

## 2015-08-29 NOTE — Assessment & Plan Note (Signed)
Here after being out of medication for a week BP Readings from Last 3 Encounters:  08/28/15 148/78  06/06/15 122/70  02/27/15 132/84   Will return after re starting it  Lab today Enc wt loss and better health habits

## 2015-08-29 NOTE — Assessment & Plan Note (Signed)
With limited abduction  Trial of nsaid/mobic Ice prn  Relative rest Re check at f/u  Consider ref if not improved  Rev passive rom exercises

## 2015-08-29 NOTE — Assessment & Plan Note (Signed)
Lab today Diet if fair Disc low sat fat diet to make some goals/also exercise

## 2015-08-29 NOTE — Assessment & Plan Note (Signed)
Discussed how this problem influences overall health and the risks it imposes  Reviewed plan for weight loss with lower calorie diet (via better food choices and also portion control or program like weight watchers) and exercise building up to or more than 30 minutes 5 days per week including some aerobic activity    

## 2015-08-29 NOTE — Assessment & Plan Note (Signed)
A1c today Disc imp of low glycemic diet and wt loss to prevent DM F/u planned to review labs

## 2015-08-29 NOTE — Assessment & Plan Note (Signed)
ambien 5 mg and sleep hygiene no longer working through the night Will try 10mg   If not imp - consider change to the CR version  Disc limiting caffeine

## 2015-09-03 ENCOUNTER — Encounter: Payer: Self-pay | Admitting: Gastroenterology

## 2015-10-03 ENCOUNTER — Ambulatory Visit (AMBULATORY_SURGERY_CENTER): Payer: Self-pay | Admitting: *Deleted

## 2015-10-03 VITALS — Ht 73.0 in | Wt 266.2 lb

## 2015-10-03 DIAGNOSIS — Z1211 Encounter for screening for malignant neoplasm of colon: Secondary | ICD-10-CM

## 2015-10-03 MED ORDER — SUPREP BOWEL PREP KIT 17.5-3.13-1.6 GM/177ML PO SOLN: 1.0000 | Freq: Once | ORAL | Status: DC

## 2015-10-03 NOTE — Progress Notes (Signed)
Patient denies any allergies to egg or soy products. Patient denies complications with anesthesia/sedation.  Patient denies oxygen use at home and denies diet medications. Emmi instructions for colonoscopy explained and given to patient.  

## 2015-10-17 ENCOUNTER — Encounter: Payer: Self-pay | Admitting: Gastroenterology

## 2015-10-17 ENCOUNTER — Ambulatory Visit (AMBULATORY_SURGERY_CENTER): Payer: BLUE CROSS/BLUE SHIELD | Admitting: Gastroenterology

## 2015-10-17 VITALS — BP 125/75 | HR 73 | Temp 97.7°F | Resp 13 | Ht 73.0 in | Wt 276.0 lb

## 2015-10-17 DIAGNOSIS — D128 Benign neoplasm of rectum: Secondary | ICD-10-CM

## 2015-10-17 DIAGNOSIS — Z1211 Encounter for screening for malignant neoplasm of colon: Secondary | ICD-10-CM | POA: Diagnosis not present

## 2015-10-17 DIAGNOSIS — D129 Benign neoplasm of anus and anal canal: Secondary | ICD-10-CM

## 2015-10-17 DIAGNOSIS — D127 Benign neoplasm of rectosigmoid junction: Secondary | ICD-10-CM | POA: Diagnosis not present

## 2015-10-17 DIAGNOSIS — D125 Benign neoplasm of sigmoid colon: Secondary | ICD-10-CM

## 2015-10-17 MED ORDER — SODIUM CHLORIDE 0.9 % IV SOLN: 500.0000 mL | INTRAVENOUS | Status: DC

## 2015-10-17 NOTE — Op Note (Signed)
[facility]  [address]. [place], [id number]   COLONOSCOPY PROCEDURE REPORT  PATIENT: Sean Lee, Sean Lee  MR#: [id number] BIRTHDATE: Apr 08, 1961 , 40  yrs. old GENDER: male ENDOSCOPIST: Wilfrid Lund, MD REFERRED [id number] Tower, MD PROCEDURE DATE:  10/17/2015 PROCEDURE:   Colonoscopy, screening and Colonoscopy with snare polypectomy First Screening Colonoscopy - Avg.  risk and is 50 yrs.  old or older Yes.  Prior Negative Screening - Now for repeat screening. N/A  History of Adenoma - Now for follow-up colonoscopy & has been > or = to 3 yrs.  N/A  Polyps removed today? Yes ASA CLASS:   Class III INDICATIONS:Screening for colonic neoplasia and Colorectal Neoplasm Risk Assessment for this procedure is average risk. MEDICATIONS: Monitored anesthesia care, Propofol 400 mg IV, and Lidocaine  40 mg IV  DESCRIPTION OF PROCEDURE:   After the risks benefits and alternatives of the procedure were thoroughly explained, informed consent was obtained.  The digital rectal exam revealed no abnormalities of the rectum.   The LB [id number]  endoscope was introduced through the anus and advanced to the cecum, which was identified by both the appendix and ileocecal valve. No adverse events experienced.   The quality of the prep was excellent. (Suprep was used)  The instrument was then slowly withdrawn as the colon was fully examined. Estimated blood loss is zero unless otherwise noted in this procedure report.      COLON FINDINGS: There was mild diverticulosis noted in the right colon.   3 sessile polyps ranging from 2 to 42mm in size were found in the rectosigmoid colon.  Polypectomies were performed with a cold snare.  The resection was complete, the polyp tissue was completely retrieved and sent to histology.  Retroflexed views revealed no abnormalities. The time to cecum = 2.6 Withdrawal time = 13.9   The scope was withdrawn and the procedure completed. COMPLICATIONS:  There were no immediate complications.  ENDOSCOPIC IMPRESSION: 1.   There was mild diverticulosis noted in the right colon 2.   3 sessile polyps ranging from 2 to 10mm in size were found in the rectosigmoid colon; polypectomies were performed with a cold snare  RECOMMENDATIONS: No apsirin for 5 days Repeat colonoscopy interval dependent on pathology results.  eSigned:  Wilfrid Lund, MD 10/17/2015 9:18 AM   cc:

## 2015-10-17 NOTE — Progress Notes (Addendum)
Discharge instructions given to patient by Riki Sheer, LPN.  Good feedback from patient noted.

## 2015-10-17 NOTE — Progress Notes (Signed)
Called to room to assist during endoscopic procedure.  Patient ID and intended procedure confirmed with present staff. Received instructions for my participation in the procedure from the performing physician.  

## 2015-10-17 NOTE — Progress Notes (Signed)
Stable to RR 

## 2015-10-17 NOTE — Patient Instructions (Addendum)
No aspirin for 5 days.  YOU HAD AN ENDOSCOPIC PROCEDURE TODAY AT [facility]:   Refer to the procedure report that was given to you for any specific questions about what was found during the examination.  If the procedure report does not answer your questions, please call your gastroenterologist to clarify.  If you requested that your care partner not be given the details of your procedure findings, then the procedure report has been included in a sealed envelope for you to review at your convenience later.  YOU SHOULD EXPECT: Some feelings of bloating in the abdomen. Passage of more gas than usual.  Walking can help get rid of the air that was put into your GI tract during the procedure and reduce the bloating. If you had a lower endoscopy (such as a colonoscopy or flexible sigmoidoscopy) you may notice spotting of blood in your stool or on the toilet paper. If you underwent a bowel prep for your procedure, you may not have a normal bowel movement for a few days.  Please Note:  You might notice some irritation and congestion in your nose or some drainage.  This is from the oxygen used during your procedure.  There is no need for concern and it should clear up in a day or so.  SYMPTOMS TO REPORT IMMEDIATELY:   Following lower endoscopy (colonoscopy or flexible sigmoidoscopy):  Excessive amounts of blood in the stool  Significant tenderness or worsening of abdominal pains  Swelling of the abdomen that is new, acute  Fever of 100F or higher   For urgent or emergent issues, a gastroenterologist can be reached at any hour by calling [phone].   DIET: Your first meal following the procedure should be a small meal and then it is ok to progress to your normal diet. Heavy or fried foods are harder to digest and may make you feel nauseous or bloated.  Likewise, meals heavy in dairy and vegetables can increase bloating.  Drink plenty of fluids but you should avoid alcoholic  beverages for 24 hours.  Try to increase the fiber in your diet.  ACTIVITY:  You should plan to take it easy for the rest of today and you should NOT DRIVE or use heavy machinery until tomorrow (because of the sedation medicines used during the test).    FOLLOW UP: Our staff will call the number listed on your records the next business day following your procedure to check on you and address any questions or concerns that you may have regarding the information given to you following your procedure. If we do not reach you, we will leave a message.  However, if you are feeling well and you are not experiencing any problems, there is no need to return our call.  We will assume that you have returned to your regular daily activities without incident.  If any biopsies were taken you will be contacted by phone or by letter within the next 1-3 weeks.  Please call us at [phone] if you have not heard about the biopsies in 3 weeks.    SIGNATURES/CONFIDENTIALITY: You and/or your care partner have signed paperwork which will be entered into your electronic medical record.  These signatures attest to the fact that that the information above on your After Visit Summary has been reviewed and is understood.  Full responsibility of the confidentiality of this discharge information lies with you and/or your care-partner.  Thank-you for choosing [place] for your healthcare needs.

## 2015-10-18 ENCOUNTER — Encounter: Payer: Self-pay | Admitting: Family Medicine

## 2015-10-18 ENCOUNTER — Telehealth: Payer: Self-pay

## 2015-10-18 ENCOUNTER — Ambulatory Visit (INDEPENDENT_AMBULATORY_CARE_PROVIDER_SITE_OTHER): Payer: BLUE CROSS/BLUE SHIELD | Admitting: Family Medicine

## 2015-10-18 VITALS — BP 128/78 | HR 77 | Temp 97.5°F | Ht 73.0 in | Wt 267.5 lb

## 2015-10-18 DIAGNOSIS — E669 Obesity, unspecified: Secondary | ICD-10-CM | POA: Diagnosis not present

## 2015-10-18 DIAGNOSIS — R739 Hyperglycemia, unspecified: Secondary | ICD-10-CM

## 2015-10-18 DIAGNOSIS — I10 Essential (primary) hypertension: Secondary | ICD-10-CM

## 2015-10-18 DIAGNOSIS — G479 Sleep disorder, unspecified: Secondary | ICD-10-CM | POA: Diagnosis not present

## 2015-10-18 NOTE — Assessment & Plan Note (Signed)
Discussed how this problem influences overall health and the risks it imposes  Reviewed plan for weight loss with lower calorie diet (via better food choices and also portion control or program like weight watchers) and exercise building up to or more than 30 minutes 5 days per week including some aerobic activity   Commended on 9 lb down so far  Will keep working on diet and exercise

## 2015-10-18 NOTE — Progress Notes (Signed)
Subjective:    Patient ID: Sean Lee, male    DOB: 05/08/1961, 55 y.o.   MRN: [id number]  HPI Here for f/u of chronic medical problems  Wt is down 9 lb with bmi of 35  Lab Results  Component Value Date   HGBA1C 5.8 08/28/2015   bp is improved today -now back on medicine  No cp or palpitations or headaches or edema  No side effects to medicines  BP Readings from Last 3 Encounters:  10/18/15 128/78  10/17/15 125/75  08/28/15 148/78     Feels pretty good   Had his colonoscopy yesterday - glad to be done with it   Doing the same for eating and exercise overall  Regular walking  Plans to inc fiber now  Also drinking more water   For sleep disorder - 10 mg total of ambien helps much   Patient Active Problem List   Diagnosis Date Noted  . Sleep disorder 08/28/2015  . Shoulder tendonitis 08/28/2015  . Colon cancer screening 08/28/2015  . Knee pain, right 03/28/2014  . Trochanteric bursitis 09/08/2013  . Hyperglycemia 01/02/2013  . Varicose veins 03/21/2012  . Rectus diastasis of lower abdomen 03/21/2012  . Obstructive sleep apnea 10/15/2009  . OBESITY 09/02/2009  . Hyperlipidemia 02/02/2007  . HEMATURIA, MICROSCOPIC 02/02/2007  . Essential hypertension 02/01/2007  . CEREBROVASCULAR ACCIDENT, HX OF 02/01/2007   Past Medical History  Diagnosis Date  . Obesity, unspecified   . Contact dermatitis and other eczema due to plants (except food)   . Sacroiliitis, not elsewhere classified ([place])   . H/O: CVA (cardiovascular accident)     Carotid doppler neg (4/08)  . Unspecified essential hypertension   . Hematuria   . Sleep apnea     uses CPAP but not every night  . Other and unspecified hyperlipidemia     Hx - no medication/diet controll and weight loss   Past Surgical History  Procedure Laterality Date  . Hernia repair      umbilical  . Carotid dopplers  4/08    Negative   Social History  Substance Use Topics  . Smoking status: Never Smoker   .  Smokeless tobacco: Never Used  . Alcohol Use: 2.4 oz/week    4 Cans of beer per week     Comment: weekends   Family History  Problem Relation Age of Onset  . Hypertension Mother   . Diabetes Mother   . Hyperlipidemia      in family   . Cancer Neg Hx   . Esophageal cancer Neg Hx   . Rectal cancer Neg Hx   . Stomach cancer Neg Hx   . Colon cancer Neg Hx    Allergies  Allergen Reactions  . Benazepril Hcl     REACTION: cough   Current Outpatient Prescriptions on File Prior to Visit  Medication Sig Dispense Refill  . aspirin 325 MG tablet Take 325 mg by mouth daily.      . meloxicam (MOBIC) 15 MG tablet Take 1 tablet (15 mg total) by mouth daily. With food for shoulder pain 30 tablet 1  . olmesartan-hydrochlorothiazide (BENICAR HCT) 40-25 MG tablet Take 1 tablet by mouth daily. 90 tablet 3  . zolpidem (AMBIEN) 10 MG tablet Take 1 tablet (10 mg total) by mouth at bedtime as needed for sleep. 30 tablet 5   No current facility-administered medications on file prior to visit.    Review of Systems Review of Systems  Constitutional: Negative for  fever, appetite change, fatigue and unexpected weight change.  Eyes: Negative for pain and visual disturbance.  Respiratory: Negative for cough and shortness of breath.   Cardiovascular: Negative for cp or palpitations    Gastrointestinal: Negative for nausea, diarrhea and constipation.  Genitourinary: Negative for urgency and frequency.  Skin: Negative for pallor or rash   Neurological: Negative for weakness, light-headedness, numbness and headaches.  Hematological: Negative for adenopathy. Does not bruise/bleed easily.  Psychiatric/Behavioral: Negative for dysphoric mood. The patient is not nervous/anxious.         Objective:   Physical Exam  Constitutional: He appears well-developed and well-nourished. No distress.  obese and well appearing   HENT:  Head: Normocephalic and atraumatic.  Mouth/Throat: Oropharynx is clear and moist.    Eyes: Conjunctivae and EOM are normal. Pupils are equal, round, and reactive to light.  Neck: Normal range of motion. Neck supple. No JVD present. Carotid bruit is not present. No thyromegaly present.  Cardiovascular: Normal rate, regular rhythm, normal heart sounds and intact distal pulses.  Exam reveals no gallop.   Pulmonary/Chest: Effort normal and breath sounds normal. No respiratory distress. He has no wheezes. He has no rales.  No crackles  Abdominal: Soft. Bowel sounds are normal. He exhibits no distension, no abdominal bruit and no mass. There is no tenderness.  Musculoskeletal: He exhibits no edema.  Lymphadenopathy:    He has no cervical adenopathy.  Neurological: He is alert. He has normal reflexes.  Skin: Skin is warm and dry. No rash noted. No pallor.  Psychiatric: He has a normal mood and affect.          Assessment & Plan:   Problem List Items Addressed This Visit      Cardiovascular and Mediastinum   Essential hypertension - Primary    bp in fair control at this time  BP Readings from Last 1 Encounters:  10/18/15 128/78   No changes needed Disc lifstyle change with low sodium diet and exercise  Much improved back on his benicar hct  F/u 6 mo  Enc further wt loss         Other   OBESITY    Discussed how this problem influences overall health and the risks it imposes  Reviewed plan for weight loss with lower calorie diet (via better food choices and also portion control or program like weight watchers) and exercise building up to or more than 30 minutes 5 days per week including some aerobic activity   Commended on 9 lb down so far  Will keep working on diet and exercise       Sleep disorder    ambien 10 mg is working  Also sleep hygiene  Disc imp of exercise

## 2015-10-18 NOTE — Assessment & Plan Note (Signed)
bp in fair control at this time  BP Readings from Last 1 Encounters:  10/18/15 128/78   No changes needed Disc lifstyle change with low sodium diet and exercise  Much improved back on his benicar hct  F/u 6 mo  Enc further wt loss

## 2015-10-18 NOTE — Assessment & Plan Note (Signed)
Lab Results  Component Value Date   HGBA1C 5.8 08/28/2015   Will re check 6 mo and f/u Enc further wt loss

## 2015-10-18 NOTE — Patient Instructions (Signed)
Blood pressure and weight are better  Keep doing what you are doing  Keep loosing weight  Follow up in 6 months with labs prior

## 2015-10-18 NOTE — Telephone Encounter (Signed)
  Follow up Call-  Call back number 10/17/2015  Post procedure Call Back phone  # [phone] cell  Permission to leave phone message Yes     Patient questions:  Do you have a fever, pain , or abdominal swelling? No. Pain Score  0 *  Have you tolerated food without any problems? Yes.    Have you been able to return to your normal activities? Yes.    Do you have any questions about your discharge instructions: Diet   No. Medications  No. Follow up visit  No.  Do you have questions or concerns about your Care? No.  Actions: * If pain score is 4 or above: No action needed, pain <4.

## 2015-10-18 NOTE — Progress Notes (Signed)
Pre visit review using our clinic review tool, if applicable. No additional management support is needed unless otherwise documented below in the visit note. 

## 2015-10-18 NOTE — Assessment & Plan Note (Signed)
ambien 10 mg is working  Also sleep hygiene  Disc imp of exercise

## 2015-10-25 ENCOUNTER — Encounter: Payer: Self-pay | Admitting: Gastroenterology

## 2015-10-28 ENCOUNTER — Other Ambulatory Visit: Payer: Self-pay | Admitting: *Deleted

## 2015-10-28 ENCOUNTER — Telehealth: Payer: Self-pay | Admitting: Gastroenterology

## 2015-10-28 ENCOUNTER — Encounter: Payer: Self-pay | Admitting: Gastroenterology

## 2015-10-28 NOTE — Telephone Encounter (Signed)
Letter for work printed and handed to the pt

## 2016-05-06 ENCOUNTER — Encounter: Payer: Self-pay | Admitting: Family Medicine

## 2016-05-06 ENCOUNTER — Ambulatory Visit (INDEPENDENT_AMBULATORY_CARE_PROVIDER_SITE_OTHER): Payer: BLUE CROSS/BLUE SHIELD | Admitting: Family Medicine

## 2016-05-06 DIAGNOSIS — M549 Dorsalgia, unspecified: Secondary | ICD-10-CM | POA: Insufficient documentation

## 2016-05-06 DIAGNOSIS — M545 Low back pain, unspecified: Secondary | ICD-10-CM

## 2016-05-06 LAB — POC URINALSYSI DIPSTICK (AUTOMATED)
BILIRUBIN UA: NEGATIVE
Glucose, UA: NEGATIVE
Ketones, UA: NEGATIVE
Leukocytes, UA: NEGATIVE
Nitrite, UA: NEGATIVE
PH UA: 6
Protein, UA: NEGATIVE
RBC UA: NEGATIVE
Spec Grav, UA: >=1.03
Urobilinogen, UA: 0.2

## 2016-05-06 MED ORDER — CYCLOBENZAPRINE HCL 10 MG PO TABS: 10.0000 mg | ORAL_TABLET | Freq: Every evening | ORAL | 1 refills | Status: DC | PRN

## 2016-05-06 MED ORDER — MELOXICAM 15 MG PO TABS: 15.0000 mg | ORAL_TABLET | Freq: Every day | ORAL | 1 refills | Status: DC | PRN

## 2016-05-06 NOTE — Progress Notes (Signed)
Subjective:    Patient ID: Sean Lee, male    DOB: 04-Jul-1961, 55 y.o.   MRN: [id number]  HPI Here with back pain middle lower and also L side above hip  Thinks they are the same  Does not think it is a kidney stone  ? If pulled a muscle  Started 2-3 weeks ago - does not remember what he was doing  Cannot remember any trauma   For exercise-walks on treadmill and a park  No wt lifting    No problem passing urine   Worse after standing for a long time  Then reaching for something below him  Also hurts when lying in bed   Last kidney stone was when he was a teen   Pain does not radiate to either leg  No n/t or weakness   Does not take anything otc  Has meloxicam for his shoulder-has not used lately  Has not tried heat or ice   Mattress needs replacing   Results for orders placed or performed in visit on 05/06/16  POCT Urinalysis Dipstick (Automated)  Result Value Ref Range   Color, UA Yellow    Clarity, UA Clear    Glucose, UA Negative    Bilirubin, UA Negative    Ketones, UA Negative    Spec Grav, UA >=1.030    Blood, UA Negative    pH, UA 6.0    Protein, UA Negative    Urobilinogen, UA 0.2    Nitrite, UA Negative    Leukocytes, UA Negative Negative     Patient Active Problem List   Diagnosis Date Noted  . Back pain 05/06/2016  . Sleep disorder 08/28/2015  . Shoulder tendonitis 08/28/2015  . Colon cancer screening 08/28/2015  . Knee pain, right 03/28/2014  . Trochanteric bursitis 09/08/2013  . Hyperglycemia 01/02/2013  . Varicose veins 03/21/2012  . Rectus diastasis of lower abdomen 03/21/2012  . Obstructive sleep apnea 10/15/2009  . OBESITY 09/02/2009  . Hyperlipidemia 02/02/2007  . HEMATURIA, MICROSCOPIC 02/02/2007  . Essential hypertension 02/01/2007  . CEREBROVASCULAR ACCIDENT, HX OF 02/01/2007   Past Medical History:  Diagnosis Date  . Contact dermatitis and other eczema due to plants (except food)   . H/O: CVA (cardiovascular accident)     Carotid doppler neg (4/08)  . Hematuria   . Obesity, unspecified   . Other and unspecified hyperlipidemia    Hx - no medication/diet controll and weight loss  . Sacroiliitis, not elsewhere classified ([place])   . Sleep apnea    uses CPAP but not every night  . Unspecified essential hypertension    Past Surgical History:  Procedure Laterality Date  . Carotid dopplers  4/08   Negative  . HERNIA REPAIR     umbilical   Social History  Substance Use Topics  . Smoking status: Never Smoker  . Smokeless tobacco: Never Used  . Alcohol use 2.4 oz/week    4 Cans of beer per week     Comment: weekends   Family History  Problem Relation Age of Onset  . Hypertension Mother   . Diabetes Mother   . Hyperlipidemia      in family   . Cancer Neg Hx   . Esophageal cancer Neg Hx   . Rectal cancer Neg Hx   . Stomach cancer Neg Hx   . Colon cancer Neg Hx    Allergies  Allergen Reactions  . Benazepril Hcl     REACTION: cough   Current Outpatient  Prescriptions on File Prior to Visit  Medication Sig Dispense Refill  . aspirin 325 MG tablet Take 325 mg by mouth daily.      Marland Kitchen olmesartan-hydrochlorothiazide (BENICAR HCT) 40-25 MG tablet Take 1 tablet by mouth daily. 90 tablet 3  . zolpidem (AMBIEN) 10 MG tablet Take 1 tablet (10 mg total) by mouth at bedtime as needed for sleep. 30 tablet 5   No current facility-administered medications on file prior to visit.      Review of Systems Review of Systems  Constitutional: Negative for fever, appetite change, fatigue and unexpected weight change.  Eyes: Negative for pain and visual disturbance.  Respiratory: Negative for cough and shortness of breath.   Cardiovascular: Negative for cp or palpitations    Gastrointestinal: Negative for nausea, diarrhea and constipation.  Genitourinary: Negative for urgency and frequency. neg for loss of bladder or bowel control Skin: Negative for pallor or rash   MSK pos for back pain  Neurological: Negative  for weakness, light-headedness, numbness and headaches.  Hematological: Negative for adenopathy. Does not bruise/bleed easily.  Psychiatric/Behavioral: Negative for dysphoric mood. The patient is not nervous/anxious.         Objective:   Physical Exam  Constitutional: He appears well-developed and well-nourished. No distress.  obese and well appearing   HENT:  Head: Normocephalic and atraumatic.  Eyes: Conjunctivae and EOM are normal. Pupils are equal, round, and reactive to light. No scleral icterus.  Neck: Normal range of motion. Neck supple.  Cardiovascular: Normal rate and regular rhythm.   Pulmonary/Chest: Effort normal and breath sounds normal. He has no wheezes. He has no rales.  Abdominal: Soft. Bowel sounds are normal. He exhibits no distension and no mass. There is no tenderness. There is no rebound and no guarding.  No suprapubic tenderness or fullness    Musculoskeletal: He exhibits tenderness.       Right shoulder: He exhibits tenderness, bony tenderness and spasm. He exhibits normal range of motion, no swelling, no effusion, no crepitus, no deformity and normal pulse.       Lumbar back: He exhibits decreased range of motion, tenderness and spasm. He exhibits no bony tenderness and no edema.  Tender over low TS- mild   Tender over L SI area and piriformis (pain rep by piriformis stretch) Nl rom of hips Neg SLR No neuro changes Nl gait   No cva or abd tenderness  Pain on flex past 90 deg/ ext of 10 deg and L lat bend     Lymphadenopathy:    He has no cervical adenopathy.  Neurological: He is alert. He has normal strength and normal reflexes. He displays no atrophy. No cranial nerve deficit or sensory deficit. He exhibits normal muscle tone. Coordination normal.  Negative SLR  Skin: Skin is warm and dry. No rash noted. No erythema. No pallor.  Psychiatric: He has a normal mood and affect.          Assessment & Plan:   Problem List Items Addressed This Visit       Other   Back pain    Low back /left side/piriformis and SI area   Also lower thoracic-midline No CVA tenderness and UA is negative   Recommend stretches/heat meloxicam daily with food prn  Flexeril qhs New mattress if needed  rec core strengthening when this improves  Given info on back exercises and stretches Also taught piriformis stretching in office  Disc symptomatic care - see instructions on AVS  Update if not  starting to improve in a week or if worsening        Relevant Medications   cyclobenzaprine (FLEXERIL) 10 MG tablet   meloxicam (MOBIC) 15 MG tablet   Other Relevant Orders   POCT Urinalysis Dipstick (Automated) (Completed)    Other Visit Diagnoses   None.

## 2016-05-06 NOTE — Patient Instructions (Signed)
Think about getting a new mattress  Use heat on your painful areas for 10 minutes whenever you can  Take the meloxicam daily with food  Take the flexeril at bedtime  Update if not starting to improve in 1-2 weeks  or if worsening

## 2016-05-06 NOTE — Progress Notes (Signed)
Pre visit review using our clinic review tool, if applicable. No additional management support is needed unless otherwise documented below in the visit note. 

## 2016-05-07 NOTE — Assessment & Plan Note (Signed)
Low back /left side/piriformis and SI area   Also lower thoracic-midline No CVA tenderness and UA is negative   Recommend stretches/heat meloxicam daily with food prn  Flexeril qhs New mattress if needed  rec core strengthening when this improves  Given info on back exercises and stretches Also taught piriformis stretching in office  Disc symptomatic care - see instructions on AVS  Update if not starting to improve in a week or if worsening

## 2016-06-04 ENCOUNTER — Ambulatory Visit: Payer: BLUE CROSS/BLUE SHIELD | Admitting: Internal Medicine

## 2016-06-14 ENCOUNTER — Other Ambulatory Visit: Payer: Self-pay | Admitting: Family Medicine

## 2016-06-15 ENCOUNTER — Other Ambulatory Visit: Payer: Self-pay | Admitting: Family Medicine

## 2016-06-15 NOTE — Telephone Encounter (Signed)
Rx called in as prescribed 

## 2016-06-15 NOTE — Telephone Encounter (Signed)
Px written for call in   

## 2016-06-15 NOTE — Telephone Encounter (Signed)
Received refill electronically Last refill 08/28/15 #30/5 Last office visit 05/06/16

## 2016-10-05 ENCOUNTER — Telehealth: Payer: Self-pay | Admitting: Internal Medicine

## 2016-10-05 NOTE — Telephone Encounter (Signed)
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 Informed the pt of this, he contacted his employer and they will allow him to come in on Friday 10/09/16. The appointment has been made. Contacted [employer] to update airview so that we can obtain a download from the last 30 days, they stated it will be available 10/06/2016. Nothing further is needed at this time.

## 2016-10-08 ENCOUNTER — Encounter: Payer: Self-pay | Admitting: Adult Health

## 2016-10-08 ENCOUNTER — Ambulatory Visit (INDEPENDENT_AMBULATORY_CARE_PROVIDER_SITE_OTHER): Payer: BLUE CROSS/BLUE SHIELD | Admitting: Adult Health

## 2016-10-08 DIAGNOSIS — G4733 Obstructive sleep apnea (adult) (pediatric): Secondary | ICD-10-CM | POA: Diagnosis not present

## 2016-10-08 NOTE — Patient Instructions (Signed)
Continue on CPAP At bedtime   Wear for at least 4-6 hr each night  CPAP download in 4 weeks .  Work on weight loss.  Follow up in 4 weeks with CPAP download .  Please contact office for sooner follow up if symptoms do not improve or worsen or seek emergency care

## 2016-10-08 NOTE — Progress Notes (Signed)
@Patient  ID: Sean Lee, male    DOB: 10-20-1960, 56 y.o.   MRN: [id number]  Chief Complaint  Patient presents with  . Follow-up    OSA    Referring provider: Tower, Wynelle Fanny, MD  HPI: 56 yo male Never smoker followed for obstructive sleep apnea.  TEST  Unattended home sleep study 02/11/2013-mild obstructive apnea, AHI 9.7 per hour with snoring, desaturation to 79%, weight 286 pounds.    10/08/2016  Follow up : OSA  Patient returns for a follow-up for mild sleep apnea.. Patient is here to reestablish for his sleep apnea. 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  She says he has not been wearing CPAP on consistent basis.  He does not have 30 days of data of CPAP usage.  We discussed OSA and potential complications of untreated OSA . Benefits of CPAP .  Dot Unable to get download today . We have sent an order to DME for a download in 30 days.  He also needs a new mask , order sent to pharm.  Denies daytime sleepiness.   Last seen in office in 05/2015.   Allergies  Allergen Reactions  . Benazepril Hcl     REACTION: cough    Immunization History  Administered Date(s) Administered  . Influenza,inj,Quad PF,36+ Mos 05/08/2013, 06/06/2015  . Pneumococcal Polysaccharide-23 05/08/2013  . Td 02/02/2007    Past Medical History:  Diagnosis Date  . Contact dermatitis and other eczema due to plants (except food)   . H/O: CVA (cardiovascular accident)    Carotid doppler neg (4/08)  . Hematuria   . Obesity, unspecified   . Other and unspecified hyperlipidemia    Hx - no medication/diet controll and weight loss  . Sacroiliitis, not elsewhere classified ([place])   . Sleep apnea    uses CPAP but not every night  . Unspecified essential hypertension     Tobacco History: History  Smoking Status  . Never Smoker  Smokeless Tobacco  . Never Used   Counseling given: Not Answered   Outpatient  Encounter Prescriptions as of 10/08/2016  Medication Sig  . aspirin 325 MG tablet Take 325 mg by mouth daily.    . cyclobenzaprine (FLEXERIL) 10 MG tablet Take 1 tablet (10 mg total) by mouth at bedtime as needed for muscle spasms. Caution of sedation  . meloxicam (MOBIC) 15 MG tablet Take 1 tablet (15 mg total) by mouth daily as needed for pain. With food for pain  . olmesartan-hydrochlorothiazide (BENICAR HCT) 40-25 MG tablet Take 1 tablet by mouth daily.  Marland Kitchen zolpidem (AMBIEN) 10 MG tablet TAKE 1 TABLET BY MOUTH AT BEDTIME AS NEEDED FOR SLEEP   No facility-administered encounter medications on file as of 10/08/2016.      Review of Systems  Constitutional:   No  weight loss, night sweats,  Fevers, chills, fatigue, or  lassitude.  HEENT:   No headaches,  Difficulty swallowing,  Tooth/dental problems, or  Sore throat,                No sneezing, itching, ear ache, nasal congestion, post nasal drip,   CV:  No chest pain,  Orthopnea, PND, swelling in lower extremities, anasarca, dizziness, palpitations, syncope.   GI  No heartburn, indigestion, abdominal pain, nausea, vomiting, diarrhea, change in bowel habits, loss of appetite, bloody stools.   Resp: No shortness of breath with exertion or at rest.  No excess  mucus, no productive cough,  No non-productive cough,  No coughing up of blood.  No change in color of mucus.  No wheezing.  No chest wall deformity  Skin: no rash or lesions.  GU: no dysuria, change in color of urine, no urgency or frequency.  No flank pain, no hematuria   MS:  No joint pain or swelling.  No decreased range of motion.  No back pain.    Physical Exam  BP (!) 142/88 (BP Location: Left Arm, Cuff Size: Normal)   Pulse 96   Ht 5' 11.5" (1.816 m)   Wt 281 lb 9.6 oz (127.7 kg)   SpO2 95%   BMI 38.73 kg/m   GEN: A/Ox3; pleasant , NAD, obese    HEENT:  Cook/AT,  EACs-clear, TMs-wnl, NOSE-clear, THROAT-clear, no lesions, no postnasal drip or exudate noted. Class 2-3  MP airway   NECK:  Supple w/ fair ROM; no JVD; normal carotid impulses w/o bruits; no thyromegaly or nodules palpated; no lymphadenopathy.    RESP  Clear  P & A; w/o, wheezes/ rales/ or rhonchi. no accessory muscle use, no dullness to percussion  CARD:  RRR, no m/r/g, no peripheral edema, pulses intact, no cyanosis or clubbing.  GI:   Soft & nt; nml bowel sounds; no organomegaly or masses detected.   Musco: Warm bil, no deformities or joint swelling noted.   Neuro: alert, no focal deficits noted.    Skin: Warm, no lesions or rashes    Lab Results:  CBC   BNP No results found for: BNP  ProBNP No results found for: PROBNP  Imaging: No results found.   Assessment & Plan:   No problem-specific Assessment & Plan notes found for this encounter.     Rexene Edison, NP 10/08/2016

## 2016-10-09 ENCOUNTER — Ambulatory Visit: Payer: BLUE CROSS/BLUE SHIELD | Admitting: Adult Health

## 2016-10-12 ENCOUNTER — Telehealth: Payer: Self-pay | Admitting: Internal Medicine

## 2016-10-12 NOTE — Telephone Encounter (Signed)
TP  You saw this pt on 10/08/16 for DOT compliance, he brought in his download today and it is placed in your look at. 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 He says he is needing a letter about his compliance sent to [facility]   Office number [phone] Fax [phone]

## 2016-10-12 NOTE — Telephone Encounter (Signed)
I informed pt I have the download and I will get to TP. Please see other phone message.

## 2016-10-13 NOTE — Telephone Encounter (Signed)
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 Pt is going to do this and will call us back to let us know what they say. Will await call back.

## 2016-10-13 NOTE — Telephone Encounter (Signed)
Spoke with TP: download only contains 9 days' worth of data.  This is insufficient for DOT - we must have 30 days.  This was discussed at the visit last week.  Recommend that he file for an extension so that we can have sufficient data from his CPAP.   Per 2.22.18 ov: 10/08/2016  Dot  Not wearing on consistent basis  No download  Needs new mask

## 2016-10-13 NOTE — Telephone Encounter (Signed)
Pt calling back a/b his dot compliance.Sean Lee

## 2016-10-13 NOTE — Telephone Encounter (Signed)
Spoke with patient, aware that TP has his DL and we will call as soon as this is reviewed.  Patient states that someone from our office needs to contact Occupational Therapy as soon as possible once TP reviews his D/L and a copy of OV notes/telephone notes and D/L needs to be faxed to them.   Office number [phone] Fax [phone]  Please advise Rexene Edison, NP. Thanks.

## 2016-10-14 NOTE — Telephone Encounter (Signed)
Spoke with pt. States that we need to contact [facility] and try to get him a temporary medical card for his DOT. I have to leave a message to have someone from [facility] to call us back. I have made the pt aware that I have done this and we will wait to hear back from [place]. He was very appreciative of our help and apologized for bugging [place]. Advised him that he is no bother and we will do everything we can to try to help him.

## 2016-10-14 NOTE — Telephone Encounter (Signed)
Patient states DOT is giving him a hard time.  His boss states he cannot work tomorrow if this is not done.  CB [phone].

## 2016-10-15 NOTE — Telephone Encounter (Signed)
LM for Maria-Occupational Therapist to return call.

## 2016-10-16 NOTE — Telephone Encounter (Signed)
Spoke with Mardene Celeste and she stated the pt came into their office yesterday and they explained to him that he does not qualify for a temporary card due to him not having 30 days worth of data from his cpap machine. They have scheduled the pt to come back to their office in 30 days for a new physical and pt is aware to have a download when he comes in for that appointment. Nothing further is needed at this time.

## 2016-10-22 NOTE — Assessment & Plan Note (Signed)
Mild OSA  encouarged on CPAP usage and compliance   Plan  Patient Instructions  Continue on CPAP At bedtime   Wear for at least 4-6 hr each night  CPAP download in 4 weeks .  Work on weight loss.  Follow up in 4 weeks with CPAP download .  Please contact office for sooner follow up if symptoms do not improve or worsen or seek emergency care

## 2016-10-28 ENCOUNTER — Other Ambulatory Visit: Payer: Self-pay | Admitting: Family Medicine

## 2016-10-28 NOTE — Telephone Encounter (Signed)
Please schedule a late spring f/u for 30 min appt and refill until then  Thanks

## 2016-10-28 NOTE — Telephone Encounter (Signed)
Pt had an acute appt in 04/2016 but no recent f/u or CPE, please advise

## 2016-10-29 NOTE — Telephone Encounter (Signed)
appt scheduled and med filled  

## 2016-11-04 ENCOUNTER — Encounter: Payer: Self-pay | Admitting: Family Medicine

## 2016-11-04 ENCOUNTER — Ambulatory Visit (INDEPENDENT_AMBULATORY_CARE_PROVIDER_SITE_OTHER): Payer: BLUE CROSS/BLUE SHIELD | Admitting: Family Medicine

## 2016-11-04 VITALS — BP 122/74 | HR 83 | Temp 98.1°F | Ht 72.0 in | Wt 280.4 lb

## 2016-11-04 DIAGNOSIS — R739 Hyperglycemia, unspecified: Secondary | ICD-10-CM

## 2016-11-04 DIAGNOSIS — E669 Obesity, unspecified: Secondary | ICD-10-CM | POA: Diagnosis not present

## 2016-11-04 DIAGNOSIS — E7800 Pure hypercholesterolemia, unspecified: Secondary | ICD-10-CM | POA: Diagnosis not present

## 2016-11-04 DIAGNOSIS — Z23 Encounter for immunization: Secondary | ICD-10-CM

## 2016-11-04 DIAGNOSIS — L659 Nonscarring hair loss, unspecified: Secondary | ICD-10-CM | POA: Diagnosis not present

## 2016-11-04 DIAGNOSIS — I10 Essential (primary) hypertension: Secondary | ICD-10-CM | POA: Diagnosis not present

## 2016-11-04 MED ORDER — OLMESARTAN MEDOXOMIL-HCTZ 40-25 MG PO TABS: ORAL_TABLET | ORAL | 3 refills | Status: DC

## 2016-11-04 NOTE — Patient Instructions (Addendum)
Try to get lean protein with every meal - lean dairy products and meat and fish and nuts and beans and eggs are all good sources  Try to get your carbs from fruits and vegetables  Shop the outside of the supermarket   Keep exercising   Labs today  Don't forget to get an eye exam   Tdap vaccine today   We will call about a referral to dermatology

## 2016-11-04 NOTE — Progress Notes (Signed)
Subjective:    Patient ID: Sean Lee, male    DOB: 1961/03/10, 56 y.o.   MRN: [id number]  HPI Here for f/u of chronic medical problems and also hair loss  He has lost part of his facial hair (L side of mustache)- ? Alopecia  Not very stressed  Daughter in college -doing well   He is sore in R side after sneezing hard the other day  Nothing sticking out /does not think it is a hernia   Had a recent DOT physical - that went well    Wt Readings from Last 3 Encounters:  11/04/16 280 lb 6.4 oz (127.2 kg)  10/08/16 281 lb 9.6 oz (127.7 kg)  05/06/16 277 lb 4 oz (125.8 kg)  he gains and looses  Walking and drinking more water and riding bike  Then he will get off track and gain it back  Eating fair - cereal and fruit for bkfast/ graham crackers for lunch / dinner baked chicken/salmon and veggies  Does eat a lot of fruit  Stopped sodas and coffee  bmi 38.5  bp is stable today  No cp or palpitations or headaches or edema  No side effects to medicines  BP Readings from Last 3 Encounters:  11/04/16 122/74  10/08/16 (!) 142/88  05/06/16 124/78      Hx of hyperlipidemia Lab Results  Component Value Date   CHOL 145 08/28/2015   HDL 52.50 08/28/2015   LDLCALC 74 08/28/2015   TRIG 92.0 08/28/2015   CHOLHDL 3 08/28/2015   Has not taken any medicine for cholesterol   Due for labs -today , no fatty foods today    Hx of hyperglycemia Lab Results  Component Value Date   HGBA1C 5.8 08/28/2015  due for labs   Last eye exam -unsure -he is due for one   Due for tetanus shot (was 08)  Patient Active Problem List   Diagnosis Date Noted  . Controlled type 2 diabetes mellitus without complication, without long-term current use of insulin ([place]) 11/05/2016  . Alopecia 11/04/2016  . Back pain 05/06/2016  . Sleep disorder 08/28/2015  . Shoulder tendonitis 08/28/2015  . Colon cancer screening 08/28/2015  . Knee pain, right 03/28/2014  . Trochanteric bursitis 09/08/2013  .  Hyperglycemia 01/02/2013  . Varicose veins 03/21/2012  . Rectus diastasis of lower abdomen 03/21/2012  . Obstructive sleep apnea 10/15/2009  . Obesity (BMI 30-39.9) 09/02/2009  . Hyperlipidemia 02/02/2007  . HEMATURIA, MICROSCOPIC 02/02/2007  . Essential hypertension 02/01/2007  . CEREBROVASCULAR ACCIDENT, HX OF 02/01/2007   Past Medical History:  Diagnosis Date  . Contact dermatitis and other eczema due to plants (except food)   . H/O: CVA (cardiovascular accident)    Carotid doppler neg (4/08)  . Hematuria   . Obesity, unspecified   . Other and unspecified hyperlipidemia    Hx - no medication/diet controll and weight loss  . Sacroiliitis, not elsewhere classified ([place])   . Sleep apnea    uses CPAP but not every night  . Unspecified essential hypertension    Past Surgical History:  Procedure Laterality Date  . Carotid dopplers  4/08   Negative  . HERNIA REPAIR     umbilical   Social History  Substance Use Topics  . Smoking status: Never Smoker  . Smokeless tobacco: Never Used  . Alcohol use 2.4 oz/week    4 Cans of beer per week     Comment: weekends   Family History  Problem Relation  Age of Onset  . Hypertension Mother   . Diabetes Mother   . Hyperlipidemia      in family   . Cancer Neg Hx   . Esophageal cancer Neg Hx   . Rectal cancer Neg Hx   . Stomach cancer Neg Hx   . Colon cancer Neg Hx    Allergies  Allergen Reactions  . Benazepril Hcl     REACTION: cough   Current Outpatient Prescriptions on File Prior to Visit  Medication Sig Dispense Refill  . aspirin 325 MG tablet Take 325 mg by mouth daily.      . cyclobenzaprine (FLEXERIL) 10 MG tablet Take 1 tablet (10 mg total) by mouth at bedtime as needed for muscle spasms. Caution of sedation 30 tablet 1  . meloxicam (MOBIC) 15 MG tablet Take 1 tablet (15 mg total) by mouth daily as needed for pain. With food for pain 30 tablet 1  . zolpidem (AMBIEN) 10 MG tablet TAKE 1 TABLET BY MOUTH AT BEDTIME AS  NEEDED FOR SLEEP 30 tablet 5   No current facility-administered medications on file prior to visit.      Review of Systems Review of Systems  Constitutional: Negative for fever, appetite change, fatigue and unexpected weight change.  Eyes: Negative for pain and visual disturbance.  Respiratory: Negative for cough and shortness of breath.   Cardiovascular: Negative for cp or palpitations    Gastrointestinal: Negative for nausea, diarrhea and constipation.  Genitourinary: Negative for urgency and frequency.  Skin: Negative for pallor or rash  pos for focal hair loss in mustache Neurological: Negative for weakness, light-headedness, numbness and headaches. pos for funny sensation in R outer thigh after a hard sneeze Hematological: Negative for adenopathy. Does not bruise/bleed easily.  Psychiatric/Behavioral: Negative for dysphoric mood. The patient is not nervous/anxious.         Objective:   Physical Exam  Constitutional: He appears well-developed and well-nourished. No distress.  obese and well appearing   HENT:  Head: Normocephalic and atraumatic.  Mouth/Throat: Oropharynx is clear and moist.  Eyes: Conjunctivae and EOM are normal. Pupils are equal, round, and reactive to light.  Neck: Normal range of motion. Neck supple. No JVD present. Carotid bruit is not present. No thyromegaly present.  Cardiovascular: Normal rate, regular rhythm, normal heart sounds and intact distal pulses.  Exam reveals no gallop.   Pulmonary/Chest: Effort normal and breath sounds normal. No respiratory distress. He has no wheezes. He has no rales.  No crackles  Abdominal: Soft. Bowel sounds are normal. He exhibits no distension, no abdominal bruit and no mass. There is no tenderness.  Musculoskeletal: He exhibits no edema.  Lymphadenopathy:    He has no cervical adenopathy.  Neurological: He is alert. He has normal reflexes.  Skin: Skin is warm and dry. No rash noted.  Hair loss in L lat 1/2 of  mustache- no skin changes and no follicles or broken hairs noted  Nl scalp and hair on head   Psychiatric: He has a normal mood and affect.          Assessment & Plan:   Problem List Items Addressed This Visit      Cardiovascular and Mediastinum   Essential hypertension - Primary    bp in fair control at this time  BP Readings from Last 1 Encounters:  11/04/16 122/74   No changes needed Disc lifstyle change with low sodium diet and exercise  Labs today  Enc him to work on wt  loss        Relevant Medications   olmesartan-hydrochlorothiazide (BENICAR HCT) 40-25 MG tablet   Other Relevant Orders   CBC with Differential/Platelet (Completed)   Comprehensive metabolic panel (Completed)   Lipid panel (Completed)   TSH (Completed)     Musculoskeletal and Integument   Alopecia    L side of mustache-no follicles or broken hairs seen  Also no skin changes (except for a shaving scar) Suspect focal alopecia of ? Cause Ref made to dermatology       Relevant Orders   Ambulatory referral to Dermatology     Other   Hyperglycemia    Due for A1C At risk for DM disc imp of low glycemic diet and wt loss to prevent DM2  Enc wt loss       Relevant Orders   Hemoglobin A1c (Completed)   Hyperlipidemia    Due for lipid panel Diet controlled Disc goals for lipids and reasons to control them Rev labs with pt (from a year ago) Rev low sat fat diet in detail       Relevant Medications   olmesartan-hydrochlorothiazide (BENICAR HCT) 40-25 MG tablet   Other Relevant Orders   Lipid panel (Completed)   Obesity (BMI 30-39.9)    Discussed how this problem influences overall health and the risks it imposes  Reviewed plan for weight loss with lower calorie diet (via better food choices and also portion control or program like weight watchers) and exercise building up to or more than 30 minutes 5 days per week including some aerobic activity          Other Visit Diagnoses    Need  for diphtheria-tetanus-pertussis (Tdap) vaccine       Relevant Orders   Tdap vaccine greater than or equal to 7yo IM (Completed)

## 2016-11-04 NOTE — Progress Notes (Signed)
Pre visit review using our clinic review tool, if applicable. No additional management support is needed unless otherwise documented below in the visit note. 

## 2016-11-05 ENCOUNTER — Encounter: Payer: Self-pay | Admitting: Family Medicine

## 2016-11-05 DIAGNOSIS — E119 Type 2 diabetes mellitus without complications: Secondary | ICD-10-CM | POA: Insufficient documentation

## 2016-11-05 LAB — COMPREHENSIVE METABOLIC PANEL
ALK PHOS: 48 U/L (ref 39–117)
ALT: 26 U/L (ref 0–53)
AST: 22 U/L (ref 0–37)
Albumin: 4 g/dL (ref 3.5–5.2)
BUN: 15 mg/dL (ref 6–23)
CHLORIDE: 101 meq/L (ref 96–112)
CO2: 27 meq/L (ref 19–32)
Calcium: 9.5 mg/dL (ref 8.4–10.5)
Creatinine, Ser: 1.29 mg/dL (ref 0.40–1.50)
GFR: 74.12 mL/min (ref >60.00)
GLUCOSE: 139 mg/dL — AB (ref 70–99)
POTASSIUM: 3.8 meq/L (ref 3.5–5.1)
SODIUM: 134 meq/L — AB (ref 135–145)
Total Bilirubin: 0.6 mg/dL (ref 0.2–1.2)
Total Protein: 7.4 g/dL (ref 6.0–8.3)

## 2016-11-05 LAB — CBC WITH DIFFERENTIAL/PLATELET
BASOS ABS: 0.2 10*3/uL — AB (ref 0.0–0.1)
BASOS PCT: 1.3 % (ref 0.0–3.0)
EOS ABS: 0.2 10*3/uL (ref 0.0–0.7)
Eosinophils Relative: 2 % (ref 0.0–5.0)
HEMATOCRIT: 39.7 % (ref 39.0–52.0)
HEMOGLOBIN: 13.4 g/dL (ref 13.0–17.0)
LYMPHS PCT: 27 % (ref 12.0–46.0)
Lymphs Abs: 3.2 10*3/uL (ref 0.7–4.0)
MCHC: 33.9 g/dL (ref 30.0–36.0)
MCV: 96.1 fl (ref 78.0–100.0)
MONO ABS: 1.1 10*3/uL — AB (ref 0.1–1.0)
Monocytes Relative: 9 % (ref 3.0–12.0)
Neutro Abs: 7.1 10*3/uL (ref 1.4–7.7)
Neutrophils Relative %: 60.7 % (ref 43.0–77.0)
Platelets: 309 10*3/uL (ref 150.0–400.0)
RBC: 4.13 Mil/uL — ABNORMAL LOW (ref 4.22–5.81)
RDW: 12.6 % (ref 11.5–15.5)
WBC: 11.7 10*3/uL — AB (ref 4.0–10.5)

## 2016-11-05 LAB — LIPID PANEL
CHOL/HDL RATIO: 3
Cholesterol: 134 mg/dL (ref 0–200)
HDL: 44.4 mg/dL (ref >39.00)
LDL CALC: 70 mg/dL (ref 0–99)
NONHDL: 89.51
Triglycerides: 97 mg/dL (ref 0.0–149.0)
VLDL: 19.4 mg/dL (ref 0.0–40.0)

## 2016-11-05 LAB — TSH: TSH: 1.1 u[IU]/mL (ref 0.35–4.50)

## 2016-11-05 LAB — HEMOGLOBIN A1C: Hgb A1c MFr Bld: 6.5 % (ref 4.6–6.5)

## 2016-11-05 NOTE — Assessment & Plan Note (Signed)
Due for A1C At risk for DM disc imp of low glycemic diet and wt loss to prevent DM2  Enc wt loss

## 2016-11-05 NOTE — Assessment & Plan Note (Signed)
Due for lipid panel Diet controlled Disc goals for lipids and reasons to control them Rev labs with pt (from a year ago) Rev low sat fat diet in detail

## 2016-11-05 NOTE — Assessment & Plan Note (Signed)
Discussed how this problem influences overall health and the risks it imposes  Reviewed plan for weight loss with lower calorie diet (via better food choices and also portion control or program like weight watchers) and exercise building up to or more than 30 minutes 5 days per week including some aerobic activity    

## 2016-11-05 NOTE — Assessment & Plan Note (Signed)
L side of mustache-no follicles or broken hairs seen  Also no skin changes (except for a shaving scar) Suspect focal alopecia of ? Cause Ref made to dermatology

## 2016-11-05 NOTE — Assessment & Plan Note (Signed)
bp in fair control at this time  BP Readings from Last 1 Encounters:  11/04/16 122/74   No changes needed Disc lifstyle change with low sodium diet and exercise  Labs today  Enc him to work on wt loss

## 2016-11-12 ENCOUNTER — Ambulatory Visit: Payer: BLUE CROSS/BLUE SHIELD | Admitting: Adult Health

## 2016-11-18 DIAGNOSIS — L638 Other alopecia areata: Secondary | ICD-10-CM | POA: Diagnosis not present

## 2016-11-26 ENCOUNTER — Ambulatory Visit: Payer: BLUE CROSS/BLUE SHIELD | Admitting: Adult Health

## 2016-12-04 ENCOUNTER — Other Ambulatory Visit: Payer: Self-pay

## 2016-12-04 ENCOUNTER — Telehealth: Payer: Self-pay | Admitting: Family Medicine

## 2016-12-04 NOTE — Telephone Encounter (Signed)
Original px was on 10/30- is this too early for request?- let me know

## 2016-12-04 NOTE — Telephone Encounter (Signed)
[facility] [address] left v/m requesting 90 day rx for ambien. Last refilled # 30 x 5 on 06/15/16 and pt last seen 11/04/16.Please advise.

## 2016-12-14 ENCOUNTER — Ambulatory Visit: Payer: BLUE CROSS/BLUE SHIELD | Admitting: Adult Health

## 2016-12-16 DIAGNOSIS — L638 Other alopecia areata: Secondary | ICD-10-CM | POA: Diagnosis not present

## 2016-12-23 ENCOUNTER — Encounter: Payer: Self-pay | Admitting: Adult Health

## 2016-12-23 ENCOUNTER — Ambulatory Visit (INDEPENDENT_AMBULATORY_CARE_PROVIDER_SITE_OTHER): Payer: BLUE CROSS/BLUE SHIELD | Admitting: Adult Health

## 2016-12-23 DIAGNOSIS — G4733 Obstructive sleep apnea (adult) (pediatric): Secondary | ICD-10-CM

## 2016-12-23 NOTE — Progress Notes (Signed)
@Patient  ID: Corrie Dandy, male    DOB: May 19, 1961, 56 y.o.   MRN: [id number]  Chief Complaint  Patient presents with  . Follow-up    OSA     Referring provider: Tower, Wynelle Fanny, MD  HPI: 56 year old male never smoker followed for obstructive sleep apnea.  TEST  Unattended home sleep study 02/11/2013-mild obstructive apnea, AHI 9.7 per hour with snoring, desaturation to 79%, weight 286 pounds.  12/23/2016 follow up : OSA Patient returns for a follow-up for sleep apnea. He says he is doing well now on C Pap. He's recently restarted using. Patient's download shows excellent compliance with average usage at around 7.5 hours. Patient is on AutoSet 5-20 cm H2O. AHI 1.2. Minimum leaks. Patient says he feels rested with no daytime sleepiness     Allergies  Allergen Reactions  . Benazepril Hcl     REACTION: cough    Immunization History  Administered Date(s) Administered  . Influenza,inj,Quad PF,36+ Mos 05/08/2013, 06/06/2015  . Pneumococcal Polysaccharide-23 05/08/2013  . Td 02/02/2007  . Tdap 11/04/2016    Past Medical History:  Diagnosis Date  . Contact dermatitis and other eczema due to plants (except food)   . H/O: CVA (cardiovascular accident)    Carotid doppler neg (4/08)  . Hematuria   . Obesity, unspecified   . Other and unspecified hyperlipidemia    Hx - no medication/diet controll and weight loss  . Sacroiliitis, not elsewhere classified ([place])   . Sleep apnea    uses CPAP but not every night  . Unspecified essential hypertension     Tobacco History: History  Smoking Status  . Never Smoker  Smokeless Tobacco  . Never Used   Counseling given: Not Answered   Outpatient Encounter Prescriptions as of 12/23/2016  Medication Sig  . aspirin 325 MG tablet Take 325 mg by mouth daily.    . cyclobenzaprine (FLEXERIL) 10 MG tablet Take 1 tablet (10 mg total) by mouth at bedtime as needed for muscle spasms. Caution of sedation  . meloxicam (MOBIC) 15 MG tablet  Take 1 tablet (15 mg total) by mouth daily as needed for pain. With food for pain  . olmesartan-hydrochlorothiazide (BENICAR HCT) 40-25 MG tablet TABLET EVERY DAY  . zolpidem (AMBIEN) 10 MG tablet TAKE 1 TABLET BY MOUTH AT BEDTIME AS NEEDED FOR SLEEP   No facility-administered encounter medications on file as of 12/23/2016.      Review of Systems  Constitutional:   No  weight loss, night sweats,  Fevers, chills, fatigue, or  lassitude.  HEENT:   No headaches,  Difficulty swallowing,  Tooth/dental problems, or  Sore throat,                No sneezing, itching, ear ache, nasal congestion, post nasal drip,   CV:  No chest pain,  Orthopnea, PND, swelling in lower extremities, anasarca, dizziness, palpitations, syncope.   GI  No heartburn, indigestion, abdominal pain, nausea, vomiting, diarrhea, change in bowel habits, loss of appetite, bloody stools.   Resp: No shortness of breath with exertion or at rest.  No excess mucus, no productive cough,  No non-productive cough,  No coughing up of blood.  No change in color of mucus.  No wheezing.  No chest wall deformity  Skin: no rash or lesions.  GU: no dysuria, change in color of urine, no urgency or frequency.  No flank pain, no hematuria   MS:  No joint pain or swelling.  No decreased range of motion.  No back pain.    Physical Exam  BP 132/74 (BP Location: Left Arm, Cuff Size: Normal)   Pulse 85   Ht 6\' 1"  (1.854 m)   Wt 278 lb 6.4 oz (126.3 kg)   SpO2 98%   BMI 36.73 kg/m   GEN: A/Ox3; pleasant , NAD, obese    HEENT:  [place]/AT,  EACs-clear, TMs-wnl, NOSE-clear, THROAT-clear, no lesions, no postnasal drip or exudate noted. Class 2 MP airway   NECK:  Supple w/ fair ROM; no JVD; normal carotid impulses w/o bruits; no thyromegaly or nodules palpated; no lymphadenopathy.    RESP  Clear  P & A; w/o, wheezes/ rales/ or rhonchi. no accessory muscle use, no dullness to percussion  CARD:  RRR, no m/r/g, no peripheral edema, pulses intact,  no cyanosis or clubbing.  GI:   Soft & nt; nml bowel sounds; no organomegaly or masses detected.   Musco: Warm bil, no deformities or joint swelling noted.   Neuro: alert, no focal deficits noted.    Skin: Warm, no lesions or rashes    Lab Results:  CBC  BNP No results found for: BNP  ProBNP No results found for: PROBNP  Imaging: No results found.   Assessment & Plan:   Obstructive sleep apnea Excellent control on C Pap  Plan  Patient Instructions  Continue on CPAP At bedtime   Wear for at least 4-6 hr each night  Work on weight loss.  Follow up in 1 year and As needed   Please contact office for sooner follow up if symptoms do not improve or worsen or seek emergency care         Rexene Edison, NP 12/23/2016

## 2016-12-23 NOTE — Patient Instructions (Signed)
Continue on CPAP At bedtime   Wear for at least 4-6 hr each night  Work on weight loss.  Follow up in 1 year and As needed   Please contact office for sooner follow up if symptoms do not improve or worsen or seek emergency care

## 2016-12-23 NOTE — Assessment & Plan Note (Signed)
Excellent control on C Pap  Plan  Patient Instructions  Continue on CPAP At bedtime   Wear for at least 4-6 hr each night  Work on weight loss.  Follow up in 1 year and As needed   Please contact office for sooner follow up if symptoms do not improve or worsen or seek emergency care

## 2017-01-29 ENCOUNTER — Ambulatory Visit: Payer: BLUE CROSS/BLUE SHIELD | Admitting: Family Medicine

## 2017-02-09 ENCOUNTER — Other Ambulatory Visit: Payer: Self-pay | Admitting: Family Medicine

## 2017-02-11 ENCOUNTER — Telehealth: Payer: Self-pay | Admitting: Family Medicine

## 2017-02-11 DIAGNOSIS — D72829 Elevated white blood cell count, unspecified: Secondary | ICD-10-CM

## 2017-02-11 DIAGNOSIS — I10 Essential (primary) hypertension: Secondary | ICD-10-CM

## 2017-02-11 DIAGNOSIS — E119 Type 2 diabetes mellitus without complications: Secondary | ICD-10-CM

## 2017-02-11 NOTE — Telephone Encounter (Signed)
-----   Message from Ellamae Sia sent at 02/10/2017  3:19 PM EDT ----- Regarding: Lab orders for Thursday, 7.5.18 Lab orders for a f/u appt

## 2017-02-16 ENCOUNTER — Other Ambulatory Visit (INDEPENDENT_AMBULATORY_CARE_PROVIDER_SITE_OTHER): Payer: BLUE CROSS/BLUE SHIELD

## 2017-02-16 DIAGNOSIS — D72829 Elevated white blood cell count, unspecified: Secondary | ICD-10-CM | POA: Diagnosis not present

## 2017-02-16 DIAGNOSIS — E119 Type 2 diabetes mellitus without complications: Secondary | ICD-10-CM

## 2017-02-16 DIAGNOSIS — I10 Essential (primary) hypertension: Secondary | ICD-10-CM | POA: Diagnosis not present

## 2017-02-16 LAB — CBC WITH DIFFERENTIAL/PLATELET
BASOS ABS: 0 10*3/uL (ref 0.0–0.1)
Basophils Relative: 0.4 % (ref 0.0–3.0)
EOS ABS: 0.1 10*3/uL (ref 0.0–0.7)
Eosinophils Relative: 0.8 % (ref 0.0–5.0)
HCT: 41.7 % (ref 39.0–52.0)
Hemoglobin: 14.3 g/dL (ref 13.0–17.0)
LYMPHS ABS: 2.2 10*3/uL (ref 0.7–4.0)
Lymphocytes Relative: 29.6 % (ref 12.0–46.0)
MCHC: 34.3 g/dL (ref 30.0–36.0)
MCV: 96.9 fl (ref 78.0–100.0)
Monocytes Absolute: 0.8 10*3/uL (ref 0.1–1.0)
Monocytes Relative: 10.4 % (ref 3.0–12.0)
NEUTROS ABS: 4.5 10*3/uL (ref 1.4–7.7)
NEUTROS PCT: 58.8 % (ref 43.0–77.0)
PLATELETS: 275 10*3/uL (ref 150.0–400.0)
RBC: 4.31 Mil/uL (ref 4.22–5.81)
RDW: 12.9 % (ref 11.5–15.5)
WBC: 7.6 10*3/uL (ref 4.0–10.5)

## 2017-02-16 LAB — COMPREHENSIVE METABOLIC PANEL
ALT: 29 U/L (ref 0–53)
AST: 21 U/L (ref 0–37)
Albumin: 3.8 g/dL (ref 3.5–5.2)
Alkaline Phosphatase: 42 U/L (ref 39–117)
BILIRUBIN TOTAL: 0.9 mg/dL (ref 0.2–1.2)
BUN: 18 mg/dL (ref 6–23)
CHLORIDE: 100 meq/L (ref 96–112)
CO2: 30 meq/L (ref 19–32)
CREATININE: 1.27 mg/dL (ref 0.40–1.50)
Calcium: 9.3 mg/dL (ref 8.4–10.5)
GFR: 75.39 mL/min (ref >60.00)
GLUCOSE: 145 mg/dL — AB (ref 70–99)
Potassium: 3.6 mEq/L (ref 3.5–5.1)
Sodium: 136 mEq/L (ref 135–145)
Total Protein: 7.2 g/dL (ref 6.0–8.3)

## 2017-02-16 LAB — HEMOGLOBIN A1C: Hgb A1c MFr Bld: 6 % (ref 4.6–6.5)

## 2017-02-18 ENCOUNTER — Other Ambulatory Visit: Payer: BLUE CROSS/BLUE SHIELD

## 2017-02-24 ENCOUNTER — Ambulatory Visit: Payer: BLUE CROSS/BLUE SHIELD | Admitting: Family Medicine

## 2017-03-03 ENCOUNTER — Ambulatory Visit: Payer: BLUE CROSS/BLUE SHIELD | Admitting: Family Medicine

## 2017-03-17 ENCOUNTER — Ambulatory Visit: Payer: BLUE CROSS/BLUE SHIELD | Admitting: Family Medicine

## 2017-03-17 DIAGNOSIS — Z0289 Encounter for other administrative examinations: Secondary | ICD-10-CM

## 2017-03-18 ENCOUNTER — Telehealth: Payer: Self-pay | Admitting: Family Medicine

## 2017-03-18 NOTE — Telephone Encounter (Signed)
Please charge the no show fee  He has canceled quite a few appt in a row  - I will let him reach back out to [place] to schedule f/u

## 2017-03-18 NOTE — Telephone Encounter (Signed)
Patient did not come in for their appointment on 03/17/17 for follow up. Please let me know if patient needs to be contacted immediately for follow up or no follow up needed. Do you want to charge the NSF?

## 2017-03-31 DIAGNOSIS — L638 Other alopecia areata: Secondary | ICD-10-CM | POA: Diagnosis not present

## 2017-05-06 DIAGNOSIS — L659 Nonscarring hair loss, unspecified: Secondary | ICD-10-CM | POA: Diagnosis not present

## 2017-05-17 ENCOUNTER — Other Ambulatory Visit: Payer: Self-pay | Admitting: Family Medicine

## 2017-05-20 ENCOUNTER — Other Ambulatory Visit: Payer: Self-pay | Admitting: Family Medicine

## 2017-05-21 NOTE — Telephone Encounter (Signed)
Pt called about zolpidem refill; pt already has appt on 05/26/17 and pt voiced u nderstanding to talk about refill then.

## 2017-05-24 ENCOUNTER — Other Ambulatory Visit: Payer: Self-pay | Admitting: Family Medicine

## 2017-05-26 ENCOUNTER — Encounter: Payer: Self-pay | Admitting: Family Medicine

## 2017-05-26 ENCOUNTER — Ambulatory Visit (INDEPENDENT_AMBULATORY_CARE_PROVIDER_SITE_OTHER): Payer: BLUE CROSS/BLUE SHIELD | Admitting: Family Medicine

## 2017-05-26 VITALS — BP 122/68 | HR 95 | Temp 98.6°F | Ht 72.0 in | Wt 275.0 lb

## 2017-05-26 DIAGNOSIS — R05 Cough: Secondary | ICD-10-CM | POA: Diagnosis not present

## 2017-05-26 DIAGNOSIS — Z23 Encounter for immunization: Secondary | ICD-10-CM

## 2017-05-26 DIAGNOSIS — R058 Other specified cough: Secondary | ICD-10-CM

## 2017-05-26 MED ORDER — BENZONATATE 200 MG PO CAPS: 200.0000 mg | ORAL_CAPSULE | Freq: Three times a day (TID) | ORAL | 1 refills | Status: DC | PRN

## 2017-05-26 MED ORDER — ZOLPIDEM TARTRATE 10 MG PO TABS: 10.0000 mg | ORAL_TABLET | Freq: Every evening | ORAL | 5 refills | Status: DC | PRN

## 2017-05-26 MED ORDER — GUAIFENESIN-CODEINE 100-10 MG/5ML PO SYRP: 5.0000 mL | ORAL_SOLUTION | Freq: Every evening | ORAL | 0 refills | Status: DC | PRN

## 2017-05-26 NOTE — Patient Instructions (Addendum)
Take care of yourself and drink lots of fluids  I think you have a post viral cough syndrome and we need to stop the cough cycle   Take the tessalon three times daily  Take the guaifenesin with codeine at night - caution of sedation   I refilled ambien   Re schedule your regular follow up when you can   Update if not starting to improve in a week or if worsening

## 2017-05-26 NOTE — Progress Notes (Signed)
Subjective:    Patient ID: Sean Lee, male    DOB: [birth year], 56 y.o.   MRN: [id number]  HPI Here for an ongoing cough   A bad cough he cannot get rid of  Thinks it started with a cold                    ( ? If he got some germs from cpap)  The cold got better and cough persisted  Chest rattling  Productive of phlegm - yellow  No wheezing or sob  Chest is sore from coughing a bit  Clearing throat   Nose and ears are fine  No gerd symptoms   No cough medicines otc     Wt Readings from Last 3 Encounters:  05/26/17 275 lb (124.7 kg)  12/23/16 278 lb 6.4 oz (126.3 kg)  11/04/16 280 lb 6.4 oz (127.2 kg)   37.30 kg/m   Patient Active Problem List   Diagnosis Date Noted  . Leukocytosis 02/11/2017  . Controlled type 2 diabetes mellitus without complication, without long-term current use of insulin ([place]) 11/05/2016  . Alopecia 11/04/2016  . Back pain 05/06/2016  . Sleep disorder 08/28/2015  . Shoulder tendonitis 08/28/2015  . Colon cancer screening 08/28/2015  . Knee pain, right 03/28/2014  . Trochanteric bursitis 09/08/2013  . Hyperglycemia 01/02/2013  . Varicose veins 03/21/2012  . Rectus diastasis of lower abdomen 03/21/2012  . Obstructive sleep apnea 10/15/2009  . Obesity (BMI 30-39.9) 09/02/2009  . Hyperlipidemia 02/02/2007  . HEMATURIA, MICROSCOPIC 02/02/2007  . Essential hypertension 02/01/2007  . CEREBROVASCULAR ACCIDENT, HX OF 02/01/2007   Past Medical History:  Diagnosis Date  . Contact dermatitis and other eczema due to plants (except food)   . H/O: CVA (cardiovascular accident)    Carotid doppler neg (4/08)  . Hematuria   . Obesity, unspecified   . Other and unspecified hyperlipidemia    Hx - no medication/diet controll and weight loss  . Sacroiliitis, not elsewhere classified ([place])   . Sleep apnea    uses CPAP but not every night  . Unspecified essential hypertension    Past Surgical History:  Procedure Laterality Date  . Carotid  dopplers  4/08   Negative  . HERNIA REPAIR     umbilical   Social History  Substance Use Topics  . Smoking status: Never Smoker  . Smokeless tobacco: Never Used  . Alcohol use 2.4 oz/week    4 Cans of beer per week     Comment: weekends   Family History  Problem Relation Age of Onset  . Hypertension Mother   . Diabetes Mother   . Hyperlipidemia Unknown        in family   . Cancer Neg Hx   . Esophageal cancer Neg Hx   . Rectal cancer Neg Hx   . Stomach cancer Neg Hx   . Colon cancer Neg Hx    Allergies  Allergen Reactions  . Benazepril Hcl     REACTION: cough   Current Outpatient Prescriptions on File Prior to Visit  Medication Sig Dispense Refill  . aspirin 325 MG tablet Take 325 mg by mouth daily.      Marland Kitchen olmesartan-hydrochlorothiazide (BENICAR HCT) 40-25 MG tablet TABLET EVERY DAY 90 tablet 0  . zolpidem (AMBIEN) 10 MG tablet TAKE 1 TABLET BY MOUTH AT BEDTIME AS NEEDED FOR SLEEP 30 tablet 5   No current facility-administered medications on file prior to visit.     Review  of Systems  Constitutional: Negative for activity change, appetite change, fatigue, fever and unexpected weight change.  HENT: Negative for congestion, rhinorrhea, sore throat and trouble swallowing.   Eyes: Negative for pain, redness, itching and visual disturbance.  Respiratory: Positive for cough. Negative for apnea, chest tightness, shortness of breath, wheezing and stridor.   Cardiovascular: Negative for chest pain and palpitations.  Gastrointestinal: Negative for abdominal pain, blood in stool, constipation, diarrhea and nausea.  Endocrine: Negative for cold intolerance, heat intolerance, polydipsia and polyuria.  Genitourinary: Negative for difficulty urinating, dysuria, frequency and urgency.  Musculoskeletal: Negative for arthralgias, joint swelling and myalgias.  Skin: Negative for pallor and rash.  Neurological: Negative for dizziness, tremors, weakness, numbness and headaches.    Hematological: Negative for adenopathy. Does not bruise/bleed easily.  Psychiatric/Behavioral: Negative for decreased concentration and dysphoric mood. The patient is not nervous/anxious.        Objective:   Physical Exam  Constitutional: He appears well-developed and well-nourished. No distress.  obese and well appearing   HENT:  Head: Normocephalic and atraumatic.  Right Ear: External ear normal.  Left Ear: External ear normal.  Mouth/Throat: Oropharynx is clear and moist.  Boggy nares Scant post nasal drip   Eyes: Pupils are equal, round, and reactive to light. Conjunctivae and EOM are normal.  Neck: Normal range of motion. Neck supple. No JVD present. Carotid bruit is not present. No thyromegaly present.  Cardiovascular: Normal rate, regular rhythm, normal heart sounds and intact distal pulses.  Exam reveals no gallop.   Pulmonary/Chest: Effort normal and breath sounds normal. No respiratory distress. He has no wheezes. He has no rales. He exhibits no tenderness.  No crackles Good air exch  Harsh barky cough  No wheeze   Abdominal: Soft. Bowel sounds are normal. He exhibits no distension, no abdominal bruit and no mass. There is no tenderness.  Musculoskeletal: He exhibits no edema.  Lymphadenopathy:    He has no cervical adenopathy.  Neurological: He is alert. He has normal reflexes.  Skin: Skin is warm and dry. No rash noted.  Psychiatric: He has a normal mood and affect.          Assessment & Plan:   Problem List Items Addressed This Visit      Respiratory   Post-viral cough syndrome    Reassuring exam  Px tessalon tid  Also guif with codiene for bedtime  Goal to stop cough cycle Rest /hydration Disc symptomatic care - see instructions on AVS  Update if not starting to improve in a week or if worsening         Other Visit Diagnoses    Need for prophylactic vaccination and inoculation against influenza    -  Primary   Relevant Orders   Flu Vaccine QUAD  36+ mos IM (Completed)

## 2017-05-27 NOTE — Assessment & Plan Note (Signed)
Reassuring exam  Px tessalon tid  Also guif with codiene for bedtime  Goal to stop cough cycle Rest /hydration Disc symptomatic care - see instructions on AVS  Update if not starting to improve in a week or if worsening

## 2017-05-27 NOTE — Telephone Encounter (Signed)
Pt scheduled CPE, med refilled

## 2017-06-28 ENCOUNTER — Encounter: Payer: Self-pay | Admitting: Family Medicine

## 2017-06-28 ENCOUNTER — Telehealth: Payer: Self-pay | Admitting: Family Medicine

## 2017-06-28 DIAGNOSIS — Z125 Encounter for screening for malignant neoplasm of prostate: Secondary | ICD-10-CM

## 2017-06-28 DIAGNOSIS — E7800 Pure hypercholesterolemia, unspecified: Secondary | ICD-10-CM

## 2017-06-28 DIAGNOSIS — I10 Essential (primary) hypertension: Secondary | ICD-10-CM

## 2017-06-28 DIAGNOSIS — R739 Hyperglycemia, unspecified: Secondary | ICD-10-CM

## 2017-06-28 NOTE — Telephone Encounter (Signed)
-----   Message from Ellamae Sia sent at 06/28/2017  2:49 PM EST ----- Regarding: Lab orders for Wednesday, 11.21.18 Patient is scheduled for CPX labs, please order future labs, Thanks , Karna Christmas

## 2017-07-07 ENCOUNTER — Other Ambulatory Visit: Payer: BLUE CROSS/BLUE SHIELD

## 2017-07-14 ENCOUNTER — Encounter: Payer: BLUE CROSS/BLUE SHIELD | Admitting: Family Medicine

## 2017-07-21 ENCOUNTER — Other Ambulatory Visit: Payer: BLUE CROSS/BLUE SHIELD

## 2017-07-21 NOTE — Telephone Encounter (Signed)
error 

## 2017-07-22 ENCOUNTER — Other Ambulatory Visit (INDEPENDENT_AMBULATORY_CARE_PROVIDER_SITE_OTHER): Payer: BLUE CROSS/BLUE SHIELD

## 2017-07-22 DIAGNOSIS — R739 Hyperglycemia, unspecified: Secondary | ICD-10-CM | POA: Diagnosis not present

## 2017-07-22 DIAGNOSIS — I10 Essential (primary) hypertension: Secondary | ICD-10-CM

## 2017-07-22 DIAGNOSIS — E7800 Pure hypercholesterolemia, unspecified: Secondary | ICD-10-CM | POA: Diagnosis not present

## 2017-07-22 DIAGNOSIS — Z125 Encounter for screening for malignant neoplasm of prostate: Secondary | ICD-10-CM

## 2017-07-22 LAB — LIPID PANEL
CHOL/HDL RATIO: 3
Cholesterol: 158 mg/dL (ref 0–200)
HDL: 49.5 mg/dL (ref >39.00)
LDL Cholesterol: 85 mg/dL (ref 0–99)
NonHDL: 108.03
TRIGLYCERIDES: 115 mg/dL (ref 0.0–149.0)
VLDL: 23 mg/dL (ref 0.0–40.0)

## 2017-07-22 LAB — COMPREHENSIVE METABOLIC PANEL
ALT: 26 U/L (ref 0–53)
AST: 23 U/L (ref 0–37)
Albumin: 4.1 g/dL (ref 3.5–5.2)
Alkaline Phosphatase: 50 U/L (ref 39–117)
BUN: 28 mg/dL — ABNORMAL HIGH (ref 6–23)
CALCIUM: 9.4 mg/dL (ref 8.4–10.5)
CHLORIDE: 102 meq/L (ref 96–112)
CO2: 26 meq/L (ref 19–32)
Creatinine, Ser: 1.32 mg/dL (ref 0.40–1.50)
GFR: 71.99 mL/min (ref >60.00)
Glucose, Bld: 74 mg/dL (ref 70–99)
Potassium: 4.4 mEq/L (ref 3.5–5.1)
Sodium: 138 mEq/L (ref 135–145)
Total Bilirubin: 0.7 mg/dL (ref 0.2–1.2)
Total Protein: 7.1 g/dL (ref 6.0–8.3)

## 2017-07-22 LAB — CBC WITH DIFFERENTIAL/PLATELET
BASOS PCT: 0.4 % (ref 0.0–3.0)
Basophils Absolute: 0 10*3/uL (ref 0.0–0.1)
Eosinophils Absolute: 0.1 10*3/uL (ref 0.0–0.7)
Eosinophils Relative: 1.2 % (ref 0.0–5.0)
HEMATOCRIT: 42.1 % (ref 39.0–52.0)
Hemoglobin: 14.1 g/dL (ref 13.0–17.0)
LYMPHS PCT: 40 % (ref 12.0–46.0)
Lymphs Abs: 3.6 10*3/uL (ref 0.7–4.0)
MCHC: 33.6 g/dL (ref 30.0–36.0)
MCV: 97.6 fl (ref 78.0–100.0)
MONOS PCT: 10 % (ref 3.0–12.0)
Monocytes Absolute: 0.9 10*3/uL (ref 0.1–1.0)
NEUTROS ABS: 4.3 10*3/uL (ref 1.4–7.7)
Neutrophils Relative %: 48.4 % (ref 43.0–77.0)
PLATELETS: 315 10*3/uL (ref 150.0–400.0)
RBC: 4.31 Mil/uL (ref 4.22–5.81)
RDW: 12.9 % (ref 11.5–15.5)
WBC: 8.9 10*3/uL (ref 4.0–10.5)

## 2017-07-22 LAB — HEMOGLOBIN A1C: Hgb A1c MFr Bld: 6.2 % (ref 4.6–6.5)

## 2017-07-22 LAB — TSH: TSH: 0.43 u[IU]/mL (ref 0.35–4.50)

## 2017-07-22 LAB — PSA: PSA: 0.78 ng/mL (ref 0.10–4.00)

## 2017-08-04 ENCOUNTER — Telehealth: Payer: Self-pay

## 2017-08-04 ENCOUNTER — Encounter: Payer: BLUE CROSS/BLUE SHIELD | Admitting: Family Medicine

## 2017-08-04 NOTE — Telephone Encounter (Signed)
Yes, thank you.

## 2017-08-04 NOTE — Telephone Encounter (Signed)
Pt cancelled appt today for CPX and pt has rescheduled appt. Pt has had multiple cancellation and one no show since 02/2017. Do you want to charge late cancellation fee?

## 2017-08-04 NOTE — Telephone Encounter (Signed)
Copied from [place]. Topic: Quick Communication - Appointment Cancellation >> Aug 04, 2017  1:21 PM Pricilla Handler wrote: Patient called to cancel appointment scheduled for today. Patient has rescheduled their appointment.    Route to department's PEC pool.

## 2017-08-05 NOTE — Telephone Encounter (Signed)
Lynn notified

## 2017-09-01 ENCOUNTER — Encounter: Payer: BLUE CROSS/BLUE SHIELD | Admitting: Family Medicine

## 2017-09-17 ENCOUNTER — Ambulatory Visit: Payer: BLUE CROSS/BLUE SHIELD | Admitting: Family Medicine

## 2017-09-17 DIAGNOSIS — Z0289 Encounter for other administrative examinations: Secondary | ICD-10-CM

## 2017-10-06 ENCOUNTER — Encounter: Payer: BLUE CROSS/BLUE SHIELD | Admitting: Family Medicine

## 2017-10-18 ENCOUNTER — Encounter: Payer: BLUE CROSS/BLUE SHIELD | Admitting: Family Medicine

## 2017-10-25 ENCOUNTER — Encounter: Payer: BLUE CROSS/BLUE SHIELD | Admitting: Family Medicine

## 2017-10-27 ENCOUNTER — Telehealth: Payer: Self-pay | Admitting: Internal Medicine

## 2017-10-27 NOTE — Telephone Encounter (Signed)
Spoke with patient. He is aware of CY's recs. He stated that he was advised by [employer] that he qualifies for a new machine after July 2019. Patient stated that he will wait until then to get a new machine.   Nothing else needed at time of call.

## 2017-10-27 NOTE — Telephone Encounter (Signed)
Spoke with patient. He was requesting a new machine since his machine currently does not have a modem. He wants to be able to download the [employer] app and track his usage on his phone. He is tired of having to bring the machine to [place] to see his usage. [employer] took the modem out of his machine.   As far as the actual machine, he received it in 2017 and stated that was the only problem. Advised patient that I could print a download and have CY review it to make sure the machine was working well, he verbalized understanding.   CY, please advise if based on the patient's download, his machine is working well. I will place the download on your cart. Thanks!

## 2017-10-27 NOTE — Telephone Encounter (Signed)
Please let patient know that after 2 years the DME no longer monitors the patients on AV-therefore the patient should have a SD card for [place] to get information. If patient wants new machine he can pay out of pocket for one. Thanks.

## 2017-11-01 ENCOUNTER — Ambulatory Visit (INDEPENDENT_AMBULATORY_CARE_PROVIDER_SITE_OTHER): Payer: BLUE CROSS/BLUE SHIELD | Admitting: Family Medicine

## 2017-11-01 ENCOUNTER — Encounter: Payer: Self-pay | Admitting: Family Medicine

## 2017-11-01 VITALS — BP 124/72 | HR 85 | Temp 98.4°F | Ht 71.0 in | Wt 272.2 lb

## 2017-11-01 DIAGNOSIS — R739 Hyperglycemia, unspecified: Secondary | ICD-10-CM

## 2017-11-01 DIAGNOSIS — Z0000 Encounter for general adult medical examination without abnormal findings: Secondary | ICD-10-CM | POA: Diagnosis not present

## 2017-11-01 DIAGNOSIS — G479 Sleep disorder, unspecified: Secondary | ICD-10-CM

## 2017-11-01 DIAGNOSIS — Z125 Encounter for screening for malignant neoplasm of prostate: Secondary | ICD-10-CM

## 2017-11-01 DIAGNOSIS — E7800 Pure hypercholesterolemia, unspecified: Secondary | ICD-10-CM | POA: Diagnosis not present

## 2017-11-01 DIAGNOSIS — I10 Essential (primary) hypertension: Secondary | ICD-10-CM

## 2017-11-01 DIAGNOSIS — E669 Obesity, unspecified: Secondary | ICD-10-CM

## 2017-11-01 MED ORDER — BENZONATATE 200 MG PO CAPS: 200.0000 mg | ORAL_CAPSULE | Freq: Three times a day (TID) | ORAL | 1 refills | Status: DC | PRN

## 2017-11-01 MED ORDER — OLMESARTAN MEDOXOMIL-HCTZ 40-25 MG PO TABS: 1.0000 | ORAL_TABLET | Freq: Every day | ORAL | 3 refills | Status: DC

## 2017-11-01 NOTE — Patient Instructions (Addendum)
If you are interested in the new shingles vaccine (Shingrix) - call your local pharmacy to check on coverage and availability  I advise getting on the waiting list     if you use ambien to sleep- make sure you don't eat for 4 hours before bed time  Keep using the Calm app for sleep   If you are having knee pain - make sure you continue to ride your bike  This helps strengthen the surrounding structures for your knee Wear the orthotics /shoe inserts also to help your knees  Keep working on weight loss   Take care of yourself

## 2017-11-01 NOTE — Assessment & Plan Note (Signed)
bp in fair control at this time  BP Readings from Last 1 Encounters:  11/01/17 124/72   No changes needed Disc lifstyle change with low sodium diet and exercise  Labs rev  Enc further wt loss

## 2017-11-01 NOTE — Assessment & Plan Note (Signed)
No symptoms  Lab Results  Component Value Date   PSA 0.78 07/22/2017   PSA 1.2 04/09/2004    Continue to follow

## 2017-11-01 NOTE — Assessment & Plan Note (Signed)
Lab Results  Component Value Date   HGBA1C 6.2 07/22/2017   disc imp of low glycemic diet and wt loss to prevent DM2

## 2017-11-01 NOTE — Assessment & Plan Note (Signed)
Disc goals for lipids and reasons to control them Rev labs with pt Rev low sat fat diet in detail LDL of 85

## 2017-11-01 NOTE — Assessment & Plan Note (Signed)
Reviewed health habits including diet and exercise and skin cancer prevention Reviewed appropriate screening tests for age  Also reviewed health mt list, fam hx and immunization status , as well as social and family history   See HPI Disc interest in shingrix vaccine Rev labs Enc further wt loss  Bike and orthotics for knee pain  Continue meditation app for sleep

## 2017-11-01 NOTE — Assessment & Plan Note (Signed)
Continue ambien as needed (disc taking on empty stomach)  Sleep hygeine Meditation/ Calm app

## 2017-11-01 NOTE — Progress Notes (Signed)
Subjective:    Patient ID: Sean Lee, male    DOB: 08-10-1961, 57 y.o.   MRN: [id number]  HPI Here for health maintenance exam and to review chronic medical problems    Just came back from a cruise  Has a cold  Bad cough- phlegm (hurts to cough) No fever  No otc medicines - he is using left over tessalon   Other than that he is doing well   Not sleeping as well  Has the calm app     Wt Readings from Last 3 Encounters:  11/01/17 272 lb 4 oz (123.5 kg)  05/26/17 275 lb (124.7 kg)  12/23/16 278 lb 6.4 oz (126.3 kg)  he has been very careful with his diet -trying to loose weight  37.97 kg/m   Hard to exercise due to knee pain - worse after inactivity and then improves when he gets going   Uses bike and walks when he can    Last eye exam -just made an appt with Dr Satira Sark  Needs his readers fixed   Colonoscopy 3/17 -hyperplastic polyps - 5 year recall   Tetanus shot 3/17  Flu shot 10/18  Zoster status-will check out shingrix   Prostate screen Nocturia -none  No urgency or frequency  P uncle with prostate cancer in 16s ? (not sure)  Lab Results  Component Value Date   PSA 0.78 07/22/2017   PSA 1.2 04/09/2004     bp is stable today  Remote hx of CVA No cp or palpitations or headaches or edema  No side effects to medicines  BP Readings from Last 3 Encounters:  11/01/17 124/72  05/26/17 122/68  12/23/16 132/74        Hyperlipidemia Lab Results  Component Value Date   CHOL 158 07/22/2017   CHOL 134 11/04/2016   CHOL 145 08/28/2015   Lab Results  Component Value Date   HDL 49.50 07/22/2017   HDL 44.40 11/04/2016   HDL 52.50 08/28/2015   Lab Results  Component Value Date   LDLCALC 85 07/22/2017   LDLCALC 70 11/04/2016   LDLCALC 74 08/28/2015   Lab Results  Component Value Date   TRIG 115.0 07/22/2017   TRIG 97.0 11/04/2016   TRIG 92.0 08/28/2015   Lab Results  Component Value Date   CHOLHDL 3 07/22/2017   CHOLHDL 3 11/04/2016   CHOLHDL 3 08/28/2015   No results found for: LDLDIRECT Very well controlled with diet   Lab Results  Component Value Date   CREATININE 1.32 07/22/2017   BUN 28 (H) 07/22/2017   NA 138 07/22/2017   K 4.4 07/22/2017   CL 102 07/22/2017   CO2 26 07/22/2017   Lab Results  Component Value Date   ALT 26 07/22/2017   AST 23 07/22/2017   ALKPHOS 50 07/22/2017   BILITOT 0.7 07/22/2017   last glucose 74   Patient Active Problem List   Diagnosis Date Noted  . Routine general medical examination at a health care facility 11/01/2017  . Prostate cancer screening 06/28/2017  . Alopecia 11/04/2016  . Back pain 05/06/2016  . Sleep disorder 08/28/2015  . Colon cancer screening 08/28/2015  . Hyperglycemia 01/02/2013  . Varicose veins 03/21/2012  . Rectus diastasis of lower abdomen 03/21/2012  . Obstructive sleep apnea 10/15/2009  . Obesity (BMI 30-39.9) 09/02/2009  . Hyperlipidemia 02/02/2007  . HEMATURIA, MICROSCOPIC 02/02/2007  . Essential hypertension 02/01/2007  . CEREBROVASCULAR ACCIDENT, HX OF 02/01/2007   Past Medical History:  Diagnosis Date  . Contact dermatitis and other eczema due to plants (except food)   . H/O: CVA (cardiovascular accident)    Carotid doppler neg (4/08)  . Hematuria   . Obesity, unspecified   . Other and unspecified hyperlipidemia    Hx - no medication/diet controll and weight loss  . Sacroiliitis, not elsewhere classified ([place])   . Sleep apnea    uses CPAP but not every night  . Unspecified essential hypertension    Past Surgical History:  Procedure Laterality Date  . Carotid dopplers  4/08   Negative  . HERNIA REPAIR     umbilical   Social History   Tobacco Use  . Smoking status: Never Smoker  . Smokeless tobacco: Never Used  Substance Use Topics  . Alcohol use: Yes    Alcohol/week: 2.4 oz    Types: 4 Cans of beer per week    Comment: weekends  . Drug use: No   Family History  Problem Relation Age of Onset  . Hypertension Mother    . Diabetes Mother   . Hyperlipidemia Unknown        in family   . Cancer Neg Hx   . Esophageal cancer Neg Hx   . Rectal cancer Neg Hx   . Stomach cancer Neg Hx   . Colon cancer Neg Hx    Allergies  Allergen Reactions  . Benazepril Hcl     REACTION: cough   Current Outpatient Medications on File Prior to Visit  Medication Sig Dispense Refill  . aspirin 325 MG tablet Take 325 mg by mouth daily.      Marland Kitchen zolpidem (AMBIEN) 10 MG tablet Take 1 tablet (10 mg total) by mouth at bedtime as needed. for sleep 30 tablet 5   No current facility-administered medications on file prior to visit.     Review of Systems  Constitutional: Negative for activity change, appetite change, fatigue, fever and unexpected weight change.  HENT: Negative for congestion, rhinorrhea, sore throat and trouble swallowing.   Eyes: Negative for pain, redness, itching and visual disturbance.  Respiratory: Positive for cough. Negative for chest tightness, shortness of breath and wheezing.   Cardiovascular: Negative for chest pain and palpitations.  Gastrointestinal: Negative for abdominal pain, blood in stool, constipation, diarrhea and nausea.  Endocrine: Negative for cold intolerance, heat intolerance, polydipsia and polyuria.  Genitourinary: Negative for difficulty urinating, dysuria, frequency and urgency.  Musculoskeletal: Positive for arthralgias. Negative for joint swelling and myalgias.       Knee pain   Skin: Negative for pallor and rash.  Neurological: Negative for dizziness, tremors, weakness, numbness and headaches.  Hematological: Negative for adenopathy. Does not bruise/bleed easily.  Psychiatric/Behavioral: Positive for sleep disturbance. Negative for decreased concentration and dysphoric mood. The patient is not nervous/anxious.        Objective:   Physical Exam  Constitutional: He appears well-developed and well-nourished. No distress.  obese and well appearing   HENT:  Head: Normocephalic and  atraumatic.  Right Ear: External ear normal.  Left Ear: External ear normal.  Nose: Nose normal.  Mouth/Throat: Oropharynx is clear and moist.  Eyes: Conjunctivae and EOM are normal. Pupils are equal, round, and reactive to light. Right eye exhibits no discharge. Left eye exhibits no discharge. No scleral icterus.  Neck: Normal range of motion. Neck supple. No JVD present. Carotid bruit is not present. No thyromegaly present.  Cardiovascular: Normal rate, regular rhythm, normal heart sounds and intact distal pulses. Exam reveals no gallop.  Pulmonary/Chest: Effort normal and breath sounds normal. No respiratory distress. He has no wheezes. He exhibits no tenderness.  Abdominal: Soft. Bowel sounds are normal. He exhibits no distension, no abdominal bruit and no mass. There is no tenderness.  Rectus diastasis -baseline  Musculoskeletal: He exhibits no edema or tenderness.  Pes planus    Lymphadenopathy:    He has no cervical adenopathy.  Neurological: He is alert. He has normal reflexes. No cranial nerve deficit. He exhibits normal muscle tone. Coordination normal.  Skin: Skin is warm and dry. No rash noted. No erythema. No pallor.  Thickened toe nails   Psychiatric: He has a normal mood and affect.  Mood is good          Assessment & Plan:   Problem List Items Addressed This Visit      Cardiovascular and Mediastinum   Essential hypertension    bp in fair control at this time  BP Readings from Last 1 Encounters:  11/01/17 124/72   No changes needed Disc lifstyle change with low sodium diet and exercise  Labs rev  Enc further wt loss       Relevant Medications   olmesartan-hydrochlorothiazide (BENICAR HCT) 40-25 MG tablet     Other   Hyperglycemia    Lab Results  Component Value Date   HGBA1C 6.2 07/22/2017   disc imp of low glycemic diet and wt loss to prevent DM2       Hyperlipidemia    Disc goals for lipids and reasons to control them Rev labs with pt Rev low  sat fat diet in detail LDL of 85        Relevant Medications   olmesartan-hydrochlorothiazide (BENICAR HCT) 40-25 MG tablet   Obesity (BMI 30-39.9)    Discussed how this problem influences overall health and the risks it imposes  Reviewed plan for weight loss with lower calorie diet (via better food choices and also portion control or program like weight watchers) and exercise building up to or more than 30 minutes 5 days per week including some aerobic activity         Prostate cancer screening    No symptoms  Lab Results  Component Value Date   PSA 0.78 07/22/2017   PSA 1.2 04/09/2004    Continue to follow      Routine general medical examination at a health care facility - Primary    Reviewed health habits including diet and exercise and skin cancer prevention Reviewed appropriate screening tests for age  Also reviewed health mt list, fam hx and immunization status , as well as social and family history   See HPI Disc interest in shingrix vaccine Rev labs Enc further wt loss  Bike and orthotics for knee pain  Continue meditation app for sleep      Sleep disorder    Continue ambien as needed (disc taking on empty stomach)  Sleep hygeine Meditation/ Calm app

## 2017-11-01 NOTE — Assessment & Plan Note (Signed)
Discussed how this problem influences overall health and the risks it imposes  Reviewed plan for weight loss with lower calorie diet (via better food choices and also portion control or program like weight watchers) and exercise building up to or more than 30 minutes 5 days per week including some aerobic activity    

## 2017-12-15 DIAGNOSIS — H524 Presbyopia: Secondary | ICD-10-CM | POA: Diagnosis not present

## 2017-12-15 DIAGNOSIS — H2513 Age-related nuclear cataract, bilateral: Secondary | ICD-10-CM | POA: Diagnosis not present

## 2017-12-15 DIAGNOSIS — H52203 Unspecified astigmatism, bilateral: Secondary | ICD-10-CM | POA: Diagnosis not present

## 2017-12-15 DIAGNOSIS — H401131 Primary open-angle glaucoma, bilateral, mild stage: Secondary | ICD-10-CM | POA: Diagnosis not present

## 2018-01-24 DIAGNOSIS — L658 Other specified nonscarring hair loss: Secondary | ICD-10-CM | POA: Diagnosis not present

## 2018-04-05 DIAGNOSIS — G4733 Obstructive sleep apnea (adult) (pediatric): Secondary | ICD-10-CM | POA: Diagnosis not present

## 2018-04-07 DIAGNOSIS — L638 Other alopecia areata: Secondary | ICD-10-CM | POA: Diagnosis not present

## 2018-04-27 ENCOUNTER — Ambulatory Visit: Payer: BLUE CROSS/BLUE SHIELD | Admitting: Primary Care

## 2018-04-29 ENCOUNTER — Telehealth: Payer: Self-pay | Admitting: Family Medicine

## 2018-04-29 MED ORDER — OLMESARTAN MEDOXOMIL-HCTZ 40-25 MG PO TABS: 1.0000 | ORAL_TABLET | Freq: Every day | ORAL | 1 refills | Status: DC

## 2018-04-29 NOTE — Telephone Encounter (Signed)
Copied from [place] [phone]. Topic: Quick Communication - Rx Refill/Question >> Apr 29, 2018  5:44 PM Neva Seat wrote: olmesartan-hydrochlorothiazide [facility] HCT) 40-25 MG tablet  Needing refills  [id number] [address], [place] [phone] (Phone) [phone] (Fax)

## 2018-05-02 ENCOUNTER — Ambulatory Visit: Payer: BLUE CROSS/BLUE SHIELD | Admitting: Primary Care

## 2018-05-02 NOTE — Progress Notes (Deleted)
 @  Patient ID: Sean Lee, male    DOB: 1960/09/09, 57 y.o.   MRN: [id number]  No chief complaint on file.   Referring provider: Tower, Wynelle Fanny, MD  HPI: 57 year old male, never smoked. PMH OSA. Patient Dr. Annamaria Boots, last seen May 2018. CPAP setting 5-20cm H20.    05/02/2018 Patient presents today for CPAP review. Qualifies for new machine. Current one does not have modem for download, patient would like to use airview app to track use on his phone.    Allergies  Allergen Reactions  . Benazepril Hcl     REACTION: cough    Immunization History  Administered Date(s) Administered  . Influenza,inj,Quad PF,6+ Mos 05/08/2013, 06/06/2015, 05/26/2017  . Pneumococcal Polysaccharide-23 05/08/2013  . Td 02/02/2007  . Tdap 11/04/2016    Past Medical History:  Diagnosis Date  . Contact dermatitis and other eczema due to plants (except food)   . H/O: CVA (cardiovascular accident)    Carotid doppler neg (4/08)  . Hematuria   . Obesity, unspecified   . Other and unspecified hyperlipidemia    Hx - no medication/diet controll and weight loss  . Sacroiliitis, not elsewhere classified ([place])   . Sleep apnea    uses CPAP but not every night  . Unspecified essential hypertension     Tobacco History: Social History   Tobacco Use  Smoking Status Never Smoker  Smokeless Tobacco Never Used   Counseling given: Not Answered   Outpatient Medications Prior to Visit  Medication Sig Dispense Refill  . aspirin 325 MG tablet Take 325 mg by mouth daily.      . benzonatate (TESSALON) 200 MG capsule Take 1 capsule (200 mg total) by mouth 3 (three) times daily as needed. Swallow whole, do not bite pill 30 capsule 1  . olmesartan-hydrochlorothiazide (BENICAR HCT) 40-25 MG tablet Take 1 tablet by mouth daily. 90 tablet 1  . zolpidem (AMBIEN) 10 MG tablet Take 1 tablet (10 mg total) by mouth at bedtime as needed. for sleep 30 tablet 5   No facility-administered medications prior to visit.        Review of Systems  Review of Systems   Physical Exam  There were no vitals taken for this visit. Physical Exam   Lab Results:  CBC    Component Value Date/Time   WBC 8.9 07/22/2017 0854   RBC 4.31 07/22/2017 0854   HGB 14.1 07/22/2017 0854   HCT 42.1 07/22/2017 0854   PLT 315.0 07/22/2017 0854   MCV 97.6 07/22/2017 0854   MCHC 33.6 07/22/2017 0854   RDW 12.9 07/22/2017 0854   LYMPHSABS 3.6 07/22/2017 0854   MONOABS 0.9 07/22/2017 0854   EOSABS 0.1 07/22/2017 0854   BASOSABS 0.0 07/22/2017 0854    BMET    Component Value Date/Time   NA 138 07/22/2017 0854   K 4.4 07/22/2017 0854   CL 102 07/22/2017 0854   CO2 26 07/22/2017 0854   GLUCOSE 74 07/22/2017 0854   BUN 28 (H) 07/22/2017 0854   CREATININE 1.32 07/22/2017 0854   CALCIUM 9.4 07/22/2017 0854   GFRNONAA 88.68 01/02/2010 0947   GFRAA 83 08/24/2008 1104    BNP No results found for: BNP  ProBNP No results found for: PROBNP  Imaging: No results found.   Assessment & Plan:   No problem-specific Assessment & Plan notes found for this encounter.     Martyn Ehrich, NP 05/02/2018

## 2018-05-04 ENCOUNTER — Ambulatory Visit: Payer: BLUE CROSS/BLUE SHIELD | Admitting: Primary Care

## 2018-05-09 ENCOUNTER — Ambulatory Visit (INDEPENDENT_AMBULATORY_CARE_PROVIDER_SITE_OTHER): Payer: BLUE CROSS/BLUE SHIELD | Admitting: Primary Care

## 2018-05-09 ENCOUNTER — Encounter: Payer: Self-pay | Admitting: Primary Care

## 2018-05-09 VITALS — BP 128/70 | HR 79 | Ht 71.0 in | Wt 280.4 lb

## 2018-05-09 DIAGNOSIS — G4733 Obstructive sleep apnea (adult) (pediatric): Secondary | ICD-10-CM | POA: Diagnosis not present

## 2018-05-09 NOTE — Assessment & Plan Note (Addendum)
-   HST in 2014 showed mild OSA, AHI 9.7 - Using CPAP auto titrate 5-20cm H20 - 73% compliance >4hrs  - Some issues with mask fit and air leaking - Reports benefit from use - I think a new machine with ability to track usage on his mobile phone will help increase compliance - Referral to DME company for new machine with mask and supplies  - Goal 100% compliance, 4-6 hr + every night - Review download in 2-3 weeks  - FU annually with Dr. Annamaria Boots or NP

## 2018-05-09 NOTE — Patient Instructions (Signed)
Referral to DME company for new CPAP machine Setting 5-20cm H20, humidification, mask of choice and supplies  Make sure to wear CPAP every night, goal use 4-6hrs + Do not drive if experiencing excessive daytime fatigue or somnolence   FU in 1 year with Dr. Annamaria Boots for CPAP OV

## 2018-05-09 NOTE — Progress Notes (Signed)
@Patient  ID: Sean Lee, male    DOB: 1960/12/06, 56 y.o.   MRN: [id number]  Chief Complaint  Patient presents with  . Follow-up    New CPAP    Referring provider: Tower, Wynelle Fanny, MD  HPI: 57 year old male, never smoked. PMH OSA. HST in 2014 showed mild obstructive sleep apnea, AHI 9.7/hr. Patient Dr. Annamaria Boots, last seen May 2018. CPAP setting 5-20cm H20.   05/09/2018 Patient presents today for CPAP review. Qualifies for new machine after July 2019. Current one does not have modem for download, patient would like to use airview app to track use on his phone. Has Resmed S-9. DME company is [employer]. He has fair-good compliance, states that he has been having some issues with mask fit and air leaking. Doesn't want it tight on his face. 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 Feels a lot better since using, reports more energy. No reports of falling asleep while driving, during conversation or while eating.   Airview was able to be accessed today - Usage 74/90 days (82% compliance) - 66 days >4hours (73% compliance) - Pressure set at 5-20cm H20  (95th percentile 10.3) - Minimal air leaks  - AHI 0.3    Allergies  Allergen Reactions  . Benazepril Hcl     REACTION: cough    Immunization History  Administered Date(s) Administered  . Influenza,inj,Quad PF,6+ Mos 05/08/2013, 06/06/2015, 05/26/2017  . Pneumococcal Polysaccharide-23 05/08/2013  . Td 02/02/2007  . Tdap 11/04/2016    Past Medical History:  Diagnosis Date  . Contact dermatitis and other eczema due to plants (except food)   . H/O: CVA (cardiovascular accident)    Carotid doppler neg (4/08)  . Hematuria   . Obesity, unspecified   . Other and unspecified hyperlipidemia    Hx - no medication/diet controll and weight loss  . Sacroiliitis, not elsewhere classified ([place])   . Sleep apnea    uses CPAP but not every night  . Unspecified essential hypertension     Tobacco  History: Social History   Tobacco Use  Smoking Status Never Smoker  Smokeless Tobacco Never Used   Counseling given: Not Answered   Outpatient Medications Prior to Visit  Medication Sig Dispense Refill  . aspirin 325 MG tablet Take 325 mg by mouth daily.      . benzonatate (TESSALON) 200 MG capsule Take 1 capsule (200 mg total) by mouth 3 (three) times daily as needed. Swallow whole, do not bite pill 30 capsule 1  . olmesartan-hydrochlorothiazide (BENICAR HCT) 40-25 MG tablet Take 1 tablet by mouth daily. 90 tablet 1  . zolpidem (AMBIEN) 10 MG tablet Take 1 tablet (10 mg total) by mouth at bedtime as needed. for sleep 30 tablet 5   No facility-administered medications prior to visit.     Review of Systems  Review of Systems  Constitutional: Negative.   HENT: Negative.   Respiratory: Negative.   Cardiovascular: Negative.   Psychiatric/Behavioral: Negative.     Physical Exam  BP 128/70 (BP Location: Left Arm, Cuff Size: Normal)   Pulse 79   Ht 5\' 11"  (1.803 m)   Wt 280 lb 6.4 oz (127.2 kg)   SpO2 99%   BMI 39.11 kg/m  Physical Exam  Constitutional: He is oriented to person, place, and time. He appears well-developed and well-nourished.  HENT:  Head: Normocephalic and atraumatic.  Eyes: Pupils are equal, round, and reactive to light. EOM are normal.  Neck: Normal range  of motion.  Cardiovascular: Normal rate and regular rhythm.  Pulmonary/Chest: Effort normal and breath sounds normal.  Musculoskeletal: Normal range of motion.  Neurological: He is oriented to person, place, and time.  Skin: Skin is warm and dry.  Psychiatric: He has a normal mood and affect. His behavior is normal. Judgment and thought content normal.     Lab Results:  CBC    Component Value Date/Time   WBC 8.9 07/22/2017 0854   RBC 4.31 07/22/2017 0854   HGB 14.1 07/22/2017 0854   HCT 42.1 07/22/2017 0854   PLT 315.0 07/22/2017 0854   MCV 97.6 07/22/2017 0854   MCHC 33.6 07/22/2017 0854    RDW 12.9 07/22/2017 0854   LYMPHSABS 3.6 07/22/2017 0854   MONOABS 0.9 07/22/2017 0854   EOSABS 0.1 07/22/2017 0854   BASOSABS 0.0 07/22/2017 0854    BMET    Component Value Date/Time   NA 138 07/22/2017 0854   K 4.4 07/22/2017 0854   CL 102 07/22/2017 0854   CO2 26 07/22/2017 0854   GLUCOSE 74 07/22/2017 0854   BUN 28 (H) 07/22/2017 0854   CREATININE 1.32 07/22/2017 0854   CALCIUM 9.4 07/22/2017 0854   GFRNONAA 88.68 01/02/2010 0947   GFRAA 83 08/24/2008 1104    BNP No results found for: BNP  ProBNP No results found for: PROBNP  Imaging: No results found.   Assessment & Plan:   Obstructive sleep apnea - HST in 2014 showed mild OSA, AHI 9.7 - Using CPAP auto titrate 5-20cm H20 - 73% compliance >4hrs  - Some issues with mask fit and air leaking - Reports benefit from use - I think a new machine with ability to track usage on his mobile phone will help increase compliance - Referral to DME company for new machine with mask and supplies  - Goal 100% compliance, 4-6 hr + every night - Review download in 2-3 weeks  - FU annually with Dr. Annamaria Boots or NP      Martyn Ehrich, NP 05/09/2018

## 2018-05-11 DIAGNOSIS — L638 Other alopecia areata: Secondary | ICD-10-CM | POA: Diagnosis not present

## 2018-05-30 DIAGNOSIS — G4733 Obstructive sleep apnea (adult) (pediatric): Secondary | ICD-10-CM | POA: Diagnosis not present

## 2018-06-15 DIAGNOSIS — L638 Other alopecia areata: Secondary | ICD-10-CM | POA: Diagnosis not present

## 2018-06-30 DIAGNOSIS — G4733 Obstructive sleep apnea (adult) (pediatric): Secondary | ICD-10-CM | POA: Diagnosis not present

## 2018-07-06 ENCOUNTER — Telehealth: Payer: Self-pay | Admitting: Internal Medicine

## 2018-07-08 NOTE — Telephone Encounter (Signed)
Will close encounter as it was opened in error.

## 2018-07-11 ENCOUNTER — Ambulatory Visit: Payer: BLUE CROSS/BLUE SHIELD | Admitting: Internal Medicine

## 2018-07-20 DIAGNOSIS — L638 Other alopecia areata: Secondary | ICD-10-CM | POA: Diagnosis not present

## 2018-07-30 DIAGNOSIS — G4733 Obstructive sleep apnea (adult) (pediatric): Secondary | ICD-10-CM | POA: Diagnosis not present

## 2018-08-11 ENCOUNTER — Ambulatory Visit: Payer: BLUE CROSS/BLUE SHIELD | Admitting: Family Medicine

## 2018-08-22 ENCOUNTER — Ambulatory Visit: Payer: BLUE CROSS/BLUE SHIELD | Admitting: Family Medicine

## 2018-08-24 ENCOUNTER — Ambulatory Visit: Payer: BLUE CROSS/BLUE SHIELD | Admitting: Family Medicine

## 2018-08-30 DIAGNOSIS — G4733 Obstructive sleep apnea (adult) (pediatric): Secondary | ICD-10-CM | POA: Diagnosis not present

## 2018-08-31 ENCOUNTER — Ambulatory Visit (INDEPENDENT_AMBULATORY_CARE_PROVIDER_SITE_OTHER)
Admission: RE | Admit: 2018-08-31 | Discharge: 2018-08-31 | Disposition: A | Discharge status: Home / Self Care | Payer: BLUE CROSS/BLUE SHIELD | Source: Ambulatory Visit | Attending: Primary Care | Admitting: Primary Care

## 2018-08-31 ENCOUNTER — Encounter: Payer: Self-pay | Admitting: Primary Care

## 2018-08-31 ENCOUNTER — Ambulatory Visit (INDEPENDENT_AMBULATORY_CARE_PROVIDER_SITE_OTHER): Payer: BLUE CROSS/BLUE SHIELD | Admitting: Primary Care

## 2018-08-31 ENCOUNTER — Telehealth: Payer: Self-pay

## 2018-08-31 ENCOUNTER — Ambulatory Visit: Payer: BLUE CROSS/BLUE SHIELD | Admitting: Family Medicine

## 2018-08-31 VITALS — BP 122/68 | HR 86 | Temp 98.5°F | Wt 278.5 lb

## 2018-08-31 DIAGNOSIS — M2540 Effusion, unspecified joint: Secondary | ICD-10-CM

## 2018-08-31 DIAGNOSIS — M7989 Other specified soft tissue disorders: Secondary | ICD-10-CM | POA: Diagnosis not present

## 2018-08-31 DIAGNOSIS — G8929 Other chronic pain: Secondary | ICD-10-CM | POA: Insufficient documentation

## 2018-08-31 DIAGNOSIS — M25571 Pain in right ankle and joints of right foot: Secondary | ICD-10-CM | POA: Diagnosis not present

## 2018-08-31 NOTE — Assessment & Plan Note (Signed)
Also with chronic swelling. Exam today with obvious mass/swelling that is concerning.  No obvious signs for infection. Differentials include gout, mass, rheumatoid arthritis/osteoarthritis. Check labs today including rheumatoid work-up, CBC, uric acid. Plain films of right ankle pending.  Given that this has been chronic for 2 years and he appears stable, will await labs and x-ray for treatment.

## 2018-08-31 NOTE — Progress Notes (Signed)
Subjective:    Patient ID: Sean Lee, male    DOB: 10-Apr-1961, 58 y.o.   MRN: [id number]  HPI  Sean Lee is a 58 year old male who presents today with a chief complaint of ankle pain.   He also reports swelling. His symptoms are located to the right medial malleolus which has been present intermittently for the last several years. Several days ago he attempted to get out of the bed and noticed right lower extremity stiffness and pain, like a muscle contraction.   His pain has been intermittent over the years. Swelling has been daily. Over the last several months his pain has been consistent. He wears steel toed shoes for work. 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 He's not taken anything OTC for symptoms.  He has noted that his gait is slightly altered because of this, also cannot fit into dress shoes.  He denies injury, trauma, pain to the calf, swelling to the calf, fevers, redness. He's never undergone work up for this in the past.   BP Readings from Last 3 Encounters:  08/31/18 122/68  05/09/18 128/70  11/01/17 124/72     Review of Systems  Constitutional: Negative for fever.  Respiratory: Negative for shortness of breath.   Cardiovascular: Negative for chest pain.  Musculoskeletal: Positive for arthralgias and joint swelling.  Skin: Negative for color change.  Neurological: Negative for weakness.       Past Medical History:  Diagnosis Date  . Contact dermatitis and other eczema due to plants (except food)   . H/O: CVA (cardiovascular accident)    Carotid doppler neg (4/08)  . Hematuria   . Obesity, unspecified   . Other and unspecified hyperlipidemia    Hx - no medication/diet controll and weight loss  . Sacroiliitis, not elsewhere classified ([place])   . Sleep apnea    uses CPAP but not every night  . Unspecified essential hypertension      Social History   Socioeconomic History  . Marital status:  Married    Spouse name: Not on file  . Number of children: 2  . Years of education: Not on file  . Highest education level: Not on file  Occupational History  . Occupation: Truck Education administrator: [employer]  . Financial resource strain: Not on file  . Food insecurity:    Worry: Not on file    Inability: Not on file  . Transportation needs:    Medical: Not on file    Non-medical: Not on file  Tobacco Use  . Smoking status: Never Smoker  . Smokeless tobacco: Never Used  Substance and Sexual Activity  . Alcohol use: Yes    Alcohol/week: 4.0 standard drinks    Types: 4 Cans of beer per week    Comment: weekends  . Drug use: No  . Sexual activity: Not on file  Lifestyle  . Physical activity:    Days per week: Not on file    Minutes per session: Not on file  . Stress: Not on file  Relationships  . Social connections:    Talks on phone: Not on file    Gets together: Not on file    Attends religious service: Not on file    Active member of club or organization: Not on file    Attends meetings of clubs or organizations: Not on file  Relationship status: Not on file  . Intimate partner violence:    Fear of current or ex partner: Not on file    Emotionally abused: Not on file    Physically abused: Not on file    Forced sexual activity: Not on file  Other Topics Concern  . Not on file  Social History Narrative  . Not on file    Past Surgical History:  Procedure Laterality Date  . Carotid dopplers  4/08   Negative  . HERNIA REPAIR     umbilical    Family History  Problem Relation Age of Onset  . Hypertension Mother   . Diabetes Mother   . Hyperlipidemia Unknown        in family   . Cancer Neg Hx   . Esophageal cancer Neg Hx   . Rectal cancer Neg Hx   . Stomach cancer Neg Hx   . Colon cancer Neg Hx     Allergies  Allergen Reactions  . Benazepril Hcl     REACTION: cough    Current Outpatient Medications on File Prior to Visit  Medication  Sig Dispense Refill  . aspirin 325 MG tablet Take 325 mg by mouth daily.      Marland Kitchen latanoprost (XALATAN) 0.005 % ophthalmic solution Place 1 drop into both eyes at bedtime.    Marland Kitchen olmesartan-hydrochlorothiazide (BENICAR HCT) 40-25 MG tablet Take 1 tablet by mouth daily. 90 tablet 1  . zolpidem (AMBIEN) 10 MG tablet Take 1 tablet (10 mg total) by mouth at bedtime as needed. for sleep 30 tablet 5   No current facility-administered medications on file prior to visit.     BP 122/68 (BP Location: Right Arm, Patient Position: Sitting, Cuff Size: Large)   Pulse 86   Temp 98.5 F (36.9 C) (Oral)   Wt 278 lb 8 oz (126.3 kg)   SpO2 95%   BMI 38.84 kg/m    Objective:   Physical Exam  Constitutional: He appears well-nourished.  Cardiovascular: Normal rate.  Pulses:      Dorsalis pedis pulses are 2+ on the right side.       Posterior tibial pulses are 2+ on the right side.  Respiratory: Effort normal.  Musculoskeletal:     Right ankle: He exhibits decreased range of motion and swelling. He exhibits normal pulse. Tenderness. Medial malleolus tenderness found.       Feet:     Comments: Moderate swelling/mass to right medial malleolus without erythema.  Tender on exam.  Skin: Skin is warm and dry. No erythema.           Assessment & Plan:

## 2018-08-31 NOTE — Telephone Encounter (Signed)
Noted.  Will evaluate. 

## 2018-08-31 NOTE — Patient Instructions (Signed)
Stop by the lab and xray prior to leaving today. I will notify you of your results once received.   It was a pleasure meeting you!

## 2018-08-31 NOTE — Telephone Encounter (Signed)
Sean Lee called pt to reschedule Dr Glori Bickers appt for today. Pt having pain in rt lower leg for 2wks. Pain has subsided somewhat but still has pain in rt calf area; now pt has swelling and pain in rt ankle, does not see redness or streaking. Pt has problems first thing in AM to walk due to pain and swelling in rt ankle; swelling does not go down overnight with feet up.no CP or SOB but pt is concerned and would like to be seen. Dr Glori Bickers out of office; rescheduled with Gentry Fitz NP 08/31/18 at 3:20. FYI to Gentry Fitz NP.

## 2018-09-01 LAB — BASIC METABOLIC PANEL
BUN: 18 mg/dL (ref 6–23)
CO2: 25 mEq/L (ref 19–32)
Calcium: 9.5 mg/dL (ref 8.4–10.5)
Chloride: 102 mEq/L (ref 96–112)
Creatinine, Ser: 1.31 mg/dL (ref 0.40–1.50)
GFR: 72.34 mL/min (ref >60.00)
Glucose, Bld: 120 mg/dL — ABNORMAL HIGH (ref 70–99)
Potassium: 3.7 mEq/L (ref 3.5–5.1)
SODIUM: 137 meq/L (ref 135–145)

## 2018-09-01 LAB — RHEUMATOID FACTOR: Rheumatoid fact SerPl-aCnc: <14 IU/mL (ref <14)

## 2018-09-01 LAB — CBC WITH DIFFERENTIAL/PLATELET
BASOS ABS: 0.1 10*3/uL (ref 0.0–0.1)
Basophils Relative: 1.2 % (ref 0.0–3.0)
EOS ABS: 0.1 10*3/uL (ref 0.0–0.7)
Eosinophils Relative: 1.1 % (ref 0.0–5.0)
HEMATOCRIT: 40.4 % (ref 39.0–52.0)
Hemoglobin: 13.6 g/dL (ref 13.0–17.0)
LYMPHS PCT: 29.8 % (ref 12.0–46.0)
Lymphs Abs: 2.9 10*3/uL (ref 0.7–4.0)
MCHC: 33.6 g/dL (ref 30.0–36.0)
MCV: 97.6 fl (ref 78.0–100.0)
Monocytes Absolute: 1 10*3/uL (ref 0.1–1.0)
Monocytes Relative: 10.6 % (ref 3.0–12.0)
NEUTROS ABS: 5.6 10*3/uL (ref 1.4–7.7)
Neutrophils Relative %: 57.3 % (ref 43.0–77.0)
PLATELETS: 335 10*3/uL (ref 150.0–400.0)
RBC: 4.14 Mil/uL — ABNORMAL LOW (ref 4.22–5.81)
RDW: 12.6 % (ref 11.5–15.5)
WBC: 9.8 10*3/uL (ref 4.0–10.5)

## 2018-09-01 LAB — CYCLIC CITRUL PEPTIDE ANTIBODY, IGG: Cyclic Citrullin Peptide Ab: <16 UNITS

## 2018-09-01 LAB — URIC ACID: URIC ACID, SERUM: 7.9 mg/dL — AB (ref 4.0–7.8)

## 2018-09-01 LAB — SEDIMENTATION RATE: Sed Rate: 28 mm/hr — ABNORMAL HIGH (ref 0–20)

## 2018-09-02 ENCOUNTER — Other Ambulatory Visit: Payer: Self-pay | Admitting: Primary Care

## 2018-09-02 DIAGNOSIS — M1A0790 Idiopathic chronic gout, unspecified ankle and foot, without tophus (tophi): Secondary | ICD-10-CM

## 2018-09-02 MED ORDER — PREDNISONE 20 MG PO TABS: ORAL_TABLET | ORAL | 0 refills | Status: DC

## 2018-09-02 MED ORDER — ALLOPURINOL 100 MG PO TABS: 100.0000 mg | ORAL_TABLET | Freq: Every day | ORAL | 0 refills | Status: DC

## 2018-09-08 ENCOUNTER — Ambulatory Visit (INDEPENDENT_AMBULATORY_CARE_PROVIDER_SITE_OTHER): Payer: BLUE CROSS/BLUE SHIELD | Admitting: Internal Medicine

## 2018-09-08 ENCOUNTER — Encounter: Payer: Self-pay | Admitting: Internal Medicine

## 2018-09-08 VITALS — BP 140/78 | HR 94 | Ht 71.0 in | Wt 278.4 lb

## 2018-09-08 DIAGNOSIS — Z23 Encounter for immunization: Secondary | ICD-10-CM | POA: Diagnosis not present

## 2018-09-08 DIAGNOSIS — E669 Obesity, unspecified: Secondary | ICD-10-CM

## 2018-09-08 DIAGNOSIS — G4733 Obstructive sleep apnea (adult) (pediatric): Secondary | ICD-10-CM

## 2018-09-08 NOTE — Progress Notes (Signed)
HPI male never smoker followed for OSA, complicated by HBP, obesity, history CVA, gout, Unattended home sleep study 02/11/2013-mild obstructive apnea, AHI 9.7 per hour with snoring, desaturation to 79%, weight 286 pounds  ------------------------------------------------------------------------------ 06/06/15- 28 yom never smoker referred courtesy of Dr Glori Bickers for Obstructive Sleep Apnea  CPAP auto [facility]   ambien FOLLOWS FOR:Not used CPAP for 6 mths. or more due to mask ends up on the floor Did fine with CPAP until he ran out of Ambien, which prevents his restlessness at night.  09/08/2018-58 year old male never smoker followed for OSA, complicated by HBP, obesity, history CVA, gout, CPAP auto 5-20/ Download 93% compliance AHI 0.7/hour.  Usual pressure range 7.0-12.5 Body weight today 278 lbs "Loves" full face mask and autoset. No acute problems. Satisfied to continue.  ROS-see HPI   + = positive Constitutional:   No-   weight loss, night sweats, fevers, chills, fatigue, lassitude. HEENT:   No-  headaches, difficulty swallowing, tooth/dental problems, sore throat,       No-  sneezing, itching, ear ache, nasal congestion, post nasal drip,  CV:  No-   chest pain, orthopnea, PND, swelling in lower extremities, anasarca,  dizziness, palpitations Resp: No-   shortness of breath with exertion or at rest.              No-   productive cough,  No non-productive cough,  No- coughing up of blood.              No-   change in color of mucus.  No- wheezing.   Skin: No-   rash or lesions. GI:  No-   heartburn, indigestion, abdominal pain, nausea, vomiting, GU: . MS:  No-   joint pain or swelling.   Neuro-     nothing unusual Psych:  No- change in mood or affect. No depression or anxiety.  No memory loss.  OBJ- Physical Exam General- Alert, Oriented, Affect-appropriate, Distress- none acute, + overweight, alert Skin- rash-none, lesions- none, excoriation- none Lymphadenopathy- none Head-  atraumatic            Eyes- Gross vision intact, PERRLA, conjunctivae and secretions clear            Ears- Hearing, canals-normal            Nose- Clear, no-Septal dev, mucus, polyps, erosion, perforation             Throat- Mallampati III-IV , mucosa clear , drainage- none, tonsils- atrophic, + own teeth Neck- flexible , trachea midline, no stridor , thyroid nl, carotid no bruit Chest - symmetrical excursion , unlabored           Heart/CV- RRR , no murmur , no gallop  , no rub, nl s1 s2                           - JVD- none , edema- none, stasis changes- none, varices- none           Lung- clear to P&A, wheeze- none, cough- none , dullness-none, rub- none           Chest wall-  Abd-  Br/ Gen/ Rectal- Not done, not indicated Extrem- cyanosis- none, clubbing, none, atrophy- none, strength- nl Neuro- grossly intact to observation

## 2018-09-08 NOTE — Patient Instructions (Signed)
We can continue CPAP auto 5-20, mask of choice, humidifier, supplies, [facility]/ card  Order- flu vax- standard  Please call if we can help

## 2018-09-14 ENCOUNTER — Ambulatory Visit: Payer: BLUE CROSS/BLUE SHIELD | Admitting: Family Medicine

## 2018-09-28 DIAGNOSIS — L658 Other specified nonscarring hair loss: Secondary | ICD-10-CM | POA: Diagnosis not present

## 2018-09-30 DIAGNOSIS — G4733 Obstructive sleep apnea (adult) (pediatric): Secondary | ICD-10-CM | POA: Diagnosis not present

## 2018-10-29 DIAGNOSIS — G4733 Obstructive sleep apnea (adult) (pediatric): Secondary | ICD-10-CM | POA: Diagnosis not present

## 2018-11-02 ENCOUNTER — Other Ambulatory Visit: Payer: Self-pay | Admitting: Family Medicine

## 2018-11-02 NOTE — Telephone Encounter (Signed)
Please call patient and scheduled annual physical. Send back for refill after appointment is scheduled.

## 2018-11-02 NOTE — Telephone Encounter (Signed)
Last refill 04/29/18 #90/1 Last office visit  08/31/18 acute See allergy/contraindicaiton

## 2018-11-02 NOTE — Telephone Encounter (Signed)
Patient scheduled appointment on 11/03/18

## 2018-11-03 ENCOUNTER — Ambulatory Visit (INDEPENDENT_AMBULATORY_CARE_PROVIDER_SITE_OTHER): Payer: BLUE CROSS/BLUE SHIELD | Admitting: Family Medicine

## 2018-11-03 ENCOUNTER — Encounter: Payer: Self-pay | Admitting: Family Medicine

## 2018-11-03 ENCOUNTER — Other Ambulatory Visit: Payer: Self-pay

## 2018-11-03 VITALS — BP 136/70 | HR 65 | Temp 98.3°F | Ht 71.0 in | Wt 275.4 lb

## 2018-11-03 DIAGNOSIS — Z0000 Encounter for general adult medical examination without abnormal findings: Secondary | ICD-10-CM | POA: Diagnosis not present

## 2018-11-03 DIAGNOSIS — M109 Gout, unspecified: Secondary | ICD-10-CM | POA: Insufficient documentation

## 2018-11-03 DIAGNOSIS — G479 Sleep disorder, unspecified: Secondary | ICD-10-CM

## 2018-11-03 DIAGNOSIS — E669 Obesity, unspecified: Secondary | ICD-10-CM | POA: Diagnosis not present

## 2018-11-03 DIAGNOSIS — E7800 Pure hypercholesterolemia, unspecified: Secondary | ICD-10-CM

## 2018-11-03 DIAGNOSIS — Z125 Encounter for screening for malignant neoplasm of prostate: Secondary | ICD-10-CM

## 2018-11-03 DIAGNOSIS — I83813 Varicose veins of bilateral lower extremities with pain: Secondary | ICD-10-CM

## 2018-11-03 DIAGNOSIS — I10 Essential (primary) hypertension: Secondary | ICD-10-CM | POA: Diagnosis not present

## 2018-11-03 DIAGNOSIS — M1A0710 Idiopathic chronic gout, right ankle and foot, without tophus (tophi): Secondary | ICD-10-CM | POA: Diagnosis not present

## 2018-11-03 DIAGNOSIS — R7303 Prediabetes: Secondary | ICD-10-CM | POA: Diagnosis not present

## 2018-11-03 LAB — CBC WITH DIFFERENTIAL/PLATELET
BASOS ABS: 0 10*3/uL (ref 0.0–0.1)
Basophils Relative: 0.5 % (ref 0.0–3.0)
Eosinophils Absolute: 0.1 10*3/uL (ref 0.0–0.7)
Eosinophils Relative: 1.7 % (ref 0.0–5.0)
HCT: 41.8 % (ref 39.0–52.0)
Hemoglobin: 14.6 g/dL (ref 13.0–17.0)
Lymphocytes Relative: 33.6 % (ref 12.0–46.0)
Lymphs Abs: 2.9 10*3/uL (ref 0.7–4.0)
MCHC: 34.8 g/dL (ref 30.0–36.0)
MCV: 95.8 fl (ref 78.0–100.0)
Monocytes Absolute: 0.8 10*3/uL (ref 0.1–1.0)
Monocytes Relative: 9.5 % (ref 3.0–12.0)
Neutro Abs: 4.8 10*3/uL (ref 1.4–7.7)
Neutrophils Relative %: 54.7 % (ref 43.0–77.0)
Platelets: 279 10*3/uL (ref 150.0–400.0)
RBC: 4.37 Mil/uL (ref 4.22–5.81)
RDW: 12.9 % (ref 11.5–15.5)
WBC: 8.7 10*3/uL (ref 4.0–10.5)

## 2018-11-03 LAB — COMPREHENSIVE METABOLIC PANEL
ALT: 30 U/L (ref 0–53)
AST: 21 U/L (ref 0–37)
Albumin: 4 g/dL (ref 3.5–5.2)
Alkaline Phosphatase: 46 U/L (ref 39–117)
BILIRUBIN TOTAL: 1.3 mg/dL — AB (ref 0.2–1.2)
BUN: 14 mg/dL (ref 6–23)
CO2: 28 mEq/L (ref 19–32)
Calcium: 9.6 mg/dL (ref 8.4–10.5)
Chloride: 100 mEq/L (ref 96–112)
Creatinine, Ser: 1.07 mg/dL (ref 0.40–1.50)
GFR: 85.92 mL/min (ref >60.00)
Glucose, Bld: 103 mg/dL — ABNORMAL HIGH (ref 70–99)
Potassium: 3.7 mEq/L (ref 3.5–5.1)
Sodium: 136 mEq/L (ref 135–145)
TOTAL PROTEIN: 7.1 g/dL (ref 6.0–8.3)

## 2018-11-03 LAB — PSA: PSA: 0.82 ng/mL (ref 0.10–4.00)

## 2018-11-03 LAB — LIPID PANEL
Cholesterol: 147 mg/dL (ref 0–200)
HDL: 38.9 mg/dL — ABNORMAL LOW (ref >39.00)
LDL Cholesterol: 91 mg/dL (ref 0–99)
NonHDL: 108.06
Total CHOL/HDL Ratio: 4
Triglycerides: 85 mg/dL (ref 0.0–149.0)
VLDL: 17 mg/dL (ref 0.0–40.0)

## 2018-11-03 LAB — URIC ACID: Uric Acid, Serum: 8.3 mg/dL — ABNORMAL HIGH (ref 4.0–7.8)

## 2018-11-03 LAB — TSH: TSH: 0.44 u[IU]/mL (ref 0.35–4.50)

## 2018-11-03 LAB — HEMOGLOBIN A1C: Hgb A1c MFr Bld: 6.3 % (ref 4.6–6.5)

## 2018-11-03 MED ORDER — ZOLPIDEM TARTRATE 10 MG PO TABS: 10.0000 mg | ORAL_TABLET | Freq: Every evening | ORAL | 3 refills | Status: DC | PRN

## 2018-11-03 MED ORDER — OLMESARTAN MEDOXOMIL-HCTZ 40-25 MG PO TABS: 1.0000 | ORAL_TABLET | Freq: Every day | ORAL | 3 refills | Status: DC

## 2018-11-03 NOTE — Progress Notes (Signed)
Subjective:    Patient ID: Sean Lee, male    DOB: 24-Mar-1961, 58 y.o.   MRN: [id number]  HPI  Here for health maintenance exam and to review chronic medical problems   Taking care of himself   Wt Readings from Last 3 Encounters:  11/03/18 275 lb 6 oz (124.9 kg)  09/08/18 278 lb 6.4 oz (126.3 kg)  08/31/18 278 lb 8 oz (126.3 kg)   38.41 kg/m    Also not sleeping well   Eye exam - has not been in a while/ has appt soon to see opthy  He is interested in vein clinic ref in the future    Due for labs  Diet -pretty good most of the time    Colonoscopy 3/17 -polyp/adenoma - 5 year recall   Tetanus shot 3/18  utd pneumovax and flu   Prostate health  Lab Results  Component Value Date   PSA 0.78 07/22/2017   PSA 1.2 04/09/2004  Puncle had prostate cancer - diagnosed in later life  No trouble with stream  No nocturia unless he drinks water  Lab today  Gout - right foot  Takes allopurinol  Lab Results  Component Value Date   LABURIC 7.9 (H) 08/31/2018    bp is stable today  No cp or palpitations or headaches or edema  No side effects to medicines  BP Readings from Last 3 Encounters:  11/03/18 136/70  09/08/18 140/78  08/31/18 122/68     Hyperlipidemia Lab Results  Component Value Date   CHOL 158 07/22/2017   CHOL 134 11/04/2016   CHOL 145 08/28/2015   Lab Results  Component Value Date   HDL 49.50 07/22/2017   HDL 44.40 11/04/2016   HDL 52.50 08/28/2015   Lab Results  Component Value Date   LDLCALC 85 07/22/2017   LDLCALC 70 11/04/2016   LDLCALC 74 08/28/2015   Lab Results  Component Value Date   TRIG 115.0 07/22/2017   TRIG 97.0 11/04/2016   TRIG 92.0 08/28/2015   Lab Results  Component Value Date   CHOLHDL 3 07/22/2017   CHOLHDL 3 11/04/2016   CHOLHDL 3 08/28/2015   No results found for: LDLDIRECT  Due for labs   Prediabetes Lab Results  Component Value Date   HGBA1C 6.2 07/22/2017  dut out sodas  Also caffeine  Less sugar  /less sweets  Lab today   Sleep disorder  OSA also  Lorrin Mais is not working as well   Patient Active Problem List   Diagnosis Date Noted  . Gout 11/03/2018  . Chronic pain of right ankle 08/31/2018  . Routine general medical examination at a health care facility 11/01/2017  . Prostate cancer screening 06/28/2017  . Alopecia 11/04/2016  . Back pain 05/06/2016  . Sleep disorder 08/28/2015  . Colon cancer screening 08/28/2015  . Prediabetes 01/02/2013  . Varicose veins 03/21/2012  . Rectus diastasis of lower abdomen 03/21/2012  . Obstructive sleep apnea 10/15/2009  . Obesity (BMI 30-39.9) 09/02/2009  . Hyperlipidemia 02/02/2007  . HEMATURIA, MICROSCOPIC 02/02/2007  . Essential hypertension 02/01/2007  . CEREBROVASCULAR ACCIDENT, HX OF 02/01/2007   Past Medical History:  Diagnosis Date  . Contact dermatitis and other eczema due to plants (except food)   . H/O: CVA (cardiovascular accident)    Carotid doppler neg (4/08)  . Hematuria   . Obesity, unspecified   . Other and unspecified hyperlipidemia    Hx - no medication/diet controll and weight loss  . Sacroiliitis, not  elsewhere classified ([place])   . Sleep apnea    uses CPAP but not every night  . Unspecified essential hypertension    Past Surgical History:  Procedure Laterality Date  . Carotid dopplers  4/08   Negative  . HERNIA REPAIR     umbilical   Social History   Tobacco Use  . Smoking status: Never Smoker  . Smokeless tobacco: Never Used  Substance Use Topics  . Alcohol use: Yes    Alcohol/week: 4.0 standard drinks    Types: 4 Cans of beer per week    Comment: weekends  . Drug use: No   Family History  Problem Relation Age of Onset  . Hypertension Mother   . Diabetes Mother   . Hyperlipidemia Unknown        in family   . Cancer Neg Hx   . Esophageal cancer Neg Hx   . Rectal cancer Neg Hx   . Stomach cancer Neg Hx   . Colon cancer Neg Hx    Allergies  Allergen Reactions  . Benazepril Hcl      REACTION: cough   Current Outpatient Medications on File Prior to Visit  Medication Sig Dispense Refill  . allopurinol (ZYLOPRIM) 100 MG tablet Take 1 tablet (100 mg total) by mouth daily. For gout prevention. 90 tablet 0  . aspirin 325 MG tablet Take 325 mg by mouth daily.      Marland Kitchen latanoprost (XALATAN) 0.005 % ophthalmic solution Place 1 drop into both eyes at bedtime.     No current facility-administered medications on file prior to visit.      Review of Systems  Constitutional: Negative for activity change, appetite change, fatigue, fever and unexpected weight change.  HENT: Negative for congestion, rhinorrhea, sore throat and trouble swallowing.   Eyes: Negative for pain, redness, itching and visual disturbance.  Respiratory: Negative for cough, chest tightness, shortness of breath and wheezing.   Cardiovascular: Negative for chest pain and palpitations.  Gastrointestinal: Negative for abdominal pain, blood in stool, constipation, diarrhea and nausea.  Endocrine: Negative for cold intolerance, heat intolerance, polydipsia and polyuria.  Genitourinary: Negative for difficulty urinating, dysuria, frequency and urgency.  Musculoskeletal: Negative for arthralgias, joint swelling and myalgias.  Skin: Negative for pallor and rash.  Neurological: Negative for dizziness, tremors, weakness, numbness and headaches.  Hematological: Negative for adenopathy. Does not bruise/bleed easily.  Psychiatric/Behavioral: Negative for decreased concentration and dysphoric mood. The patient is not nervous/anxious.        Objective:   Physical Exam Constitutional:      General: He is not in acute distress.    Appearance: Normal appearance. He is well-developed. He is obese. He is not ill-appearing.  HENT:     Head: Normocephalic and atraumatic.     Right Ear: Tympanic membrane, ear canal and external ear normal.     Left Ear: Tympanic membrane, ear canal and external ear normal.     Nose: Nose normal.      Mouth/Throat:     Mouth: Mucous membranes are moist.     Pharynx: Oropharynx is clear.  Eyes:     General: No scleral icterus.       Right eye: No discharge.        Left eye: No discharge.     Conjunctiva/sclera: Conjunctivae normal.     Pupils: Pupils are equal, round, and reactive to light.  Neck:     Musculoskeletal: Normal range of motion and neck supple.     Thyroid:  No thyromegaly.     Vascular: No carotid bruit or JVD.  Cardiovascular:     Rate and Rhythm: Normal rate and regular rhythm.     Pulses: Normal pulses.     Heart sounds: Normal heart sounds. No gallop.      Comments: Compressible varicosities in LEs to knee bilaterally Pulmonary:     Effort: Pulmonary effort is normal. No respiratory distress.     Breath sounds: Normal breath sounds. No wheezing.  Chest:     Chest wall: No tenderness.  Abdominal:     General: Bowel sounds are normal. There is no distension or abdominal bruit.     Palpations: Abdomen is soft. There is no mass.     Tenderness: There is no abdominal tenderness.  Musculoskeletal:        General: No tenderness.     Right lower leg: No edema.     Left lower leg: No edema.     Comments: R ankle is tender  Swelling- scant  No redness or warmth or crepitus  Lymphadenopathy:     Cervical: No cervical adenopathy.  Skin:    General: Skin is warm and dry.     Coloration: Skin is not pale.     Findings: No erythema or rash.  Neurological:     Mental Status: He is alert. Mental status is at baseline.     Cranial Nerves: No cranial nerve deficit.     Motor: No abnormal muscle tone.     Coordination: Coordination normal.     Deep Tendon Reflexes: Reflexes are normal and symmetric. Reflexes normal.  Psychiatric:        Mood and Affect: Mood normal.           Assessment & Plan:   Problem List Items Addressed This Visit      Cardiovascular and Mediastinum   Essential hypertension    bp in fair control at this time  BP Readings from  Last 1 Encounters:  11/03/18 136/70   No changes needed Most recent labs reviewed  Disc lifstyle change with low sodium diet and exercise        Relevant Medications   olmesartan-hydrochlorothiazide (BENICAR HCT) 40-25 MG tablet   Other Relevant Orders   CBC with Differential/Platelet (Completed)   Comprehensive metabolic panel (Completed)   Lipid panel (Completed)   TSH (Completed)   Varicosities of leg    Both legs to knee Pt would like to eventually go to a vein clinic  Recommend supp hose for now and elevation when able      Relevant Medications   olmesartan-hydrochlorothiazide (BENICAR HCT) 40-25 MG tablet     Other   Hyperlipidemia    Disc goals for lipids and reasons to control them Rev last labs with pt Rev low sat fat diet in detail Labs today      Relevant Medications   olmesartan-hydrochlorothiazide (BENICAR HCT) 40-25 MG tablet   Other Relevant Orders   Lipid panel (Completed)   Obesity (BMI 30-39.9)    Discussed how this problem influences overall health and the risks it imposes  Reviewed plan for weight loss with lower calorie diet (via better food choices and also portion control or program like weight watchers) and exercise building up to or more than 30 minutes 5 days per week including some aerobic activity         Prediabetes    A1C today  disc imp of low glycemic diet and wt loss to prevent DM2  Relevant Orders   Hemoglobin A1c (Completed)   Sleep disorder    Recommended taking ambien (when needed) on an empty stomach to see if it works better      Prostate cancer screening    PSA today  No clinical changes       Relevant Orders   PSA (Completed)   Routine general medical examination at a health care facility - Primary    Reviewed health habits including diet and exercise and skin cancer prevention Reviewed appropriate screening tests for age  Also reviewed health mt list, fam hx and immunization status , as well as social and  family history   See HPI Labs today  Disc health habits- wt loss/diet/exercise strongly enc  Uric acid level for gout today      Gout    R ankle is still sore -but improved Uric acid today  Inc allopurinol if needed      Relevant Orders   Uric acid (Completed)

## 2018-11-03 NOTE — Patient Instructions (Addendum)
For gout -drink lots of water (64 oz per day of fluids)  Also watch diet for purines   Get some support stockings to the knee (drug store or med supply store)   To prevent diabetes  Try to get most of your carbohydrates from produce (with the exception of white potatoes)  Eat less bread/pasta/rice/snack foods/cereals/sweets and other items from the middle of the grocery store (processed carbs)   Labs today   Keep exercising  Take care of yourself

## 2018-11-04 ENCOUNTER — Telehealth: Payer: Self-pay | Admitting: *Deleted

## 2018-11-04 NOTE — Telephone Encounter (Signed)
Called pt back and no answer and VM box still full

## 2018-11-04 NOTE — Telephone Encounter (Signed)
Pt returned your call  Best number [phone]

## 2018-11-04 NOTE — Telephone Encounter (Signed)
Called pt regarding lab results and no answer and VM box is full so I couldn't leave a VM

## 2018-11-06 NOTE — Assessment & Plan Note (Signed)
Reviewed health habits including diet and exercise and skin cancer prevention Reviewed appropriate screening tests for age  Also reviewed health mt list, fam hx and immunization status , as well as social and family history   See HPI Labs today  Disc health habits- wt loss/diet/exercise strongly enc  Uric acid level for gout today

## 2018-11-06 NOTE — Assessment & Plan Note (Signed)
bp in fair control at this time  BP Readings from Last 1 Encounters:  11/03/18 136/70   No changes needed Most recent labs reviewed  Disc lifstyle change with low sodium diet and exercise

## 2018-11-06 NOTE — Assessment & Plan Note (Signed)
Recommended taking ambien (when needed) on an empty stomach to see if it works better

## 2018-11-06 NOTE — Assessment & Plan Note (Signed)
R ankle is still sore -but improved Uric acid today  Inc allopurinol if needed

## 2018-11-06 NOTE — Assessment & Plan Note (Signed)
Both legs to knee Pt would like to eventually go to a vein clinic  Recommend supp hose for now and elevation when able

## 2018-11-06 NOTE — Assessment & Plan Note (Signed)
PSA today  No clinical changes

## 2018-11-06 NOTE — Assessment & Plan Note (Signed)
Discussed how this problem influences overall health and the risks it imposes  Reviewed plan for weight loss with lower calorie diet (via better food choices and also portion control or program like weight watchers) and exercise building up to or more than 30 minutes 5 days per week including some aerobic activity    

## 2018-11-06 NOTE — Assessment & Plan Note (Signed)
A1C today  disc imp of low glycemic diet and wt loss to prevent DM2  

## 2018-11-06 NOTE — Assessment & Plan Note (Signed)
Disc goals for lipids and reasons to control them Rev last labs with pt Rev low sat fat diet in detail Labs today

## 2018-11-07 ENCOUNTER — Telehealth: Payer: Self-pay | Admitting: Family Medicine

## 2018-11-07 DIAGNOSIS — M1A0790 Idiopathic chronic gout, unspecified ankle and foot, without tophus (tophi): Secondary | ICD-10-CM

## 2018-11-07 MED ORDER — ALLOPURINOL 100 MG PO TABS: 100.0000 mg | ORAL_TABLET | Freq: Two times a day (BID) | ORAL | 1 refills | Status: DC

## 2018-11-07 NOTE — Telephone Encounter (Signed)
-----   Message from Tammi Sou, [place] sent at 11/07/2018 12:42 PM EDT ----- Pt notified of lab results and Dr. Marliss Coots comments. Pt does want to try the increase dose of allopurinol he uses [place]

## 2018-11-07 NOTE — Telephone Encounter (Signed)
I sent it -we are going up to 100 mg bid

## 2018-11-07 NOTE — Telephone Encounter (Signed)
Addressed in result notes  

## 2018-11-14 NOTE — Assessment & Plan Note (Signed)
Importance of long-term, sustained effort to lose weight was emphasized.

## 2018-11-14 NOTE — Assessment & Plan Note (Signed)
He describes continued benefit from CPAP with improved sleep. Download confirms good compliance and congtrol Plan - continue CPAP  Auto 5-20

## 2018-11-29 ENCOUNTER — Other Ambulatory Visit: Payer: Self-pay | Admitting: Primary Care

## 2018-11-29 DIAGNOSIS — G4733 Obstructive sleep apnea (adult) (pediatric): Secondary | ICD-10-CM | POA: Diagnosis not present

## 2018-11-29 DIAGNOSIS — M1A0790 Idiopathic chronic gout, unspecified ankle and foot, without tophus (tophi): Secondary | ICD-10-CM

## 2018-12-29 DIAGNOSIS — G4733 Obstructive sleep apnea (adult) (pediatric): Secondary | ICD-10-CM | POA: Diagnosis not present

## 2019-01-29 DIAGNOSIS — G4733 Obstructive sleep apnea (adult) (pediatric): Secondary | ICD-10-CM | POA: Diagnosis not present

## 2019-02-25 ENCOUNTER — Other Ambulatory Visit: Payer: Self-pay | Admitting: Family Medicine

## 2019-02-27 NOTE — Telephone Encounter (Signed)
This looks to be a bit early  Let me know if I am wrong  If so-send back to me when due thanks

## 2019-02-27 NOTE — Telephone Encounter (Signed)
Electronic refill request Ambien Last refill 11/03/18 #30/3 Last office visit 11/03/18

## 2019-02-28 DIAGNOSIS — G4733 Obstructive sleep apnea (adult) (pediatric): Secondary | ICD-10-CM | POA: Diagnosis not present

## 2019-03-31 DIAGNOSIS — G4733 Obstructive sleep apnea (adult) (pediatric): Secondary | ICD-10-CM | POA: Diagnosis not present

## 2019-05-01 DIAGNOSIS — G4733 Obstructive sleep apnea (adult) (pediatric): Secondary | ICD-10-CM | POA: Diagnosis not present

## 2019-05-31 DIAGNOSIS — G4733 Obstructive sleep apnea (adult) (pediatric): Secondary | ICD-10-CM | POA: Diagnosis not present

## 2019-07-01 DIAGNOSIS — G4733 Obstructive sleep apnea (adult) (pediatric): Secondary | ICD-10-CM | POA: Diagnosis not present

## 2019-07-25 ENCOUNTER — Ambulatory Visit (INDEPENDENT_AMBULATORY_CARE_PROVIDER_SITE_OTHER): Payer: BC Managed Care – PPO | Admitting: Family Medicine

## 2019-07-25 ENCOUNTER — Other Ambulatory Visit: Payer: Self-pay

## 2019-07-25 ENCOUNTER — Encounter: Payer: Self-pay | Admitting: Family Medicine

## 2019-07-25 VITALS — BP 124/82 | HR 84 | Temp 96.8°F | Ht 71.0 in | Wt 281.5 lb

## 2019-07-25 DIAGNOSIS — E669 Obesity, unspecified: Secondary | ICD-10-CM

## 2019-07-25 DIAGNOSIS — M25471 Effusion, right ankle: Secondary | ICD-10-CM

## 2019-07-25 DIAGNOSIS — M1A0710 Idiopathic chronic gout, right ankle and foot, without tophus (tophi): Secondary | ICD-10-CM

## 2019-07-25 DIAGNOSIS — M19071 Primary osteoarthritis, right ankle and foot: Secondary | ICD-10-CM | POA: Insufficient documentation

## 2019-07-25 DIAGNOSIS — Z23 Encounter for immunization: Secondary | ICD-10-CM

## 2019-07-25 DIAGNOSIS — L638 Other alopecia areata: Secondary | ICD-10-CM | POA: Diagnosis not present

## 2019-07-25 LAB — COMPREHENSIVE METABOLIC PANEL
ALT: 21 U/L (ref 0–53)
AST: 15 U/L (ref 0–37)
Albumin: 3.9 g/dL (ref 3.5–5.2)
Alkaline Phosphatase: 47 U/L (ref 39–117)
BUN: 13 mg/dL (ref 6–23)
CO2: 29 mEq/L (ref 19–32)
Calcium: 9.9 mg/dL (ref 8.4–10.5)
Chloride: 99 mEq/L (ref 96–112)
Creatinine, Ser: 1.02 mg/dL (ref 0.40–1.50)
GFR: 90.57 mL/min (ref >60.00)
Glucose, Bld: 138 mg/dL — ABNORMAL HIGH (ref 70–99)
Potassium: 3.7 mEq/L (ref 3.5–5.1)
Sodium: 135 mEq/L (ref 135–145)
Total Bilirubin: 1 mg/dL (ref 0.2–1.2)
Total Protein: 6.8 g/dL (ref 6.0–8.3)

## 2019-07-25 LAB — URIC ACID: Uric Acid, Serum: 7.7 mg/dL (ref 4.0–7.8)

## 2019-07-25 NOTE — Assessment & Plan Note (Signed)
In the past due to gout but today no erythema or pain  Last xr showed possible old bone fragments from prev fx  No tenderness today  Swelling noted over medial malleolus  cmet and uric acid drawn  Ref to ortho for further eval  Also for varicose veins-strongly rec supp socks to the knee

## 2019-07-25 NOTE — Assessment & Plan Note (Signed)
Discussed how this problem influences overall health and the risks it imposes  Reviewed plan for weight loss with lower calorie diet (via better food choices and also portion control or program like weight watchers) and exercise building up to or more than 30 minutes 5 days per week including some aerobic activity   Pt has gained wt due to his change in schedule at work -needing to eat at convenience stores (eating fried foods) Enc strongly to stop this habit and pack cooler with healthier options  Will also make effort to exercise 3 d a week he can

## 2019-07-25 NOTE — Patient Instructions (Addendum)
Start planning a way to exercise when you stay in a hotel  Also make 1/2 sandwich or salad for your cooler at work  Stop eating at convenience stores   I don't think gout is causing your ankle swelling today -since there is little pain and no redness or heat I do want to check the uric acid level to make sure allopurinol is working  Support socks to the knee may help swelling  I will refer you to an orthopedic specialist to further evaluate the ankle   Flu shot today

## 2019-07-25 NOTE — Assessment & Plan Note (Signed)
Pt's R ankle swelling is less likely gout this time due to lack of tenderness/erythema and general pain  That being said- cmet and uric acid ordered to see if allopurinol is working   Recommend abstaining from processed foods high in purines  Also enc him to keep up a good water intake

## 2019-07-25 NOTE — Progress Notes (Signed)
Subjective:    Patient ID: Sean Lee, male    DOB: 04/27/1961, 58 y.o.   MRN: [id number]   This visit occurred during the SARS-CoV-2 public health emergency.  Safety protocols were in place, including screening questions prior to the visit, additional usage of staff PPE, and extensive cleaning of exam room while observing appropriate contact time as indicated for disinfecting solutions.    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 to walk   Pulled his bike out to start riding again   He takes a cooler with him to work with snacks (chips, nuts , fruit , celery)  Drinks water  Eats too much when he gets home   Eating too much fried foods from convenience stores/fried food    Last ankle film DG Ankle Complete Right (Accession [id number]) (Order [id number]) Imaging Date: 08/31/2018 Department: [facility] Ordering/Authorizing: Pleas Koch, NP  Exam Information  Status Exam Begun  Exam Ended   Final [99] 08/31/2018 4:10 PM 08/31/2018 4:10 PM  PACS Intelerad Image Link  Show images for DG Ankle Complete Right  Study Result  CLINICAL DATA:  Chronic swelling and pain.  EXAM: RIGHT ANKLE - COMPLETE 3+ VIEW  COMPARISON:  03/28/2014.  FINDINGS: Diffuse soft tissue swelling. Diffuse degenerative change. Tiny bony densities noted adjacent to the distal tip of the medial malleolus consistent with tiny fracture fragment, age undetermined.  IMPRESSION: Diffuse soft tissue swelling. Tiny bony densities noted  adjacent to the distal tip of the medial malleolus consistent with a tiny fracture fragment, age undetermined.   Electronically Signed   By: Marcello Moores  Register   On: 09/01/2018 06:02    H/o gout in the past  Lab Results  Component Value Date   LABURIC 8.3 (H) 11/03/2018   takes allopurinol 100 mg bid  He has taken this since then   This flare started about 2 weeks - around [place] and cheese  Drinking lots of water  Had a little alcohol for the holiday - liquor/whisky  Right ankle swelling Swelling is bothering him more than pain  Not red  Not terribly tender right now  Pain occurs when he walks around   No prn medicine - did not hurt badly enough    Lab Results  Component Value Date   CREATININE 1.07 11/03/2018   BUN 14 11/03/2018   NA 136 11/03/2018   K 3.7 11/03/2018   CL 100 11/03/2018   CO2 28 11/03/2018   Patient Active Problem List   Diagnosis Date Noted  . Right ankle swelling 07/25/2019  . Gout 11/03/2018  . Chronic pain of right ankle 08/31/2018  . Routine general medical examination at a health care facility 11/01/2017  . Prostate cancer screening 06/28/2017  . Alopecia 11/04/2016  . Back pain 05/06/2016  . Sleep disorder 08/28/2015  . Colon cancer screening 08/28/2015  . Prediabetes 01/02/2013  . Varicosities  of leg 03/21/2012  . Rectus diastasis of lower abdomen 03/21/2012  . Obstructive sleep apnea 10/15/2009  . Obesity (BMI 30-39.9) 09/02/2009  . Hyperlipidemia 02/02/2007  . HEMATURIA, MICROSCOPIC 02/02/2007  . Essential hypertension 02/01/2007  . CEREBROVASCULAR ACCIDENT, HX OF 02/01/2007   Past Medical History:  Diagnosis Date  . Contact dermatitis and other eczema due to plants (except food)   . H/O: CVA (cardiovascular accident)    Carotid doppler neg (4/08)  . Hematuria   . Obesity, unspecified   . Other and unspecified hyperlipidemia    Hx - no medication/diet controll and weight loss  . Sacroiliitis, not  elsewhere classified ([place])   . Sleep apnea    uses CPAP but not every night  . Unspecified essential hypertension    Past Surgical History:  Procedure Laterality Date  . Carotid dopplers  4/08   Negative  . HERNIA REPAIR     umbilical   Social History   Tobacco Use  . Smoking status: Never Smoker  . Smokeless tobacco: Never Used  Substance Use Topics  . Alcohol use: Yes    Alcohol/week: 4.0 standard drinks    Types: 4 Cans of beer per week    Comment: weekends  . Drug use: No   Family History  Problem Relation Age of Onset  . Hypertension Mother   . Diabetes Mother   . Hyperlipidemia Unknown        in family   . Cancer Neg Hx   . Esophageal cancer Neg Hx   . Rectal cancer Neg Hx   . Stomach cancer Neg Hx   . Colon cancer Neg Hx    Allergies  Allergen Reactions  . Benazepril Hcl     REACTION: cough   Current Outpatient Medications on File Prior to Visit  Medication Sig Dispense Refill  . allopurinol (ZYLOPRIM) 100 MG tablet Take 1 tablet (100 mg total) by mouth 2 (two) times daily. For gout prevention. 180 tablet 1  . aspirin 325 MG tablet Take 325 mg by mouth daily.      Marland Kitchen latanoprost (XALATAN) 0.005 % ophthalmic solution Place 1 drop into both eyes at bedtime.    Marland Kitchen olmesartan-hydrochlorothiazide (BENICAR HCT) 40-25 MG tablet Take 1 tablet by mouth daily. 90 tablet 3   No current facility-administered medications on file prior to visit.      Review of Systems  Constitutional: Negative for activity change, appetite change, fatigue, fever and unexpected weight change.  HENT: Negative for congestion, rhinorrhea, sore throat and trouble swallowing.   Eyes: Negative for pain, redness, itching and visual disturbance.  Respiratory: Negative for cough, chest tightness, shortness of breath and wheezing.   Cardiovascular: Negative for chest pain and palpitations.  Gastrointestinal: Negative for abdominal pain, blood in stool, constipation, diarrhea and nausea.   Endocrine: Negative for cold intolerance, heat intolerance, polydipsia and polyuria.  Genitourinary: Negative for difficulty urinating, dysuria, frequency and urgency.  Musculoskeletal: Positive for arthralgias. Negative for joint swelling and myalgias.       Swollen L ankle w/o redness or heat  occ painful -not today  Skin: Negative for pallor and rash.  Neurological: Negative for dizziness, tremors, weakness, numbness and headaches.  Hematological: Negative for adenopathy. Does not bruise/bleed easily.  Psychiatric/Behavioral: Negative for decreased concentration and dysphoric mood. The patient is not nervous/anxious.        Objective:   Physical Exam Constitutional:      Appearance: Normal appearance. He is obese. He is not ill-appearing or diaphoretic.  HENT:     Head: Normocephalic and atraumatic.     Mouth/Throat:     Mouth: Mucous membranes are moist.  Eyes:     General: No scleral icterus.    Extraocular Movements: Extraocular movements intact.     Conjunctiva/sclera: Conjunctivae normal.     Pupils: Pupils are equal, round, and reactive to light.  Neck:     Musculoskeletal: Normal range of motion and neck supple.  Cardiovascular:     Rate and Rhythm: Normal rate and regular rhythm.     Pulses: Normal pulses.     Heart sounds: Normal heart sounds.     Comments: Nl pedal pulses   Varicosities mod to severe lower legs (compressible and non tender) Pulmonary:     Effort: Pulmonary effort is normal. No respiratory distress.     Breath sounds: Normal breath sounds. No wheezing or rales.  Musculoskeletal:     Right ankle: He exhibits swelling. He exhibits normal range of motion, no ecchymosis, no deformity and normal pulse. Tenderness. Head of 5th metatarsal tenderness found. No lateral malleolus, no medial malleolus and no proximal fibula tenderness found. Achilles tendon normal.     Left lower leg: No edema.     Comments: R ankle/foot Only tender area is head of 5th MT   Swelling -mostly over medial malleolus  ? If effusion No redness  No warmth  Nl rom of ankle  Pes pedis noted worse on R   Varicosities noted   Lymphadenopathy:     Cervical: No cervical adenopathy.  Skin:    General: Skin is warm and dry.     Coloration: Skin is not pale.     Findings: No erythema or rash.  Neurological:     Mental Status: He is alert.     Cranial Nerves: No cranial nerve deficit.     Motor: No weakness.     Gait: Gait normal.     Deep Tendon Reflexes: Reflexes normal.  Psychiatric:        Mood and Affect: Mood normal.     Comments: Pleasant            Assessment & Plan:   Problem List Items Addressed This Visit      Other   Obesity (BMI 30-39.9)    Discussed how this problem influences overall health and the risks it imposes  Reviewed plan for weight loss with lower calorie diet (via better food choices and also portion control or program like weight watchers) and exercise building up to or more than 30 minutes 5 days per week including some aerobic activity   Pt has gained wt due to his change in schedule at work -needing to eat at convenience stores (eating fried foods) Enc strongly to stop this habit and pack cooler with healthier options  Will also make effort to exercise 3 d a week he can        Gout    Pt's R ankle swelling is less likely gout this time due to lack of tenderness/erythema and general pain  That being said- cmet and uric acid ordered to see if allopurinol is working   Recommend abstaining from processed foods high in purines  Also enc him to keep up a good water intake      Relevant Orders   Uric acid   Comprehensive metabolic panel   Right ankle swelling - Primary    In the past due to gout but today no erythema or pain  Last xr  showed possible old bone fragments from prev fx  No tenderness today  Swelling noted over medial malleolus  cmet and uric acid drawn  Ref to ortho for further eval  Also for varicose  veins-strongly rec supp socks to the knee      Relevant Orders   Ambulatory referral to Orthopedic Surgery

## 2019-07-26 ENCOUNTER — Encounter: Payer: Self-pay | Admitting: *Deleted

## 2019-08-01 DIAGNOSIS — M76821 Posterior tibial tendinitis, right leg: Secondary | ICD-10-CM | POA: Diagnosis not present

## 2019-08-01 DIAGNOSIS — M25571 Pain in right ankle and joints of right foot: Secondary | ICD-10-CM | POA: Diagnosis not present

## 2019-08-31 DIAGNOSIS — G4733 Obstructive sleep apnea (adult) (pediatric): Secondary | ICD-10-CM | POA: Diagnosis not present

## 2019-09-14 ENCOUNTER — Ambulatory Visit: Payer: BLUE CROSS/BLUE SHIELD | Admitting: Internal Medicine

## 2019-10-01 DIAGNOSIS — G4733 Obstructive sleep apnea (adult) (pediatric): Secondary | ICD-10-CM | POA: Diagnosis not present

## 2019-10-15 ENCOUNTER — Encounter: Payer: Self-pay | Admitting: Internal Medicine

## 2019-10-17 ENCOUNTER — Other Ambulatory Visit: Payer: Self-pay

## 2019-10-17 ENCOUNTER — Ambulatory Visit (INDEPENDENT_AMBULATORY_CARE_PROVIDER_SITE_OTHER): Payer: BC Managed Care – PPO | Admitting: Internal Medicine

## 2019-10-17 ENCOUNTER — Encounter: Payer: Self-pay | Admitting: Internal Medicine

## 2019-10-17 VITALS — BP 134/74 | HR 67 | Temp 97.2°F | Ht 71.0 in | Wt 280.0 lb

## 2019-10-17 DIAGNOSIS — E669 Obesity, unspecified: Secondary | ICD-10-CM

## 2019-10-17 DIAGNOSIS — G4733 Obstructive sleep apnea (adult) (pediatric): Secondary | ICD-10-CM

## 2019-10-17 NOTE — Patient Instructions (Signed)
Order- DME [facility]-  Please replace mask of choice and supplies, continue auto 5-20, humidifier, AirView/ card  Please aim to wear your CPAP al night, every night. If it uncomfortable please let us know.

## 2019-10-17 NOTE — Progress Notes (Signed)
HPI male never smoker followed for OSA, complicated by HBP, obesity, history CVA, gout, Unattended home sleep study 02/11/2013-mild obstructive apnea, AHI 9.7 per hour with snoring, desaturation to 79%, weight 286 pounds  ------------------------------------------------------------------------------  09/08/2018-59 year old male never smoker followed for OSA, Insomnia, complicated by HBP, obesity, history CVA, gout, CPAP auto 5-20/ [facility] Download 93% compliance AHI 0.7/hour.  Usual pressure range 7.0-12.5 Body weight today 278 lbs "Loves" full face mask and autoset. No acute problems. Satisfied to continue.  10/17/19- 59 year old male never smoker followed for OSA, Insomnia, complicated by HBP, obesity, history CVA, gout, CPAP auto 5-20/ [facility] Download compliance 67%, AHI 0.8/ hr Body weight today 280 lbs -----f/u OSA. Breathing is at patient's baseline. Had DOT physical Likes current mask. "Forgets to put it on". Discussed compliance goals, importance to DOT. Pressure is ok. No interval medical events or concerns reported.   ROS-see HPI   + = positive Constitutional:   No-   weight loss, night sweats, fevers, chills, fatigue, lassitude. HEENT:   No-  headaches, difficulty swallowing, tooth/dental problems, sore throat,       No-  sneezing, itching, ear ache, nasal congestion, post nasal drip,  CV:  No-   chest pain, orthopnea, PND, swelling in lower extremities, anasarca,  dizziness, palpitations Resp: No-   shortness of breath with exertion or at rest.              No-   productive cough,  No non-productive cough,  No- coughing up of blood.              No-   change in color of mucus.  No- wheezing.   Skin: No-   rash or lesions. GI:  No-   heartburn, indigestion, abdominal pain, nausea, vomiting, GU: . MS:  No-   joint pain or swelling.   Neuro-     nothing unusual Psych:  No- change in mood or affect. No depression or anxiety.  No memory loss.  OBJ- Physical Exam General-  Alert, Oriented, Affect-appropriate, Distress- none acute, + overweight, alert Skin- rash-none, lesions- none, excoriation- none Lymphadenopathy- none Head- atraumatic            Eyes- Gross vision intact, PERRLA, conjunctivae and secretions clear            Ears- Hearing, canals-normal            Nose- Clear, no-Septal dev, mucus, polyps, erosion, perforation             Throat- Mallampati III-IV , mucosa clear , drainage- none, tonsils- atrophic, + own teeth Neck- flexible , trachea midline, no stridor , thyroid nl, carotid no bruit Chest - symmetrical excursion , unlabored           Heart/CV- RRR , no murmur , no gallop  , no rub, nl s1 s2                           - JVD- none , edema- none, stasis changes- none, varices- none           Lung- clear to P&A, wheeze- none, cough- none , dullness-none, rub- none           Chest wall-  Abd-  Br/ Gen/ Rectal- Not done, not indicated Extrem- cyanosis- none, clubbing, none, atrophy- none, strength- nl Neuro- grossly intact to observation

## 2019-10-22 NOTE — Assessment & Plan Note (Signed)
Benefits from use. Emphasized compliance goals, comfort, sleep hygiene. Plan- Continue auto 5-20

## 2019-10-22 NOTE — Assessment & Plan Note (Signed)
Strongly in his interest to bring his weight down as discussed.  Consider bariatric referral.

## 2019-10-29 DIAGNOSIS — G4733 Obstructive sleep apnea (adult) (pediatric): Secondary | ICD-10-CM | POA: Diagnosis not present

## 2019-11-28 DIAGNOSIS — H401131 Primary open-angle glaucoma, bilateral, mild stage: Secondary | ICD-10-CM | POA: Diagnosis not present

## 2019-11-28 DIAGNOSIS — H524 Presbyopia: Secondary | ICD-10-CM | POA: Diagnosis not present

## 2019-12-06 DIAGNOSIS — G4733 Obstructive sleep apnea (adult) (pediatric): Secondary | ICD-10-CM | POA: Diagnosis not present

## 2020-01-05 DIAGNOSIS — G4733 Obstructive sleep apnea (adult) (pediatric): Secondary | ICD-10-CM | POA: Diagnosis not present

## 2020-01-16 ENCOUNTER — Other Ambulatory Visit: Payer: Self-pay | Admitting: Family Medicine

## 2020-01-16 NOTE — Telephone Encounter (Signed)
Please schedule an appt for f/u and refill until then

## 2020-01-16 NOTE — Telephone Encounter (Signed)
Hasn't had a CPE in over a year and no future appts., please advise

## 2020-01-17 NOTE — Telephone Encounter (Signed)
Med refilled once and Carrie will reach out to pt to try and get appt scheduled  

## 2020-01-17 NOTE — Telephone Encounter (Signed)
I left a message on patient's answering machine to return my call.  Patient needs to schedule a follow up appointment with Dr.Tower.

## 2020-02-06 DIAGNOSIS — G4733 Obstructive sleep apnea (adult) (pediatric): Secondary | ICD-10-CM | POA: Diagnosis not present

## 2020-02-20 DIAGNOSIS — I8311 Varicose veins of right lower extremity with inflammation: Secondary | ICD-10-CM | POA: Diagnosis not present

## 2020-02-20 DIAGNOSIS — I8312 Varicose veins of left lower extremity with inflammation: Secondary | ICD-10-CM | POA: Diagnosis not present

## 2020-03-11 ENCOUNTER — Other Ambulatory Visit: Payer: Self-pay

## 2020-03-11 DIAGNOSIS — G4733 Obstructive sleep apnea (adult) (pediatric): Secondary | ICD-10-CM | POA: Diagnosis not present

## 2020-03-11 MED ORDER — OLMESARTAN MEDOXOMIL-HCTZ 40-25 MG PO TABS: 1.0000 | ORAL_TABLET | Freq: Every day | ORAL | 0 refills | Status: DC

## 2020-03-11 NOTE — Telephone Encounter (Signed)
[place] Night - Client TELEPHONE ADVICE RECORD [facility] Patient Name: Sean Lee Gender: Male DOB: April 07, 1961 Age: 59 Y 3 M 1 D Return Phone Number: [phone] (Primary), [phone] (Secondary) Address: City/State/Zip: [place] [address] Client [facility] Night - Client Client Site [facility] Physician Tower, Roque Lias - MD Contact Type Call Who Is Calling Patient / Member / Family / Caregiver Call Type Triage / Clinical Relationship To Patient Self Return Phone Number [phone] (Primary) Chief Complaint Prescription Refill or Medication Request (non symptomatic) Reason for Call Medication Question / Request Initial Comment Caller states he is on vacation and he forgot his [facility]. The medication is Benicar. Caller states he will be on vacation for a week and will need his medication he is currently non symptomatic. Translation No Nurse Assessment Nurse: Thad Ranger, RN, Langley Gauss Date/Time (Eastern Time): 03/09/2020 4:23:17 PM Please select the assessment type ---Refill Additional Documentation ---Caller states he is on vacation and he forgot his medication and [facility]. The medication is Benicar. Caller states he will be on vacation for a week and will need his medication. Covid screen neg. Denies new/worsening s/s. Does the patient have enough medication to last until the office opens? ---No Additional Documentation ---Advised to [facility] to see if pharm will allow a loaner dose to last until Mon, and if not, have his Rx trans from [facility]. Advised to call [facility] on Mon am during reg bus hrs for the remainder of the refill needed. Disp. Time Eilene Ghazi Time) Disposition Final User 03/09/2020 4:09:07 PM Send To RN Personal Richardson Landry, RN, Samantha 03/09/2020  4:12:36 PM Send To RN Personal Richardson Landry, RN, Samantha 03/09/2020 4:29:05 PM Clinical Call Yes Thad Ranger, RN, Langley Gauss

## 2020-03-11 NOTE — Telephone Encounter (Signed)
[place] [facility] Night - Client >>>Contains Verbal Order - Signature Required<<< TELEPHONE ADVICE RECORD [facility] Patient Name: Sean Lee Gender: Male DOB: Dec 30, 1960 Age: 59 Y 3 M 2 D Return Phone Number: [phone] (Primary), [phone] (Secondary) Address: City/State/Zip: [place] [address] Client [facility] Night - Client Client Site [place] Physician Tower, Roque Lias - MD Contact Type Call Who Is Calling Patient / Member / Family / Caregiver Call Type Triage / Clinical Relationship To Patient Self Return Phone Number [phone] (Primary) Chief Complaint Prescription Refill or Medication Request (non symptomatic) Reason for Call Medication Question / Request Initial Comment caller is on vacation and forgot his blood pressure medication, he's in [place] until Friday, Caller his home pharmacy states they cant transfer the script because there are no more refills and only the doctor can call it in now. Caller states he's been without his medication since Friday-- caller [facility] [phone] Translation No Nurse Assessment Nurse: Cherre Robins, RN, Ria Comment Date/Time (Eastern Time): 03/10/2020 1:48:00 PM Confirm and document reason for call. If symptomatic, describe symptoms. ---Caller states he forgot his BP rx at home and he is out of town and has no refills. States he last has his medication on Friday. States he has a slight headache. No fever. Has the patient had close contact with a person known or suspected to have the novel coronavirus illness OR traveled / lives in area with major community spread (including international travel) in the last 14 days from the onset of symptoms? * If Asymptomatic, screen for exposure and travel within the last 14 days. ---No Does the patient have any new or worsening symptoms? ---Yes Will a triage be completed? ---Yes Related  visit to physician within the last 2 weeks? ---No Does the PT have any chronic conditions? (i.e. diabetes, asthma, this includes High risk factors for pregnancy, etc.) ---Yes List chronic conditions. ---HTN Is this a behavioral health or substance abuse call? ---No Nurse: Cherre Robins, RN, Ria Comment Date/Time (Eastern Time): 03/10/2020 1:59:29 PM Please select the assessment type ---Refill PLEASE NOTE: All timestamps contained within this report are represented as [place] Standard Time. CONFIDENTIALTY NOTICE: This fax transmission is intended only for the addressee. It contains information that is legally privileged, confidential or otherwise protected from use or disclosure. If you are not the intended recipient, you are strictly prohibited from reviewing, disclosing, copying using or disseminating any of this information or taking any action in reliance on or regarding this information. If you have received this fax in error, please notify us immediately by telephone so that we can arrange for its return to [place]. Phone: [phone], Toll-Free: [phone], Fax: [phone] Page: 2 of 4 Call Id: [id number] Nurse Assessment Does the patient have enough medication to last until the office opens? ---Unable to obtain loaner dose from Pharmacy Does the client directives allow for assistance with medications after hours? ---Yes Was the medication filled within the last 6 months? ---Yes What is the name of the medication, dose and instructions as listed on the bottle? ---Olmesartan Pharmacy name and phone number where most recently filled. ---[place], [place] Guidelines Guideline Title Affirmed Question Affirmed Notes Nurse Date/Time (Eastern Time) Headache [1] MODERATE headache (e.g., interferes with normal activities) AND [2] present > 24 hours AND [3] unexplained (Exceptions: analgesics not tried, typical migraine, or headache part of  viral illness) Weiss-Hilton, RN, Ria Comment 03/10/2020 1:49:29 PM Disp. Time Eilene Ghazi  Time) Disposition Final User 03/10/2020 2:09:25 PM Pharmacy Call Weiss-Hilton, RN, Ria Comment Reason: Verbal order called into pharmacist Merrilee Seashore per client directive to call in maintenance med quantity sufficient until the office opensone pill. 03/10/2020 1:58:38 PM See PCP within 24 Hours Yes Weiss-Hilton, RN, Ivonne Andrew Disagree/Comply Comply Caller Understands Yes PreDisposition InappropriateToAsk Care Advice Given Per Guideline SEE PCP WITHIN 24 HOURS: * IF OFFICE WILL BE OPEN: You need to be examined within the next 24 hours. Call your doctor (or NP/PA) when the office opens and make an appointment. PAIN MEDICINES: * For pain relief, you can take either acetaminophen, ibuprofen, or naproxen. * They are over-the-counter (OTC) pain drugs. You can buy them at the drugstore. * Use the lowest amount of medicine that makes your pain better. PAIN MEDICINES - EXTRA NOTES AND WARNINGS: * Before taking any medicine, read all the instructions on the package. REST: * Lie down in a dark quiet place and relax until feeling better. LOCAL COLD: * Apply a cold wet washcloth or cold pack to the forehead for 20 minutes. STRETCHING: * Stretch and massage any tight neck muscles. CALL BACK IF: * You become worse. CARE ADVICE given per Headache (Adult) guideline. PLEASE NOTE: All timestamps contained within this report are represented as [place] Standard Time. CONFIDENTIALTY NOTICE: This fax transmission is intended only for the addressee. It contains information that is legally privileged, confidential or otherwise protected from use or disclosure. If you are not the intended recipient, you are strictly prohibited from reviewing, disclosing, copying using or disseminating any of this information or taking any action in reliance on or regarding this information. If you have received this fax in error, please notify us immediately  by telephone so that we can arrange for its return to [place]. Phone: [phone], Toll-Free: [phone], Fax: [phone] Page: 3 of 4 Call Id: [id number] Verbal Orders/Maintenance Medications Medication Refill Route Dosage Regime Duration Admin Instructions User Name Olmesartan 40-25. Quantity 1. No refills Yes Oral Take one every day Take one every day Weiss-Hilton, RN, Ria Comment Comments User: Carolan Clines, RN Date/Time Eilene Ghazi Time): 03/10/2020 1:51:02 PM States he had weakness yesterday to where his "legs almost gave out from him" where he had to come back to the hotel. No weakness today. User: Carolan Clines, RN Date/Time Eilene Ghazi Time): 03/10/2020 2:12:17 PM RN updated caller that verbal order has been called into his home pharmacy for one pill and to call the office tomorrow for the rest of medication needed until he goes home. User: Carolan Clines, RN Date/Time Eilene Ghazi Time): 03/10/2020 2:12:50 PM RN updated caller that verbal order has been called into his home pharmacy for one pill and to call the office tomorrow for the rest of medication needed until he goes home. Caller voiced understanding. Referrals GO TO FACILITY UNDECIDED

## 2020-03-11 NOTE — Telephone Encounter (Signed)
Patient called in stating he is at pharmacy and would like the rest of the pills sent in right now. Patient stated he's been waiting 30 minutes and would like this handled ASAP. Please advise.

## 2020-03-11 NOTE — Telephone Encounter (Signed)
Pt notified 5 pills sent to pharmacy

## 2020-03-11 NOTE — Telephone Encounter (Signed)
Patient called to schedule a physical.  Patient said he forgot to take his bp medication with him on vacation.  Patient was notified 1 pill was sent to pharmacy.  Patient needs enough medication until Friday (5 days)

## 2020-03-19 ENCOUNTER — Telehealth: Payer: Self-pay

## 2020-03-19 NOTE — Telephone Encounter (Signed)
[facility] - Client Nonclinical Telephone Record AccessNurse Client [place] [facility] - Client Client Site [facility] Physician Loura Pardon - MD Contact Type Call Who Is Calling Patient / Member / Family / Caregiver Caller Name Nolin Grell Caller Phone Number [phone] Patient Name Sean Lee Patient DOB 12-Dec-1960 Call Type Message Only Information Provided Reason for Call Request for General Office Information Initial Comment Needing to verify appt date and time. Disp. Time Disposition Final User 03/19/2020 7:11:08 AM General Information Provided Yes Silvano Rusk Call Closed By: Silvano Rusk Transaction Date/Time: 03/19/2020 7:08:40 AM (ET)

## 2020-03-26 DIAGNOSIS — I8311 Varicose veins of right lower extremity with inflammation: Secondary | ICD-10-CM | POA: Diagnosis not present

## 2020-03-26 DIAGNOSIS — I8312 Varicose veins of left lower extremity with inflammation: Secondary | ICD-10-CM | POA: Diagnosis not present

## 2020-04-02 ENCOUNTER — Other Ambulatory Visit: Payer: Self-pay

## 2020-04-02 ENCOUNTER — Ambulatory Visit (INDEPENDENT_AMBULATORY_CARE_PROVIDER_SITE_OTHER): Payer: BC Managed Care – PPO | Admitting: Family Medicine

## 2020-04-02 ENCOUNTER — Encounter: Payer: Self-pay | Admitting: Family Medicine

## 2020-04-02 VITALS — BP 128/70 | HR 83 | Temp 97.6°F | Ht 71.25 in | Wt 277.1 lb

## 2020-04-02 DIAGNOSIS — R7303 Prediabetes: Secondary | ICD-10-CM | POA: Diagnosis not present

## 2020-04-02 DIAGNOSIS — Z0000 Encounter for general adult medical examination without abnormal findings: Secondary | ICD-10-CM | POA: Diagnosis not present

## 2020-04-02 DIAGNOSIS — E66812 Obesity, class 2: Secondary | ICD-10-CM

## 2020-04-02 DIAGNOSIS — Z6838 Body mass index (BMI) 38.0-38.9, adult: Secondary | ICD-10-CM

## 2020-04-02 DIAGNOSIS — I10 Essential (primary) hypertension: Secondary | ICD-10-CM

## 2020-04-02 DIAGNOSIS — F5104 Psychophysiologic insomnia: Secondary | ICD-10-CM

## 2020-04-02 DIAGNOSIS — G4700 Insomnia, unspecified: Secondary | ICD-10-CM | POA: Insufficient documentation

## 2020-04-02 DIAGNOSIS — E7800 Pure hypercholesterolemia, unspecified: Secondary | ICD-10-CM | POA: Diagnosis not present

## 2020-04-02 DIAGNOSIS — Z125 Encounter for screening for malignant neoplasm of prostate: Secondary | ICD-10-CM | POA: Diagnosis not present

## 2020-04-02 LAB — COMPREHENSIVE METABOLIC PANEL
ALT: 22 U/L (ref 0–53)
AST: 20 U/L (ref 0–37)
Albumin: 4 g/dL (ref 3.5–5.2)
Alkaline Phosphatase: 53 U/L (ref 39–117)
BUN: 16 mg/dL (ref 6–23)
CO2: 27 mEq/L (ref 19–32)
Calcium: 9.6 mg/dL (ref 8.4–10.5)
Chloride: 100 mEq/L (ref 96–112)
Creatinine, Ser: 1.02 mg/dL (ref 0.40–1.50)
GFR: 90.35 mL/min (ref >60.00)
Glucose, Bld: 95 mg/dL (ref 70–99)
Potassium: 4.2 mEq/L (ref 3.5–5.1)
Sodium: 136 mEq/L (ref 135–145)
Total Bilirubin: 1.2 mg/dL (ref 0.2–1.2)
Total Protein: 7.2 g/dL (ref 6.0–8.3)

## 2020-04-02 LAB — CBC WITH DIFFERENTIAL/PLATELET
Basophils Absolute: 0 10*3/uL (ref 0.0–0.1)
Basophils Relative: 0.4 % (ref 0.0–3.0)
Eosinophils Absolute: 0.1 10*3/uL (ref 0.0–0.7)
Eosinophils Relative: 1.2 % (ref 0.0–5.0)
HCT: 42.5 % (ref 39.0–52.0)
Hemoglobin: 14.3 g/dL (ref 13.0–17.0)
Lymphocytes Relative: 34.1 % (ref 12.0–46.0)
Lymphs Abs: 2.5 10*3/uL (ref 0.7–4.0)
MCHC: 33.6 g/dL (ref 30.0–36.0)
MCV: 97.7 fl (ref 78.0–100.0)
Monocytes Absolute: 0.9 10*3/uL (ref 0.1–1.0)
Monocytes Relative: 12.3 % — ABNORMAL HIGH (ref 3.0–12.0)
Neutro Abs: 3.8 10*3/uL (ref 1.4–7.7)
Neutrophils Relative %: 52 % (ref 43.0–77.0)
Platelets: 289 10*3/uL (ref 150.0–400.0)
RBC: 4.35 Mil/uL (ref 4.22–5.81)
RDW: 13.1 % (ref 11.5–15.5)
WBC: 7.4 10*3/uL (ref 4.0–10.5)

## 2020-04-02 LAB — LIPID PANEL
Cholesterol: 144 mg/dL (ref 0–200)
HDL: 45.2 mg/dL (ref >39.00)
LDL Cholesterol: 70 mg/dL (ref 0–99)
NonHDL: 98.65
Total CHOL/HDL Ratio: 3
Triglycerides: 145 mg/dL (ref 0.0–149.0)
VLDL: 29 mg/dL (ref 0.0–40.0)

## 2020-04-02 LAB — PSA: PSA: 0.87 ng/mL (ref 0.10–4.00)

## 2020-04-02 LAB — HEMOGLOBIN A1C: Hgb A1c MFr Bld: 6.4 % (ref 4.6–6.5)

## 2020-04-02 LAB — TSH: TSH: 0.57 u[IU]/mL (ref 0.35–4.50)

## 2020-04-02 MED ORDER — OLMESARTAN MEDOXOMIL-HCTZ 40-25 MG PO TABS: 1.0000 | ORAL_TABLET | Freq: Every day | ORAL | 3 refills | Status: DC

## 2020-04-02 NOTE — Assessment & Plan Note (Signed)
PSA with labs today P uncle had prostate cancer in his 5s occ nocturia-no change from baseline urinary habits

## 2020-04-02 NOTE — Assessment & Plan Note (Signed)
Due for labs  Diet controlled Disc goals for lipids and reasons to control them Rev last labs with pt Rev low sat fat diet in detail

## 2020-04-02 NOTE — Patient Instructions (Addendum)
Get your flu shot in the fall   If you are interested in the new shingles vaccine (Shingrix) - call your local pharmacy to check on coverage and availability  If affordable, get on a wait list at your pharmacy to get the vaccine.  I think your colonoscopy will be due in 3/22  Let us know if you don't get a reminder   If interested- let's get a testosterone level later on (can't do today)  Follow up in about 2 weeks - we will review labs/get testosterone level and discuss mood   [facility] through [facility] may be a good choice for you   Labs today

## 2020-04-02 NOTE — Assessment & Plan Note (Signed)
Reviewed health habits including diet and exercise and skin cancer prevention Reviewed appropriate screening tests for age  Also reviewed health mt list, fam hx and immunization status , as well as social and family history   See HPI Labs done today  Interested in shingrix  covid immunized  Plans flu shot in the fall  Colonoscopy due 3/22 -[facility]  Will f/u to disc mood/sleep problems/ fatigue and dec libido

## 2020-04-02 NOTE — Assessment & Plan Note (Signed)
Discussed how this problem influences overall health and the risks it imposes  Reviewed plan for weight loss with lower calorie diet (via better food choices and also portion control or program like weight watchers) and exercise building up to or more than 30 minutes 5 days per week including some aerobic activity   Pt unsure if motivated for change  ? Depression as well  Will f/u to discuss further

## 2020-04-02 NOTE — Assessment & Plan Note (Signed)
Due for labs  pt has no been ready to work on weight loss yet  Will discuss further at f/u  A1C today

## 2020-04-02 NOTE — Assessment & Plan Note (Signed)
bp in fair control at this time  BP Readings from Last 1 Encounters:  04/02/20 128/70   No changes needed Most recent labs reviewed  Disc lifstyle change with low sodium diet and exercise

## 2020-04-02 NOTE — Progress Notes (Signed)
Subjective:    Patient ID: Sean Lee, male    DOB: 1961-04-17, 59 y.o.   MRN: [id number]  This visit occurred during the SARS-CoV-2 public health emergency.  Safety protocols were in place, including screening questions prior to the visit, additional usage of staff PPE, and extensive cleaning of exam room while observing appropriate contact time as indicated for disinfecting solutions.    HPI Here for health maintenance exam and to review chronic medical problems   Wt Readings from Last 3 Encounters:  04/02/20 277 lb 2 oz (125.7 kg)  10/17/19 280 lb (127 kg)  07/25/19 281 lb 8 oz (127.7 kg)   38.38 kg/m   Doing ok overall   covid immunized -in march  Flu shot-gets in the fall  Tdap 3/18  Zoster status - has not had been vaccinated -interested  pna vaccine in 2014  Colonoscopy 3/17 5 y recall   Prostate health  Lab Results  Component Value Date   PSA 0.82 11/03/2018   PSA 0.78 07/22/2017   PSA 1.2 04/09/2004  uncle has prostate cancer (paternal) - in his early 79s  No urinary changes in himself  occ nocturia -not every night  Trouble falling asleep   Goes to bed around 6-7 pm  - falls asleep about 2 hours later  Gets up 2-2:30 for work  He usually needs 6 hours of sleep a night  Does not nap on a daily basis   Not much exercise  No caffeine  Last food is around 3 pm   Gets 3 days off per week -he could fit in exercise those days  Has a treadmill  When weather is better-bikes outside   Monday/friday- could in afternoons   He will always have to work nights  He is trying to change jobs   Due for labs today   HTN  bp is stable today  No cp or palpitations or headaches or edema  No side effects to medicines  BP Readings from Last 3 Encounters:  04/02/20 (!) 142/80  10/17/19 134/74  07/25/19 124/82     Not eating as well due to covid  Not eating out  Has a tough time sleeping  Uses cpap machine every night   Eating at home- is fair  Eating  less sugar  No big difference in fat    Also he is loosing libido    Pulse Readings from Last 3 Encounters:  04/02/20 83  10/17/19 67  07/25/19 84   Hyperlipidemia Lab Results  Component Value Date   CHOL 147 11/03/2018   HDL 38.90 (L) 11/03/2018   LDLCALC 91 11/03/2018   TRIG 85.0 11/03/2018   CHOLHDL 4 11/03/2018   Due for labs   Prediabetes Lab Results  Component Value Date   HGBA1C 6.3 11/03/2018   Due for labs   Takes asa 325 mg   Some positives on the depression screen/ may be depressed  Patient Active Problem List   Diagnosis Date Noted  . Insomnia 04/02/2020  . Right ankle swelling 07/25/2019  . Gout 11/03/2018  . Chronic pain of right ankle 08/31/2018  . Routine general medical examination at a health care facility 11/01/2017  . Prostate cancer screening 06/28/2017  . Alopecia 11/04/2016  . Back pain 05/06/2016  . Sleep disorder 08/28/2015  . Colon cancer screening 08/28/2015  . Prediabetes 01/02/2013  . Varicosities of leg 03/21/2012  . Rectus diastasis of lower abdomen 03/21/2012  . Obstructive sleep apnea 10/15/2009  .  Class 2 severe obesity with serious comorbidity and body mass index (BMI) of 38.0 to 38.9 in adult ([place]) 09/02/2009  . Hyperlipidemia 02/02/2007  . HEMATURIA, MICROSCOPIC 02/02/2007  . Essential hypertension 02/01/2007  . CEREBROVASCULAR ACCIDENT, HX OF 02/01/2007   Past Medical History:  Diagnosis Date  . Contact dermatitis and other eczema due to plants (except food)   . H/O: CVA (cardiovascular accident)    Carotid doppler neg (4/08)  . Hematuria   . Obesity, unspecified   . Other and unspecified hyperlipidemia    Hx - no medication/diet controll and weight loss  . Sacroiliitis, not elsewhere classified ([place])   . Sleep apnea    uses CPAP but not every night  . Unspecified essential hypertension    Past Surgical History:  Procedure Laterality Date  . Carotid dopplers  4/08   Negative  . HERNIA REPAIR     umbilical    Social History   Tobacco Use  . Smoking status: Never Smoker  . Smokeless tobacco: Never Used  Substance Use Topics  . Alcohol use: Yes    Alcohol/week: 4.0 standard drinks    Types: 4 Cans of beer per week    Comment: weekends  . Drug use: No   Family History  Problem Relation Age of Onset  . Hypertension Mother   . Diabetes Mother   . Hyperlipidemia Unknown        in family   . Cancer Neg Hx   . Esophageal cancer Neg Hx   . Rectal cancer Neg Hx   . Stomach cancer Neg Hx   . Colon cancer Neg Hx    Allergies  Allergen Reactions  . Benazepril Hcl     REACTION: cough   Current Outpatient Medications on File Prior to Visit  Medication Sig Dispense Refill  . aspirin 325 MG tablet Take 325 mg by mouth daily.      Marland Kitchen latanoprost (XALATAN) 0.005 % ophthalmic solution Place 1 drop into both eyes at bedtime.    . meloxicam (MOBIC) 15 MG tablet Take 15 mg by mouth daily.     No current facility-administered medications on file prior to visit.    Review of Systems  Constitutional: Positive for fatigue. Negative for activity change, appetite change, fever and unexpected weight change.  HENT: Negative for congestion, rhinorrhea, sore throat and trouble swallowing.   Eyes: Negative for pain, redness, itching and visual disturbance.  Respiratory: Negative for cough, chest tightness, shortness of breath and wheezing.   Cardiovascular: Negative for chest pain and palpitations.  Gastrointestinal: Negative for abdominal pain, blood in stool, constipation, diarrhea and nausea.  Endocrine: Negative for cold intolerance, heat intolerance, polydipsia and polyuria.  Genitourinary: Negative for difficulty urinating, dysuria, frequency and urgency.       Decreased libido  Musculoskeletal: Negative for arthralgias, joint swelling and myalgias.  Skin: Negative for pallor and rash.  Neurological: Negative for dizziness, tremors, weakness, numbness and headaches.  Hematological: Negative for  adenopathy. Does not bruise/bleed easily.  Psychiatric/Behavioral: Positive for dysphoric mood and sleep disturbance. Negative for decreased concentration. The patient is not nervous/anxious.        Objective:   Physical Exam Constitutional:      General: He is not in acute distress.    Appearance: Normal appearance. He is well-developed. He is obese. He is not ill-appearing or diaphoretic.  HENT:     Head: Normocephalic and atraumatic.     Right Ear: Tympanic membrane, ear canal and external ear normal.  Left Ear: Tympanic membrane, ear canal and external ear normal.     Nose: Nose normal. No congestion.     Mouth/Throat:     Mouth: Mucous membranes are moist.     Pharynx: Oropharynx is clear. No posterior oropharyngeal erythema.  Eyes:     General: No scleral icterus.       Right eye: No discharge.        Left eye: No discharge.     Conjunctiva/sclera: Conjunctivae normal.     Pupils: Pupils are equal, round, and reactive to light.  Neck:     Thyroid: No thyromegaly.     Vascular: No carotid bruit or JVD.  Cardiovascular:     Rate and Rhythm: Normal rate and regular rhythm.     Pulses: Normal pulses.     Heart sounds: Normal heart sounds. No gallop.   Pulmonary:     Effort: Pulmonary effort is normal. No respiratory distress.     Breath sounds: Normal breath sounds. No wheezing or rales.     Comments: Good air exch Chest:     Chest wall: No tenderness.  Abdominal:     General: Bowel sounds are normal. There is no distension or abdominal bruit.     Palpations: Abdomen is soft. There is no mass.     Tenderness: There is no abdominal tenderness.     Hernia: No hernia is present.  Musculoskeletal:        General: No tenderness.     Cervical back: Normal range of motion and neck supple. No rigidity. No muscular tenderness.     Right lower leg: No edema.     Left lower leg: No edema.  Lymphadenopathy:     Cervical: No cervical adenopathy.  Skin:    General: Skin is  warm and dry.     Coloration: Skin is not pale.     Findings: No erythema or rash.     Comments: Few skin tags  Neurological:     Mental Status: He is alert.     Cranial Nerves: No cranial nerve deficit.     Sensory: No sensory deficit.     Motor: No abnormal muscle tone.     Coordination: Coordination normal.     Gait: Gait normal.     Deep Tendon Reflexes: Reflexes are normal and symmetric. Reflexes normal.  Psychiatric:        Attention and Perception: Attention normal.        Mood and Affect: Mood is depressed.        Behavior: Behavior normal.        Thought Content: Thought content normal.        Cognition and Memory: Cognition and memory normal.     Comments: Seems generally down           Assessment & Plan:   Problem List Items Addressed This Visit      Cardiovascular and Mediastinum   Essential hypertension    bp in fair control at this time  BP Readings from Last 1 Encounters:  04/02/20 128/70   No changes needed Most recent labs reviewed  Disc lifstyle change with low sodium diet and exercise        Relevant Medications   olmesartan-hydrochlorothiazide (BENICAR HCT) 40-25 MG tablet   Other Relevant Orders   CBC with Differential/Platelet (Completed)   Comprehensive metabolic panel (Completed)   Lipid panel (Completed)   TSH (Completed)     Other   Hyperlipidemia  Due for labs  Diet controlled Disc goals for lipids and reasons to control them Rev last labs with pt Rev low sat fat diet in detail        Relevant Medications   olmesartan-hydrochlorothiazide (BENICAR HCT) 40-25 MG tablet   Other Relevant Orders   Lipid panel (Completed)   Class 2 severe obesity with serious comorbidity and body mass index (BMI) of 38.0 to 38.9 in adult [facility])    Discussed how this problem influences overall health and the risks it imposes  Reviewed plan for weight loss with lower calorie diet (via better food choices and also portion control or program like  weight watchers) and exercise building up to or more than 30 minutes 5 days per week including some aerobic activity   Pt unsure if motivated for change  ? Depression as well  Will f/u to discuss further       Prediabetes    Due for labs  pt has no been ready to work on weight loss yet  Will discuss further at f/u  A1C today      Relevant Orders   Hemoglobin A1c (Completed)   Prostate cancer screening    PSA with labs today P uncle had prostate cancer in his 42s occ nocturia-no change from baseline urinary habits      Relevant Orders   PSA (Completed)   Routine general medical examination at a health care facility - Primary    Reviewed health habits including diet and exercise and skin cancer prevention Reviewed appropriate screening tests for age  Also reviewed health mt list, fam hx and immunization status , as well as social and family history   See HPI Labs done today  Interested in shingrix  covid immunized  Plans flu shot in the fall  [facility]onoscopy due 3/22 -[facility]  Will f/u to disc mood/sleep problems/ fatigue and dec libido       Insomnia    Given info on sleep hygeine  ? If possibly depression  Will disc at f/u

## 2020-04-02 NOTE — Assessment & Plan Note (Signed)
Given info on sleep hygeine  ? If possibly depression  Will disc at f/u

## 2020-04-03 ENCOUNTER — Encounter: Payer: Self-pay | Admitting: *Deleted

## 2020-04-10 DIAGNOSIS — G4733 Obstructive sleep apnea (adult) (pediatric): Secondary | ICD-10-CM | POA: Diagnosis not present

## 2020-04-15 ENCOUNTER — Other Ambulatory Visit: Payer: Self-pay | Admitting: Family Medicine

## 2020-04-16 ENCOUNTER — Ambulatory Visit: Payer: BC Managed Care – PPO | Admitting: Family Medicine

## 2020-04-16 DIAGNOSIS — Z0289 Encounter for other administrative examinations: Secondary | ICD-10-CM

## 2020-05-10 DIAGNOSIS — G4733 Obstructive sleep apnea (adult) (pediatric): Secondary | ICD-10-CM | POA: Diagnosis not present

## 2020-06-10 DIAGNOSIS — G4733 Obstructive sleep apnea (adult) (pediatric): Secondary | ICD-10-CM | POA: Diagnosis not present

## 2020-06-18 NOTE — Telephone Encounter (Signed)
Pt had appt with Dr Glori Bickers on 04/02/20.

## 2020-07-10 DIAGNOSIS — G4733 Obstructive sleep apnea (adult) (pediatric): Secondary | ICD-10-CM | POA: Diagnosis not present

## 2020-10-22 ENCOUNTER — Ambulatory Visit: Payer: BC Managed Care – PPO | Admitting: Internal Medicine

## 2020-11-18 NOTE — Progress Notes (Deleted)
HPI male never smoker followed for OSA, complicated by HBP, obesity, history CVA, gout, Unattended home sleep study 02/11/2013-mild obstructive apnea, AHI 9.7 per hour with snoring, desaturation to 79%, weight 286 pounds  ------------------------------------------------------------------------------    10/17/19- 60 year old male never smoker followed for OSA, Insomnia, complicated by HBP, obesity, history CVA, gout, CPAP auto 5-20/ [employer] Download compliance 67%, AHI 0.8/ hr Body weight today 280 lbs -----f/u OSA. Breathing is at patient's baseline. Had DOT physical Likes current mask. "Forgets to put it on". Discussed compliance goals, importance to DOT. Pressure is ok. No interval medical events or concerns reported.   11/19/20- 60 year old male never smoker followed for OSA, Insomnia, complicated by HBP, obesity, history CVA, gout, CPAP auto 5-20/ [employer] Download- Body weight today- Covid vax- Flu vax-   ROS-see HPI   + = positive Constitutional:   No-   weight loss, night sweats, fevers, chills, fatigue, lassitude. HEENT:   No-  headaches, difficulty swallowing, tooth/dental problems, sore throat,       No-  sneezing, itching, ear ache, nasal congestion, post nasal drip,  CV:  No-   chest pain, orthopnea, PND, swelling in lower extremities, anasarca,  dizziness, palpitations Resp: No-   shortness of breath with exertion or at rest.              No-   productive cough,  No non-productive cough,  No- coughing up of blood.              No-   change in color of mucus.  No- wheezing.   Skin: No-   rash or lesions. GI:  No-   heartburn, indigestion, abdominal pain, nausea, vomiting, GU: . MS:  No-   joint pain or swelling.   Neuro-     nothing unusual Psych:  No- change in mood or affect. No depression or anxiety.  No memory loss.  OBJ- Physical Exam General- Alert, Oriented, Affect-appropriate, Distress- none acute, + overweight, alert Skin- rash-none, lesions- none,  excoriation- none Lymphadenopathy- none Head- atraumatic            Eyes- Gross vision intact, PERRLA, conjunctivae and secretions clear            Ears- Hearing, canals-normal            Nose- Clear, no-Septal dev, mucus, polyps, erosion, perforation             Throat- Mallampati III-IV , mucosa clear , drainage- none, tonsils- atrophic, + own teeth Neck- flexible , trachea midline, no stridor , thyroid nl, carotid no bruit Chest - symmetrical excursion , unlabored           Heart/CV- RRR , no murmur , no gallop  , no rub, nl s1 s2                           - JVD- none , edema- none, stasis changes- none, varices- none           Lung- clear to P&A, wheeze- none, cough- none , dullness-none, rub- none           Chest wall-  Abd-  Br/ Gen/ Rectal- Not done, not indicated Extrem- cyanosis- none, clubbing, none, atrophy- none, strength- nl Neuro- grossly intact to observation

## 2020-11-19 ENCOUNTER — Ambulatory Visit: Payer: Self-pay | Admitting: Internal Medicine

## 2020-12-09 ENCOUNTER — Other Ambulatory Visit: Payer: Self-pay | Admitting: Family Medicine

## 2020-12-28 ENCOUNTER — Other Ambulatory Visit: Payer: Self-pay | Admitting: Family Medicine

## 2020-12-31 ENCOUNTER — Encounter: Payer: Self-pay | Admitting: Family Medicine

## 2020-12-31 ENCOUNTER — Ambulatory Visit (INDEPENDENT_AMBULATORY_CARE_PROVIDER_SITE_OTHER): Payer: 59 | Admitting: Family Medicine

## 2020-12-31 ENCOUNTER — Other Ambulatory Visit: Payer: Self-pay

## 2020-12-31 VITALS — BP 120/80 | HR 80 | Temp 98.5°F | Ht 71.25 in | Wt 264.5 lb

## 2020-12-31 DIAGNOSIS — I10 Essential (primary) hypertension: Secondary | ICD-10-CM | POA: Diagnosis not present

## 2020-12-31 DIAGNOSIS — E6609 Other obesity due to excess calories: Secondary | ICD-10-CM | POA: Insufficient documentation

## 2020-12-31 DIAGNOSIS — Z6836 Body mass index (BMI) 36.0-36.9, adult: Secondary | ICD-10-CM

## 2020-12-31 DIAGNOSIS — N529 Male erectile dysfunction, unspecified: Secondary | ICD-10-CM

## 2020-12-31 MED ORDER — SILDENAFIL CITRATE 50 MG PO TABS
50.0000 mg | ORAL_TABLET | Freq: Every day | ORAL | 3 refills | Status: DC | PRN
Start: 2020-12-31 — End: 2022-01-08

## 2020-12-31 MED ORDER — OLMESARTAN MEDOXOMIL-HCTZ 40-25 MG PO TABS: 1.0000 | ORAL_TABLET | Freq: Every day | ORAL | 3 refills | Status: DC

## 2020-12-31 NOTE — Patient Instructions (Addendum)
If you are interested in the shingles vaccine series (Shingrix), call your insurance or pharmacy to check on coverage and location it must be given.  If affordable - you can schedule it here or at your pharmacy depending on coverage   Keep working on healthy diet and exercise for weight loss   Try the generic viagra  If any side effects like vision change or chest discomfort stop it and let me know   Take care of yourself   I will place a referral to urology  You will get a call

## 2020-12-31 NOTE — Assessment & Plan Note (Signed)
bp in fair control at this time  BP Readings from Last 1 Encounters:  12/31/20 120/80   No changes needed Most recent labs reviewed  Disc lifstyle change with low sodium diet and exercise  Plan to continue losartan hct 40-25  Pt has been on this since before ED started

## 2020-12-31 NOTE — Assessment & Plan Note (Signed)
Symptoms for a year but worse in past 6 months Does not correlate with any health or medicine changes  No new stressors  Lifestyle habits are improved with intentional wt loss  No change in libido bp is well controlled and lipid panel is good  Disc trial of sildenafil 50 mg before sexual activity  inst to call if any side eff like cp or vision change or dizziness Ref made to urology for further eval

## 2020-12-31 NOTE — Assessment & Plan Note (Signed)
Discussed how this problem influences overall health and the risks it imposes  Reviewed plan for weight loss with lower calorie diet (via better food choices and also portion control or program like weight watchers) and exercise building up to or more than 30 minutes 5 days per week including some aerobic activity   Commended effort and great job with wt loss so far

## 2020-12-31 NOTE — Progress Notes (Signed)
Subjective:    Patient ID: Sean Lee, male    DOB: 1960-10-03, 60 y.o.   MRN: [id number]  This visit occurred during the SARS-CoV-2 public health emergency.  Safety protocols were in place, including screening questions prior to the visit, additional usage of staff PPE, and extensive cleaning of exam room while observing appropriate contact time as indicated for disinfecting solutions.    HPI Pt presents to request a referral to urology  Wt Readings from Last 3 Encounters:  12/31/20 264 lb 8 oz (120 kg)  04/02/20 277 lb 2 oz (125.7 kg)  10/17/19 280 lb (127 kg)   36.63 kg/m  Has lost wt  Cut back on eating  Walking about an hour per day (uses an outdoor track)   He is having some varicose veins treated on L leg  It is going pretty well  Usually wears a support stocking    Having some more erection problems  Would like to see urology   Unable to get an erection for about 6 months  More minor symptoms for about a year   He still has a drive  No medicine changes   bp is stable today  No cp or palpitations or headaches or edema  No side effects to medicines  BP Readings from Last 3 Encounters:  12/31/20 120/80  04/02/20 128/70  10/17/19 134/74    Pulse Readings from Last 3 Encounters:  12/31/20 80  04/02/20 83  10/17/19 67   bp is up and down at home -not high  Took med at 5 am   Losartan hct 40-25 mg    No new medicines otc at all   No cardiac problems in the past   Has never tried viagra or other similar medicines   Mood is good - but some frustration from the ED   No h/o STDs in the past   No prostate c/o Lab Results  Component Value Date   PSA 0.87 04/02/2020   PSA 0.82 11/03/2018   PSA 0.78 07/22/2017   Lipids Lab Results  Component Value Date   CHOL 144 04/02/2020   HDL 45.20 04/02/2020   LDLCALC 70 04/02/2020   TRIG 145.0 04/02/2020   CHOLHDL 3 04/02/2020   Patient Active Problem List   Diagnosis Date Noted  . Class 2  obesity due to excess calories with body mass index (BMI) of 36.0 to 36.9 in adult 12/31/2020  . ED (erectile dysfunction) 12/31/2020  . Insomnia 04/02/2020  . Right ankle swelling 07/25/2019  . Gout 11/03/2018  . Chronic pain of right ankle 08/31/2018  . Routine general medical examination at a health care facility 11/01/2017  . Prostate cancer screening 06/28/2017  . Alopecia 11/04/2016  . Back pain 05/06/2016  . Sleep disorder 08/28/2015  . Colon cancer screening 08/28/2015  . Prediabetes 01/02/2013  . Varicosities of leg 03/21/2012  . Rectus diastasis of lower abdomen 03/21/2012  . Obstructive sleep apnea 10/15/2009  . Hyperlipidemia 02/02/2007  . HEMATURIA, MICROSCOPIC 02/02/2007  . Essential hypertension 02/01/2007  . CEREBROVASCULAR ACCIDENT, HX OF 02/01/2007   Past Medical History:  Diagnosis Date  . Contact dermatitis and other eczema due to plants (except food)   . H/O: CVA (cardiovascular accident)    Carotid doppler neg (4/08)  . Hematuria   . Obesity, unspecified   . Other and unspecified hyperlipidemia    Hx - no medication/diet controll and weight loss  . Sacroiliitis, not elsewhere classified ([place])   . Sleep apnea  uses CPAP but not every night  . Unspecified essential hypertension    Past Surgical History:  Procedure Laterality Date  . Carotid dopplers  4/08   Negative  . HERNIA REPAIR     umbilical   Social History   Tobacco Use  . Smoking status: Never Smoker  . Smokeless tobacco: Never Used  Substance Use Topics  . Alcohol use: Yes    Alcohol/week: 4.0 standard drinks    Types: 4 Cans of beer per week    Comment: weekends  . Drug use: No   Family History  Problem Relation Age of Onset  . Hypertension Mother   . Diabetes Mother   . Hyperlipidemia Unknown        in family   . Cancer Neg Hx   . Esophageal cancer Neg Hx   . Rectal cancer Neg Hx   . Stomach cancer Neg Hx   . Colon cancer Neg Hx    Allergies  Allergen Reactions  .  Benazepril Hcl     REACTION: cough   Current Outpatient Medications on File Prior to Visit  Medication Sig Dispense Refill  . aspirin 325 MG tablet Take 325 mg by mouth daily.    Marland Kitchen latanoprost (XALATAN) 0.005 % ophthalmic solution Place 1 drop into both eyes at bedtime.     No current facility-administered medications on file prior to visit.      Review of Systems  Constitutional: Negative for activity change, appetite change, fatigue, fever and unexpected weight change.  HENT: Negative for congestion, rhinorrhea, sore throat and trouble swallowing.   Eyes: Negative for pain, redness, itching and visual disturbance.  Respiratory: Negative for cough, chest tightness, shortness of breath and wheezing.   Cardiovascular: Negative for chest pain and palpitations.  Gastrointestinal: Negative for abdominal pain, blood in stool, constipation, diarrhea and nausea.  Endocrine: Negative for cold intolerance, heat intolerance, polydipsia and polyuria.  Genitourinary: Negative for difficulty urinating, dysuria, frequency, genital sores, penile discharge, penile pain, penile swelling, scrotal swelling, testicular pain and urgency.       Erectile dysfunction   Musculoskeletal: Negative for arthralgias, joint swelling and myalgias.  Skin: Negative for pallor and rash.  Neurological: Negative for dizziness, tremors, weakness, numbness and headaches.  Hematological: Negative for adenopathy. Does not bruise/bleed easily.  Psychiatric/Behavioral: Negative for decreased concentration and dysphoric mood. The patient is not nervous/anxious.        Objective:   Physical Exam Constitutional:      General: He is not in acute distress.    Appearance: Normal appearance. He is well-developed. He is obese. He is not ill-appearing or diaphoretic.  HENT:     Head: Normocephalic and atraumatic.  Eyes:     Conjunctiva/sclera: Conjunctivae normal.     Pupils: Pupils are equal, round, and reactive to light.   Neck:     Thyroid: No thyromegaly.     Vascular: No carotid bruit or JVD.  Cardiovascular:     Rate and Rhythm: Normal rate and regular rhythm.     Heart sounds: Normal heart sounds. No gallop.   Pulmonary:     Effort: Pulmonary effort is normal. No respiratory distress.     Breath sounds: Normal breath sounds. No wheezing or rales.  Abdominal:     General: Bowel sounds are normal. There is no distension or abdominal bruit.     Palpations: Abdomen is soft. There is no mass.     Tenderness: There is no abdominal tenderness.  Musculoskeletal:  Cervical back: Normal range of motion and neck supple.     Right lower leg: No edema.     Left lower leg: No edema.     Comments: LE varicosities are compressible  Worse in LLE  Some scars from recent proceedures  Lymphadenopathy:     Cervical: No cervical adenopathy.  Skin:    General: Skin is warm and dry.     Findings: No rash.  Neurological:     Mental Status: He is alert.     Coordination: Coordination normal.     Deep Tendon Reflexes: Reflexes are normal and symmetric. Reflexes normal.  Psychiatric:        Mood and Affect: Mood normal.           Assessment & Plan:   Problem List Items Addressed This Visit      Cardiovascular and Mediastinum   Essential hypertension    bp in fair control at this time  BP Readings from Last 1 Encounters:  12/31/20 120/80   No changes needed Most recent labs reviewed  Disc lifstyle change with low sodium diet and exercise  Plan to continue losartan hct 40-25  Pt has been on this since before ED started       Relevant Medications   sildenafil (VIAGRA) 50 MG tablet   olmesartan-hydrochlorothiazide (BENICAR HCT) 40-25 MG tablet     Other   Class 2 obesity due to excess calories with body mass index (BMI) of 36.0 to 36.9 in adult    Discussed how this problem influences overall health and the risks it imposes  Reviewed plan for weight loss with lower calorie diet (via better food  choices and also portion control or program like weight watchers) and exercise building up to or more than 30 minutes 5 days per week including some aerobic activity   Commended effort and great job with wt loss so far       ED (erectile dysfunction) - Primary    Symptoms for a year but worse in past 6 months Does not correlate with any health or medicine changes  No new stressors  Lifestyle habits are improved with intentional wt loss  No change in libido bp is well controlled and lipid panel is good  Disc trial of sildenafil 50 mg before sexual activity  inst to call if any side eff like cp or vision change or dizziness Ref made to urology for further eval       Relevant Orders   Ambulatory referral to Urology

## 2021-01-01 ENCOUNTER — Other Ambulatory Visit: Payer: Self-pay | Admitting: Family Medicine

## 2021-01-19 ENCOUNTER — Other Ambulatory Visit: Payer: Self-pay

## 2021-01-19 ENCOUNTER — Emergency Department (HOSPITAL_COMMUNITY)
Admission: EM | Admit: 2021-01-19 | Discharge: 2021-01-19 | Disposition: A | Discharge status: Home / Self Care | Payer: 59 | Attending: Emergency Medicine | Admitting: Emergency Medicine

## 2021-01-19 ENCOUNTER — Emergency Department (HOSPITAL_COMMUNITY): Payer: 59

## 2021-01-19 ENCOUNTER — Encounter (HOSPITAL_COMMUNITY): Payer: Self-pay | Admitting: Emergency Medicine

## 2021-01-19 DIAGNOSIS — Z79899 Other long term (current) drug therapy: Secondary | ICD-10-CM | POA: Diagnosis not present

## 2021-01-19 DIAGNOSIS — Z7982 Long term (current) use of aspirin: Secondary | ICD-10-CM | POA: Diagnosis not present

## 2021-01-19 DIAGNOSIS — U071 COVID-19: Secondary | ICD-10-CM | POA: Diagnosis not present

## 2021-01-19 DIAGNOSIS — I10 Essential (primary) hypertension: Secondary | ICD-10-CM | POA: Diagnosis not present

## 2021-01-19 DIAGNOSIS — J111 Influenza due to unidentified influenza virus with other respiratory manifestations: Secondary | ICD-10-CM

## 2021-01-19 DIAGNOSIS — R509 Fever, unspecified: Secondary | ICD-10-CM | POA: Diagnosis present

## 2021-01-19 LAB — CBC WITH DIFFERENTIAL/PLATELET
Abs Immature Granulocytes: 0.02 10*3/uL (ref 0.00–0.07)
Basophils Absolute: 0 10*3/uL (ref 0.0–0.1)
Basophils Relative: 1 %
Eosinophils Absolute: 0 10*3/uL (ref 0.0–0.5)
Eosinophils Relative: 0 %
HCT: 43.7 % (ref 39.0–52.0)
Hemoglobin: 14.9 g/dL (ref 13.0–17.0)
Immature Granulocytes: 0 %
Lymphocytes Relative: 19 %
Lymphs Abs: 1.2 10*3/uL (ref 0.7–4.0)
MCH: 33.1 pg (ref 26.0–34.0)
MCHC: 34.1 g/dL (ref 30.0–36.0)
MCV: 97.1 fL (ref 80.0–100.0)
Monocytes Absolute: 1.3 10*3/uL — ABNORMAL HIGH (ref 0.1–1.0)
Monocytes Relative: 21 %
Neutro Abs: 3.7 10*3/uL (ref 1.7–7.7)
Neutrophils Relative %: 59 %
Platelets: 244 10*3/uL (ref 150–400)
RBC: 4.5 MIL/uL (ref 4.22–5.81)
RDW: 12.2 % (ref 11.5–15.5)
WBC: 6.3 10*3/uL (ref 4.0–10.5)
nRBC: 0 % (ref 0.0–0.2)

## 2021-01-19 LAB — BASIC METABOLIC PANEL
Anion gap: 9 (ref 5–15)
BUN: 15 mg/dL (ref 6–20)
CO2: 28 mmol/L (ref 22–32)
Calcium: 9.5 mg/dL (ref 8.9–10.3)
Chloride: 96 mmol/L — ABNORMAL LOW (ref 98–111)
Creatinine, Ser: 1.25 mg/dL — ABNORMAL HIGH (ref 0.61–1.24)
GFR, Estimated: >60 mL/min (ref >60)
Glucose, Bld: 106 mg/dL — ABNORMAL HIGH (ref 70–99)
Potassium: 3.5 mmol/L (ref 3.5–5.1)
Sodium: 133 mmol/L — ABNORMAL LOW (ref 135–145)

## 2021-01-19 LAB — RESP PANEL BY RT-PCR (FLU A&B, COVID) ARPGX2
Influenza A by PCR: NEGATIVE
Influenza B by PCR: NEGATIVE
SARS Coronavirus 2 by RT PCR: POSITIVE — AB

## 2021-01-19 MED ORDER — DOXYCYCLINE HYCLATE 100 MG PO CAPS: 100.0000 mg | ORAL_CAPSULE | Freq: Two times a day (BID) | ORAL | 0 refills | Status: DC

## 2021-01-19 MED ORDER — IBUPROFEN 800 MG PO TABS
800.0000 mg | ORAL_TABLET | Freq: Once | ORAL | Status: AC
  Administered 2021-01-19: 800 mg via ORAL
  Filled 2021-01-19: qty 1

## 2021-01-19 NOTE — ED Provider Notes (Signed)
[facility] DEPT Provider Note   CSN: [id number] Arrival date & time: 01/19/21  0855     History Chief Complaint  Patient presents with  . Fever    AMADU SCHLAGETER is a 60 y.o. male who presents emergency department with a chief complaint of flulike symptoms.  Symptoms began 2 days ago.  He has associated cough, body aches, chills, decreased appetite and oral intake.  He has had 2 COVID vaccines but has not been boosted.  No known contacts with similar symptoms.  No recent foreign travel.  No rashes, no known tick bites.  HPI     Past Medical History:  Diagnosis Date  . Contact dermatitis and other eczema due to plants (except food)   . H/O: CVA (cardiovascular accident)    Carotid doppler neg (4/08)  . Hematuria   . Obesity, unspecified   . Other and unspecified hyperlipidemia    Hx - no medication/diet controll and weight loss  . Sacroiliitis, not elsewhere classified ([place])   . Sleep apnea    uses CPAP but not every night  . Unspecified essential hypertension     Patient Active Problem List   Diagnosis Date Noted  . Class 2 obesity due to excess calories with body mass index (BMI) of 36.0 to 36.9 in adult 12/31/2020  . ED (erectile dysfunction) 12/31/2020  . Insomnia 04/02/2020  . Right ankle swelling 07/25/2019  . Gout 11/03/2018  . Chronic pain of right ankle 08/31/2018  . Routine general medical examination at a health care facility 11/01/2017  . Prostate cancer screening 06/28/2017  . Alopecia 11/04/2016  . Back pain 05/06/2016  . Sleep disorder 08/28/2015  . Colon cancer screening 08/28/2015  . Prediabetes 01/02/2013  . Varicosities of leg 03/21/2012  . Rectus diastasis of lower abdomen 03/21/2012  . Obstructive sleep apnea 10/15/2009  . Hyperlipidemia 02/02/2007  . HEMATURIA, MICROSCOPIC 02/02/2007  . Essential hypertension 02/01/2007  . CEREBROVASCULAR ACCIDENT, HX OF 02/01/2007    Past Surgical History:  Procedure  Laterality Date  . Carotid dopplers  4/08   Negative  . HERNIA REPAIR     umbilical       Family History  Problem Relation Age of Onset  . Hypertension Mother   . Diabetes Mother   . Hyperlipidemia Unknown        in family   . Cancer Neg Hx   . Esophageal cancer Neg Hx   . Rectal cancer Neg Hx   . Stomach cancer Neg Hx   . Colon cancer Neg Hx     Social History   Tobacco Use  . Smoking status: Never Smoker  . Smokeless tobacco: Never Used  Substance Use Topics  . Alcohol use: Yes    Alcohol/week: 4.0 standard drinks    Types: 4 Cans of beer per week    Comment: weekends  . Drug use: No    Home Medications Prior to Admission medications   Medication Sig Start Date End Date Taking? Authorizing Provider  aspirin 325 MG tablet Take 325 mg by mouth daily.    [provider]  latanoprost (XALATAN) 0.005 % ophthalmic solution Place 1 drop into both eyes at bedtime. 08/16/18   [provider]  olmesartan-hydrochlorothiazide (BENICAR HCT) 40-25 MG tablet Take 1 tablet by mouth daily. 12/31/20   Tower, Wynelle Fanny, MD  sildenafil (VIAGRA) 50 MG tablet Take 1 tablet (50 mg total) by mouth daily as needed for erectile dysfunction. 30 minutes before sexual activity  12/31/20   Tower, Wynelle Fanny, MD    Allergies    Benazepril hcl  Review of Systems   Review of Systems  Ten systems reviewed and are negative for acute change, except as noted in the HPI.    Physical Exam Updated Vital Signs BP (!) 140/92 (BP Location: Left Arm)   Pulse 97   Temp 100.2 F (37.9 C) (Oral)   Resp 18   SpO2 97%   Physical Exam Vitals and nursing note reviewed.  Constitutional:      General: He is not in acute distress.    Appearance: He is well-developed. He is ill-appearing. He is not toxic-appearing or diaphoretic.  HENT:     Head: Normocephalic and atraumatic.  Eyes:     General: No scleral icterus.    Conjunctiva/sclera: Conjunctivae normal.  Cardiovascular:     Rate  and Rhythm: Normal rate and regular rhythm.     Heart sounds: Normal heart sounds.  Pulmonary:     Effort: Pulmonary effort is normal. No respiratory distress.     Breath sounds: Normal breath sounds. No wheezing, rhonchi or rales.  Abdominal:     Palpations: Abdomen is soft.     Tenderness: There is no abdominal tenderness.  Musculoskeletal:     Cervical back: Normal range of motion and neck supple.  Skin:    General: Skin is warm and dry.  Neurological:     Mental Status: He is alert.  Psychiatric:        Behavior: Behavior normal.     ED Results / Procedures / Treatments   Labs (all labs ordered are listed, but only abnormal results are displayed) Labs Reviewed  BASIC METABOLIC PANEL - Abnormal; Notable for the following components:      Result Value   Sodium 133 (*)    Chloride 96 (*)    Glucose, Bld 106 (*)    Creatinine, Ser 1.25 (*)    All other components within normal limits  CBC WITH DIFFERENTIAL/PLATELET - Abnormal; Notable for the following components:   Monocytes Absolute 1.3 (*)    All other components within normal limits  RESP PANEL BY RT-PCR (FLU A&B, COVID) [id number]    EKG None  Radiology DG Chest Port 1 View  Result Date: 01/19/2021 CLINICAL DATA:  Fever and chills.  Cough. EXAM: PORTABLE CHEST 1 VIEW COMPARISON:  10/18/2005 FINDINGS: Normal mediastinum and cardiac silhouette. Normal pulmonary vasculature. No evidence of effusion, infiltrate, or pneumothorax. No acute bony abnormality. IMPRESSION: No acute cardiopulmonary process. Electronically Signed   By: Suzy Bouchard M.D.   On: 01/19/2021 12:23    Procedures Procedures   Medications Ordered in ED Medications  ibuprofen (ADVIL) tablet 800 mg (800 mg Oral Given 01/19/21 1253)    ED Course  I have reviewed the triage vital signs and the nursing notes.  Pertinent labs & imaging results that were available during my care of the patient were reviewed by me and considered in my medical decision  making (see chart for details).   60 year old male here with flulike symptoms.  Given Motrin with improvement in his symptoms.  COVID test is pending.  I reviewed labs which shows mildly elevated blood glucose, mildly elevated creatinine likely due to some dehydration and transient and have encouraged him to increase his fluid intake.  CBC without abnormality.  Patient given prescription for doxycycline to take if COVID test is negative.  He appears otherwise appropriate for discharge and he may follow-up on results outpatient.  MDM Rules/Calculators/A&P                          Final Clinical Impression(s) / ED Diagnoses Final diagnoses:  None    Rx / DC Orders ED Discharge Orders    None       Margarita Mail, PA-C 01/19/21 1539    Carmin Muskrat, MD 01/20/21 2322

## 2021-01-19 NOTE — Discharge Instructions (Addendum)
Your COVID/FLU test pending. You should be able to see the results on you MyChart app. If for some reason you are negative for COVID or FLU, I have ordered Doxycycline to cover for potential Tick born illnesses as they are a major cause of flu like symptoms in the summer time. Your creatinine is a measure of your kidney function is slightly elevated indicating that you are dehydrated and need to drink plenty of fluids when you get home.  You may follow-up with your primary care physician to have this rechecked in a couple of weeks   Get help right away if: You have shortness of breath or have trouble breathing. You are dizzy or you faint. You are disoriented or confused. You develop signs of dehydration, such as: Dark urine, very little urine, or no urine. Cracked lips. Dry mouth. Sunken eyes. Sleepiness. Weakness. You develop severe pain in your abdomen. You have persistent vomiting or diarrhea. You develop a skin rash. Your symptoms suddenly get worse.

## 2021-01-19 NOTE — ED Triage Notes (Signed)
Patient reports fevers since Friday up to 102.50F with chills and body aches with fatigue. He also reports cough. He states he has been vaccinated with Moderna but has not had the booster. He has not been tested for Covid. He denies any sick contacts. Denies any recent traveling. Reports taking Tylenol last night.

## 2021-01-20 ENCOUNTER — Telehealth (HOSPITAL_COMMUNITY): Payer: Self-pay

## 2021-04-25 ENCOUNTER — Telehealth: Payer: Self-pay

## 2021-04-25 NOTE — Telephone Encounter (Signed)
I spoke with pt; pt did not go to ED or UC this morning; pt is still having abd and back pain; pt said he wonders if has kidney infection. I advised pt he should get eval and having some testing to see why he is still hurting. Pt agreed and said he would go to [facility] ED this afternoon. Sending note to DR Glori Bickers and [place] CMA. Sending teams to [place].

## 2021-04-25 NOTE — Telephone Encounter (Signed)
Agree with that advisement and will watch for correspondence

## 2021-04-25 NOTE — Telephone Encounter (Signed)
[facility] Night - Client TELEPHONE ADVICE RECORD [facility] Patient Name: Sean Lee Gender: Male DOB: December 02, 1960 Age: 60 Y 68 M 17 D Return Phone Number: [phone] (Primary), [phone] (Secondary) Address: City/ State/ Zip: [place] [address] Client [facility] Night - Client Client Site [facility] Physician Tower, Roque Lias - MD Contact Type Call Who Is Calling Patient / Member / Family / Caregiver Call Type Triage / Clinical Relationship To Patient Self Return Phone Number [phone] (Primary) Chief Complaint Abdominal Pain Reason for Call Symptomatic / Request for [place] states he is in a lot of pain and can hardly get out of bed. He states the pain is around his back and his belly. When he tries to raise up it gets worse. Translation No Nurse Assessment Nurse: Zenia Resides, RN, Diane Date/Time Eilene Ghazi Time): 04/25/2021 8:17:50 AM Confirm and document reason for call. If symptomatic, describe symptoms. ---Caller states he is in a lot of pain and can hardly get out of bed. He states the pain is around his back/ side and his lower abd. When he tries to raise up it gets worse. Symptoms started yesterday, but getting worse. Pain is severe. No blood in BM or urine. Does the patient have any new or worsening symptoms? ---Yes Will a triage be completed? ---Yes Related visit to physician within the last 2 weeks? ---No Does the PT have any chronic conditions? (i.e. diabetes, asthma, this includes High risk factors for pregnancy, etc.) ---No Is this a behavioral health or substance abuse call? ---No Guidelines Guideline Title Affirmed Question Affirmed Notes Nurse Date/Time Eilene Ghazi Time) Abdominal Pain - Male [1] SEVERE pain (e.g., excruciating) AND [2] present > 1 hour Zenia Resides, RN, Diane 04/25/2021 8:19:43 AM Disp. Time Eilene Ghazi Time) Disposition  Final User 04/25/2021 8:21:14 AM Go to ED Now Yes Zenia Resides, RN, Diane PLEASE NOTE: All timestamps contained within this report are represented as [place] Standard Time. CONFIDENTIALTY NOTICE: This fax transmission is intended only for the addressee. It contains information that is legally privileged, confidential or otherwise protected from use or disclosure. If you are not the intended recipient, you are strictly prohibited from reviewing, disclosing, copying using or disseminating any of this information or taking any action in reliance on or regarding this information. If you have received this fax in error, please notify us immediately by telephone so that we can arrange for its return to [place]. Phone: [phone], Toll-Free: [phone], Fax: [phone] Page: 2 of 2 Call Id: [id number] Joes Disagree/Comply Comply Caller Understands Yes PreDisposition Did not know what to do Care Advice Given Per Guideline GO TO ED NOW: * You need to be seen in the Emergency Department. ANOTHER ADULT SHOULD DRIVE: * It is better and safer if another adult drives instead of you. NOTHING BY MOUTH: * Do not eat or drink anything for now. CARE ADVICE given per Abdominal Pain, Male (Adult) guideline. Referrals [facility] - ED

## 2021-04-30 NOTE — Telephone Encounter (Signed)
Tried calling the patient, he did not answer and his VM is not set up.

## 2021-05-01 NOTE — Telephone Encounter (Signed)
Called pt and still no answer or VM, will close encounter and address if pt calls back

## 2021-05-16 ENCOUNTER — Ambulatory Visit: Payer: 59

## 2021-12-13 ENCOUNTER — Other Ambulatory Visit: Payer: Self-pay | Admitting: Family Medicine

## 2021-12-18 ENCOUNTER — Encounter: Payer: Self-pay | Admitting: Family Medicine

## 2021-12-18 ENCOUNTER — Ambulatory Visit (INDEPENDENT_AMBULATORY_CARE_PROVIDER_SITE_OTHER)
Admission: RE | Admit: 2021-12-18 | Discharge: 2021-12-18 | Disposition: A | Discharge status: Home / Self Care | Payer: 59 | Source: Ambulatory Visit | Attending: Family Medicine | Admitting: Family Medicine

## 2021-12-18 ENCOUNTER — Ambulatory Visit (INDEPENDENT_AMBULATORY_CARE_PROVIDER_SITE_OTHER): Payer: 59 | Admitting: Family Medicine

## 2021-12-18 VITALS — BP 126/68 | HR 102 | Temp 97.8°F | Ht 71.25 in | Wt 276.4 lb

## 2021-12-18 DIAGNOSIS — I10 Essential (primary) hypertension: Secondary | ICD-10-CM | POA: Diagnosis not present

## 2021-12-18 DIAGNOSIS — M25562 Pain in left knee: Secondary | ICD-10-CM

## 2021-12-18 DIAGNOSIS — R7303 Prediabetes: Secondary | ICD-10-CM | POA: Diagnosis not present

## 2021-12-18 DIAGNOSIS — E7800 Pure hypercholesterolemia, unspecified: Secondary | ICD-10-CM | POA: Diagnosis not present

## 2021-12-18 DIAGNOSIS — K429 Umbilical hernia without obstruction or gangrene: Secondary | ICD-10-CM

## 2021-12-18 DIAGNOSIS — Z6838 Body mass index (BMI) 38.0-38.9, adult: Secondary | ICD-10-CM

## 2021-12-18 DIAGNOSIS — N529 Male erectile dysfunction, unspecified: Secondary | ICD-10-CM | POA: Diagnosis not present

## 2021-12-18 DIAGNOSIS — G8929 Other chronic pain: Secondary | ICD-10-CM | POA: Diagnosis not present

## 2021-12-18 LAB — CBC WITH DIFFERENTIAL/PLATELET
Basophils Absolute: 0.1 10*3/uL (ref 0.0–0.1)
Basophils Relative: 1.3 % (ref 0.0–3.0)
Eosinophils Absolute: 0.1 10*3/uL (ref 0.0–0.7)
Eosinophils Relative: 1.2 % (ref 0.0–5.0)
HCT: 40.9 % (ref 39.0–52.0)
Hemoglobin: 13.8 g/dL (ref 13.0–17.0)
Lymphocytes Relative: 28.7 % (ref 12.0–46.0)
Lymphs Abs: 2.8 10*3/uL (ref 0.7–4.0)
MCHC: 33.7 g/dL (ref 30.0–36.0)
MCV: 98.7 fl (ref 78.0–100.0)
Monocytes Absolute: 0.7 10*3/uL (ref 0.1–1.0)
Monocytes Relative: 7 % (ref 3.0–12.0)
Neutro Abs: 6.1 10*3/uL (ref 1.4–7.7)
Neutrophils Relative %: 61.8 % (ref 43.0–77.0)
Platelets: 296 10*3/uL (ref 150.0–400.0)
RBC: 4.15 Mil/uL — ABNORMAL LOW (ref 4.22–5.81)
RDW: 13.1 % (ref 11.5–15.5)
WBC: 9.8 10*3/uL (ref 4.0–10.5)

## 2021-12-18 LAB — COMPREHENSIVE METABOLIC PANEL
ALT: 27 U/L (ref 0–53)
AST: 24 U/L (ref 0–37)
Albumin: 4 g/dL (ref 3.5–5.2)
Alkaline Phosphatase: 55 U/L (ref 39–117)
BUN: 19 mg/dL (ref 6–23)
CO2: 30 mEq/L (ref 19–32)
Calcium: 9.5 mg/dL (ref 8.4–10.5)
Chloride: 98 mEq/L (ref 96–112)
Creatinine, Ser: 1.15 mg/dL (ref 0.40–1.50)
GFR: 68.92 mL/min (ref >60.00)
Glucose, Bld: 141 mg/dL — ABNORMAL HIGH (ref 70–99)
Potassium: 4.2 mEq/L (ref 3.5–5.1)
Sodium: 135 mEq/L (ref 135–145)
Total Bilirubin: 0.9 mg/dL (ref 0.2–1.2)
Total Protein: 7.2 g/dL (ref 6.0–8.3)

## 2021-12-18 LAB — LIPID PANEL
Cholesterol: 146 mg/dL (ref 0–200)
HDL: 45.7 mg/dL (ref >39.00)
LDL Cholesterol: 67 mg/dL (ref 0–99)
NonHDL: 100.77
Total CHOL/HDL Ratio: 3
Triglycerides: 167 mg/dL — ABNORMAL HIGH (ref 0.0–149.0)
VLDL: 33.4 mg/dL (ref 0.0–40.0)

## 2021-12-18 LAB — TSH: TSH: 0.6 u[IU]/mL (ref 0.35–5.50)

## 2021-12-18 LAB — HEMOGLOBIN A1C: Hgb A1c MFr Bld: 6.3 % (ref 4.6–6.5)

## 2021-12-18 NOTE — Assessment & Plan Note (Signed)
Suspect OA ?Medial pain  ?Limited flexion  ?Worse after inactivity/stiffness ? ?Recommend voltaren gel ?Ice prn  ?Avoid high impact activities ?xr today  ?May need injection ultimately  ?Wt loss would help  ?

## 2021-12-18 NOTE — Progress Notes (Addendum)
? ?Subjective:  ? ? Patient ID: Sean Lee, male    DOB: August 21, 1960, 61 y.o.   MRN: [id number] ? ?HPI ?Pt presents for medication refill for HTN and also knee pain and chronic medical problems  ?Also hernia /umbilical- bothersome  ?Wt Readings from Last 3 Encounters:  ?12/18/21 276 lb 6 oz (125.4 kg)  ?12/31/20 264 lb 8 oz (120 kg)  ?04/02/20 277 lb 2 oz (125.7 kg)  ? ?38.28 kg/m? ? ?When not on vacation diet is ok  ?Does not eat as much  ?Fruit in am  ?Very small dinner/goes to bed early  ? ?Likes to walk when knee does not bother him  ?35 min max if that  ? ? ?Just came off of vacation -was [place] of the country  ?[place] for his [place]  ?Great trip  ? ?Acute on chronic pain in L knee  ?Is killing him ?On and off  ?Medial pain  ?Worse after inactivity  ?Not swollen  ?Walking and getting in/out of truck hurts  ? ? ?Right ankle stays swollen/no pain  ? ?He had a DOT physical  ?Hernia /umbilical  ?Bothersome now ?Wants to see a surgeon  ? ?HTN ?bp is stable today  ?No cp or palpitations or headaches or edema  ?No side effects to medicines  ?BP Readings from Last 3 Encounters:  ?12/18/21 126/68  ?01/19/21 (!) 140/92  ?12/31/20 120/80  ?   ? ?Olmesartan hct 40-25 mg daily  ? ? ?Lab Results  ?Component Value Date  ? CREATININE 1.25 (H) 01/19/2021  ? BUN 15 01/19/2021  ? NA 133 (L) 01/19/2021  ? K 3.5 01/19/2021  ? CL 96 (L) 01/19/2021  ? CO2 28 01/19/2021  ? ?Due for labs ?Fluid intake is pretty good/some water  ? ?Hyperlipidemia ?Lab Results  ?Component Value Date  ? CHOL 144 04/02/2020  ? HDL 45.20 04/02/2020  ? [place] 70 04/02/2020  ? TRIG 145.0 04/02/2020  ? CHOLHDL 3 04/02/2020  ? ?Diet controlled  ?Eats fish and chicken   (sometimes fried) ?Avoids red meat most of the time  ? ? ? ?Prediabetes ?Lab Results  ?Component Value Date  ? HGBA1C 6.4 04/02/2020  ?Really watches the sugar  ?Moderation with carbs  ?Lots of vegetables  ? ?Patient Active Problem List  ? Diagnosis Date Noted  ? Left knee pain 12/18/2021  ?  Umbilical hernia 48/54/6270  ? ED (erectile dysfunction) 12/31/2020  ? Insomnia 04/02/2020  ? Right ankle swelling 07/25/2019  ? Gout 11/03/2018  ? Chronic pain of right ankle 08/31/2018  ? Routine general medical examination at a health care facility 11/01/2017  ? Prostate cancer screening 06/28/2017  ? Alopecia 11/04/2016  ? Back pain 05/06/2016  ? Sleep disorder 08/28/2015  ? Colon cancer screening 08/28/2015  ? Prediabetes 01/02/2013  ? Varicosities of leg 03/21/2012  ? Rectus diastasis of lower abdomen 03/21/2012  ? Obstructive sleep apnea 10/15/2009  ? Class 2 severe obesity due to excess calories with serious comorbidity and body mass index (BMI) of 38.0 to 38.9 in adult [facility]) 09/02/2009  ? Hyperlipidemia 02/02/2007  ? HEMATURIA, MICROSCOPIC 02/02/2007  ? Essential hypertension 02/01/2007  ? CEREBROVASCULAR ACCIDENT, HX OF 02/01/2007  ? ?Past Medical History:  ?Diagnosis Date  ? Contact dermatitis and other eczema due to plants (except food)   ? H/O: CVA (cardiovascular accident)   ? Carotid doppler neg (4/08)  ? Hematuria   ? Obesity, unspecified   ? Other and unspecified hyperlipidemia   ? Hx -  no medication/diet controll and weight loss  ? Sacroiliitis, not elsewhere classified ([place])   ? Sleep apnea   ? uses CPAP but not every night  ? Unspecified essential hypertension   ? ?Past Surgical History:  ?Procedure Laterality Date  ? Carotid dopplers  4/08  ? Negative  ? HERNIA REPAIR    ? umbilical  ? ?Social History  ? ?Tobacco Use  ? Smoking status: Never  ? Smokeless tobacco: Never  ?Substance Use Topics  ? Alcohol use: Yes  ?  Alcohol/week: 4.0 standard drinks  ?  Types: 4 Cans of beer per week  ?  Comment: weekends  ? Drug use: No  ? ?Family History  ?Problem Relation Age of Onset  ? Hypertension Mother   ? Diabetes Mother   ? Hyperlipidemia Unknown   ?     in family   ? Cancer Neg Hx   ? Esophageal cancer Neg Hx   ? Rectal cancer Neg Hx   ? Stomach cancer Neg Hx   ? Colon cancer Neg Hx   ? ?Allergies   ?Allergen Reactions  ? Benazepril Hcl   ?  REACTION: cough  ? ?Current Outpatient Medications on File Prior to Visit  ?Medication Sig Dispense Refill  ? aspirin 325 MG tablet Take 325 mg by mouth daily.    ? latanoprost (XALATAN) 0.005 % ophthalmic solution Place 1 drop into both eyes at bedtime.    ? olmesartan-hydrochlorothiazide (BENICAR HCT) 40-25 MG tablet TAKE 1 TABLET DAILY 90 tablet 0  ? sildenafil (VIAGRA) 50 MG tablet Take 1 tablet (50 mg total) by mouth daily as needed for erectile dysfunction. 30 minutes before sexual activity 10 tablet 3  ? ?No current facility-administered medications on file prior to visit.  ?  ? ?Review of Systems  ?Constitutional:  Negative for activity change, appetite change, fatigue, fever and unexpected weight change.  ?HENT:  Negative for congestion, rhinorrhea, sore throat and trouble swallowing.   ?Eyes:  Negative for pain, redness, itching and visual disturbance.  ?Respiratory:  Negative for cough, chest tightness, shortness of breath and wheezing.   ?Cardiovascular:  Negative for chest pain and palpitations.  ?Gastrointestinal:  Negative for abdominal pain, blood in stool, constipation, diarrhea and nausea.  ?     Hernia above belly button  ?Endocrine: Negative for cold intolerance, heat intolerance, polydipsia and polyuria.  ?Genitourinary:  Negative for difficulty urinating, dysuria, frequency and urgency.  ?Musculoskeletal:  Positive for arthralgias. Negative for joint swelling and myalgias.  ?Skin:  Negative for pallor and rash.  ?Neurological:  Negative for dizziness, tremors, weakness, numbness and headaches.  ?Hematological:  Negative for adenopathy. Does not bruise/bleed easily.  ?Psychiatric/Behavioral:  Negative for decreased concentration and dysphoric mood. The patient is not nervous/anxious.   ? ?   ?Objective:  ? Physical Exam ?Constitutional:   ?   General: He is not in acute distress. ?   Appearance: Normal appearance. He is well-developed. He is obese. He  is not ill-appearing or diaphoretic.  ?HENT:  ?   Head: Normocephalic and atraumatic.  ?Eyes:  ?   Conjunctiva/sclera: Conjunctivae normal.  ?   Pupils: Pupils are equal, round, and reactive to light.  ?Neck:  ?   Thyroid: No thyromegaly.  ?   Vascular: No carotid bruit or JVD.  ?Cardiovascular:  ?   Rate and Rhythm: Normal rate and regular rhythm.  ?   Heart sounds: Normal heart sounds.  ?  No gallop.  ?Pulmonary:  ?  Effort: Pulmonary effort is normal. No respiratory distress.  ?   Breath sounds: Normal breath sounds. No wheezing or rales.  ?Abdominal:  ?   General: There is no distension or abdominal bruit.  ?   Palpations: Abdomen is soft.  ?   Hernia: A hernia is present. Hernia is present in the umbilical area.  ?   Comments: Umbilical hernia above umbilicus  ?4-5 cm  ?Reducible and soft  ?Non tender  ? ?Scar noted beneath umbilicus  ?Musculoskeletal:  ?   Cervical back: Normal range of motion and neck supple.  ?   Right lower leg: No edema.  ?   Left lower leg: No edema.  ?   Comments: Knee left: ?No swelling or effusion  ?No warmth to the touch  ?Mild crepitus  ?ROM:  ?Flex : pain at endpoint  ?Ext : nl  ?Mcmurray: some discomfort  ?Bounce test : neg ? ?Stability: ?Anterior drawer: nl ?Lachman exam :nl ? ?Tenderness : medial joint line  ? ?Gait : nl  ? ? ?  ?Lymphadenopathy:  ?   Cervical: No cervical adenopathy.  ?Skin: ?   General: Skin is warm and dry.  ?   Coloration: Skin is not pale.  ?   Findings: No rash.  ?Neurological:  ?   Mental Status: He is alert.  ?   Coordination: Coordination normal.  ?   Deep Tendon Reflexes: Reflexes are normal and symmetric. Reflexes normal.  ?Psychiatric:     ?   Mood and Affect: Mood normal.  ? ? ? ? ? ?   ?Assessment & Plan:  ? ?Problem List Items Addressed This Visit   ? ?  ? Cardiovascular and Mediastinum  ? Essential hypertension - Primary  ?  bp in fair control at this time  ?BP Readings from Last 1 Encounters:  ?12/18/21 126/68  ?No changes needed ?Most recent  labs reviewed  ?Disc lifstyle change with low sodium diet and exercise  ?Plan to continue olmesartan hct 40-25 mg daily  ?Labs ordered  ?  ?  ? Relevant Orders  ? TSH (Completed)  ? Lipid panel (Completed)  ?

## 2021-12-18 NOTE — Assessment & Plan Note (Signed)
bp in fair control at this time  ?BP Readings from Last 1 Encounters:  ?12/18/21 126/68  ? ?No changes needed ?Most recent labs reviewed  ?Disc lifstyle change with low sodium diet and exercise  ?Plan to continue olmesartan hct 40-25 mg daily  ?Labs ordered  ?

## 2021-12-18 NOTE — Assessment & Plan Note (Signed)
Disc goals for lipids and reasons to control them ?Rev last labs with pt ?Rev low sat fat diet in detail ? ?Diet worse on vacation otherwise low trans/sat fat  ? ?Lab today ?

## 2021-12-18 NOTE — Assessment & Plan Note (Signed)
Discussed how this problem influences overall health and the risks it imposes  ?Reviewed plan for weight loss with lower calorie diet (via better food choices and also portion control or program like weight watchers) and exercise building up to or more than 30 minutes 5 days per week including some aerobic activity  ? ?Wt up after vacation  ?Plans to get back to normal eating  ?Exercise is limited due to L knee pain  ?

## 2021-12-18 NOTE — Patient Instructions (Addendum)
For kidney health get 64 oz of fluid daily , mostly water  ? ? ?For cholesterol ?Avoid red meat/ fried foods/ egg yolks/ fatty breakfast meats/ butter, cheese and high fat dairy/ and shellfish   ?To prevent diabetes ?Try to get most of your carbohydrates from produce (with the exception of white potatoes)  ?Eat less bread/pasta/rice/snack foods/cereals/sweets and other items from the middle of the grocery store (processed carbs) ? ?I placed a referral to general surgery  ?You will get a call  ?If you don't hear in 1-2 weeks please let us know  ? ?Labs today  ? ?

## 2021-12-18 NOTE — Assessment & Plan Note (Signed)
A1C ordered ?Wt gain noted ? ?disc imp of low glycemic diet and wt loss to prevent DM2  ?

## 2021-12-18 NOTE — Assessment & Plan Note (Addendum)
Above umbilicus  ?Had surgery in past for one /lower  ?Is reducible and non tender ?Ref done to gen surgeon  ? ?ER precautions-watch for pain or inc size ? ?

## 2021-12-18 NOTE — Assessment & Plan Note (Signed)
viagra 50 mg prn  ? ?

## 2021-12-22 NOTE — Addendum Note (Signed)
Addended by: Loura Pardon A on: 12/22/2021 07:27 PM ? ? Modules accepted: Orders ? ?

## 2022-01-01 ENCOUNTER — Ambulatory Visit (INDEPENDENT_AMBULATORY_CARE_PROVIDER_SITE_OTHER): Payer: 59 | Admitting: Orthopaedic Surgery

## 2022-01-01 ENCOUNTER — Other Ambulatory Visit: Payer: Self-pay | Admitting: Orthopaedic Surgery

## 2022-01-01 VITALS — Ht 71.0 in | Wt 280.0 lb

## 2022-01-01 DIAGNOSIS — M1712 Unilateral primary osteoarthritis, left knee: Secondary | ICD-10-CM | POA: Diagnosis not present

## 2022-01-01 DIAGNOSIS — M1711 Unilateral primary osteoarthritis, right knee: Secondary | ICD-10-CM | POA: Insufficient documentation

## 2022-01-01 MED ORDER — MELOXICAM 7.5 MG PO TABS: 7.5000 mg | ORAL_TABLET | Freq: Two times a day (BID) | ORAL | 2 refills | Status: DC | PRN

## 2022-01-01 MED ORDER — METHYLPREDNISOLONE ACETATE 40 MG/ML IJ SUSP
40.0000 mg | INTRAMUSCULAR | Status: AC | PRN
  Administered 2022-01-01: 40 mg via INTRA_ARTICULAR

## 2022-01-01 MED ORDER — LIDOCAINE HCL 1 % IJ SOLN
2.0000 mL | INTRAMUSCULAR | Status: AC | PRN
  Administered 2022-01-01: 2 mL

## 2022-01-01 MED ORDER — DICLOFENAC SODIUM 2 % EX SOLN: 2.0000 g | Freq: Two times a day (BID) | CUTANEOUS | 3 refills | Status: DC | PRN

## 2022-01-01 MED ORDER — BUPIVACAINE HCL 0.5 % IJ SOLN
2.0000 mL | INTRAMUSCULAR | Status: AC | PRN
  Administered 2022-01-01: 2 mL via INTRA_ARTICULAR

## 2022-01-01 NOTE — Progress Notes (Signed)
Office Visit Note   Patient: Sean Lee           Date of Birth: [birth year]           MRN: [id number] Visit Date: 01/01/2022              Requested by: Tower, Wynelle Fanny, MD [place],  [place] [address] PCP: Abner Greenspan, MD   Assessment & Plan: Visit Diagnoses:  1. Primary osteoarthritis of left knee     Plan: Impression is advanced tricompartment DJD with varus deformity.  Medial compartment is bone-on-bone.  He may have a component degenerative medial meniscal tear as well although his symptoms are more consistent with end-stage DJD.  Treatment options were reviewed with the patient in detail and he elected to undergo cortisone injection today.  Prescription for Mobic and Pennsaid.  Pamphlet on total knee replacement and Visco provided today.  He has my card if he needs to get in touch with me.  Follow-Up Instructions: No follow-ups on file.   Orders:  No orders of the defined types were placed in this encounter.  Meds ordered this encounter  Medications   Diclofenac Sodium (PENNSAID) 2 % SOLN    Sig: Apply 2 g topically 2 (two) times daily as needed (to affected area).    Dispense:  112 g    Refill:  3   meloxicam (MOBIC) 7.5 MG tablet    Sig: Take 1 tablet (7.5 mg total) by mouth 2 (two) times daily as needed for pain.    Dispense:  30 tablet    Refill:  2      Procedures: Large Joint Inj: L knee on 01/01/2022 8:53 AM Details: 22 G needle Medications: 2 mL bupivacaine 0.5 %; 2 mL lidocaine 1 %; 40 mg methylPREDNISolone acetate 40 MG/ML Outcome: tolerated well, no immediate complications Patient was prepped and draped in the usual sterile fashion.      Clinical Data: No additional findings.   Subjective: Chief Complaint  Patient presents with   Left Knee - Pain    HPI Mr. Sean Lee is a very pleasant 61 year old gentleman who comes in for evaluation of several months of constant left knee pain.  Denies any injuries.  Denies any  mechanical symptoms.  The pain has gotten worse and is causing him to stumble when he walks.  Works Geneticist, [employer] to [employer].  Review of Systems  Constitutional: Negative.   All other systems reviewed and are negative.   Objective: Vital Signs: Ht '5\' 11"'$  (1.803 m)   Wt 280 lb (127 kg)   BMI 39.05 kg/m   Physical Exam Vitals and nursing note reviewed.  Constitutional:      Appearance: He is well-developed.  HENT:     Head: Normocephalic and atraumatic.  Eyes:     Pupils: Pupils are equal, round, and reactive to light.  Pulmonary:     Effort: Pulmonary effort is normal.  Abdominal:     Palpations: Abdomen is soft.  Musculoskeletal:        General: Normal range of motion.     Cervical back: Neck supple.  Skin:    General: Skin is warm.  Neurological:     Mental Status: He is alert and oriented to person, place, and time.  Psychiatric:        Behavior: Behavior normal.        Thought Content: Thought content normal.        Judgment:  Judgment normal.    Ortho Exam Examination of the left knee shows trace effusion.  Pain and crepitus throughout arc of motion.  Collaterals and cruciates are stable.  Slight varus deformity.  Slight medial joint line tenderness.  Negative McMurray. Specialty Comments:  No specialty comments available.  Imaging: No results found. Advanced left knee DJD with varus deformity.  Bone on bone joint space narrowing   PMFS History: Patient Active Problem List   Diagnosis Date Noted   Primary osteoarthritis of left knee 01/01/2022   Primary osteoarthritis of right knee 01/01/2022   Left knee pain 16/05/9603   Umbilical hernia 54/04/8118   ED (erectile dysfunction) 12/31/2020   Insomnia 04/02/2020   Right ankle swelling 07/25/2019   Gout 11/03/2018   Chronic pain of right ankle 08/31/2018   Routine general medical examination at a health care facility 11/01/2017   Prostate cancer screening 06/28/2017   Alopecia 11/04/2016    Back pain 05/06/2016   Sleep disorder 08/28/2015   Colon cancer screening 08/28/2015   Prediabetes 01/02/2013   Varicosities of leg 03/21/2012   Rectus diastasis of lower abdomen 03/21/2012   Obstructive sleep apnea 10/15/2009   Class 2 severe obesity due to excess calories with serious comorbidity and body mass index (BMI) of 38.0 to 38.9 in adult ([place]) 09/02/2009   Hyperlipidemia 02/02/2007   HEMATURIA, MICROSCOPIC 02/02/2007   Essential hypertension 02/01/2007   CEREBROVASCULAR ACCIDENT, HX OF 02/01/2007   Past Medical History:  Diagnosis Date   Contact dermatitis and other eczema due to plants (except food)    H/O: CVA (cardiovascular accident)    Carotid doppler neg (4/08)   Hematuria    Obesity, unspecified    Other and unspecified hyperlipidemia    Hx - no medication/diet controll and weight loss   Sacroiliitis, not elsewhere classified ([place])    Sleep apnea    uses CPAP but not every night   Unspecified essential hypertension     Family History  Problem Relation Age of Onset   Hypertension Mother    Diabetes Mother    Hyperlipidemia Unknown        in family    Cancer Neg Hx    Esophageal cancer Neg Hx    Rectal cancer Neg Hx    Stomach cancer Neg Hx    Colon cancer Neg Hx     Past Surgical History:  Procedure Laterality Date   Carotid dopplers  4/08   Negative   HERNIA REPAIR     umbilical   Social History   Occupational History   Occupation: Truck Education administrator: [employer]  Tobacco Use   Smoking status: Never   Smokeless tobacco: Never  Substance and Sexual Activity   Alcohol use: Yes    Alcohol/week: 4.0 standard drinks    Types: 4 Cans of beer per week    Comment: weekends   Drug use: No   Sexual activity: Not on file

## 2022-01-02 ENCOUNTER — Encounter: Payer: Self-pay | Admitting: *Deleted

## 2022-01-08 ENCOUNTER — Other Ambulatory Visit: Payer: Self-pay | Admitting: Family Medicine

## 2022-02-07 ENCOUNTER — Emergency Department (HOSPITAL_COMMUNITY)
Admission: EM | Admit: 2022-02-07 | Discharge: 2022-02-07 | Disposition: A | Discharge status: Home / Self Care | Payer: 59 | Attending: Student | Admitting: Student

## 2022-02-07 ENCOUNTER — Encounter (HOSPITAL_COMMUNITY): Payer: Self-pay | Admitting: *Deleted

## 2022-02-07 ENCOUNTER — Other Ambulatory Visit: Payer: Self-pay

## 2022-02-07 DIAGNOSIS — I83899 Varicose veins of unspecified lower extremities with other complications: Secondary | ICD-10-CM

## 2022-02-07 DIAGNOSIS — Z79899 Other long term (current) drug therapy: Secondary | ICD-10-CM | POA: Diagnosis not present

## 2022-02-07 DIAGNOSIS — I83892 Varicose veins of left lower extremities with other complications: Secondary | ICD-10-CM | POA: Insufficient documentation

## 2022-02-07 DIAGNOSIS — I10 Essential (primary) hypertension: Secondary | ICD-10-CM | POA: Diagnosis not present

## 2022-02-07 HISTORY — DX: Asymptomatic varicose veins of left lower extremity: I83.92

## 2022-02-07 MED ORDER — LIDOCAINE 5 % EX PTCH
1.0000 | MEDICATED_PATCH | CUTANEOUS | Status: DC
  Administered 2022-02-07: 1 via TRANSDERMAL
  Filled 2022-02-07: qty 1

## 2022-02-07 MED ORDER — DICLOFENAC SODIUM 1 % EX GEL: 2.0000 g | Freq: Four times a day (QID) | CUTANEOUS | 0 refills | Status: DC

## 2022-02-07 MED ORDER — LIDOCAINE-EPINEPHRINE (PF) 2 %-1:200000 IJ SOLN
10.0000 mL | Freq: Once | INTRAMUSCULAR | Status: AC
  Administered 2022-02-07: 10 mL via INTRADERMAL
  Filled 2022-02-07: qty 20

## 2022-02-07 NOTE — ED Triage Notes (Signed)
Pt scratched a scab off the left lower leg, began to bleed. Pt with varicose veins.s

## 2022-03-17 ENCOUNTER — Other Ambulatory Visit: Payer: Self-pay | Admitting: Family Medicine

## 2022-05-21 ENCOUNTER — Encounter: Payer: Self-pay | Admitting: Orthopaedic Surgery

## 2022-05-21 ENCOUNTER — Ambulatory Visit (INDEPENDENT_AMBULATORY_CARE_PROVIDER_SITE_OTHER): Payer: 59 | Admitting: Orthopaedic Surgery

## 2022-05-21 ENCOUNTER — Ambulatory Visit (INDEPENDENT_AMBULATORY_CARE_PROVIDER_SITE_OTHER): Payer: 59

## 2022-05-21 DIAGNOSIS — M1712 Unilateral primary osteoarthritis, left knee: Secondary | ICD-10-CM | POA: Diagnosis not present

## 2022-05-21 MED ORDER — DICLOFENAC SODIUM 2 % EX SOLN: 2.0000 | Freq: Two times a day (BID) | CUTANEOUS | 3 refills | Status: DC | PRN

## 2022-05-21 NOTE — Progress Notes (Signed)
Office Visit Note   Patient: Sean Lee           Date of Birth: 09/23/1960           MRN: [id number] Visit Date: 05/21/2022              Requested by: Tower, Wynelle Fanny, MD [place],  [place] [id number] PCP: Abner Greenspan, MD   Assessment & Plan: Visit Diagnoses:  1. Primary osteoarthritis of left knee     Plan: Impression is end-stage left knee DJD with varus deformity.  He has bone-on-bone in the medial compartment.  Unfortunately cortisone injection was not effective.  Based on his options he has elected to move forward with a left total knee replacement.  Risk benefits prognosis reviewed.  Denies history of nickel allergy.  Does not smoke or drink.  Lives with wife at home.  Debbie met with the patient today.  Questions encouraged and answered.  Follow-Up Instructions: No follow-ups on file.   Orders:  Orders Placed This Encounter  Procedures   XR KNEE 3 VIEW LEFT   Meds ordered this encounter  Medications   diclofenac Sodium (PENNSAID) 2 % SOLN    Sig: Apply 2 Pump (40 mg total) topically 2 (two) times daily as needed (to affected area).    Dispense:  112 g    Refill:  3      Procedures: No procedures performed   Clinical Data: No additional findings.   Subjective: Chief Complaint  Patient presents with   Left Knee - Pain    HPI Sean Lee is a very pleasant gentleman who returns today for follow-up of left knee pain from end-stage DJD.  Cortisone injection helped slightly which was done in May.  He has severe pain that interferes with quality life and ADLs and interferes with sleeping.  Review of Systems  Constitutional: Negative.   All other systems reviewed and are negative.    Objective: Vital Signs: There were no vitals taken for this visit.  Physical Exam Vitals and nursing note reviewed.  Constitutional:      Appearance: He is well-developed.  HENT:     Head: Normocephalic and atraumatic.  Eyes:     Pupils: Pupils  are equal, round, and reactive to light.  Pulmonary:     Effort: Pulmonary effort is normal.  Abdominal:     Palpations: Abdomen is soft.  Musculoskeletal:        General: Normal range of motion.     Cervical back: Neck supple.  Skin:    General: Skin is warm.  Neurological:     Mental Status: He is alert and oriented to person, place, and time.  Psychiatric:        Behavior: Behavior normal.        Thought Content: Thought content normal.        Judgment: Judgment normal.     Ortho Exam Examination left knee shows slight varus alignment.  Medial joint line tenderness.  Collaterals and cruciates are stable. Specialty Comments:  No specialty comments available.  Imaging: XR KNEE 3 VIEW LEFT  Result Date: 05/21/2022 Advanced tricompartmental degenerative joint disease.  Bone-on-bone joint space narrowing of medial compartment.  Varus deformity.    PMFS History: Patient Active Problem List   Diagnosis Date Noted   Primary osteoarthritis of left knee 01/01/2022   Primary osteoarthritis of right knee 01/01/2022   Left knee pain 88/28/0034   Umbilical hernia 91/79/1505  ED (erectile dysfunction) 12/31/2020   Insomnia 04/02/2020   Right ankle swelling 07/25/2019   Gout 11/03/2018   Chronic pain of right ankle 08/31/2018   Routine general medical examination at a health care facility 11/01/2017   Prostate cancer screening 06/28/2017   Alopecia 11/04/2016   Back pain 05/06/2016   Sleep disorder 08/28/2015   Colon cancer screening 08/28/2015   Prediabetes 01/02/2013   Varicosities of leg 03/21/2012   Rectus diastasis of lower abdomen 03/21/2012   Obstructive sleep apnea 10/15/2009   Class 2 severe obesity due to excess calories with serious comorbidity and body mass index (BMI) of 38.0 to 38.9 in adult ([facility]) 09/02/2009   Hyperlipidemia 02/02/2007   HEMATURIA, MICROSCOPIC 02/02/2007   Essential hypertension 02/01/2007   CEREBROVASCULAR ACCIDENT, HX OF 02/01/2007   Past  Medical History:  Diagnosis Date   Contact dermatitis and other eczema due to plants (except food)    H/O: CVA (cardiovascular accident)    Carotid doppler neg (4/08)   Hematuria    Obesity, unspecified    Other and unspecified hyperlipidemia    Hx - no medication/diet controll and weight loss   Sacroiliitis, not elsewhere classified ([facility])    Sleep apnea    uses CPAP but not every night   Unspecified essential hypertension    Varicose veins of left lower extremity     Family History  Problem Relation Age of Onset   Hypertension Mother    Diabetes Mother    Hyperlipidemia Unknown        in family    Cancer Neg Hx    Esophageal cancer Neg Hx    Rectal cancer Neg Hx    Stomach cancer Neg Hx    Colon cancer Neg Hx     Past Surgical History:  Procedure Laterality Date   Carotid dopplers  4/08   Negative   HERNIA REPAIR     umbilical   Social History   Occupational History   Occupation: Truck Driver    Employer: [employer]  Tobacco Use   Smoking status: Never   Smokeless tobacco: Never  Substance and Sexual Activity   Alcohol use: Yes    Alcohol/week: 4.0 standard drinks of alcohol    Types: 4 Cans of beer per week    Comment: weekends   Drug use: No   Sexual activity: Not on file        

## 2022-05-27 ENCOUNTER — Other Ambulatory Visit: Payer: Self-pay

## 2022-06-10 NOTE — Pre-Procedure Instructions (Signed)
Surgical Instructions    Your procedure is scheduled on Thursday, November 2nd.  Report to [facility] Main Entrance "A" at 05:30 A.M., then check in with the Admitting office.  Call this number if you have problems the morning of surgery:  [phone]   If you have any questions prior to your surgery date call [phone]: Open Monday-Friday 8am-4pm    Remember:  Do not eat after midnight the night before your surgery  You may drink clear liquids until 04:30 AM the morning of your surgery.   Clear liquids allowed are: Water, Non-Citrus Juices (without pulp), Carbonated Beverages, Clear Tea, Black Coffee Only (NO MILK, CREAM OR POWDERED CREAMER of any kind), and Gatorade.   Patient Instructions  The night before surgery:  No food after midnight. ONLY clear liquids after midnight  The day of surgery (if you do NOT have diabetes):  Drink ONE (1) Pre-Surgery Clear Ensure by 04:30 AM the morning of surgery. Drink in one sitting. Do not sip.  This drink was given to you during your hospital  pre-op appointment visit.  Nothing else to drink after completing the  Pre-Surgery Clear Ensure.          If you have questions, please contact your surgeon's office.     Take these medicines the morning of surgery with A SIP OF WATER: none    As of today, STOP taking any Aspirin (unless otherwise instructed by your surgeon) Aleve, Naproxen, Ibuprofen, Motrin, Advil, Goody's, BC's, all herbal medications, fish oil, and all vitamins. This includes meloxicam (MOBIC).                     Do NOT Smoke (Tobacco/Vaping) for 24 hours prior to your procedure.  If you use a CPAP at night, you may bring your mask/headgear for your overnight stay.   Contacts, glasses, piercing's, hearing aid's, dentures or partials may not be worn into surgery, please bring cases for these belongings.    For patients admitted to the hospital, discharge time will be determined by your treatment team.   Patients  discharged the day of surgery will not be allowed to drive home, and someone needs to stay with them for 24 hours.  SURGICAL WAITING ROOM VISITATION Patients having surgery or a procedure may have no more than 2 support people in the waiting area - these visitors may rotate.   Children under the age of 51 must have an adult with them who is not the patient. If the patient needs to stay at the hospital during part of their recovery, the visitor guidelines for inpatient rooms apply. Pre-op nurse will coordinate an appropriate time for 1 support person to accompany patient in pre-op.  This support person may not rotate.   Please refer to the [facility] website for the visitor guidelines for Inpatients (after your surgery is over and you are in a regular room).    Special instructions:   [place]- Preparing For Surgery  Before surgery, you can play an important role. Because skin is not sterile, your skin needs to be as free of germs as possible. You can reduce the number of germs on your skin by washing with CHG (chlorahexidine gluconate) Soap before surgery.  CHG is an antiseptic cleaner which kills germs and bonds with the skin to continue killing germs even after washing.    Oral Hygiene is also important to reduce your risk of infection.  Remember - BRUSH YOUR TEETH THE MORNING OF SURGERY WITH  YOUR REGULAR TOOTHPASTE  Please do not use if you have an allergy to CHG or antibacterial soaps. If your skin becomes reddened/irritated stop using the CHG.  Do not shave (including legs and underarms) for at least 48 hours prior to first CHG shower. It is OK to shave your face.  Please follow these instructions carefully.   Shower the NIGHT BEFORE SURGERY and the MORNING OF SURGERY  If you chose to wash your hair, wash your hair first as usual with your normal shampoo.  After you shampoo, rinse your hair and body thoroughly to remove the shampoo.  Use CHG Soap as you would any other liquid  soap. You can apply CHG directly to the skin and wash gently with a scrungie or a clean washcloth.   Apply the CHG Soap to your body ONLY FROM THE NECK DOWN.  Do not use on open wounds or open sores. Avoid contact with your eyes, ears, mouth and genitals (private parts). Wash Face and genitals (private parts)  with your normal soap.   Wash thoroughly, paying special attention to the area where your surgery will be performed.  Thoroughly rinse your body with warm water from the neck down.  DO NOT shower/wash with your normal soap after using and rinsing off the CHG Soap.  Pat yourself dry with a CLEAN TOWEL.  Wear CLEAN PAJAMAS to bed the night before surgery  Place CLEAN SHEETS on your bed the night before your surgery  DO NOT SLEEP WITH PETS.   Day of Surgery: Take a shower with CHG soap. Do not wear jewelry  Do not wear lotions, powders, colognes, or deodorant. Men may shave face and neck. Do not bring valuables to the hospital. [facility] is not responsible for any belongings or valuables.  Wear Clean/Comfortable clothing the morning of surgery Remember to brush your teeth WITH YOUR REGULAR TOOTHPASTE.   Please read over the following fact sheets that you were given.    If you received a COVID test during your pre-op visit  it is requested that you wear a mask when out in public, stay away from anyone that may not be feeling well and notify your surgeon if you develop symptoms. If you have been in contact with anyone that has tested positive in the last 10 days please notify you surgeon.

## 2022-06-11 ENCOUNTER — Other Ambulatory Visit: Payer: Self-pay

## 2022-06-11 ENCOUNTER — Encounter (HOSPITAL_COMMUNITY): Payer: Self-pay

## 2022-06-11 ENCOUNTER — Encounter (HOSPITAL_COMMUNITY)
Admission: RE | Admit: 2022-06-11 | Discharge: 2022-06-11 | Disposition: A | Discharge status: Home / Self Care | Payer: 59 | Source: Ambulatory Visit | Attending: Orthopaedic Surgery | Admitting: Orthopaedic Surgery

## 2022-06-11 VITALS — BP 154/78 | HR 68 | Temp 97.5°F | Resp 18 | Ht 73.0 in | Wt 277.5 lb

## 2022-06-11 DIAGNOSIS — M1712 Unilateral primary osteoarthritis, left knee: Secondary | ICD-10-CM | POA: Insufficient documentation

## 2022-06-11 DIAGNOSIS — Z01818 Encounter for other preprocedural examination: Secondary | ICD-10-CM | POA: Insufficient documentation

## 2022-06-11 LAB — SURGICAL PCR SCREEN
MRSA, PCR: NEGATIVE
Staphylococcus aureus: NEGATIVE

## 2022-06-11 LAB — BASIC METABOLIC PANEL
Anion gap: 10 (ref 5–15)
BUN: 20 mg/dL (ref 8–23)
CO2: 25 mmol/L (ref 22–32)
Calcium: 9 mg/dL (ref 8.9–10.3)
Chloride: 102 mmol/L (ref 98–111)
Creatinine, Ser: 1.08 mg/dL (ref 0.61–1.24)
GFR, Estimated: >60 mL/min (ref >60)
Glucose, Bld: 101 mg/dL — ABNORMAL HIGH (ref 70–99)
Potassium: 3.8 mmol/L (ref 3.5–5.1)
Sodium: 137 mmol/L (ref 135–145)

## 2022-06-11 NOTE — Progress Notes (Signed)
PCP - Loura Pardon, MD Cardiologist - denies  PPM/ICD - denies Device Orders - n/a Rep Notified - n/a  Chest x-ray - n/a EKG - 06/11/22 Stress Test - denies ECHO - 12/17/10 Cardiac Cath - denies  Sleep Study - yes, positive for OSA CPAP - yes  Fasting Blood Sugar - n/a  Blood Thinner Instructions: n/a  Aspirin Instructions: patient was instructed: As of today, STOP taking any Aspirin (unless otherwise instructed by your surgeon) Aleve, Naproxen, Ibuprofen, Motrin, Advil, Goody's, BC's, all herbal medications, fish oil, and all vitamins. This includes meloxicam (MOBIC).  ERAS Protcol - yes PRE-SURGERY Ensure - yes, until 04:30 o'clock  COVID TEST- n/a   Anesthesia review: no  Patient denies shortness of breath, fever, cough and chest pain at PAT appointment   All instructions explained to the patient, with a verbal understanding of the material. Patient agrees to go over the instructions while at home for a better understanding. Patient also instructed to self quarantine after being tested for COVID-19. The opportunity to ask questions was provided.

## 2022-06-12 ENCOUNTER — Other Ambulatory Visit: Payer: Self-pay | Admitting: Physician Assistant

## 2022-06-12 MED ORDER — DOCUSATE SODIUM 100 MG PO CAPS: 100.0000 mg | ORAL_CAPSULE | Freq: Every day | ORAL | 2 refills | Status: DC | PRN

## 2022-06-12 MED ORDER — CEFADROXIL 500 MG PO CAPS: 500.0000 mg | ORAL_CAPSULE | Freq: Two times a day (BID) | ORAL | 0 refills | Status: DC

## 2022-06-12 MED ORDER — OXYCODONE-ACETAMINOPHEN 5-325 MG PO TABS: 1.0000 | ORAL_TABLET | Freq: Four times a day (QID) | ORAL | 0 refills | Status: DC | PRN

## 2022-06-12 MED ORDER — ASPIRIN 81 MG PO TBEC: 81.0000 mg | DELAYED_RELEASE_TABLET | Freq: Two times a day (BID) | ORAL | 0 refills | Status: AC

## 2022-06-12 MED ORDER — ONDANSETRON HCL 4 MG PO TABS: 4.0000 mg | ORAL_TABLET | Freq: Three times a day (TID) | ORAL | 0 refills | Status: DC | PRN

## 2022-06-12 MED ORDER — METHOCARBAMOL 750 MG PO TABS: 750.0000 mg | ORAL_TABLET | Freq: Two times a day (BID) | ORAL | 2 refills | Status: DC | PRN

## 2022-06-15 ENCOUNTER — Other Ambulatory Visit: Payer: Self-pay | Admitting: Family Medicine

## 2022-06-17 MED ORDER — TRANEXAMIC ACID 1000 MG/10ML IV SOLN
2000.0000 mg | INTRAVENOUS | Status: DC
  Filled 2022-06-17 (×2): qty 20

## 2022-06-17 NOTE — Anesthesia Preprocedure Evaluation (Signed)
Anesthesia Evaluation  Patient identified by MRN, date of birth, ID band Patient awake    Reviewed: Allergy & Precautions, H&P , NPO status , Patient's Chart, lab work & pertinent test results  Airway Mallampati: III  TM Distance: >3 FB Neck ROM: Full    Dental no notable dental hx. (+) Teeth Intact, Dental Advisory Given   Pulmonary sleep apnea and Continuous Positive Airway Pressure Ventilation    Pulmonary exam normal breath sounds clear to auscultation       Cardiovascular Exercise Tolerance: Good hypertension, Pt. on medications  Rhythm:Regular Rate:Normal     Neuro/Psych negative neurological ROS  negative psych ROS   GI/Hepatic negative GI ROS, Neg liver ROS,,,  Endo/Other    Morbid obesity  Renal/GU negative Renal ROS  negative genitourinary   Musculoskeletal  (+) Arthritis , Osteoarthritis,    Abdominal   Peds  Hematology negative hematology ROS (+)   Anesthesia Other Findings   Reproductive/Obstetrics negative OB ROS                             Anesthesia Physical Anesthesia Plan  ASA: 3  Anesthesia Plan: Spinal   Post-op Pain Management: Regional block* and Tylenol PO (pre-op)*   Induction: Intravenous  PONV Risk Score and Plan: 2 and Propofol infusion, Midazolam and Ondansetron  Airway Management Planned: Natural Airway and Simple Face Mask  Additional Equipment:   Intra-op Plan:   Post-operative Plan:   Informed Consent: I have reviewed the patients History and Physical, chart, labs and discussed the procedure including the risks, benefits and alternatives for the proposed anesthesia with the patient or authorized representative who has indicated his/her understanding and acceptance.     Dental advisory given  Plan Discussed with: CRNA  Anesthesia Plan Comments:         Anesthesia Quick Evaluation

## 2022-06-18 ENCOUNTER — Observation Stay (HOSPITAL_COMMUNITY): Payer: 59

## 2022-06-18 ENCOUNTER — Encounter (HOSPITAL_COMMUNITY): Payer: Self-pay | Admitting: Orthopaedic Surgery

## 2022-06-18 ENCOUNTER — Ambulatory Visit (HOSPITAL_COMMUNITY): Payer: 59 | Admitting: Anesthesiology

## 2022-06-18 ENCOUNTER — Other Ambulatory Visit: Payer: Self-pay

## 2022-06-18 ENCOUNTER — Encounter (HOSPITAL_COMMUNITY)
Admission: RE | Disposition: A | Discharge status: Home Health | Payer: Self-pay | Source: Home / Self Care | Attending: Orthopaedic Surgery

## 2022-06-18 ENCOUNTER — Observation Stay (HOSPITAL_COMMUNITY)
Admission: RE | Admit: 2022-06-18 | Discharge: 2022-06-19 | Disposition: A | Discharge status: Home Health | Payer: 59 | Attending: Orthopaedic Surgery | Admitting: Orthopaedic Surgery

## 2022-06-18 ENCOUNTER — Ambulatory Visit (HOSPITAL_BASED_OUTPATIENT_CLINIC_OR_DEPARTMENT_OTHER): Payer: 59 | Admitting: Anesthesiology

## 2022-06-18 DIAGNOSIS — Z79899 Other long term (current) drug therapy: Secondary | ICD-10-CM | POA: Diagnosis not present

## 2022-06-18 DIAGNOSIS — Z96652 Presence of left artificial knee joint: Secondary | ICD-10-CM

## 2022-06-18 DIAGNOSIS — I10 Essential (primary) hypertension: Secondary | ICD-10-CM

## 2022-06-18 DIAGNOSIS — Z01818 Encounter for other preprocedural examination: Secondary | ICD-10-CM

## 2022-06-18 DIAGNOSIS — Z7982 Long term (current) use of aspirin: Secondary | ICD-10-CM | POA: Insufficient documentation

## 2022-06-18 DIAGNOSIS — Z6835 Body mass index (BMI) 35.0-35.9, adult: Secondary | ICD-10-CM | POA: Diagnosis not present

## 2022-06-18 DIAGNOSIS — M1712 Unilateral primary osteoarthritis, left knee: Secondary | ICD-10-CM | POA: Diagnosis present

## 2022-06-18 DIAGNOSIS — Z8673 Personal history of transient ischemic attack (TIA), and cerebral infarction without residual deficits: Secondary | ICD-10-CM | POA: Diagnosis not present

## 2022-06-18 DIAGNOSIS — Z23 Encounter for immunization: Secondary | ICD-10-CM | POA: Diagnosis not present

## 2022-06-18 HISTORY — PX: TOTAL KNEE ARTHROPLASTY: SHX125

## 2022-06-18 LAB — CBC
HCT: 36.4 % — ABNORMAL LOW (ref 39.0–52.0)
Hemoglobin: 12.7 g/dL — ABNORMAL LOW (ref 13.0–17.0)
MCH: 33.9 pg (ref 26.0–34.0)
MCHC: 34.9 g/dL (ref 30.0–36.0)
MCV: 97.1 fL (ref 80.0–100.0)
Platelets: 242 10*3/uL (ref 150–400)
RBC: 3.75 MIL/uL — ABNORMAL LOW (ref 4.22–5.81)
RDW: 12.1 % (ref 11.5–15.5)
WBC: 7.7 10*3/uL (ref 4.0–10.5)
nRBC: 0 % (ref 0.0–0.2)

## 2022-06-18 SURGERY — ARTHROPLASTY, KNEE, TOTAL
Anesthesia: Spinal | Site: Knee | Laterality: Left

## 2022-06-18 MED ORDER — METHOCARBAMOL 500 MG PO TABS
500.0000 mg | ORAL_TABLET | Freq: Four times a day (QID) | ORAL | Status: DC | PRN
  Administered 2022-06-19: 500 mg via ORAL
  Filled 2022-06-18: qty 1

## 2022-06-18 MED ORDER — DEXAMETHASONE SODIUM PHOSPHATE 10 MG/ML IJ SOLN: INTRAMUSCULAR | Status: DC | PRN

## 2022-06-18 MED ORDER — DEXAMETHASONE SODIUM PHOSPHATE 10 MG/ML IJ SOLN
INTRAMUSCULAR | Status: AC
  Filled 2022-06-18: qty 1

## 2022-06-18 MED ORDER — PROPOFOL 1000 MG/100ML IV EMUL
INTRAVENOUS | Status: AC
  Filled 2022-06-18: qty 100

## 2022-06-18 MED ORDER — BUPIVACAINE-MELOXICAM ER 400-12 MG/14ML IJ SOLN
INTRAMUSCULAR | Status: AC
  Filled 2022-06-18: qty 1

## 2022-06-18 MED ORDER — HYDROCHLOROTHIAZIDE 25 MG PO TABS
25.0000 mg | ORAL_TABLET | Freq: Every day | ORAL | Status: DC
  Administered 2022-06-19: 25 mg via ORAL
  Filled 2022-06-18: qty 1

## 2022-06-18 MED ORDER — PRONTOSAN WOUND IRRIGATION OPTIME
TOPICAL | Status: DC | PRN
  Administered 2022-06-18: 1 via TOPICAL

## 2022-06-18 MED ORDER — MIDAZOLAM HCL 2 MG/2ML IJ SOLN
INTRAMUSCULAR | Status: DC | PRN
  Administered 2022-06-18 (×2): 1 mg via INTRAVENOUS

## 2022-06-18 MED ORDER — CEFAZOLIN SODIUM-DEXTROSE 2-4 GM/100ML-% IV SOLN
2.0000 g | Freq: Four times a day (QID) | INTRAVENOUS | Status: AC
  Administered 2022-06-18 (×2): 2 g via INTRAVENOUS
  Filled 2022-06-18 (×2): qty 100

## 2022-06-18 MED ORDER — BUPIVACAINE-MELOXICAM ER 400-12 MG/14ML IJ SOLN
INTRAMUSCULAR | Status: DC | PRN
  Administered 2022-06-18: 400 mg

## 2022-06-18 MED ORDER — OLMESARTAN MEDOXOMIL-HCTZ 40-25 MG PO TABS: 1.0000 | ORAL_TABLET | Freq: Every day | ORAL | Status: DC

## 2022-06-18 MED ORDER — ACETAMINOPHEN 325 MG PO TABS: 325.0000 mg | ORAL_TABLET | Freq: Four times a day (QID) | ORAL | Status: DC | PRN

## 2022-06-18 MED ORDER — FENTANYL CITRATE (PF) 250 MCG/5ML IJ SOLN
INTRAMUSCULAR | Status: DC | PRN
  Administered 2022-06-18: 50 ug via INTRAVENOUS

## 2022-06-18 MED ORDER — ONDANSETRON HCL 4 MG/2ML IJ SOLN
INTRAMUSCULAR | Status: DC | PRN
  Administered 2022-06-18: 4 mg via INTRAVENOUS

## 2022-06-18 MED ORDER — TRANEXAMIC ACID 1000 MG/10ML IV SOLN
INTRAVENOUS | Status: DC | PRN
  Administered 2022-06-18: 2000 mg via TOPICAL

## 2022-06-18 MED ORDER — ACETAMINOPHEN 500 MG PO TABS
1000.0000 mg | ORAL_TABLET | Freq: Once | ORAL | Status: AC
  Administered 2022-06-18: 1000 mg via ORAL
  Filled 2022-06-18: qty 2

## 2022-06-18 MED ORDER — SODIUM CHLORIDE 0.9 % IR SOLN
Status: DC | PRN
  Administered 2022-06-18: 1000 mL

## 2022-06-18 MED ORDER — LACTATED RINGERS IV SOLN: INTRAVENOUS | Status: DC

## 2022-06-18 MED ORDER — ORAL CARE MOUTH RINSE: 15.0000 mL | Freq: Once | OROMUCOSAL | Status: AC

## 2022-06-18 MED ORDER — CHLORHEXIDINE GLUCONATE 0.12 % MT SOLN
15.0000 mL | Freq: Once | OROMUCOSAL | Status: AC
  Administered 2022-06-18: 15 mL via OROMUCOSAL
  Filled 2022-06-18: qty 15

## 2022-06-18 MED ORDER — ONDANSETRON HCL 4 MG/2ML IJ SOLN
INTRAMUSCULAR | Status: AC
  Filled 2022-06-18: qty 2

## 2022-06-18 MED ORDER — POVIDONE-IODINE 10 % EX SWAB
2.0000 | Freq: Once | CUTANEOUS | Status: AC
  Administered 2022-06-18: 2 via TOPICAL

## 2022-06-18 MED ORDER — MIDAZOLAM HCL 2 MG/2ML IJ SOLN
INTRAMUSCULAR | Status: AC
  Filled 2022-06-18: qty 2

## 2022-06-18 MED ORDER — METOCLOPRAMIDE HCL 5 MG/ML IJ SOLN: 5.0000 mg | Freq: Three times a day (TID) | INTRAMUSCULAR | Status: DC | PRN

## 2022-06-18 MED ORDER — PHENYLEPHRINE HCL-NACL 20-0.9 MG/250ML-% IV SOLN
INTRAVENOUS | Status: DC | PRN
  Administered 2022-06-18: 40 ug/min via INTRAVENOUS

## 2022-06-18 MED ORDER — BUPIVACAINE-EPINEPHRINE (PF) 0.5% -1:200000 IJ SOLN
INTRAMUSCULAR | Status: DC | PRN
  Administered 2022-06-18: 20 mL via PERINEURAL

## 2022-06-18 MED ORDER — METHOCARBAMOL 1000 MG/10ML IJ SOLN: 500.0000 mg | Freq: Four times a day (QID) | INTRAVENOUS | Status: DC | PRN

## 2022-06-18 MED ORDER — CEFADROXIL 500 MG PO CAPS
500.0000 mg | ORAL_CAPSULE | Freq: Two times a day (BID) | ORAL | Status: DC
  Administered 2022-06-19: 500 mg via ORAL
  Filled 2022-06-18 (×2): qty 1

## 2022-06-18 MED ORDER — INFLUENZA VAC SPLIT QUAD 0.5 ML IM SUSY
0.5000 mL | PREFILLED_SYRINGE | INTRAMUSCULAR | Status: AC
  Administered 2022-06-19: 0.5 mL via INTRAMUSCULAR
  Filled 2022-06-18: qty 0.5

## 2022-06-18 MED ORDER — PROPOFOL 500 MG/50ML IV EMUL
INTRAVENOUS | Status: DC | PRN
  Administered 2022-06-18: 100 ug/kg/min via INTRAVENOUS

## 2022-06-18 MED ORDER — BUPIVACAINE IN DEXTROSE 0.75-8.25 % IT SOLN
INTRATHECAL | Status: DC | PRN
  Administered 2022-06-18: 1.8 mL via INTRATHECAL

## 2022-06-18 MED ORDER — OXYCODONE HCL 5 MG PO TABS: 5.0000 mg | ORAL_TABLET | ORAL | Status: DC | PRN

## 2022-06-18 MED ORDER — VANCOMYCIN HCL 1000 MG IV SOLR
INTRAVENOUS | Status: AC
  Filled 2022-06-18: qty 20

## 2022-06-18 MED ORDER — FENTANYL CITRATE (PF) 250 MCG/5ML IJ SOLN
INTRAMUSCULAR | Status: AC
  Filled 2022-06-18: qty 5

## 2022-06-18 MED ORDER — IRBESARTAN 300 MG PO TABS
300.0000 mg | ORAL_TABLET | Freq: Every day | ORAL | Status: DC
  Administered 2022-06-19: 300 mg via ORAL
  Filled 2022-06-18: qty 1

## 2022-06-18 MED ORDER — FERROUS SULFATE 325 (65 FE) MG PO TABS
325.0000 mg | ORAL_TABLET | Freq: Three times a day (TID) | ORAL | Status: DC
  Administered 2022-06-18 – 2022-06-19 (×3): 325 mg via ORAL
  Filled 2022-06-18 (×3): qty 1

## 2022-06-18 MED ORDER — OXYCODONE HCL 5 MG PO TABS: 10.0000 mg | ORAL_TABLET | ORAL | Status: DC | PRN

## 2022-06-18 MED ORDER — HYDROMORPHONE HCL 1 MG/ML IJ SOLN: 0.5000 mg | INTRAMUSCULAR | Status: DC | PRN

## 2022-06-18 MED ORDER — DEXAMETHASONE SODIUM PHOSPHATE 10 MG/ML IJ SOLN
10.0000 mg | Freq: Once | INTRAMUSCULAR | Status: AC
  Administered 2022-06-19: 10 mg via INTRAVENOUS
  Filled 2022-06-18: qty 1

## 2022-06-18 MED ORDER — HYDROMORPHONE HCL 1 MG/ML IJ SOLN
INTRAMUSCULAR | Status: AC
  Filled 2022-06-18: qty 1

## 2022-06-18 MED ORDER — TRANEXAMIC ACID-NACL 1000-0.7 MG/100ML-% IV SOLN
1000.0000 mg | Freq: Once | INTRAVENOUS | Status: AC
  Administered 2022-06-18: 1000 mg via INTRAVENOUS
  Filled 2022-06-18: qty 100

## 2022-06-18 MED ORDER — ASPIRIN 81 MG PO CHEW
81.0000 mg | CHEWABLE_TABLET | Freq: Two times a day (BID) | ORAL | Status: DC
  Administered 2022-06-18 – 2022-06-19 (×2): 81 mg via ORAL
  Filled 2022-06-18 (×2): qty 1

## 2022-06-18 MED ORDER — OXYCODONE HCL ER 10 MG PO T12A
10.0000 mg | EXTENDED_RELEASE_TABLET | Freq: Two times a day (BID) | ORAL | Status: DC
  Administered 2022-06-18 – 2022-06-19 (×3): 10 mg via ORAL
  Filled 2022-06-18 (×3): qty 1

## 2022-06-18 MED ORDER — SODIUM CHLORIDE 0.9 % IV SOLN: INTRAVENOUS | Status: DC

## 2022-06-18 MED ORDER — DOCUSATE SODIUM 100 MG PO CAPS
100.0000 mg | ORAL_CAPSULE | Freq: Two times a day (BID) | ORAL | Status: DC
  Administered 2022-06-18 – 2022-06-19 (×2): 100 mg via ORAL
  Filled 2022-06-18 (×2): qty 1

## 2022-06-18 MED ORDER — MENTHOL 3 MG MT LOZG: 1.0000 | LOZENGE | OROMUCOSAL | Status: DC | PRN

## 2022-06-18 MED ORDER — ONDANSETRON HCL 4 MG/2ML IJ SOLN: 4.0000 mg | Freq: Four times a day (QID) | INTRAMUSCULAR | Status: DC | PRN

## 2022-06-18 MED ORDER — ACETAMINOPHEN 500 MG PO TABS
1000.0000 mg | ORAL_TABLET | Freq: Four times a day (QID) | ORAL | Status: AC
  Administered 2022-06-18 – 2022-06-19 (×4): 1000 mg via ORAL
  Filled 2022-06-18 (×4): qty 2

## 2022-06-18 MED ORDER — METOCLOPRAMIDE HCL 5 MG PO TABS: 5.0000 mg | ORAL_TABLET | Freq: Three times a day (TID) | ORAL | Status: DC | PRN

## 2022-06-18 MED ORDER — 0.9 % SODIUM CHLORIDE (POUR BTL) OPTIME
TOPICAL | Status: DC | PRN
  Administered 2022-06-18: 1000 mL

## 2022-06-18 MED ORDER — KETOROLAC TROMETHAMINE 15 MG/ML IJ SOLN
15.0000 mg | Freq: Four times a day (QID) | INTRAMUSCULAR | Status: AC
  Administered 2022-06-18 – 2022-06-19 (×4): 15 mg via INTRAVENOUS
  Filled 2022-06-18 (×4): qty 1

## 2022-06-18 MED ORDER — TRANEXAMIC ACID-NACL 1000-0.7 MG/100ML-% IV SOLN
1000.0000 mg | INTRAVENOUS | Status: AC
  Administered 2022-06-18: 1000 mg via INTRAVENOUS
  Filled 2022-06-18: qty 100

## 2022-06-18 MED ORDER — PHENOL 1.4 % MT LIQD: 1.0000 | OROMUCOSAL | Status: DC | PRN

## 2022-06-18 MED ORDER — PROPOFOL 10 MG/ML IV BOLUS
INTRAVENOUS | Status: DC | PRN
  Administered 2022-06-18: 30 mg via INTRAVENOUS

## 2022-06-18 MED ORDER — ONDANSETRON HCL 4 MG PO TABS: 4.0000 mg | ORAL_TABLET | Freq: Four times a day (QID) | ORAL | Status: DC | PRN

## 2022-06-18 MED ORDER — HYDROMORPHONE HCL 1 MG/ML IJ SOLN
0.2500 mg | INTRAMUSCULAR | Status: DC | PRN
  Administered 2022-06-18: 0.5 mg via INTRAVENOUS

## 2022-06-18 MED ORDER — CEFAZOLIN IN SODIUM CHLORIDE 3-0.9 GM/100ML-% IV SOLN
3.0000 g | INTRAVENOUS | Status: AC
  Administered 2022-06-18: 3 g via INTRAVENOUS
  Filled 2022-06-18: qty 100

## 2022-06-18 MED ORDER — VANCOMYCIN HCL 1000 MG IV SOLR
INTRAVENOUS | Status: DC | PRN
  Administered 2022-06-18: 1000 mg via TOPICAL

## 2022-06-18 SURGICAL SUPPLY — 88 items
ADH SKN CLS APL DERMABOND .7 (GAUZE/BANDAGES/DRESSINGS) ×1
ALCOHOL 70% 16 OZ (MISCELLANEOUS) ×2 IMPLANT
BAG COUNTER SPONGE SURGICOUNT (BAG) IMPLANT
BAG DECANTER FOR FLEXI CONT (MISCELLANEOUS) ×2 IMPLANT
BAG SPNG CNTER NS LX DISP (BAG)
BANDAGE ESMARK 6X9 LF (GAUZE/BANDAGES/DRESSINGS) IMPLANT
BLADE SAG 18X100X1.27 (BLADE) ×2 IMPLANT
BNDG CMPR 5X3 CHSV STRCH STRL (GAUZE/BANDAGES/DRESSINGS) ×1
BNDG CMPR 9X6 STRL LF SNTH (GAUZE/BANDAGES/DRESSINGS) ×1
BNDG COHESIVE 3X5 TAN ST LF (GAUZE/BANDAGES/DRESSINGS) IMPLANT
BNDG ESMARK 6X9 LF (GAUZE/BANDAGES/DRESSINGS) ×1
BOWL SMART MIX CTS (DISPOSABLE) ×2 IMPLANT
CLSR STERI-STRIP ANTIMIC 1/2X4 (GAUZE/BANDAGES/DRESSINGS) ×4 IMPLANT
COMP FEM CR PS 12 LT (Joint) ×1 IMPLANT
COMP PATELLAR 10X35 METAL (Joint) ×1 IMPLANT
COMP TIB PS G 0D LT (Joint) ×1 IMPLANT
COMPONENT FEM CR PS 12 LT (Joint) IMPLANT
COMPONENT PATELLAR 10X35 METAL (Joint) IMPLANT
COMPONET TIB PS G 0D LT (Joint) IMPLANT
COOLER ICEMAN CLASSIC (MISCELLANEOUS) ×2 IMPLANT
COVER SURGICAL LIGHT HANDLE (MISCELLANEOUS) ×2 IMPLANT
CUFF TOURN SGL QUICK 34 (TOURNIQUET CUFF) ×1
CUFF TOURN SGL QUICK 42 (TOURNIQUET CUFF) IMPLANT
CUFF TRNQT CYL 34X4.125X (TOURNIQUET CUFF) ×2 IMPLANT
DERMABOND ADVANCED .7 DNX12 (GAUZE/BANDAGES/DRESSINGS) ×2 IMPLANT
DRAPE EXTREMITY T 121X128X90 (DISPOSABLE) ×2 IMPLANT
DRAPE HALF SHEET 40X57 (DRAPES) ×2 IMPLANT
DRAPE INCISE IOBAN 66X45 STRL (DRAPES) ×2 IMPLANT
DRAPE ORTHO SPLIT 77X108 STRL (DRAPES) ×2
DRAPE POUCH INSTRU U-SHP 10X18 (DRAPES) ×2 IMPLANT
DRAPE SURG ORHT 6 SPLT 77X108 (DRAPES) ×4 IMPLANT
DRAPE U-SHAPE 47X51 STRL (DRAPES) ×4 IMPLANT
DRESSING AQUACEL AG SP 3.5X10 (GAUZE/BANDAGES/DRESSINGS) IMPLANT
DRSG AQUACEL AG ADV 3.5X10 (GAUZE/BANDAGES/DRESSINGS) ×2 IMPLANT
DRSG AQUACEL AG SP 3.5X10 (GAUZE/BANDAGES/DRESSINGS) ×1
DURAPREP 26ML APPLICATOR (WOUND CARE) ×6 IMPLANT
ELECT CAUTERY BLADE 6.4 (BLADE) ×2 IMPLANT
ELECT REM PT RETURN 9FT ADLT (ELECTROSURGICAL) ×1
ELECTRODE REM PT RTRN 9FT ADLT (ELECTROSURGICAL) ×2 IMPLANT
GLOVE BIOGEL PI IND STRL 7.0 (GLOVE) ×4 IMPLANT
GLOVE BIOGEL PI IND STRL 7.5 (GLOVE) ×10 IMPLANT
GLOVE ECLIPSE 7.0 STRL STRAW (GLOVE) ×6 IMPLANT
GLOVE SKINSENSE STRL SZ7.5 (GLOVE) ×6 IMPLANT
GLOVE SURG SYN 7.5  E (GLOVE) ×2
GLOVE SURG SYN 7.5 E (GLOVE) ×2 IMPLANT
GLOVE SURG SYN 7.5 PF PI (GLOVE) ×4 IMPLANT
GLOVE SURG UNDER LTX SZ7.5 (GLOVE) ×4 IMPLANT
GLOVE SURG UNDER POLY LF SZ7 (GLOVE) ×4 IMPLANT
GOWN STRL REIN XL XLG (GOWN DISPOSABLE) ×2 IMPLANT
GOWN STRL REUS W/ TWL LRG LVL3 (GOWN DISPOSABLE) ×2 IMPLANT
GOWN STRL REUS W/TWL LRG LVL3 (GOWN DISPOSABLE) ×1
HANDPIECE INTERPULSE COAX TIP (DISPOSABLE) ×1
HDLS TROCR DRIL PIN KNEE 75 (PIN) ×1
HOOD PEEL AWAY FLYTE STAYCOOL (MISCELLANEOUS) ×4 IMPLANT
JET LAVAGE IRRISEPT WOUND (IRRIGATION / IRRIGATOR)
KIT BASIN OR (CUSTOM PROCEDURE TRAY) ×2 IMPLANT
KIT TURNOVER KIT B (KITS) ×2 IMPLANT
LAVAGE JET IRRISEPT WOUND (IRRIGATION / IRRIGATOR) ×2 IMPLANT
LINER TIB ASF PS GH/12 LT (Liner) IMPLANT
MANIFOLD NEPTUNE II (INSTRUMENTS) ×2 IMPLANT
MARKER SKIN DUAL TIP RULER LAB (MISCELLANEOUS) ×4 IMPLANT
NDL SPNL 18GX3.5 QUINCKE PK (NEEDLE) ×2 IMPLANT
NEEDLE SPNL 18GX3.5 QUINCKE PK (NEEDLE) ×1 IMPLANT
NS IRRIG 1000ML POUR BTL (IV SOLUTION) ×2 IMPLANT
PACK TOTAL JOINT (CUSTOM PROCEDURE TRAY) ×2 IMPLANT
PAD ARMBOARD 7.5X6 YLW CONV (MISCELLANEOUS) ×4 IMPLANT
PAD COLD SHLDR WRAP-ON (PAD) ×2 IMPLANT
PIN DRILL HDLS TROCAR 75 4PK (PIN) IMPLANT
SAW OSC TIP CART 19.5X105X1.3 (SAW) ×2 IMPLANT
SCREW FEMALE HEX FIX 25X2.5 (ORTHOPEDIC DISPOSABLE SUPPLIES) IMPLANT
SET HNDPC FAN SPRY TIP SCT (DISPOSABLE) ×2 IMPLANT
SOLUTION PRONTOSAN WOUND 350ML (IRRIGATION / IRRIGATOR) IMPLANT
STAPLER VISISTAT 35W (STAPLE) IMPLANT
SUCTION FRAZIER HANDLE 10FR (MISCELLANEOUS) ×1
SUCTION TUBE FRAZIER 10FR DISP (MISCELLANEOUS) ×2 IMPLANT
SUT ETHILON 2 0 FS 18 (SUTURE) IMPLANT
SUT MNCRL AB 3-0 PS2 27 (SUTURE) IMPLANT
SUT VIC AB 0 CT1 27 (SUTURE) ×2
SUT VIC AB 0 CT1 27XBRD ANBCTR (SUTURE) ×4 IMPLANT
SUT VIC AB 1 CTX 27 (SUTURE) ×6 IMPLANT
SUT VIC AB 2-0 CT1 27 (SUTURE) ×5
SUT VIC AB 2-0 CT1 TAPERPNT 27 (SUTURE) ×8 IMPLANT
SYR 50ML LL SCALE MARK (SYRINGE) ×4 IMPLANT
TOWEL GREEN STERILE (TOWEL DISPOSABLE) ×2 IMPLANT
TOWEL GREEN STERILE FF (TOWEL DISPOSABLE) ×2 IMPLANT
TRAY CATH 16FR W/PLASTIC CATH (SET/KITS/TRAYS/PACK) IMPLANT
UNDERPAD 30X36 HEAVY ABSORB (UNDERPADS AND DIAPERS) ×2 IMPLANT
YANKAUER SUCT BULB TIP NO VENT (SUCTIONS) ×4 IMPLANT

## 2022-06-18 NOTE — Anesthesia Procedure Notes (Signed)
Anesthesia Regional Block: Adductor canal block   Pre-Anesthetic Checklist: , timeout performed,  Correct Patient, Correct Site, Correct Laterality,  Correct Procedure, Correct Position, site marked,  Risks and benefits discussed,  Pre-op evaluation,  At surgeon's request and post-op pain management  Laterality: Left  Prep: Maximum Sterile Barrier Precautions used, chloraprep       Needles:  Injection technique: Single-shot  Needle Type: Echogenic Stimulator Needle     Needle Length: 9cm  Needle Gauge: 21     Additional Needles:   Procedures:,,,, ultrasound used (permanent image in chart),,    Narrative:  Start time: 06/18/2022 6:54 AM End time: 06/18/2022 7:04 AM Injection made incrementally with aspirations every 5 mL.  Performed by: Personally  Anesthesiologist: Roderic Palau, MD

## 2022-06-18 NOTE — Anesthesia Postprocedure Evaluation (Signed)
Anesthesia Post Note  Patient: Sean Lee  Procedure(s) Performed: LEFT TOTAL KNEE ARTHROPLASTY (Left: Knee)     Patient location during evaluation: PACU Anesthesia Type: Spinal and Regional Level of consciousness: oriented and awake and alert Pain management: pain level controlled Vital Signs Assessment: post-procedure vital signs reviewed and stable Respiratory status: spontaneous breathing and respiratory function stable Cardiovascular status: blood pressure returned to baseline and stable Postop Assessment: no headache, no backache, no apparent nausea or vomiting, spinal receding and patient able to bend at knees Anesthetic complications: no  No notable events documented.  Last Vitals:  Vitals:   06/18/22 1145 06/18/22 1200  BP: 99/69 (!) 131/90  Pulse: 66 75  Resp: 16 15  Temp:  36.7 C  SpO2: 99% 100%    Last Pain:  Vitals:   06/18/22 1228  TempSrc:   PainSc: 4                  Providence Stivers,W. EDMOND

## 2022-06-18 NOTE — Anesthesia Procedure Notes (Signed)
Spinal  Patient location during procedure: OR Start time: 06/18/2022 8:05 AM End time: 06/18/2022 8:10 AM Reason for block: surgical anesthesia Staffing Performed: anesthesiologist  Anesthesiologist: Roderic Palau, MD Performed by: Roderic Palau, MD Authorized by: Roderic Palau, MD   Preanesthetic Checklist Completed: patient identified, IV checked, risks and benefits discussed, surgical consent, monitors and equipment checked, pre-op evaluation and timeout performed Spinal Block Patient position: sitting Prep: DuraPrep Patient monitoring: cardiac monitor, continuous pulse ox and blood pressure Approach: midline Location: L3-4 Injection technique: single-shot Needle Needle type: Pencan  Needle gauge: 24 G Needle length: 9 cm Assessment Sensory level: T8 Events: CSF return Additional Notes Functioning IV was confirmed and monitors were applied. Sterile prep and drape, including hand hygiene and sterile gloves were used. The patient was positioned and the spine was prepped. The skin was anesthetized with lidocaine.  Free flow of clear CSF was obtained prior to injecting local anesthetic into the CSF.  The spinal needle aspirated freely following injection.  The needle was carefully withdrawn.  The patient tolerated the procedure well.

## 2022-06-18 NOTE — Discharge Instructions (Signed)

## 2022-06-18 NOTE — Plan of Care (Signed)
  Problem: Activity: Goal: Ability to avoid complications of mobility impairment will improve Outcome: Progressing   Problem: Pain Management: Goal: Pain level will decrease with appropriate interventions Outcome: Progressing   Problem: Skin Integrity: Goal: Will show signs of wound healing Outcome: Progressing   Problem: Activity: Goal: Risk for activity intolerance will decrease Outcome: Progressing   Problem: Coping: Goal: Level of anxiety will decrease Outcome: Progressing

## 2022-06-18 NOTE — Transfer of Care (Signed)
Immediate Anesthesia Transfer of Care Note  Patient: Sean Lee  Procedure(s) Performed: LEFT TOTAL KNEE ARTHROPLASTY (Left: Knee)  Patient Location: PACU  Anesthesia Type:Spinal  Level of Consciousness: awake, alert , and oriented  Airway & Oxygen Therapy: Patient Spontanous Breathing  Post-op Assessment: Report given to RN, Post -op Vital signs reviewed and stable, and Patient able to stick tongue midline  Post vital signs: Reviewed  Last Vitals:  Vitals Value Taken Time  BP 115/74   Temp 97.7   Pulse 83 06/18/22 1024  Resp 15   SpO2 94 % 06/18/22 1024  Vitals shown include unvalidated device data.  Last Pain:  Vitals:   06/18/22 0644  TempSrc:   PainSc: 3       Patients Stated Pain Goal: 1 (07/62/26 3335)  Complications: No notable events documented.

## 2022-06-18 NOTE — H&P (Signed)
PREOPERATIVE H&P  Chief Complaint: left knee degenerative joint disease  HPI: Sean Lee is a 61 y.o. male who presents for surgical treatment of left knee degenerative joint disease.  He denies any changes in medical history.  Past Medical History:  Diagnosis Date   Contact dermatitis and other eczema due to plants (except food)    H/O: CVA (cardiovascular accident)    Carotid doppler neg (4/08)   Hematuria    Obesity, unspecified    Other and unspecified hyperlipidemia    Hx - no medication/diet controll and weight loss   Sacroiliitis, not elsewhere classified ([place])    Sleep apnea    uses CPAP but not every night   Unspecified essential hypertension    Varicose veins of left lower extremity    Past Surgical History:  Procedure Laterality Date   Carotid dopplers  4/08   Negative   HERNIA REPAIR     umbilical   Social History   Socioeconomic History   Marital status: Married    Spouse name: Not on file   Number of children: 2   Years of education: Not on file   Highest education level: Not on file  Occupational History   Occupation: Truck Education administrator: [employer]  Tobacco Use   Smoking status: Never   Smokeless tobacco: Never  Substance and Sexual Activity   Alcohol use: Yes    Alcohol/week: 4.0 standard drinks of alcohol    Types: 4 Cans of beer per week    Comment: weekends   Drug use: No   Sexual activity: Not on file  Other Topics Concern   Not on file  Social History Narrative   Not on file   Social Determinants of Health   Financial Resource Strain: Not on file  Food Insecurity: Not on file  Transportation Needs: Not on file  Physical Activity: Not on file  Stress: Not on file  Social Connections: Not on file   Family History  Problem Relation Age of Onset   Hypertension Mother    Diabetes Mother    Hyperlipidemia Unknown        in family    Cancer Neg Hx    Esophageal cancer Neg Hx    Rectal cancer Neg Hx    Stomach cancer  Neg Hx    Colon cancer Neg Hx    Allergies  Allergen Reactions   Benazepril Hcl Cough   Prior to Admission medications   Medication Sig Start Date End Date Taking? Authorizing Provider  latanoprost (XALATAN) 0.005 % ophthalmic solution Place 1 drop into both eyes at bedtime. 08/16/18  Yes [provider]  meloxicam (MOBIC) 7.5 MG tablet Take 7.5 mg by mouth daily as needed for pain.   Yes [provider]  aspirin EC 81 MG tablet Take 1 tablet (81 mg total) by mouth 2 (two) times daily for 84 doses. To be taken after surgery to prevent blood clots 06/12/22 07/24/22  Aundra Dubin, PA-C  cefadroxil (DURICEF) 500 MG capsule Take 1 capsule (500 mg total) by mouth 2 (two) times daily. To be taken after surgery 06/12/22   Aundra Dubin, PA-C  diclofenac Sodium (PENNSAID) 2 % SOLN Apply 2 Pump (40 mg total) topically 2 (two) times daily as needed (to affected area). Patient not taking: Reported on 06/08/2022 05/21/22   Leandrew Koyanagi, MD  diclofenac Sodium (VOLTAREN) 1 % GEL Apply 2 g topically 4 (four) times daily. Patient not taking:  Reported on 06/08/2022 02/07/22   Kommor, Debe Coder, MD  docusate sodium (COLACE) 100 MG capsule Take 1 capsule (100 mg total) by mouth daily as needed. 06/12/22 06/12/23  Aundra Dubin, PA-C  methocarbamol (ROBAXIN-750) 750 MG tablet Take 1 tablet (750 mg total) by mouth 2 (two) times daily as needed for muscle spasms. To be taken after surgery 06/12/22   Aundra Dubin, PA-C  olmesartan-hydrochlorothiazide (BENICAR HCT) 40-25 MG tablet TAKE 1 TABLET DAILY 06/15/22   Tower, Wynelle Fanny, MD  ondansetron (ZOFRAN) 4 MG tablet Take 1 tablet (4 mg total) by mouth every 8 (eight) hours as needed for nausea or vomiting. 06/12/22   Aundra Dubin, PA-C  oxyCODONE-acetaminophen (PERCOCET) 5-325 MG tablet Take 1-2 tablets by mouth every 6 (six) hours as needed. To be taken after surgery 06/12/22   Aundra Dubin, PA-C  sildenafil (VIAGRA) 50 MG tablet TAKE  1 TABLET (50 MG TOTAL) BY MOUTH DAILY AS NEEDED FOR ERECTILE DYSFUNCTION. [place] ACTIVITY 01/08/22   Tower, Wynelle Fanny, MD     Positive ROS: All other systems have been reviewed and were otherwise negative with the exception of those mentioned in the HPI and as above.  Physical Exam: General: Alert, no acute distress Cardiovascular: No pedal edema Respiratory: No cyanosis, no use of accessory musculature GI: abdomen soft Skin: No lesions in the area of chief complaint Neurologic: Sensation intact distally Psychiatric: Patient is competent for consent with normal mood and affect Lymphatic: no lymphedema  MUSCULOSKELETAL: exam stable  Assessment: left knee degenerative joint disease  Plan: Plan for Procedure(s): LEFT TOTAL KNEE ARTHROPLASTY  The risks benefits and alternatives were discussed with the patient including but not limited to the risks of nonoperative treatment, versus surgical intervention including infection, bleeding, nerve injury,  blood clots, cardiopulmonary complications, morbidity, mortality, among others, and they were willing to proceed.   Eduard Roux, MD 06/18/2022 6:10 AM

## 2022-06-18 NOTE — Evaluation (Signed)
Physical Therapy Evaluation Patient Details Name: Sean Lee MRN: [id number] DOB: 03/14/1961 Today's Date: 06/18/2022  History of Present Illness  61 y.o. male presents to [facility] hospital on 06/18/2022 for elevtice L TKA. PMH includes CVA, OSA.  Clinical Impression  Pt presents to PT with deficits in ROM, strength, power, functional mobility, balance, endurance. Pt is able to ambulate for limited household distances at this time, needing verbal cues for bed mobility and transfers, but overall doing well considering L TKA this morning. Pt will benefit from frequent mobilization in an effort to improve L knee strength and ROM. PT will continue to follow acutely for further gait and stair training.       Recommendations for follow up therapy are one component of a multi-disciplinary discharge planning process, led by the attending physician.  Recommendations may be updated based on patient status, additional functional criteria and insurance authorization.  Follow Up Recommendations Follow physician's recommendations for discharge plan and follow up therapies      Assistance Recommended at Discharge Intermittent Supervision/Assistance  Patient can return home with the following  A little help with walking and/or transfers;A little help with bathing/dressing/bathroom;Assistance with cooking/housework;Assist for transportation;Help with stairs or ramp for entrance    Equipment Recommendations None recommended by PT (RW delivered to room)  Recommendations for Other Services       Functional Status Assessment Patient has had a recent decline in their functional status and demonstrates the ability to make significant improvements in function in a reasonable and predictable amount of time.     Precautions / Restrictions Precautions Precautions: Fall;Knee Precaution Booklet Issued: No Restrictions Weight Bearing Restrictions: Yes LLE Weight Bearing: Weight bearing as tolerated       Mobility  Bed Mobility Overal bed mobility: Needs Assistance Bed Mobility: Supine to Sit, Sit to Supine     Supine to sit: Supervision Sit to supine: Supervision   General bed mobility comments: use of rails sup to sit, PT cues to hook RLE under LLE to return to bed    Transfers Overall transfer level: Needs assistance Equipment used: Rolling walker (2 wheels) Transfers: Sit to/from Stand Sit to Stand: Min guard, From elevated surface                Ambulation/Gait Ambulation/Gait assistance: Min guard Gait Distance (Feet): 80 Feet Assistive device: Rolling walker (2 wheels) Gait Pattern/deviations: Step-to pattern, Decreased stance time - left Gait velocity: reduced Gait velocity interpretation: <1.31 ft/sec, indicative of household ambulator   General Gait Details: pt with slowed step-to gait, reduced stance time on LLE  Stairs            Wheelchair Mobility    Modified Rankin (Stroke Patients Only)       Balance Overall balance assessment: Needs assistance Sitting-balance support: No upper extremity supported, Feet supported Sitting balance-Leahy Scale: Good     Standing balance support: Single extremity supported, Reliant on assistive device for balance Standing balance-Leahy Scale: Poor                               Pertinent Vitals/Pain Pain Assessment Pain Assessment: 0-10 Pain Score: 4  Pain Location: L knee Pain Descriptors / Indicators: Aching Pain Intervention(s): Monitored during session    Home Living Family/patient expects to be discharged to:: Private residence Living Arrangements: Spouse/significant other;Other relatives Available Help at Discharge: Family;Available 24 hours/day;Neighbor Type of Home: House Home Access: Stairs to enter Entrance Stairs-Rails:  None Entrance Stairs-Number of Steps: 2   Home Layout: Two level;Able to live on main level with bedroom/bathroom Home Equipment: Rolling Walker (2  wheels);Cane - single point      Prior Function Prior Level of Function : Independent/Modified Independent;Working/employed             Mobility Comments: works as a Artist Upper Extremity Assessment: Overall WFL for tasks assessed    Lower Extremity Assessment Lower Extremity Assessment: LLE deficits/detail LLE Deficits / Details: LLE 3+/5 PF/DF, 3-/5 knee extension, 3/5 hip flexion    Cervical / Trunk Assessment Cervical / Trunk Assessment: Normal  Communication   Communication: No difficulties  Cognition Arousal/Alertness: Awake/alert Behavior During Therapy: WFL for tasks assessed/performed Overall Cognitive Status: Within Functional Limits for tasks assessed                                          General Comments General comments (skin integrity, edema, etc.): VSS on RA    Exercises     Assessment/Plan    PT Assessment Patient needs continued PT services  PT Problem List Decreased strength;Decreased range of motion;Decreased activity tolerance;Decreased mobility;Decreased balance       PT Treatment Interventions DME instruction;Gait training;Functional mobility training;Stair training;Therapeutic activities;Therapeutic exercise;Balance training;Neuromuscular re-education;Patient/family education    PT Goals (Current goals can be found in the Care Plan section)  Acute Rehab PT Goals Patient Stated Goal: to go home PT Goal Formulation: With patient Time For Goal Achievement: 06/22/22 Potential to Achieve Goals: Good    Frequency 7X/week     Co-evaluation               AM-PAC PT "6 Clicks" Mobility  Outcome Measure Help needed turning from your back to your side while in a flat bed without using bedrails?: A Little Help needed moving from lying on your back to sitting on the side of a flat bed without using bedrails?: A Little Help  needed moving to and from a bed to a chair (including a wheelchair)?: A Little Help needed standing up from a chair using your arms (e.g., wheelchair or bedside chair)?: A Little Help needed to walk in hospital room?: A Little Help needed climbing 3-5 steps with a railing? : Total 6 Click Score: 16    End of Session   Activity Tolerance: Patient tolerated treatment well Patient left: in bed;with call bell/phone within reach;with bed alarm set Nurse Communication: Mobility status PT Visit Diagnosis: Other abnormalities of gait and mobility (R26.89);Muscle weakness (generalized) (M62.81);Pain Pain - Right/Left: Left Pain - part of body: Knee    Time: 2841-3244 PT Time Calculation (min) (ACUTE ONLY): 27 min   Charges:   PT Evaluation $PT Eval Low Complexity: 1 Low          Zenaida Niece, PT, DPT Acute Rehabilitation Office [phone]   Zenaida Niece 06/18/2022, 4:55 PM

## 2022-06-18 NOTE — Op Note (Signed)
Total Knee Arthroplasty Procedure Note  Preoperative diagnosis: Left knee osteoarthritis  Postoperative diagnosis:same  Operative findings: Complete loss of articular cartilage from medial, patellofemoral compartments Good bone quality Varus deformity  Operative procedure: Left total knee arthroplasty. CPT [phone]  Surgeon: N. Eduard Roux, MD  Assist: Madalyn Rob, PA-C; necessary for the timely completion of procedure and due to complexity of procedure.  Anesthesia: Spinal, regional  Tourniquet time: see anesthesia record  Implants used: Zimmer persona pressfit Femur: CR 12 Tibia: G Patella: 35 mm Polyethylene: 12 mm, MC  Indication: Sean Lee is a 61 y.o. year old male with a history of knee pain. Having failed conservative management, the patient elected to proceed with a total knee arthroplasty.  We have reviewed the risk and benefits of the surgery and they elected to proceed after voicing understanding.  Procedure:  After informed consent was obtained and understanding of the risk were voiced including but not limited to bleeding, infection, damage to surrounding structures including nerves and vessels, blood clots, leg length inequality and the failure to achieve desired results, the operative extremity was marked with verbal confirmation of the patient in the holding area.   The patient was then brought to the operating room and transported to the operating room table in the supine position.  A tourniquet was applied to the operative extremity around the upper thigh. The operative limb was then prepped and draped in the usual sterile fashion and preoperative antibiotics were administered.  A time out was performed prior to the start of surgery confirming the correct extremity, preoperative antibiotic administration, as well as team members, implants and instruments available for the case. Correct surgical site was also confirmed with preoperative  radiographs. The limb was then elevated for exsanguination and the tourniquet was inflated. A midline incision was made and a standard medial parapatellar approach was performed.  The patella was prepared and sized to a 35 mm.  A cover was placed on the patella for protection from retractors.  We then turned our attention to the femur. Posterior cruciate ligament was sacrificed. Start site was drilled in the femur and the intramedullary distal femoral cutting guide was placed, set at 5 degrees valgus, taking 10 mm of distal resection. The distal cut was made. Osteophytes were then removed.   Next, the proximal tibial cutting guide was placed with appropriate slope, varus/valgus alignment and depth of resection. The proximal tibial cut was made taking 4 mm off the low side. Gap blocks were then used to assess the extension gap and alignment, and appropriate soft tissue releases were performed. Attention was turned back to the femur, which was sized using the sizing guide to a size 12. Appropriate rotation of the femoral component was determined using epicondylar axis, Whiteside's line, and assessing the flexion gap under ligament tension. The appropriate size 4-in-1 cutting block was placed and cuts were made.  Posterior femoral osteophytes and uncapped bone were then removed with the curved osteotome.  Trial components were placed, and stability was checked in full extension, mid-flexion, and deep flexion. Proper tibial rotation was determined and marked.  The patella tracked well without a lateral release. Trial components were then removed and tibial preparation performed.  The tibia was sized for a size G component.  The bony surfaces were irrigated with a pulse lavage and then dried. Final components were placed.  The stability of the construct was re-evaluated throughout a range of motion and found to be acceptable. The trial liner was  removed, the knee was copiously irrigated, and the knee was  re-evaluated for any excess bone debris. The real polyethylene liner, 12 mm thick, was inserted and checked to ensure the locking mechanism had engaged appropriately. The tourniquet was deflated and hemostasis was achieved. The wound was irrigated with normal saline.  One gram of vancomycin powder was placed in the surgical bed.  Topical 0.25% bupivacaine and meloxicam was placed in the joint for postoperative pain.  Capsular closure was performed with a #1 vicryl, subcutaneous fat closed with a 0 vicryl suture, then subcutaneous tissue closed with interrupted 2.0 vicryl suture. The skin was then closed with a 2.0 nylon and dermabond. A sterile dressing was applied.  The patient was awakened in the operating room and taken to recovery in stable condition. All sponge, needle, and instrument counts were correct at the end of the case.  Sean Lee was necessary for opening, closing, retracting, limb positioning and overall facilitation and completion of the surgery.  Position: supine  Complications: none.  Time Out: performed   Drains/Packing: none  Estimated blood loss: minimal  Returned to Recovery Room: in good condition.   Antibiotics: ancef x 24 hours, cefadroxil x 10 days for diabetes  Mechanical VTE (DVT) Prophylaxis: sequential compression devices, TED thigh-high  Chemical VTE (DVT) Prophylaxis: aspirin  Fluid Replacement  Crystalloid: see anesthesia record Blood: none  FFP: none   Specimens Removed: 1 to pathology   Sponge and Instrument Count Correct? yes   PACU: portable radiograph - knee AP and Lateral   Plan/RTC: Return in 2 weeks for wound check.   Weight Bearing/Load Lower Extremity: full   Implant Name Type Inv. Item Serial No. Manufacturer Lot No. LRB No. Used Action  COMP TIB PS G 0D LT - [id number] Joint COMP TIB PS G 0D LT  ZIMMER RECON(ORTH,TRAU,BIO,SG) [id number] Left 1 Implanted  COMP FEM CR PS 12 LT - [id number] Joint COMP FEM CR PS 12 LT  ZIMMER  RECON(ORTH,TRAU,BIO,SG) [id number] Left 1 Implanted  COMP PATELLAR 10X35 METAL - [id number] Joint COMP PATELLAR 10X35 METAL  ZIMMER RECON(ORTH,TRAU,BIO,SG) [id number] Left 1 Implanted  LINER TIB ASF PS GH/12 LT - [id number] Liner LINER TIB ASF PS GH/12 LT  ZIMMER RECON(ORTH,TRAU,BIO,SG) [id number] Left 1 Implanted    N. Eduard Roux, MD [facility] 9:44 AM

## 2022-06-19 ENCOUNTER — Other Ambulatory Visit: Payer: Self-pay | Admitting: Physician Assistant

## 2022-06-19 ENCOUNTER — Encounter (HOSPITAL_COMMUNITY): Payer: Self-pay | Admitting: Orthopaedic Surgery

## 2022-06-19 DIAGNOSIS — M1712 Unilateral primary osteoarthritis, left knee: Secondary | ICD-10-CM | POA: Diagnosis not present

## 2022-06-19 LAB — CBC
HCT: 33.3 % — ABNORMAL LOW (ref 39.0–52.0)
Hemoglobin: 11.7 g/dL — ABNORMAL LOW (ref 13.0–17.0)
MCH: 33.7 pg (ref 26.0–34.0)
MCHC: 35.1 g/dL (ref 30.0–36.0)
MCV: 96 fL (ref 80.0–100.0)
Platelets: 228 10*3/uL (ref 150–400)
RBC: 3.47 MIL/uL — ABNORMAL LOW (ref 4.22–5.81)
RDW: 11.9 % (ref 11.5–15.5)
WBC: 14.6 10*3/uL — ABNORMAL HIGH (ref 4.0–10.5)
nRBC: 0 % (ref 0.0–0.2)

## 2022-06-19 NOTE — Progress Notes (Signed)
Mobility Specialist Progress Note:   06/19/22 1313  Mobility  Activity Ambulated with assistance in hallway  Level of Assistance Contact guard assist, steadying assist  Assistive Device Front wheel walker  Distance Ambulated (ft) 120 ft  LLE Weight Bearing WBAT  Activity Response Tolerated well  Mobility Referral Yes  $Mobility charge 1 Mobility   Pt received in bed and eager. C/o of tightness and slight pain in LE with ambulation. Pt left sitting EOB with all needs met and call bell in reach.   Charmelle Soh Mobility Specialist-Acute Rehab Secure Chat only

## 2022-06-19 NOTE — TOC Initial Note (Signed)
Transition of Care [facility]) - Initial/Assessment Note    Patient Details  Name: Sean Lee MRN: [id number] Date of Birth: 06-Jul-1961  Transition of [facility]) CM/SW Contact:    Curlene Labrum, RN Phone Number: 06/19/2022, 9:02 AM  Clinical Narrative:                 CM met with the patient at the bedside.  The patient is S/P Left total knee arthroplasty with Dr. Erlinda Hong.  The patient has RW present in the room that was previous ordered preoperatively by Dr. Phoebe Sharps office.  I spoke with the patient and gave him choice regarding home health and he did not have a preference.  I called [facility] and spoke with [place], [place] and patient is set up for home health services.  HH orders in place.  Patient will be discharged home after second session with PT/OT this morning per Orthopedics note.  Expected Discharge Plan: [place] Barriers to Discharge: No Barriers Identified   Patient Goals and CMS Choice Patient states their goals for this hospitalization and ongoing recovery are:: To return home with wife CMS Medicare.gov Compare Post Acute Care list provided to:: Patient Choice offered to / list presented to : Patient  Expected Discharge Plan and Services Expected Discharge Plan: [place]   Discharge Planning Services: CM Consult Post Acute Care Choice: [place] arrangements for the past 2 months: Single Family Home Expected Discharge Date: 06/19/22               DME Arranged: Gilford Rile rolling       Representative spoke with at DME Agency:  (previously set up through Dr. Phoebe Sharps office)     Date [facility] Agency Contacted: 06/19/22 Time [place] Agency Contacted: 0901 Representative spoke with at [place]: [place], [place] with [place] Arrangements/Services Living arrangements for the past 2 months: [place] with:: Spouse Patient language and need for interpreter reviewed:: Yes Do you feel safe going  back to the place where you live?: Yes      Need for Family Participation in Patient Care: Yes (Comment) Care giver support system in place?: Yes (comment)   Criminal Activity/Legal Involvement Pertinent to Current Situation/Hospitalization: No - Comment as needed  Activities of Daily Living Home Assistive Devices/Equipment: CPAP, Cane (specify quad or straight) ADL Screening (condition at time of admission) Patient's cognitive ability adequate to safely complete daily activities?: Yes Is the patient deaf or have difficulty hearing?: No Does the patient have difficulty seeing, even when wearing glasses/contacts?: No Does the patient have difficulty concentrating, remembering, or making decisions?: No Patient able to express need for assistance with ADLs?: Yes Does the patient have difficulty dressing or bathing?: No Independently performs ADLs?: Yes (appropriate for developmental age) Does the patient have difficulty walking or climbing stairs?: No Weakness of Legs: None Weakness of Arms/Hands: None  Permission Sought/Granted Permission sought to share information with : Case Manager Permission granted to share information with : Yes, Verbal Permission Granted     Permission granted to share info w AGENCY: [facility] granted to share info w Relationship: Spouse     Emotional Assessment Appearance:: Appears stated age Attitude/Demeanor/Rapport: Gracious Affect (typically observed): Accepting Orientation: : Oriented to Self, Oriented to Place, Oriented to  Time, Oriented to Situation Alcohol / Substance Use: Not Applicable Psych Involvement: No (comment)  Admission diagnosis:  Status post total  left knee replacement [Z96.652] Patient Active Problem List   Diagnosis Date Noted   Status post total left knee replacement 06/18/2022   Primary osteoarthritis of left knee 01/01/2022   Primary osteoarthritis of right knee 01/01/2022   Left knee pain 12/25/209    Umbilical hernia 17/35/6701   ED (erectile dysfunction) 12/31/2020   Insomnia 04/02/2020   Right ankle swelling 07/25/2019   Gout 11/03/2018   Chronic pain of right ankle 08/31/2018   Routine general medical examination at a health care facility 11/01/2017   Prostate cancer screening 06/28/2017   Alopecia 11/04/2016   Back pain 05/06/2016   Sleep disorder 08/28/2015   Colon cancer screening 08/28/2015   Prediabetes 01/02/2013   Varicosities of leg 03/21/2012   Rectus diastasis of lower abdomen 03/21/2012   Obstructive sleep apnea 10/15/2009   Class 2 severe obesity due to excess calories with serious comorbidity and body mass index (BMI) of 38.0 to 38.9 in adult [facility]) 09/02/2009   Hyperlipidemia 02/02/2007   HEMATURIA, MICROSCOPIC 02/02/2007   Essential hypertension 02/01/2007   CEREBROVASCULAR ACCIDENT, HX OF 02/01/2007   PCP:  Abner Greenspan, MD Pharmacy:   [facility] [place] [place] [facility] [address] [place] [address] Phone: [phone] Fax: [place], [place] [address] [place] [address] Phone: [phone] Fax: [phone]  [id number]: [phone]: [phone]    Social Determinants of Health (SDOH) Interventions    Readmission Risk Interventions     No data to display

## 2022-06-19 NOTE — Progress Notes (Signed)
Physical Therapy Treatment Patient Details Name: Sean Lee MRN: [id number] DOB: [birth year] Today's Date: 06/19/2022   History of Present Illness 61 y.o. male presents to [facility] hospital on 06/18/2022 for elevtice L TKA. PMH includes CVA, OSA.    PT Comments    Good progress towards acute functional goals. Ambulating >100 feet at supervision level with RW. Safely completed stair training similar to home requirements with posterior approach using RW. Pt nauseous at end of session. Back in bed with call bell in place, speaking with CM. Adequate for d/c from mobility standpoint when medically cleared. States wife is not available to assist him at home until after 5pm today when she leaves work. Hopes to stay another night in the hospital for additional nursing care. Will continue to work with pt until d/c.   Recommendations for follow up therapy are one component of a multi-disciplinary discharge planning process, led by the attending physician.  Recommendations may be updated based on patient status, additional functional criteria and insurance authorization.  Follow Up Recommendations  Follow physician's recommendations for discharge plan and follow up therapies     Assistance Recommended at Discharge Intermittent Supervision/Assistance  Patient can return home with the following A little help with walking and/or transfers;A little help with bathing/dressing/bathroom;Assistance with cooking/housework;Assist for transportation;Help with stairs or ramp for entrance   Equipment Recommendations  Rollator (4 wheels) (RW already delivered to room)    Recommendations for Other Services       Precautions / Restrictions Precautions Precautions: Fall;Knee Precaution Booklet Issued: Yes (comment) Precaution Comments: Reviewed precautions, handout provided Restrictions Weight Bearing Restrictions: No LLE Weight Bearing: Weight bearing as tolerated     Mobility  Bed Mobility Overal bed  mobility: Needs Assistance Bed Mobility: Supine to Sit, Sit to Supine     Supine to sit: Supervision Sit to supine: Supervision   General bed mobility comments: supervision for safety, cues for use of RLE to support LLE as needed. More effortful returning to bed, pt feeling nauseous.    Transfers Overall transfer level: Needs assistance Equipment used: Rolling walker (2 wheels) Transfers: Sit to/from Stand Sit to Stand: Supervision           General transfer comment: supervision for safety with sit to stand from bed. No physical assist required, good control upon standing with support from RW.    Ambulation/Gait Ambulation/Gait assistance: Supervision Gait Distance (Feet): 120 Feet Assistive device: Rolling walker (2 wheels) Gait Pattern/deviations: Decreased stance time - left, Step-through pattern, Antalgic Gait velocity: reduced     General Gait Details: Focused on symmetry of gait, progressing well with step-through pattern today. Still a bit antalgic with WB on LLE however with very light use of RW for support and no buckling noted.   Stairs Stairs: Yes Stairs assistance: Min guard Stair Management: No rails, Backwards, With walker, Step to pattern Number of Stairs: 2 General stair comments: safely completed stair training with min guard, to block RW only. Able to verbalize understanding of sequencing, denies further practice.   Wheelchair Mobility    Modified Rankin (Stroke Patients Only)       Balance Overall balance assessment: Needs assistance Sitting-balance support: No upper extremity supported, Feet supported Sitting balance-Leahy Scale: Good     Standing balance support: No upper extremity supported Standing balance-Leahy Scale: Fair                              Cognition Arousal/Alertness: Awake/alert  Behavior During Therapy: WFL for tasks assessed/performed Overall Cognitive Status: Within Functional Limits for tasks assessed                                           Exercises Other Exercises Other Exercises: HEP handout provided    General Comments        Pertinent Vitals/Pain Pain Assessment Pain Assessment: 0-10 Pain Score: 6  Pain Location: left knee pain Pain Descriptors / Indicators: Aching, Discomfort Pain Intervention(s): Monitored during session, Repositioned    Home Living Family/patient expects to be discharged to:: Private residence Living Arrangements: Spouse/significant other;Other relatives Available Help at Discharge: Family;Available 24 hours/day;Neighbor Type of Home: House Home Access: Stairs to enter Entrance Stairs-Rails: None Entrance Stairs-Number of Steps: 2   Home Layout: Two level;Able to live on main level with bedroom/bathroom Home Equipment: Rolling Walker (2 wheels);Cane - single point      Prior Function            PT Goals (current goals can now be found in the care plan section) Acute Rehab PT Goals Patient Stated Goal: to go home PT Goal Formulation: With patient Time For Goal Achievement: 06/22/22 Potential to Achieve Goals: Good Progress towards PT goals: Progressing toward goals    Frequency    7X/week      PT Plan Current plan remains appropriate    Co-evaluation              AM-PAC PT "6 Clicks" Mobility   Outcome Measure  Help needed turning from your back to your side while in a flat bed without using bedrails?: A Little Help needed moving from lying on your back to sitting on the side of a flat bed without using bedrails?: A Little Help needed moving to and from a bed to a chair (including a wheelchair)?: A Little Help needed standing up from a chair using your arms (e.g., wheelchair or bedside chair)?: A Little Help needed to walk in hospital room?: A Little Help needed climbing 3-5 steps with a railing? : A Little 6 Click Score: 18    End of Session Equipment Utilized During Treatment: Gait belt Activity  Tolerance: Patient tolerated treatment well Patient left: in bed;with call bell/phone within reach;with bed alarm set Nurse Communication: Mobility status PT Visit Diagnosis: Other abnormalities of gait and mobility (R26.89);Muscle weakness (generalized) (M62.81);Pain Pain - Right/Left: Left Pain - part of body: Knee     Time: 8032-1224 PT Time Calculation (min) (ACUTE ONLY): 16 min  Charges:  $Gait Training: 8-22 mins                     Candie Mile, PT, DPT Physical Therapist Acute Rehabilitation Services [facility]  Ellouise Newer 06/19/2022, 11:57 AM

## 2022-06-19 NOTE — Progress Notes (Cosign Needed)
    Durable Medical Equipment  (From admission, onward)           Start     Ordered   06/19/22 0940  For home use only DME 3 n 1  Once        06/19/22 0939   06/18/22 1227  DME Walker rolling  Once       Question Answer Comment  Walker: With 5 Inch Wheels   Patient needs a walker to treat with the following condition Status post left partial knee replacement      06/18/22 1226   06/18/22 1227  DME 3 n 1  Once        06/18/22 1226   06/18/22 1227  DME Bedside commode  Once       Question:  Patient needs a bedside commode to treat with the following condition  Answer:  Status post left partial knee replacement   06/18/22 1226

## 2022-06-19 NOTE — Progress Notes (Signed)
Subjective: 1 Day Post-Op Procedure(s) (LRB): LEFT TOTAL KNEE ARTHROPLASTY (Left) Patient reports pain as mild.    Objective: Vital signs in last 24 hours: Temp:  [97.6 F (36.4 C)-98.7 F (37.1 C)] 98.7 F (37.1 C) (11/03 0452) Pulse Rate:  [62-85] 79 (11/03 0452) Resp:  [13-19] 18 (11/03 0452) BP: (99-137)/(60-90) 137/75 (11/03 0452) SpO2:  [92 %-100 %] 100 % (11/03 0452)  Intake/Output from previous day: 11/02 0701 - 11/03 0700 In: 1877 [P.O.:360; I.V.:1217; IV Piggyback:300] Out: 985 [Urine:975; Blood:10] Intake/Output this shift: No intake/output data recorded.  Recent Labs    06/18/22 0642 06/19/22 0347  HGB 12.7* 11.7*   Recent Labs    06/18/22 0642 06/19/22 0347  WBC 7.7 14.6*  RBC 3.75* 3.47*  HCT 36.4* 33.3*  PLT 242 228   No results for input(s): "NA", "K", "CL", "CO2", "BUN", "CREATININE", "GLUCOSE", "CALCIUM" in the last 72 hours. No results for input(s): "LABPT", "INR" in the last 72 hours.  Neurologically intact Neurovascular intact Sensation intact distally Intact pulses distally Dorsiflexion/Plantar flexion intact Incision: dressing C/D/I No cellulitis present Compartment soft   Assessment/Plan: 1 Day Post-Op Procedure(s) (LRB): LEFT TOTAL KNEE ARTHROPLASTY (Left) Advance diet Up with therapy D/C IV fluids Discharge home with home health once cleared by PT (likely after second session) WBAT LLE ABLA- mild and stable   Anticipated LOS equal to or greater than 2 midnights due to - Age 61 and older with one or more of the following:  - Obesity  - Expected need for hospital services (PT, OT, Nursing) required for safe  discharge  - Anticipated need for postoperative skilled nursing care or inpatient rehab  - Active co-morbidities: Stroke OR   - Unanticipated findings during/Post Surgery: None  - Patient is a high risk of re-admission due to: None   Aundra Dubin 06/19/2022, 7:30 AM

## 2022-06-19 NOTE — Discharge Summary (Signed)
Patient ID: Sean Lee MRN: [id number] DOB/AGE: 09-06-1960 61 y.o.  Admit date: 06/18/2022 Discharge date: 06/19/2022  Admission Diagnoses:  Principal Problem:   Primary osteoarthritis of left knee Active Problems:   Status post total left knee replacement   Discharge Diagnoses:  Same  Past Medical History:  Diagnosis Date   Contact dermatitis and other eczema due to plants (except food)    H/O: CVA (cardiovascular accident)    Carotid doppler neg (4/08)   Hematuria    Obesity, unspecified    Other and unspecified hyperlipidemia    Hx - no medication/diet controll and weight loss   Sacroiliitis, not elsewhere classified ([place])    Sleep apnea    uses CPAP but not every night   Unspecified essential hypertension    Varicose veins of left lower extremity     Surgeries: Procedure(s): LEFT TOTAL KNEE ARTHROPLASTY on 06/18/2022   Consultants:   Discharged Condition: Improved  Hospital Course: Sean Lee is an 61 y.o. male who was admitted 06/18/2022 for operative treatment ofPrimary osteoarthritis of left knee. Patient has severe unremitting pain that affects sleep, daily activities, and work/hobbies. After pre-op clearance the patient was taken to the operating room on 06/18/2022 and underwent  Procedure(s): LEFT TOTAL KNEE ARTHROPLASTY.    Patient was given perioperative antibiotics:  Anti-infectives (From admission, onward)    Start     Dose/Rate Route Frequency Ordered Stop   06/19/22 1000  cefadroxil (DURICEF) capsule 500 mg       Note to Pharmacy: To be taken after surgery     500 mg Oral 2 times daily 06/18/22 1226     06/18/22 1400  ceFAZolin (ANCEF) IVPB 2g/100 mL premix        2 g 200 mL/hr over 30 Minutes Intravenous Every 6 hours 06/18/22 1226 06/18/22 2024   06/18/22 0852  vancomycin (VANCOCIN) powder  Status:  Discontinued          As needed 06/18/22 0852 06/18/22 1020   06/18/22 0645  ceFAZolin (ANCEF) IVPB 3g/100 mL premix        3 g 200 mL/hr over  30 Minutes Intravenous On call to O.R. 06/18/22 0630 06/18/22 0102        Patient was given sequential compression devices, early ambulation, and chemoprophylaxis to prevent DVT.  Patient benefited maximally from hospital stay and there were no complications.    Recent vital signs: Patient Vitals for the past 24 hrs:  BP Temp Temp src Pulse Resp SpO2  06/19/22 0452 137/75 98.7 F (37.1 C) Oral 79 18 100 %  06/18/22 2350 125/74 97.6 F (36.4 C) Oral 62 16 96 %  06/18/22 1921 128/80 98.5 F (36.9 C) Oral 74 18 97 %  06/18/22 1606 121/60 98 F (36.7 C) Oral 78 15 --  06/18/22 1240 128/80 98 F (36.7 C) -- 80 15 --  06/18/22 1200 (!) 131/90 98 F (36.7 C) -- 75 15 100 %  06/18/22 1145 99/69 -- -- 66 16 99 %  06/18/22 1130 124/85 -- -- 66 13 98 %  06/18/22 1115 125/80 -- -- 72 15 100 %  06/18/22 1100 116/72 -- -- 71 19 100 %  06/18/22 1045 113/84 -- -- 73 19 98 %  06/18/22 1030 105/77 -- -- 79 14 96 %  06/18/22 1025 115/74 97.7 F (36.5 C) -- 85 17 93 %  06/18/22 0759 -- -- -- 74 -- 94 %  06/18/22 0758 -- -- -- 72 -- 96 %  06/18/22 0757 -- -- -- 76 -- 96 %  06/18/22 0756 -- -- -- 77 -- 92 %  06/18/22 0755 117/73 -- -- 77 -- 94 %  06/18/22 0754 -- -- -- 70 -- 97 %  06/18/22 0753 -- -- -- 70 -- 95 %  06/18/22 0752 -- -- -- 80 -- 95 %  06/18/22 0751 -- -- -- 76 -- 98 %  06/18/22 0750 121/73 -- -- 73 -- 98 %  06/18/22 0749 -- -- -- 78 -- 95 %  06/18/22 0748 -- -- -- 77 -- 96 %  06/18/22 0747 -- -- -- 75 -- 97 %  06/18/22 0746 -- -- -- 77 -- 94 %  06/18/22 0745 124/73 -- -- 83 -- 97 %     Recent laboratory studies:  Recent Labs    06/18/22 0642 06/19/22 0347  WBC 7.7 14.6*  HGB 12.7* 11.7*  HCT 36.4* 33.3*  PLT 242 228     Discharge Medications:   Allergies as of 06/19/2022       Reactions   Benazepril Hcl Cough        Medication List     STOP taking these medications    diclofenac Sodium 1 % Gel Commonly known as: Voltaren   diclofenac Sodium 2 %  Soln Commonly known as: Pennsaid   meloxicam 7.5 MG tablet Commonly known as: MOBIC       TAKE these medications    aspirin EC 81 MG tablet Take 1 tablet (81 mg total) by mouth 2 (two) times daily for 84 doses. To be taken after surgery to prevent blood clots   cefadroxil 500 MG capsule Commonly known as: DURICEF Take 1 capsule (500 mg total) by mouth 2 (two) times daily. To be taken after surgery   docusate sodium 100 MG capsule Commonly known as: Colace Take 1 capsule (100 mg total) by mouth daily as needed.   latanoprost 0.005 % ophthalmic solution Commonly known as: XALATAN Place 1 drop into both eyes at bedtime.   methocarbamol 750 MG tablet Commonly known as: Robaxin-750 Take 1 tablet (750 mg total) by mouth 2 (two) times daily as needed for muscle spasms. To be taken after surgery   olmesartan-hydrochlorothiazide 40-25 MG tablet Commonly known as: BENICAR HCT TAKE 1 TABLET DAILY   ondansetron 4 MG tablet Commonly known as: Zofran Take 1 tablet (4 mg total) by mouth every 8 (eight) hours as needed for nausea or vomiting.   oxyCODONE-acetaminophen 5-325 MG tablet Commonly known as: Percocet Take 1-2 tablets by mouth every 6 (six) hours as needed. To be taken after surgery   sildenafil 50 MG tablet Commonly known as: VIAGRA TAKE 1 TABLET (50 MG TOTAL) BY MOUTH DAILY AS NEEDED FOR ERECTILE DYSFUNCTION. [place]  (From admission, onward)           Start     Ordered   06/18/22 1227  DME Walker rolling  Once       Question Answer Comment  Walker: With 5 Inch Wheels   Patient needs a walker to treat with the following condition Status post left partial knee replacement      06/18/22 1226   06/18/22 1227  DME 3 n 1  Once        06/18/22 1226   06/18/22 1227  DME Bedside commode  Once       Question:  Patient needs a bedside commode to treat with the following condition  Answer:   Status post left partial knee replacement   06/18/22 1226            Diagnostic Studies: DG Knee Left Port  Result Date: 06/18/2022 CLINICAL DATA:  Postop EXAM: PORTABLE LEFT KNEE - 1-2 VIEW COMPARISON:  None Available. FINDINGS: Postsurgical changes from right knee arthroplasty. Evidence of periprosthetic fracture. No loosening. Overlying external material limits the ability to assess for soft tissue abnormality. Within this limitation no focal abnormality is visualized. IMPRESSION: Expected postsurgical changes from right knee arthroplasty. No evidence of acute complications. Electronically Signed   By: Marin Roberts M.D.   On: 06/18/2022 11:32   XR KNEE 3 VIEW LEFT  Result Date: 05/21/2022 Advanced tricompartmental degenerative joint disease.  Bone-on-bone joint space narrowing of medial compartment.  Varus deformity.   Disposition: Discharge disposition: 01-Home or Self Care          Follow-up Information     Leandrew Koyanagi, MD. Schedule an appointment as soon as possible for a visit in 2 week(s).   Specialty: Orthopedic Surgery Contact information: [address] [place] [id number] [phone]                  Signed: Aundra Dubin 06/19/2022, 7:31 AM

## 2022-06-19 NOTE — Evaluation (Signed)
Occupational Therapy Evaluation Patient Details Name: Sean Lee MRN: [id number] DOB: 11-05-1960 Today's Date: 06/19/2022   History of Present Illness 61 y.o. male presents to [facility] hospital on 06/18/2022 for elevtice L TKA. PMH includes CVA, OSA.   Clinical Impression   Pt currently min assist for simulated LB bathing with mod assist for dressing secondary to left knee flexion limitations.  Min guard for transfers to and from the toilet.  He will have assist PRN at home from spouse and neighbor.  Will need 3:1 for home for use over the toilet as well as in the walk-in shower.  No further acute care OT needs or post acute OT recommended.       Recommendations for follow up therapy are one component of a multi-disciplinary discharge planning process, led by the attending physician.  Recommendations may be updated based on patient status, additional functional criteria and insurance authorization.   Follow Up Recommendations  No OT follow up    Assistance Recommended at Discharge PRN  Patient can return home with the following A little help with bathing/dressing/bathroom;Assistance with cooking/housework;Assist for transportation;Help with stairs or ramp for entrance    Functional Status Assessment  Patient has had a recent decline in their functional status and demonstrates the ability to make significant improvements in function in a reasonable and predictable amount of time.  Equipment Recommendations  BSC/3in1       Precautions / Restrictions Precautions Precautions: Fall;Knee Restrictions Weight Bearing Restrictions: No LLE Weight Bearing: Weight bearing as tolerated      Mobility Bed Mobility Overal bed mobility: Needs Assistance Bed Mobility: Supine to Sit, Sit to Supine     Supine to sit: Min assist Sit to supine: Supervision   General bed mobility comments: Min assist for advancing the LLE off of the bed    Transfers Overall transfer level: Needs  assistance Equipment used: Rolling walker (2 wheels) Transfers: Sit to/from Stand Sit to Stand: Min guard, From elevated surface           General transfer comment: Min assist initially from EOB but then progressed to min guard from 3:1      Balance Overall balance assessment: Needs assistance   Sitting balance-Leahy Scale: Good     Standing balance support: Bilateral upper extremity supported Standing balance-Leahy Scale: Poor Standing balance comment: Pt needs UE support on the RW with mobility.                           ADL either performed or assessed with clinical judgement   ADL Overall ADL's : Needs assistance/impaired Eating/Feeding: Independent;Sitting   Grooming: Wash/dry hands;Wash/dry face;Supervision/safety;Standing   Upper Body Bathing: Set up;Sitting   Lower Body Bathing: Minimal assistance;Sit to/from stand   Upper Body Dressing : Set up;Sitting   Lower Body Dressing: Moderate assistance;Sit to/from stand   Toilet Transfer: Min guard;Ambulation;BSC/3in1;Rolling walker (2 wheels)   Toileting- Clothing Manipulation and Hygiene: Min guard;Sit to/from stand   Tub/ Shower Transfer: Walk-in shower;BSC/3in1;Min guard   Functional mobility during ADLs: Min guard;Rolling walker (2 wheels) General ADL Comments: Educated pt on proper techniques for dressing secondary to limitations in flexibility with the LLE.  Also provided education on use of the 3:1 for toileting as well as for the walk-in shower.  Encouraged use of a walker bag or tray at home with therapist providing visual reference as well.  No further acute or post acute OT needs at this time.  Vision Baseline Vision/History: 1 Wears glasses (reading glasses) Ability to See in Adequate Light: 0 Adequate Patient Visual Report: No change from baseline Vision Assessment?: No apparent visual deficits     Perception Perception Perception: Not tested   Praxis Praxis Praxis: Not tested     Pertinent Vitals/Pain Pain Assessment Pain Assessment: 0-10 Pain Score: 6  Pain Location: left knee pain Pain Descriptors / Indicators: Aching, Discomfort Pain Intervention(s): Limited activity within patient's tolerance, Repositioned     Hand Dominance Right   Extremity/Trunk Assessment Upper Extremity Assessment Upper Extremity Assessment: Defer to OT evaluation   Lower Extremity Assessment Lower Extremity Assessment: Defer to PT evaluation   Cervical / Trunk Assessment Cervical / Trunk Assessment: Normal   Communication Communication Communication: No difficulties   Cognition Arousal/Alertness: Awake/alert Behavior During Therapy: WFL for tasks assessed/performed Overall Cognitive Status: Within Functional Limits for tasks assessed                                                  Home Living Family/patient expects to be discharged to:: Private residence Living Arrangements: Spouse/significant other;Other relatives Available Help at Discharge: Family;Available 24 hours/day;Neighbor Type of Home: House Home Access: Stairs to enter [employer] of Steps: 2 Entrance Stairs-Rails: None Home Layout: Two level;Able to live on main level with bedroom/bathroom     Bathroom Shower/Tub: Occupational psychologist: Standard     Home Equipment: Conservation officer, nature (2 wheels);Cane - single point          Prior Functioning/Environment Prior Level of Function : Independent/Modified Independent;Working/employed             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     Outcome Measure Help from another person eating meals?: None Help from another person taking care of personal grooming?: A Little Help from another person toileting, which includes using toliet, bedpan, or urinal?: A Little Help from another person bathing (including washing, rinsing, drying)?: A Little Help from  another person to put on and taking off regular upper body clothing?: None Help from another person to put on and taking off regular lower body clothing?: A Little 6 Click Score: 20   End of Session Equipment Utilized During Treatment: Gait belt;Rolling walker (2 wheels) Nurse Communication: Mobility status  Activity Tolerance: Patient tolerated treatment well Patient left: in bed;with call bell/phone within reach                   Time: 1914-7829 OT Time Calculation (min): 42 min Charges:  OT General Charges $OT Visit: 1 Visit OT Evaluation $OT Eval Moderate Complexity: 1 Mod OT Treatments $Self Care/Home Management : 23-37 mins  Shimeka Bacot,Rusty OTR/L 06/19/2022, 9:35 AM

## 2022-06-19 NOTE — Progress Notes (Signed)
Patient has been discharged. AVS given to patient.

## 2022-06-23 ENCOUNTER — Telehealth: Payer: Self-pay | Admitting: Orthopaedic Surgery

## 2022-06-23 NOTE — Telephone Encounter (Signed)
[employer] disability forms received. To [employer].

## 2022-07-02 ENCOUNTER — Encounter: Payer: Self-pay | Admitting: Orthopaedic Surgery

## 2022-07-02 ENCOUNTER — Ambulatory Visit (INDEPENDENT_AMBULATORY_CARE_PROVIDER_SITE_OTHER): Payer: 59 | Admitting: Physician Assistant

## 2022-07-02 DIAGNOSIS — Z96652 Presence of left artificial knee joint: Secondary | ICD-10-CM

## 2022-07-02 MED ORDER — OXYCODONE-ACETAMINOPHEN 5-325 MG PO TABS: 1.0000 | ORAL_TABLET | Freq: Three times a day (TID) | ORAL | 0 refills | Status: DC | PRN

## 2022-07-02 NOTE — Progress Notes (Signed)
Post-Op Visit Note   Patient: Sean Lee           Date of Birth: 1960-10-07           MRN: [id number] Visit Date: 07/02/2022 PCP: Abner Greenspan, MD   Assessment & Plan:  Chief Complaint:  Chief Complaint  Patient presents with   Left Knee - Follow-up    Left total knee arthroplasty 06/18/2022   Visit Diagnoses:  1. Status post total left knee replacement     Plan: Patient is a pleasant 61 year old gentleman who comes in today 2 weeks status post left total knee replacement 11-23.  He has been doing well.  He has been getting home health physical therapy and is ambulating with a cane.  He is taking oxycodone for pain which does seem to help.  He has been compliant taking a baby aspirin twice daily for DVT prophylaxis.  He has recently stopped wearing his compression socks.  Examination of the left knee reveals a well-healing surgical incision with nylon sutures in place.  No evidence of infection or cellulitis.  Calf is soft and nontender.  He does have moderate swelling to the left lower extremity.  Today, sutures were removed and Steri-Strips applied.  Have recommended that he continue to wear his compression socks but may remove these at night.  Continue with a baby aspirin twice daily for DVT prophylaxis.  Follow-up in 4 weeks for repeat evaluation and 2 view x-rays of the left knee.  Call with concerns or questions in the meantime.  Follow-Up Instructions: Return in about 4 weeks (around 07/30/2022).   Orders:  No orders of the defined types were placed in this encounter.  Meds ordered this encounter  Medications   oxyCODONE-acetaminophen (PERCOCET) 5-325 MG tablet    Sig: Take 1-2 tablets by mouth every 8 (eight) hours as needed.    Dispense:  40 tablet    Refill:  0    Imaging: No new imaging  PMFS History: Patient Active Problem List   Diagnosis Date Noted   Status post total left knee replacement 06/18/2022   Primary osteoarthritis of left knee 01/01/2022    Primary osteoarthritis of right knee 01/01/2022   Left knee pain 78/46/9629   Umbilical hernia 52/84/1324   ED (erectile dysfunction) 12/31/2020   Insomnia 04/02/2020   Right ankle swelling 07/25/2019   Gout 11/03/2018   Chronic pain of right ankle 08/31/2018   Routine general medical examination at a health care facility 11/01/2017   Prostate cancer screening 06/28/2017   Alopecia 11/04/2016   Back pain 05/06/2016   Sleep disorder 08/28/2015   Colon cancer screening 08/28/2015   Prediabetes 01/02/2013   Varicosities of leg 03/21/2012   Rectus diastasis of lower abdomen 03/21/2012   Obstructive sleep apnea 10/15/2009   Class 2 severe obesity due to excess calories with serious comorbidity and body mass index (BMI) of 38.0 to 38.9 in adult ([place]) 09/02/2009   Hyperlipidemia 02/02/2007   HEMATURIA, MICROSCOPIC 02/02/2007   Essential hypertension 02/01/2007   CEREBROVASCULAR ACCIDENT, HX OF 02/01/2007   Past Medical History:  Diagnosis Date   Contact dermatitis and other eczema due to plants (except food)    H/O: CVA (cardiovascular accident)    Carotid doppler neg (4/08)   Hematuria    Obesity, unspecified    Other and unspecified hyperlipidemia    Hx - no medication/diet controll and weight loss   Sacroiliitis, not elsewhere classified ([place])    Sleep apnea  uses CPAP but not every night   Unspecified essential hypertension    Varicose veins of left lower extremity     Family History  Problem Relation Age of Onset   Hypertension Mother    Diabetes Mother    Hyperlipidemia Unknown        in family    Cancer Neg Hx    Esophageal cancer Neg Hx    Rectal cancer Neg Hx    Stomach cancer Neg Hx    Colon cancer Neg Hx     Past Surgical History:  Procedure Laterality Date   Carotid dopplers  11/16/2006   Negative   HERNIA REPAIR     umbilical   TOTAL KNEE ARTHROPLASTY Left 06/18/2022   TOTAL KNEE ARTHROPLASTY Left 06/18/2022   Procedure: LEFT TOTAL KNEE ARTHROPLASTY;   Surgeon: Leandrew Koyanagi, MD;  Location: [facility];  Service: Orthopedics;  Laterality: Left;   Social History   Occupational History   Occupation: Truck Education administrator: [employer]  Tobacco Use   Smoking status: Never   Smokeless tobacco: Never  Vaping Use   Vaping Use: Never used  Substance and Sexual Activity   Alcohol use: Yes    Alcohol/week: 4.0 standard drinks of alcohol    Types: 4 Cans of beer per week    Comment: weekends   Drug use: No   Sexual activity: Not on file

## 2022-07-03 ENCOUNTER — Other Ambulatory Visit: Payer: Self-pay

## 2022-07-03 DIAGNOSIS — Z96652 Presence of left artificial knee joint: Secondary | ICD-10-CM

## 2022-07-06 ENCOUNTER — Other Ambulatory Visit: Payer: Self-pay | Admitting: Physician Assistant

## 2022-07-06 ENCOUNTER — Telehealth: Payer: Self-pay | Admitting: Physician Assistant

## 2022-07-06 NOTE — Telephone Encounter (Signed)
Pt called requesting to call pharmacy with pre auth for oxycodone. Please call pharmacy on file and call pt at [phone]. Please call pt when pre authsent

## 2022-07-07 ENCOUNTER — Ambulatory Visit (INDEPENDENT_AMBULATORY_CARE_PROVIDER_SITE_OTHER): Payer: 59 | Admitting: Physical Therapy

## 2022-07-07 ENCOUNTER — Other Ambulatory Visit: Payer: Self-pay

## 2022-07-07 ENCOUNTER — Encounter: Payer: Self-pay | Admitting: Physical Therapy

## 2022-07-07 DIAGNOSIS — M25562 Pain in left knee: Secondary | ICD-10-CM

## 2022-07-07 DIAGNOSIS — R600 Localized edema: Secondary | ICD-10-CM

## 2022-07-07 DIAGNOSIS — R2689 Other abnormalities of gait and mobility: Secondary | ICD-10-CM | POA: Diagnosis not present

## 2022-07-07 DIAGNOSIS — M6281 Muscle weakness (generalized): Secondary | ICD-10-CM

## 2022-07-07 DIAGNOSIS — M25662 Stiffness of left knee, not elsewhere classified: Secondary | ICD-10-CM

## 2022-07-07 DIAGNOSIS — R2681 Unsteadiness on feet: Secondary | ICD-10-CM

## 2022-07-07 NOTE — Telephone Encounter (Signed)
Called patient. Sent for preauth. It was approved. Patient did not answer, LMOM that it should be ready for pick up.

## 2022-07-07 NOTE — Therapy (Signed)
OUTPATIENT PHYSICAL THERAPY LOWER EXTREMITY EVALUATION   Patient Name: Sean Lee MRN: [id number] DOB:07-11-1961, 61 y.o., male Today's Date: 07/07/2022  END OF SESSION:  PT End of Session - 07/07/22 1640     Visit Number 1    Number of Visits 22    Date for PT Re-Evaluation 09/18/22    Authorization Type [employer]    Authorization Time Period 20% co-insurance    Authorization - Visit Number 1    Authorization - Number of Visits 50    Progress Note Due on Visit 10    PT Start Time 0845    PT Stop Time 0930    PT Time Calculation (min) 45 min    Activity Tolerance Patient tolerated treatment well;Patient limited by pain    Behavior During Therapy WFL for tasks assessed/performed             Past Medical History:  Diagnosis Date   Contact dermatitis and other eczema due to plants (except food)    H/O: CVA (cardiovascular accident)    Carotid doppler neg (4/08)   Hematuria    Obesity, unspecified    Other and unspecified hyperlipidemia    Hx - no medication/diet controll and weight loss   Sacroiliitis, not elsewhere classified ([place])    Sleep apnea    uses CPAP but not every night   Unspecified essential hypertension    Varicose veins of left lower extremity    Past Surgical History:  Procedure Laterality Date   Carotid dopplers  11/16/2006   Negative   HERNIA REPAIR     umbilical   TOTAL KNEE ARTHROPLASTY Left 06/18/2022   TOTAL KNEE ARTHROPLASTY Left 06/18/2022   Procedure: LEFT TOTAL KNEE ARTHROPLASTY;  Surgeon: Leandrew Koyanagi, MD;  Location: [place];  Service: Orthopedics;  Laterality: Left;   Patient Active Problem List   Diagnosis Date Noted   Status post total left knee replacement 06/18/2022   Primary osteoarthritis of left knee 01/01/2022   Primary osteoarthritis of right knee 01/01/2022   Left knee pain 17/40/8144   Umbilical hernia 81/85/6314   ED (erectile dysfunction) 12/31/2020   Insomnia 04/02/2020   Right ankle swelling 07/25/2019   Gout  11/03/2018   Chronic pain of right ankle 08/31/2018   Routine general medical examination at a health care facility 11/01/2017   Prostate cancer screening 06/28/2017   Alopecia 11/04/2016   Back pain 05/06/2016   Sleep disorder 08/28/2015   Colon cancer screening 08/28/2015   Prediabetes 01/02/2013   Varicosities of leg 03/21/2012   Rectus diastasis of lower abdomen 03/21/2012   Obstructive sleep apnea 10/15/2009   Class 2 severe obesity due to excess calories with serious comorbidity and body mass index (BMI) of 38.0 to 38.9 in adult [facility]) 09/02/2009   Hyperlipidemia 02/02/2007   HEMATURIA, MICROSCOPIC 02/02/2007   Essential hypertension 02/01/2007   CEREBROVASCULAR ACCIDENT, HX OF 02/01/2007    PCP: Abner Greenspan, MD  REFERRING PROVIDER: Leandrew Koyanagi, MD  REFERRING DIAG: [phone] (ICD-10-CM) - Status post total left knee replacement   THERAPY DIAG:  Stiffness of left knee, not elsewhere classified  Acute pain of left knee  Muscle weakness (generalized)  Other abnormalities of gait and mobility  Unsteadiness on feet  Localized edema  Rationale for Evaluation and Treatment: Rehabilitation  ONSET DATE: 06/18/2022 Left TKA  SUBJECTIVE:   SUBJECTIVE STATEMENT: On 06/18/2022 he underwent a left TKA due to OA. He had HHPT until 07/06/2022.  He has CPM which he is  using daily 2 hrs 2x/day.   PERTINENT HISTORY: OA, back pain, obesity, HTN, CVA, contact dermatitis  PAIN:  Are you having pain? Yes: NPRS scale: today 5/10 and over last week lowest 4/10 & highest 7/10 Pain location: left knee around knee, also calf, ant lower leg and thigh. Pain description: tenderness, sore, sharp with muscle spasm Aggravating factors: initially weight bearing Relieving factors: meds, ice, elevation  PRECAUTIONS: None  WEIGHT BEARING RESTRICTIONS: No  FALLS:  Has patient fallen in last 6 months? No  LIVING ENVIRONMENT: Lives with: lives with their spouse and adult niece Lives in:  House Stairs: Yes: Internal: 14 steps; on left going up and External: 4 steps; on right going up  bathroom & bedroom main level, his fish aquarium is upstairs. 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  PLOF: Independent, Independent with household mobility without device, and Independent with community mobility without device  PATIENT GOALS:  return to work, household activities.  NEXT MD VISIT: 07/31/2022  OBJECTIVE:   OBJECTIVE:   DIAGNOSTIC FINDINGS: 06/18/2022 - Postsurgical changes from right knee arthroplasty. Evidence of periprosthetic fracture. No loosening. Overlying external material limits the ability to assess for soft tissue abnormality. Within this limitation no focal abnormality is visualized.  PATIENT SURVEYS:  FOTO intake: 61%   predicted:  70%  COGNITION: Overall cognitive status: WFL   EDEMA:  RLE: above knee 47.5cm  around knee 45cm  below knee 42cm LLE: above knee 56.3cm  around knee 55cm below knee 44cm  POSTURE: rounded shoulders, forward head, flexed trunk , and weight shift right  PALPATION: Tenderness along joint line & scar. Steri-strips in place. Small scabs still present on incision.   LOWER EXTREMITY ROM:   ROM Right eval Left eval  Hip flexion    Hip extension    Hip abduction    Hip adduction    Hip internal rotation    Hip external rotation    Knee flexion  Supine  P: 94*  Knee extension  Seated: LAQ A: -18* Supine  P: -6* A: quad set -9*  Ankle dorsiflexion    Ankle plantarflexion    Ankle inversion    Ankle eversion     (Blank rows = not tested)  LOWER EXTREMITY MMT:  MMT Right eval Left eval  Hip flexion    Hip extension    Hip abduction    Hip adduction    Hip internal rotation    Hip external rotation    Knee flexion  3-/5  Knee extension  3-/5  Ankle  dorsiflexion    Ankle plantarflexion    Ankle inversion    Ankle eversion     (Blank rows = not tested)  FUNCTIONAL TESTS:  18 inch chair transfer: minA using BUEs on armrests to RW  GAIT: Distance walked: 80' Assistive device utilized: Environmental consultant - 2 wheeled Level of assistance: SBA Comments: excessive weight bearing BUEs and PWB on LLE   TODAY'S TREATMENT  DATE: 07/07/2022 Therex:    HEP instruction/performance c cues for techniques, handout provided. PT recommended elevation >/= 15 min >/= 2x/day and use of CPM 2x/day for 1-2 hours until unit is picked up. Trial set performed of each for comprehension and symptom assessment.  See below for exercise list Self-care: PT demo & verbal cues on positioning with pillows in bed for sleep. Pt & wife verbalized understanding.  PT demo & verbal cues on sit to/from stand technique. Pt return demo & verbalized understanding. Pt able to perform from 20" mat table to RW with UE assist.   PATIENT EDUCATION:  Education details: HEP, POC Person educated: Patient Education method: Explanation, Demonstration, Verbal cues, and Handouts Education comprehension: verbalized understanding, returned demonstration, and verbal cues required  HOME EXERCISE PROGRAM: Access Code: [email or web address] URL: [email or web address] Date: 07/07/2022 Prepared by: Jamey Reas  Exercises - Ankle Alphabet in Elevation  - 2-4 x daily - 7 x weekly - 1 sets - 1 reps - Quad Setting and Stretching  - 2-4 x daily - 7 x weekly - 5-10 sets - 10 reps - prop 5-10 minutes & quad set5 seconds hold - Supine Leg Press  - 2-4 x daily - 7 x weekly - 5-10 sets - 10 reps - 5 seconds hold - Supine Heel Slide with Strap  - 2-3 x daily - 7 x weekly - 2-3 sets - 10 reps - 5 seconds hold - Supine Straight Leg Raises  - 2-3 x daily - 7 x weekly - 2-3 sets - 10 reps - 5 seconds hold - Seated Knee Flexion Extension  AROM   - 2-4 x daily - 7 x weekly - 2-3 sets - 10 reps - 5 seconds hold - Seated straight leg lifts  - 2-3 x daily - 7 x weekly - 2-3 sets - 10 reps - 5 seconds hold - Seated Hamstring Stretch with Strap  - 2-4 x daily - 7 x weekly - 1 sets - 3 reps - 20-30 seconds hold  ASSESSMENT:  CLINICAL IMPRESSION: Patient is a 61 y.o. who comes to clinic with complaints of left knee pain with mobility, strength and movement coordination deficits that impair their ability to perform usual daily and recreational functional activities without increase difficulty/symptoms at this time.  Patient to benefit from skilled PT services to address impairments and limitations to improve to previous level of function without restriction secondary to condition.   OBJECTIVE IMPAIRMENTS: Abnormal gait, decreased activity tolerance, decreased balance, decreased endurance, decreased knowledge of condition, decreased knowledge of use of DME, decreased mobility, difficulty walking, decreased ROM, decreased strength, increased edema, increased muscle spasms, impaired flexibility, obesity, and pain.   ACTIVITY LIMITATIONS: carrying, lifting, bending, sitting, standing, squatting, sleeping, stairs, transfers, bed mobility, and locomotion level  PARTICIPATION LIMITATIONS: meal prep, cleaning, community activity, occupation, and yard work  PERSONAL FACTORS: 3+ comorbidities: see PMH  are also affecting patient's functional outcome.   REHAB POTENTIAL: Good  CLINICAL DECISION MAKING: Stable/uncomplicated  EVALUATION COMPLEXITY: Low   GOALS: Goals reviewed with patient? Yes  SHORT TERM GOALS: (target date 08/07/2022)   1.  Patient will demonstrate independent use of home exercise program to maintain progress from in clinic treatments. Goal status: New  2. Patient reports 25% improvement in left knee pain.  Goal Status: INITIAL  3. Left knee PROM -3* ext to 100* flex  LONG TERM GOALS: (target dates for all long term  goals are 10 weeks  09/18/2021 )   1. Patient will demonstrate/report pain  at worst less than or equal to 2/10 to facilitate minimal limitation in daily activity secondary to pain symptoms.  Goal status: New   2. Patient will demonstrate independent use of home exercise program to facilitate ability to maintain/progress functional gains from skilled physical therapy services.  Goal status: New   3. Patient will demonstrate FOTO outcome > or = 70 % to indicate reduced disability due to condition.  Goal status: New   4.  Patient will demonstrate left LE MMT 5/5 throughout to faciltiate usual transfers, stairs, squatting at [facility] for daily life.   Goal status: New   5.  Patient will demonstrate ability to perform work task of lifting & pushing weighted cart.  Goal status: New   6.  patient ambulates >500', negotiates ramps, curbs & stairs single rail independent without assistive device.  Goal status: New   7.  Left knee AROM 0* ext standing and 110* flexion seated. Goal Status: New   PLAN:  PT FREQUENCY: 2-3x/week  PT DURATION: 10 weeks  PLANNED INTERVENTIONS: Therapeutic exercises, Therapeutic activity, Neuro Muscular re-education, Balance training, Gait training, Patient/Family education, Joint mobilization, Stair training, DME instructions, Dry Needling, Electrical stimulation, Traction, Cryotherapy, vasopneumatic device, Moist heat, Taping, Ultrasound, Ionotophoresis '4mg'$ /ml Dexamethasone, and Manual therapy.  All included unless contraindicated  PLAN FOR NEXT SESSION: Review HEP knowledge/results. Manual therapy & exercise for range.    Jamey Reas, PT, DPT 07/07/2022, 4:57 PM

## 2022-07-08 ENCOUNTER — Ambulatory Visit (INDEPENDENT_AMBULATORY_CARE_PROVIDER_SITE_OTHER): Payer: 59 | Admitting: Physical Therapy

## 2022-07-08 ENCOUNTER — Encounter: Payer: Self-pay | Admitting: Physical Therapy

## 2022-07-08 DIAGNOSIS — R2689 Other abnormalities of gait and mobility: Secondary | ICD-10-CM | POA: Diagnosis not present

## 2022-07-08 DIAGNOSIS — M25662 Stiffness of left knee, not elsewhere classified: Secondary | ICD-10-CM

## 2022-07-08 DIAGNOSIS — M6281 Muscle weakness (generalized): Secondary | ICD-10-CM | POA: Diagnosis not present

## 2022-07-08 DIAGNOSIS — R2681 Unsteadiness on feet: Secondary | ICD-10-CM

## 2022-07-08 DIAGNOSIS — M25562 Pain in left knee: Secondary | ICD-10-CM | POA: Diagnosis not present

## 2022-07-08 DIAGNOSIS — R600 Localized edema: Secondary | ICD-10-CM

## 2022-07-08 NOTE — Therapy (Signed)
OUTPATIENT PHYSICAL THERAPY LOWER EXTREMITY TREATMENT   Patient Name: Sean Lee MRN: [id number] DOB:Sep 13, 1960, 61 y.o., male Today's Date: 07/08/2022  END OF SESSION:  PT End of Session - 07/08/22 0850     Visit Number 2    Number of Visits 22    Date for PT Re-Evaluation 09/18/22    Authorization Type [employer]    Authorization Time Period 20% co-insurance    Authorization - Visit Number 2    Authorization - Number of Visits 50    Progress Note Due on Visit 10    PT Start Time 0846    PT Stop Time 0927    PT Time Calculation (min) 41 min    Activity Tolerance Patient tolerated treatment well;Patient limited by pain    Behavior During Therapy [facility] for tasks assessed/performed              Past Medical History:  Diagnosis Date   Contact dermatitis and other eczema due to plants (except food)    H/O: CVA (cardiovascular accident)    Carotid doppler neg (4/08)   Hematuria    Obesity, unspecified    Other and unspecified hyperlipidemia    Hx - no medication/diet controll and weight loss   Sacroiliitis, not elsewhere classified ([place])    Sleep apnea    uses CPAP but not every night   Unspecified essential hypertension    Varicose veins of left lower extremity    Past Surgical History:  Procedure Laterality Date   Carotid dopplers  11/16/2006   Negative   HERNIA REPAIR     umbilical   TOTAL KNEE ARTHROPLASTY Left 06/18/2022   TOTAL KNEE ARTHROPLASTY Left 06/18/2022   Procedure: LEFT TOTAL KNEE ARTHROPLASTY;  Surgeon: Leandrew Koyanagi, MD;  Location: [place];  Service: Orthopedics;  Laterality: Left;   Patient Active Problem List   Diagnosis Date Noted   Status post total left knee replacement 06/18/2022   Primary osteoarthritis of left knee 01/01/2022   Primary osteoarthritis of right knee 01/01/2022   Left knee pain 38/18/2993   Umbilical hernia 71/69/6789   ED (erectile dysfunction) 12/31/2020   Insomnia 04/02/2020   Right ankle swelling 07/25/2019   Gout  11/03/2018   Chronic pain of right ankle 08/31/2018   Routine general medical examination at a health care facility 11/01/2017   Prostate cancer screening 06/28/2017   Alopecia 11/04/2016   Back pain 05/06/2016   Sleep disorder 08/28/2015   Colon cancer screening 08/28/2015   Prediabetes 01/02/2013   Varicosities of leg 03/21/2012   Rectus diastasis of lower abdomen 03/21/2012   Obstructive sleep apnea 10/15/2009   Class 2 severe obesity due to excess calories with serious comorbidity and body mass index (BMI) of 38.0 to 38.9 in adult [facility]) 09/02/2009   Hyperlipidemia 02/02/2007   HEMATURIA, MICROSCOPIC 02/02/2007   Essential hypertension 02/01/2007   CEREBROVASCULAR ACCIDENT, HX OF 02/01/2007    PCP: Abner Greenspan, MD  REFERRING PROVIDER: Leandrew Koyanagi, MD  REFERRING DIAG: [phone] (ICD-10-CM) - Status post total left knee replacement   THERAPY DIAG:  Stiffness of left knee, not elsewhere classified  Acute pain of left knee  Muscle weakness (generalized)  Other abnormalities of gait and mobility  Unsteadiness on feet  Localized edema  Rationale for Evaluation and Treatment: Rehabilitation  ONSET DATE: 06/18/2022 Left TKA  SUBJECTIVE:   SUBJECTIVE STATEMENT:  Still feeling stiff, I'm glad I got the surgery over with though. Feeling pretty good honestly, HEP is going well.  No falls, no close calls.   PERTINENT HISTORY: OA, back pain, obesity, HTN, CVA, contact dermatitis  PAIN:  Are you having pain? Yes: NPRS scale: 4/10 Pain location: L knee in general  Pain description: tight "like something is pulling on it", still numb too  Aggravating factors: bending, turning over in sleep Relieving factors: meds, ice, elevation, pillows between legs during sleep, CPM   PRECAUTIONS: None  WEIGHT BEARING RESTRICTIONS: No  FALLS:  Has patient fallen in last 6 months? No  LIVING ENVIRONMENT: Lives with: lives with their spouse and adult niece Lives in: House Stairs:  Yes: Internal: 14 steps; on left going up and External: 4 steps; on right going up  bathroom & bedroom main level, his fish aquarium is upstairs. 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  PLOF: Independent, Independent with household mobility without device, and Independent with community mobility without device  PATIENT GOALS:  return to work, household activities.  NEXT MD VISIT: 07/31/2022  OBJECTIVE:   OBJECTIVE:   DIAGNOSTIC FINDINGS: 06/18/2022 - Postsurgical changes from right knee arthroplasty. Evidence of periprosthetic fracture. No loosening. Overlying external material limits the ability to assess for soft tissue abnormality. Within this limitation no focal abnormality is visualized.  PATIENT SURVEYS:  FOTO intake: 61%   predicted:  70%  COGNITION: Overall cognitive status: WFL   EDEMA:  RLE: above knee 47.5cm  around knee 45cm  below knee 42cm LLE: above knee 56.3cm  around knee 55cm below knee 44cm  POSTURE: rounded shoulders, forward head, flexed trunk , and weight shift right  PALPATION: Tenderness along joint line & scar. Steri-strips in place. Small scabs still present on incision.   LOWER EXTREMITY ROM:   ROM Right eval Left eval  Hip flexion    Hip extension    Hip abduction    Hip adduction    Hip internal rotation    Hip external rotation    Knee flexion  Supine  P: 94*  Knee extension  Seated: LAQ A: -18* Supine  P: -6* A: quad set -9*  Ankle dorsiflexion    Ankle plantarflexion    Ankle inversion    Ankle eversion     (Blank rows = not tested)  LOWER EXTREMITY MMT:  MMT Right eval Left eval  Hip flexion    Hip extension    Hip abduction    Hip adduction    Hip internal rotation    Hip external rotation    Knee flexion  3-/5  Knee extension  3-/5  Ankle dorsiflexion    Ankle  plantarflexion    Ankle inversion    Ankle eversion     (Blank rows = not tested)  FUNCTIONAL TESTS:  18 inch chair transfer: minA using BUEs on armrests to RW  GAIT: Distance walked: 80' Assistive device utilized: Environmental consultant - 2 wheeled Level of assistance: SBA Comments: excessive weight bearing BUEs and PWB on LLE   TODAY'S TREATMENT  DATE:    07/08/22  TherEx  Precor bike seat 11 partial rotations to tolerance x6 minutes for ROM Knee extension with bolster under ankle and 2.5# on knee for passive pressure x3 minutes    Manual  Retrograde massage LE elevated  Tennis ball STM L quad LE elevated  Brief round of patella mobs all directions, actually OK/very mobile  Attempted overpressure for knee extension, very guarded and resistive Knee flexion overpressure seated at edge of mat table   07/07/2022 Therex:    HEP instruction/performance c cues for techniques, handout provided. PT recommended elevation >/= 15 min >/= 2x/day and use of CPM 2x/day for 1-2 hours until unit is picked up. Trial set performed of each for comprehension and symptom assessment.  See below for exercise list Self-care: PT demo & verbal cues on positioning with pillows in bed for sleep. Pt & wife verbalized understanding.  PT demo & verbal cues on sit to/from stand technique. Pt return demo & verbalized understanding. Pt able to perform from 20" mat table to RW with UE assist.   PATIENT EDUCATION:  Education details: interventions to reduce guarding, general progression in PT after surgery, where to get ortho bolster for use at home  Person educated: Patient Education method: Explanation, Demonstration, Verbal cues, and Handouts Education comprehension: verbalized understanding, returned demonstration, and verbal cues required  HOME EXERCISE PROGRAM: Access Code: [email or web address] URL: [email or web address] Date:  07/07/2022 Prepared by: Jamey Reas  Exercises - Ankle Alphabet in Elevation  - 2-4 x daily - 7 x weekly - 1 sets - 1 reps - Quad Setting and Stretching  - 2-4 x daily - 7 x weekly - 5-10 sets - 10 reps - prop 5-10 minutes & quad set5 seconds hold - Supine Leg Press  - 2-4 x daily - 7 x weekly - 5-10 sets - 10 reps - 5 seconds hold - Supine Heel Slide with Strap  - 2-3 x daily - 7 x weekly - 2-3 sets - 10 reps - 5 seconds hold - Supine Straight Leg Raises  - 2-3 x daily - 7 x weekly - 2-3 sets - 10 reps - 5 seconds hold - Seated Knee Flexion Extension AROM   - 2-4 x daily - 7 x weekly - 2-3 sets - 10 reps - 5 seconds hold - Seated straight leg lifts  - 2-3 x daily - 7 x weekly - 2-3 sets - 10 reps - 5 seconds hold - Seated Hamstring Stretch with Strap  - 2-4 x daily - 7 x weekly - 1 sets - 3 reps - 20-30 seconds hold  ASSESSMENT:  CLINICAL IMPRESSION:  Duane arrives today doing OK, we started on the bike after which he had some increased pain. Very guarded, tried some ROM work but he was very resistive- switched to manual techniques to reduce pain and guarding then continued ROM work.  Will continue to progress as able, he is very motivated to improve.   OBJECTIVE IMPAIRMENTS: Abnormal gait, decreased activity tolerance, decreased balance, decreased endurance, decreased knowledge of condition, decreased knowledge of use of DME, decreased mobility, difficulty walking, decreased ROM, decreased strength, increased edema, increased muscle spasms, impaired flexibility, obesity, and pain.   ACTIVITY LIMITATIONS: carrying, lifting, bending, sitting, standing, squatting, sleeping, stairs, transfers, bed mobility, and locomotion level  PARTICIPATION LIMITATIONS: meal prep, cleaning, community activity, occupation, and yard work  PERSONAL FACTORS: 3+ comorbidities: see PMH  are also affecting patient's functional outcome.   REHAB POTENTIAL: Good  CLINICAL DECISION MAKING:  Stable/uncomplicated  EVALUATION COMPLEXITY: Low   GOALS: Goals reviewed with patient? Yes  SHORT TERM GOALS: (target date 08/07/2022)   1.  Patient will demonstrate independent use of home exercise program to maintain progress from in clinic treatments. Goal status: New  2. Patient reports 25% improvement in left knee pain.  Goal Status: INITIAL  3. Left knee PROM -3* ext to 100* flex  LONG TERM GOALS: (target dates for all long term goals are 10 weeks  09/18/2021 )   1. Patient will demonstrate/report pain at worst less than or equal to 2/10 to facilitate minimal limitation in daily activity secondary to pain symptoms.  Goal status: New   2. Patient will demonstrate independent use of home exercise program to facilitate ability to maintain/progress functional gains from skilled physical therapy services.  Goal status: New   3. Patient will demonstrate FOTO outcome > or = 70 % to indicate reduced disability due to condition.  Goal status: New   4.  Patient will demonstrate left LE MMT 5/5 throughout to faciltiate usual transfers, stairs, squatting at [facility] for daily life.   Goal status: New   5.  Patient will demonstrate ability to perform work task of lifting & pushing weighted cart.  Goal status: New   6.  patient ambulates >500', negotiates ramps, curbs & stairs single rail independent without assistive device.  Goal status: New   7.  Left knee AROM 0* ext standing and 110* flexion seated. Goal Status: New   PLAN:  PT FREQUENCY: 2-3x/week  PT DURATION: 10 weeks  PLANNED INTERVENTIONS: Therapeutic exercises, Therapeutic activity, Neuro Muscular re-education, Balance training, Gait training, Patient/Family education, Joint mobilization, Stair training, DME instructions, Dry Needling, Electrical stimulation, Traction, Cryotherapy, vasopneumatic device, Moist heat, Taping, Ultrasound, Ionotophoresis '4mg'$ /ml Dexamethasone, and Manual therapy.  All included unless  contraindicated  PLAN FOR NEXT SESSION: Review HEP knowledge/results. Manual therapy & exercise for range.    Ann Lions PT DPT PN2  07/08/2022, 9:38 AM

## 2022-07-13 ENCOUNTER — Encounter: Payer: Self-pay | Admitting: Physical Therapy

## 2022-07-13 ENCOUNTER — Ambulatory Visit (INDEPENDENT_AMBULATORY_CARE_PROVIDER_SITE_OTHER): Payer: 59 | Admitting: Physical Therapy

## 2022-07-13 DIAGNOSIS — M25562 Pain in left knee: Secondary | ICD-10-CM | POA: Diagnosis not present

## 2022-07-13 DIAGNOSIS — M6281 Muscle weakness (generalized): Secondary | ICD-10-CM

## 2022-07-13 DIAGNOSIS — R2689 Other abnormalities of gait and mobility: Secondary | ICD-10-CM | POA: Diagnosis not present

## 2022-07-13 DIAGNOSIS — M25662 Stiffness of left knee, not elsewhere classified: Secondary | ICD-10-CM | POA: Diagnosis not present

## 2022-07-13 DIAGNOSIS — R2681 Unsteadiness on feet: Secondary | ICD-10-CM

## 2022-07-13 DIAGNOSIS — R600 Localized edema: Secondary | ICD-10-CM

## 2022-07-13 NOTE — Therapy (Signed)
OUTPATIENT PHYSICAL THERAPY LOWER EXTREMITY TREATMENT   Patient Name: Sean Lee MRN: [id number] DOB:06-25-1961, 61 y.o., male Today's Date: 07/13/2022  END OF SESSION:  PT End of Session - 07/13/22 1151     Visit Number 3    Number of Visits 22    Date for PT Re-Evaluation 09/18/22    Authorization Type [employer]    Authorization Time Period 20% co-insurance    Authorization - Visit Number 3    Authorization - Number of Visits 50    Progress Note Due on Visit 10    PT Start Time 1147    PT Stop Time 1246    PT Time Calculation (min) 59 min    Activity Tolerance Patient tolerated treatment well;Patient limited by pain    Behavior During Therapy WFL for tasks assessed/performed               Past Medical History:  Diagnosis Date   Contact dermatitis and other eczema due to plants (except food)    H/O: CVA (cardiovascular accident)    Carotid doppler neg (4/08)   Hematuria    Obesity, unspecified    Other and unspecified hyperlipidemia    Hx - no medication/diet controll and weight loss   Sacroiliitis, not elsewhere classified ([place])    Sleep apnea    uses CPAP but not every night   Unspecified essential hypertension    Varicose veins of left lower extremity    Past Surgical History:  Procedure Laterality Date   Carotid dopplers  11/16/2006   Negative   HERNIA REPAIR     umbilical   TOTAL KNEE ARTHROPLASTY Left 06/18/2022   TOTAL KNEE ARTHROPLASTY Left 06/18/2022   Procedure: LEFT TOTAL KNEE ARTHROPLASTY;  Surgeon: Leandrew Koyanagi, MD;  Location: [facility];  Service: Orthopedics;  Laterality: Left;   Patient Active Problem List   Diagnosis Date Noted   Status post total left knee replacement 06/18/2022   Primary osteoarthritis of left knee 01/01/2022   Primary osteoarthritis of right knee 01/01/2022   Left knee pain 08/25/3233   Umbilical hernia 57/32/2025   ED (erectile dysfunction) 12/31/2020   Insomnia 04/02/2020   Right ankle swelling 07/25/2019   Gout  11/03/2018   Chronic pain of right ankle 08/31/2018   Routine general medical examination at a health care facility 11/01/2017   Prostate cancer screening 06/28/2017   Alopecia 11/04/2016   Back pain 05/06/2016   Sleep disorder 08/28/2015   Colon cancer screening 08/28/2015   Prediabetes 01/02/2013   Varicosities of leg 03/21/2012   Rectus diastasis of lower abdomen 03/21/2012   Obstructive sleep apnea 10/15/2009   Class 2 severe obesity due to excess calories with serious comorbidity and body mass index (BMI) of 38.0 to 38.9 in adult [place]) 09/02/2009   Hyperlipidemia 02/02/2007   HEMATURIA, MICROSCOPIC 02/02/2007   Essential hypertension 02/01/2007   CEREBROVASCULAR ACCIDENT, HX OF 02/01/2007    PCP: Abner Greenspan, MD  REFERRING PROVIDER: Leandrew Koyanagi, MD  REFERRING DIAG: [phone] (ICD-10-CM) - Status post total left knee replacement   THERAPY DIAG:  Stiffness of left knee, not elsewhere classified  Acute pain of left knee  Muscle weakness (generalized)  Other abnormalities of gait and mobility  Unsteadiness on feet  Localized edema  Rationale for Evaluation and Treatment: Rehabilitation  ONSET DATE: 06/18/2022 Left TKA  SUBJECTIVE:   SUBJECTIVE STATEMENT: His knee has been aching still.  He tried pillows at night as PT recommended which helped some. He has  been doing some walking in house without cane.  He had an issue with some light headedness yesterday.   PERTINENT HISTORY: OA, back pain, obesity, HTN, CVA, contact dermatitis  PAIN:  Are you having pain? Yes: NPRS scale: 3/10 today, and since last PT lowest 3/10 & highest 5/10 Pain location: L knee in general  Pain description: tight "like something is pulling on it", still numb too  Aggravating factors: bending, turning over in sleep Relieving factors: meds, ice, elevation, pillows between legs during sleep, CPM   PRECAUTIONS: None  WEIGHT BEARING RESTRICTIONS: No  FALLS:  Has patient fallen in last 6  months? No  LIVING ENVIRONMENT: Lives with: lives with their spouse and adult niece Lives in: House Stairs: Yes: Internal: 14 steps; on left going up and External: 4 steps; on right going up  bathroom & bedroom main level, his fish aquarium is upstairs. 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  PLOF: Independent, Independent with household mobility without device, and Independent with community mobility without device  PATIENT GOALS:  return to work, household activities.  NEXT MD VISIT: 07/31/2022  OBJECTIVE:   OBJECTIVE:   DIAGNOSTIC FINDINGS: 06/18/2022 - Postsurgical changes from right knee arthroplasty. Evidence of periprosthetic fracture. No loosening. Overlying external material limits the ability to assess for soft tissue abnormality. Within this limitation no focal abnormality is visualized.  PATIENT SURVEYS:  FOTO intake: 61%   predicted:  70%  COGNITION: Overall cognitive status: WFL   EDEMA:  RLE: above knee 47.5cm  around knee 45cm  below knee 42cm LLE: above knee 56.3cm  around knee 55cm below knee 44cm  POSTURE: rounded shoulders, forward head, flexed trunk , and weight shift right  PALPATION: Tenderness along joint line & scar. Steri-strips in place. Small scabs still present on incision.   LOWER EXTREMITY ROM:   ROM Right eval Left eval  Hip flexion    Hip extension    Hip abduction    Hip adduction    Hip internal rotation    Hip external rotation    Knee flexion  Supine  P: 94*  Knee extension  Seated: LAQ A: -18* Supine  P: -6* A: quad set -9*  Ankle dorsiflexion    Ankle plantarflexion    Ankle inversion    Ankle eversion     (Blank rows = not tested)  LOWER EXTREMITY MMT:  MMT Right eval Left eval  Hip flexion    Hip extension    Hip abduction    Hip adduction    Hip internal  rotation    Hip external rotation    Knee flexion  3-/5  Knee extension  3-/5  Ankle dorsiflexion    Ankle plantarflexion    Ankle inversion    Ankle eversion     (Blank rows = not tested)  FUNCTIONAL TESTS:  18 inch chair transfer: minA using BUEs on armrests to RW  GAIT: Distance walked: 80' Assistive device utilized: Environmental consultant - 2 wheeled Level of assistance: SBA Comments: excessive weight bearing BUEs and PWB on LLE   TODAY'S TREATMENT  DATE:  07/13/2022 Therapeutic Exercise: SciFit Bike seat 13 level 1 with full revolutions with BLEs & BUEs 6 min & BLEs only 2 min Gastroc stretch on step heel depression 30 sec hold 2 reps Heel raises with LUE support reaching RUE overhead 10 reps Hamstring stretch seated LLE long sit strap DF 30 sec hold 2 reps Quad stretch hooklying supine LLE over edge strap knee flexion 30 sec 2 reps  Gait Training: Pre-gait stepping over 4" block X 12" long working on terminal stance & swing flexion and stance extension weighting LLE. Carryover of above for gait with cane.   Manual Therapy: Flexion PROM with overpressure manual traction & tibial internal rotation.   Self-care: PT demo & verbal cues on scar mobs including rationale. Pt verbalized understanding. Light headed -Pt standing so PT had pt lay supine - BP 115/68 HR 123 no chest pain. & light-headedness subsided. Supine >10 min BP 107/65 HR 108 no symptoms Standing BP 69/57 HR 143 light headedness.  PT educated pt on using urine to monitor for dehydration. Needs to drink plenty of water. Pt verbalized understanding.  Pt educated on supine to sit, sitting until dizziness clears. Then standing until clears. Keep eyes open focusing on non-moving object.   Vasopneumatic: Left knee medium compression 34* 10 min with BLEs elevated.  07/08/22  TherEx  Precor bike seat 11 partial rotations to tolerance x6 minutes for  ROM Knee extension with bolster under ankle and 2.5# on knee for passive pressure x3 minutes    Manual  Retrograde massage LE elevated  Tennis ball STM L quad LE elevated  Brief round of patella mobs all directions, actually OK/very mobile  Attempted overpressure for knee extension, very guarded and resistive Knee flexion overpressure seated at edge of mat table   07/07/2022 Therex:    HEP instruction/performance c cues for techniques, handout provided. PT recommended elevation >/= 15 min >/= 2x/day and use of CPM 2x/day for 1-2 hours until unit is picked up. Trial set performed of each for comprehension and symptom assessment.  See below for exercise list Self-care: PT demo & verbal cues on positioning with pillows in bed for sleep. Pt & wife verbalized understanding.  PT demo & verbal cues on sit to/from stand technique. Pt return demo & verbalized understanding. Pt able to perform from 20" mat table to RW with UE assist.   PATIENT EDUCATION:  Education details: interventions to reduce guarding, general progression in PT after surgery, where to get ortho bolster for use at home  Person educated: Patient Education method: Explanation, Demonstration, Verbal cues, and Handouts Education comprehension: verbalized understanding, returned demonstration, and verbal cues required  HOME EXERCISE PROGRAM: Access Code: [email or web address] URL: [email or web address] Date: 07/07/2022 Prepared by: Jamey Reas  Exercises - Ankle Alphabet in Elevation  - 2-4 x daily - 7 x weekly - 1 sets - 1 reps - Quad Setting and Stretching  - 2-4 x daily - 7 x weekly - 5-10 sets - 10 reps - prop 5-10 minutes & quad set5 seconds hold - Supine Leg Press  - 2-4 x daily - 7 x weekly - 5-10 sets - 10 reps - 5 seconds hold - Supine Heel Slide with Strap  - 2-3 x daily - 7 x weekly - 2-3 sets - 10 reps - 5 seconds hold - Supine Straight Leg Raises  - 2-3 x daily - 7 x weekly - 2-3 sets - 10 reps - 5 seconds  hold - Seated Knee Flexion Extension AROM   -  2-4 x daily - 7 x weekly - 2-3 sets - 10 reps - 5 seconds hold - Seated straight leg lifts  - 2-3 x daily - 7 x weekly - 2-3 sets - 10 reps - 5 seconds hold - Seated Hamstring Stretch with Strap  - 2-4 x daily - 7 x weekly - 1 sets - 3 reps - 20-30 seconds hold  ASSESSMENT:  CLINICAL IMPRESSION: Patient having issues for light headedness and was positive for orthostatic hypotension. He appears to understand PT recommendations including RW until dizziness subsides.  Patient improved functional knee motion for exercises today.    OBJECTIVE IMPAIRMENTS: Abnormal gait, decreased activity tolerance, decreased balance, decreased endurance, decreased knowledge of condition, decreased knowledge of use of DME, decreased mobility, difficulty walking, decreased ROM, decreased strength, increased edema, increased muscle spasms, impaired flexibility, obesity, and pain.   ACTIVITY LIMITATIONS: carrying, lifting, bending, sitting, standing, squatting, sleeping, stairs, transfers, bed mobility, and locomotion level  PARTICIPATION LIMITATIONS: meal prep, cleaning, community activity, occupation, and yard work  PERSONAL FACTORS: 3+ comorbidities: see PMH  are also affecting patient's functional outcome.   REHAB POTENTIAL: Good  CLINICAL DECISION MAKING: Stable/uncomplicated  EVALUATION COMPLEXITY: Low   GOALS: Goals reviewed with patient? Yes  SHORT TERM GOALS: (target date 08/07/2022)   1.  Patient will demonstrate independent use of home exercise program to maintain progress from in clinic treatments. Goal status: New  2. Patient reports 25% improvement in left knee pain.  Goal Status: INITIAL  3. Left knee PROM -3* ext to 100* flex  LONG TERM GOALS: (target dates for all long term goals are 10 weeks  09/18/2021 )   1. Patient will demonstrate/report pain at worst less than or equal to 2/10 to facilitate minimal limitation in daily activity  secondary to pain symptoms.  Goal status: New   2. Patient will demonstrate independent use of home exercise program to facilitate ability to maintain/progress functional gains from skilled physical therapy services.  Goal status: New   3. Patient will demonstrate FOTO outcome > or = 70 % to indicate reduced disability due to condition.  Goal status: New   4.  Patient will demonstrate left LE MMT 5/5 throughout to faciltiate usual transfers, stairs, squatting at [facility] for daily life.   Goal status: New   5.  Patient will demonstrate ability to perform work task of lifting & pushing weighted cart.  Goal status: New   6.  patient ambulates >500', negotiates ramps, curbs & stairs single rail independent without assistive device.  Goal status: New   7.  Left knee AROM 0* ext standing and 110* flexion seated. Goal Status: New   PLAN:  PT FREQUENCY: 2-3x/week  PT DURATION: 10 weeks  PLANNED INTERVENTIONS: Therapeutic exercises, Therapeutic activity, Neuro Muscular re-education, Balance training, Gait training, Patient/Family education, Joint mobilization, Stair training, DME instructions, Dry Needling, Electrical stimulation, Traction, Cryotherapy, vasopneumatic device, Moist heat, Taping, Ultrasound, Ionotophoresis '4mg'$ /ml Dexamethasone, and Manual therapy.  All included unless contraindicated  PLAN FOR NEXT SESSION: check on dizziness. Check ROM. Updated HEP to include muscle stretches.  Manual therapy & exercise for range.    Jamey Reas, PT, DPT 07/13/2022, 12:56 PM

## 2022-07-14 ENCOUNTER — Encounter: Payer: 59 | Admitting: Physical Therapy

## 2022-07-15 ENCOUNTER — Ambulatory Visit (INDEPENDENT_AMBULATORY_CARE_PROVIDER_SITE_OTHER): Payer: 59 | Admitting: Physical Therapy

## 2022-07-15 ENCOUNTER — Encounter: Payer: Self-pay | Admitting: Physical Therapy

## 2022-07-15 DIAGNOSIS — R2689 Other abnormalities of gait and mobility: Secondary | ICD-10-CM

## 2022-07-15 DIAGNOSIS — M6281 Muscle weakness (generalized): Secondary | ICD-10-CM | POA: Diagnosis not present

## 2022-07-15 DIAGNOSIS — M25562 Pain in left knee: Secondary | ICD-10-CM | POA: Diagnosis not present

## 2022-07-15 DIAGNOSIS — M25662 Stiffness of left knee, not elsewhere classified: Secondary | ICD-10-CM

## 2022-07-15 DIAGNOSIS — R2681 Unsteadiness on feet: Secondary | ICD-10-CM

## 2022-07-15 DIAGNOSIS — R600 Localized edema: Secondary | ICD-10-CM

## 2022-07-15 NOTE — Therapy (Signed)
OUTPATIENT PHYSICAL THERAPY LOWER EXTREMITY TREATMENT   Patient Name: Sean Lee MRN: [id number] DOB:02-23-1961, 61 y.o., male Today's Date: 07/15/2022  END OF SESSION:  PT End of Session - 07/15/22 0813     Visit Number 4    Number of Visits 22    Date for PT Re-Evaluation 09/18/22    Authorization Type [employer]    Authorization Time Period 20% co-insurance    Authorization - Visit Number 4    Authorization - Number of Visits 50    Progress Note Due on Visit 10    PT Start Time 0809    PT Stop Time 0855    PT Time Calculation (min) 46 min    Activity Tolerance Patient tolerated treatment well    Behavior During Therapy WFL for tasks assessed/performed               Past Medical History:  Diagnosis Date   Contact dermatitis and other eczema due to plants (except food)    H/O: CVA (cardiovascular accident)    Carotid doppler neg (4/08)   Hematuria    Obesity, unspecified    Other and unspecified hyperlipidemia    Hx - no medication/diet controll and weight loss   Sacroiliitis, not elsewhere classified ([place])    Sleep apnea    uses CPAP but not every night   Unspecified essential hypertension    Varicose veins of left lower extremity    Past Surgical History:  Procedure Laterality Date   Carotid dopplers  11/16/2006   Negative   HERNIA REPAIR     umbilical   TOTAL KNEE ARTHROPLASTY Left 06/18/2022   TOTAL KNEE ARTHROPLASTY Left 06/18/2022   Procedure: LEFT TOTAL KNEE ARTHROPLASTY;  Surgeon: Leandrew Koyanagi, MD;  Location: [facility];  Service: Orthopedics;  Laterality: Left;   Patient Active Problem List   Diagnosis Date Noted   Status post total left knee replacement 06/18/2022   Primary osteoarthritis of left knee 01/01/2022   Primary osteoarthritis of right knee 01/01/2022   Left knee pain 62/26/3335   Umbilical hernia 45/62/5638   ED (erectile dysfunction) 12/31/2020   Insomnia 04/02/2020   Right ankle swelling 07/25/2019   Gout 11/03/2018   Chronic pain  of right ankle 08/31/2018   Routine general medical examination at a health care facility 11/01/2017   Prostate cancer screening 06/28/2017   Alopecia 11/04/2016   Back pain 05/06/2016   Sleep disorder 08/28/2015   Colon cancer screening 08/28/2015   Prediabetes 01/02/2013   Varicosities of leg 03/21/2012   Rectus diastasis of lower abdomen 03/21/2012   Obstructive sleep apnea 10/15/2009   Class 2 severe obesity due to excess calories with serious comorbidity and body mass index (BMI) of 38.0 to 38.9 in adult [facility]) 09/02/2009   Hyperlipidemia 02/02/2007   HEMATURIA, MICROSCOPIC 02/02/2007   Essential hypertension 02/01/2007   CEREBROVASCULAR ACCIDENT, HX OF 02/01/2007    PCP: Abner Greenspan, MD  REFERRING PROVIDER: Leandrew Koyanagi, MD  REFERRING DIAG: [phone] (ICD-10-CM) - Status post total left knee replacement   THERAPY DIAG:  Stiffness of left knee, not elsewhere classified  Acute pain of left knee  Muscle weakness (generalized)  Other abnormalities of gait and mobility  Unsteadiness on feet  Localized edema  Rationale for Evaluation and Treatment: Rehabilitation  ONSET DATE: 06/18/2022 Left TKA  SUBJECTIVE:   SUBJECTIVE STATEMENT: He has not taken his BP meds as Dr. Sharol Given recommended and no more light-headedness noted.     PERTINENT HISTORY: OA,  back pain, obesity, HTN, CVA, contact dermatitis  PAIN:  Are you having pain? Yes: NPRS scale: 4/10 today, and since last PT lowest 3/10 & highest 6/10 Pain location: L knee in general  Pain description: tight "like something is pulling on it", still numb too  Aggravating factors: bending, turning over in sleep Relieving factors: meds, ice, elevation, pillows between legs during sleep, CPM   PRECAUTIONS: None  WEIGHT BEARING RESTRICTIONS: No  FALLS:  Has patient fallen in last 6 months? No  LIVING ENVIRONMENT: Lives with: lives with their spouse and adult niece Lives in: House Stairs: Yes: Internal: 14 steps; on  left going up and External: 4 steps; on right going up  bathroom & bedroom main level, his fish aquarium is upstairs. 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  PLOF: Independent, Independent with household mobility without device, and Independent with community mobility without device  PATIENT GOALS:  return to work, household activities.  NEXT MD VISIT: 07/31/2022  OBJECTIVE:   OBJECTIVE:   DIAGNOSTIC FINDINGS: 06/18/2022 - Postsurgical changes from right knee arthroplasty. Evidence of periprosthetic fracture. No loosening. Overlying external material limits the ability to assess for soft tissue abnormality. Within this limitation no focal abnormality is visualized.  PATIENT SURVEYS:  FOTO intake: 61%   predicted:  70%  COGNITION: Overall cognitive status: WFL   EDEMA:  RLE: above knee 47.5cm  around knee 45cm  below knee 42cm LLE: above knee 56.3cm  around knee 55cm below knee 44cm  POSTURE: rounded shoulders, forward head, flexed trunk , and weight shift right  PALPATION: Tenderness along joint line & scar. Steri-strips in place. Small scabs still present on incision.   LOWER EXTREMITY ROM:   ROM Left eval Left  07/15/22  Hip flexion    Hip extension    Hip abduction    Hip adduction    Hip internal rotation    Hip external rotation    Knee flexion Supine  P: 94* Seated P:111* A: 102*  Knee extension Seated: LAQ A: -18* Supine  P: -6* A: quad set -9* Seated: LAQ A: -9* Supine  P: -3* A: quad set -4*  Ankle dorsiflexion    Ankle plantarflexion    Ankle inversion    Ankle eversion     (Blank rows = not tested)  LOWER EXTREMITY MMT:  MMT Right eval Left eval  Hip flexion    Hip extension    Hip abduction    Hip adduction    Hip internal rotation    Hip external rotation    Knee flexion  3-/5   Knee extension  3-/5  Ankle dorsiflexion    Ankle plantarflexion    Ankle inversion    Ankle eversion     (Blank rows = not tested)  FUNCTIONAL TESTS:  18 inch chair transfer: minA using BUEs on armrests to RW  GAIT: Distance walked: 80' Assistive device utilized: Environmental consultant - 2 wheeled Level of assistance: SBA Comments: excessive weight bearing BUEs and PWB on LLE   TODAY'S TREATMENT  DATE:  07/14/2022 Seated BP 147/87 HR 107  Therapeutic Exercise: Leg Press BLEs 100# 10 reps, 2 sets  Hamstring stretch on leg press machine strap SLR with PT manual assist for knee ext 30 sec hold 2 reps Gastroc stretch on incline board 30 sec hold 2 reps Heel raises on incline board with LBE support reaching RUE overhead 10 reps Seated LAQ & active knee flexion with opposing motion contralateral 5 sec hold 15 reps Supine quad set 5 sec hold 15 reps  Neuromuscular Re-education Tandem stance on foam 30 sec ea LLE in front & in back  Gait Training: Pt amb 50' without AD with supervision. No LOB or knee instability noted.   Manual Therapy: Seated Flexion PROM with overpressure manual traction & tibial internal rotation.  Supine ext grade IV mob.  Vasopneumatic: Left knee medium compression 34* 10 min with BLEs elevated.  07/13/2022 Therapeutic Exercise: SciFit Bike seat 13 level 1 with full revolutions with BLEs & BUEs 6 min & BLEs only 2 min Gastroc stretch on step heel depression 30 sec hold 2 reps Heel raises with LUE support reaching RUE overhead 10 reps Hamstring stretch seated LLE long sit strap DF 30 sec hold 2 reps Quad stretch hooklying supine LLE over edge strap knee flexion 30 sec 2 reps  Gait Training: Pre-gait stepping over 4" block X 12" long working on terminal stance & swing flexion and stance extension weighting LLE. Carryover of above for gait with cane.   Manual Therapy: Flexion PROM with  overpressure manual traction & tibial internal rotation.   Self-care: PT demo & verbal cues on scar mobs including rationale. Pt verbalized understanding. Light headed -Pt standing so PT had pt lay supine - BP 115/68 HR 123 no chest pain. & light-headedness subsided. Supine >10 min BP 107/65 HR 108 no symptoms Standing BP 69/57 HR 143 light headedness.  PT educated pt on using urine to monitor for dehydration. Needs to drink plenty of water. Pt verbalized understanding.  Pt educated on supine to sit, sitting until dizziness clears. Then standing until clears. Keep eyes open focusing on non-moving object.   Vasopneumatic: Left knee medium compression 34* 10 min with BLEs elevated.  07/08/22  TherEx  Precor bike seat 11 partial rotations to tolerance x6 minutes for ROM Knee extension with bolster under ankle and 2.5# on knee for passive pressure x3 minutes    Manual  Retrograde massage LE elevated  Tennis ball STM L quad LE elevated  Brief round of patella mobs all directions, actually OK/very mobile  Attempted overpressure for knee extension, very guarded and resistive Knee flexion overpressure seated at edge of mat table    PATIENT EDUCATION:  Education details: interventions to reduce guarding, general progression in PT after surgery, where to get ortho bolster for use at home  Person educated: Patient Education method: Explanation, Demonstration, Verbal cues, and Handouts Education comprehension: verbalized understanding, returned demonstration, and verbal cues required  HOME EXERCISE PROGRAM: Access Code: [email or web address] URL: [email or web address] Date: 07/07/2022 Prepared by: Jamey Reas  Exercises - Ankle Alphabet in Elevation  - 2-4 x daily - 7 x weekly - 1 sets - 1 reps - Quad Setting and Stretching  - 2-4 x daily - 7 x weekly - 5-10 sets - 10 reps - prop 5-10 minutes & quad set5 seconds hold - Supine Leg Press  - 2-4 x daily - 7 x weekly - 5-10 sets - 10  reps - 5 seconds hold - Supine Heel Slide with Strap  -  2-3 x daily - 7 x weekly - 2-3 sets - 10 reps - 5 seconds hold - Supine Straight Leg Raises  - 2-3 x daily - 7 x weekly - 2-3 sets - 10 reps - 5 seconds hold - Seated Knee Flexion Extension AROM   - 2-4 x daily - 7 x weekly - 2-3 sets - 10 reps - 5 seconds hold - Seated straight leg lifts  - 2-3 x daily - 7 x weekly - 2-3 sets - 10 reps - 5 seconds hold - Seated Hamstring Stretch with Strap  - 2-4 x daily - 7 x weekly - 1 sets - 3 reps - 20-30 seconds hold  ASSESSMENT: CLINICAL IMPRESSION: Patient's range improved. He is responding well to manual therapy & exercises.  Patient improved functional knee motion for exercises today.    OBJECTIVE IMPAIRMENTS: Abnormal gait, decreased activity tolerance, decreased balance, decreased endurance, decreased knowledge of condition, decreased knowledge of use of DME, decreased mobility, difficulty walking, decreased ROM, decreased strength, increased edema, increased muscle spasms, impaired flexibility, obesity, and pain.   ACTIVITY LIMITATIONS: carrying, lifting, bending, sitting, standing, squatting, sleeping, stairs, transfers, bed mobility, and locomotion level  PARTICIPATION LIMITATIONS: meal prep, cleaning, community activity, occupation, and yard work  PERSONAL FACTORS: 3+ comorbidities: see PMH  are also affecting patient's functional outcome.   REHAB POTENTIAL: Good  CLINICAL DECISION MAKING: Stable/uncomplicated  EVALUATION COMPLEXITY: Low   GOALS: Goals reviewed with patient? Yes  SHORT TERM GOALS: (target date 08/07/2022)   1.  Patient will demonstrate independent use of home exercise program to maintain progress from in clinic treatments. Goal status: New  2. Patient reports 25% improvement in left knee pain.  Goal Status: INITIAL  3. Left knee PROM -3* ext to 100* flex  LONG TERM GOALS: (target dates for all long term goals are 10 weeks  09/18/2021 )   1. Patient will  demonstrate/report pain at worst less than or equal to 2/10 to facilitate minimal limitation in daily activity secondary to pain symptoms.  Goal status: New   2. Patient will demonstrate independent use of home exercise program to facilitate ability to maintain/progress functional gains from skilled physical therapy services.  Goal status: New   3. Patient will demonstrate FOTO outcome > or = 70 % to indicate reduced disability due to condition.  Goal status: New   4.  Patient will demonstrate left LE MMT 5/5 throughout to faciltiate usual transfers, stairs, squatting at [facility] for daily life.   Goal status: New   5.  Patient will demonstrate ability to perform work task of lifting & pushing weighted cart.  Goal status: New   6.  patient ambulates >500', negotiates ramps, curbs & stairs single rail independent without assistive device.  Goal status: New   7.  Left knee AROM 0* ext standing and 110* flexion seated. Goal Status: New   PLAN:  PT FREQUENCY: 2-3x/week  PT DURATION: 10 weeks  PLANNED INTERVENTIONS: Therapeutic exercises, Therapeutic activity, Neuro Muscular re-education, Balance training, Gait training, Patient/Family education, Joint mobilization, Stair training, DME instructions, Dry Needling, Electrical stimulation, Traction, Cryotherapy, vasopneumatic device, Moist heat, Taping, Ultrasound, Ionotophoresis '4mg'$ /ml Dexamethasone, and Manual therapy.  All included unless contraindicated  PLAN FOR NEXT SESSION: Update HEP to include muscle stretches.  Manual therapy & exercise for range. Progress to standing functional exercises.    Jamey Reas, PT, DPT 07/15/2022, 1:35 PM

## 2022-07-16 NOTE — Therapy (Signed)
OUTPATIENT PHYSICAL THERAPY LOWER EXTREMITY TREATMENT   Patient Name: Sean Lee MRN: [id number] DOB:Mar 15, 1961, 61 y.o., male Today's Date: 07/17/2022  END OF SESSION:  PT End of Session - 07/17/22 0842     Visit Number 5    Number of Visits 22    Date for PT Re-Evaluation 09/18/22    Authorization Type [employer]    Authorization Time Period 20% co-insurance    Authorization - Visit Number 5    Authorization - Number of Visits 50    Progress Note Due on Visit 10    PT Start Time 0802    PT Stop Time 4098    PT Time Calculation (min) 45 min    Activity Tolerance Patient tolerated treatment well    Behavior During Therapy WFL for tasks assessed/performed                Past Medical History:  Diagnosis Date   Contact dermatitis and other eczema due to plants (except food)    H/O: CVA (cardiovascular accident)    Carotid doppler neg (4/08)   Hematuria    Obesity, unspecified    Other and unspecified hyperlipidemia    Hx - no medication/diet controll and weight loss   Sacroiliitis, not elsewhere classified ([place])    Sleep apnea    uses CPAP but not every night   Unspecified essential hypertension    Varicose veins of left lower extremity    Past Surgical History:  Procedure Laterality Date   Carotid dopplers  11/16/2006   Negative   HERNIA REPAIR     umbilical   TOTAL KNEE ARTHROPLASTY Left 06/18/2022   TOTAL KNEE ARTHROPLASTY Left 06/18/2022   Procedure: LEFT TOTAL KNEE ARTHROPLASTY;  Surgeon: Leandrew Koyanagi, MD;  Location: [facility];  Service: Orthopedics;  Laterality: Left;   Patient Active Problem List   Diagnosis Date Noted   Status post total left knee replacement 06/18/2022   Primary osteoarthritis of left knee 01/01/2022   Primary osteoarthritis of right knee 01/01/2022   Left knee pain 11/91/4782   Umbilical hernia 95/62/1308   ED (erectile dysfunction) 12/31/2020   Insomnia 04/02/2020   Right ankle swelling 07/25/2019   Gout 11/03/2018   Chronic pain  of right ankle 08/31/2018   Routine general medical examination at a health care facility 11/01/2017   Prostate cancer screening 06/28/2017   Alopecia 11/04/2016   Back pain 05/06/2016   Sleep disorder 08/28/2015   Colon cancer screening 08/28/2015   Prediabetes 01/02/2013   Varicosities of leg 03/21/2012   Rectus diastasis of lower abdomen 03/21/2012   Obstructive sleep apnea 10/15/2009   Class 2 severe obesity due to excess calories with serious comorbidity and body mass index (BMI) of 38.0 to 38.9 in adult [facility]) 09/02/2009   Hyperlipidemia 02/02/2007   HEMATURIA, MICROSCOPIC 02/02/2007   Essential hypertension 02/01/2007   CEREBROVASCULAR ACCIDENT, HX OF 02/01/2007    PCP: Abner Greenspan, MD  REFERRING PROVIDER: Leandrew Koyanagi, MD  REFERRING DIAG: [phone] (ICD-10-CM) - Status post total left knee replacement   THERAPY DIAG:  Stiffness of left knee, not elsewhere classified  Acute pain of left knee  Muscle weakness (generalized)  Other abnormalities of gait and mobility  Localized edema  Unsteadiness on feet  Rationale for Evaluation and Treatment: Rehabilitation  ONSET DATE: 06/18/2022 Left TKA  SUBJECTIVE:   SUBJECTIVE STATEMENT: Pt states that has been monitoring his BP, last night it was 99/44. He states that it has steadily been raising over  night.   PERTINENT HISTORY: OA, back pain, obesity, HTN, CVA, contact dermatitis  PAIN:  Are you having pain? Yes: NPRS scale: 4/10 today, and since last PT lowest 3/10 & highest 6/10 Pain location: L knee in general  Pain description: tight "like something is pulling on it", still numb too  Aggravating factors: bending, turning over in sleep Relieving factors: meds, ice, elevation, pillows between legs during sleep, CPM   PRECAUTIONS: None  WEIGHT BEARING RESTRICTIONS: No  FALLS:  Has patient fallen in last 6 months? No  LIVING ENVIRONMENT: Lives with: lives with their spouse and adult niece Lives in:  House Stairs: Yes: Internal: 14 steps; on left going up and External: 4 steps; on right going up  bathroom & bedroom main level, his fish aquarium is upstairs. 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  PLOF: Independent, Independent with household mobility without device, and Independent with community mobility without device  PATIENT GOALS:  return to work, household activities.  NEXT MD VISIT: 07/31/2022  OBJECTIVE:   OBJECTIVE:   DIAGNOSTIC FINDINGS: 06/18/2022 - Postsurgical changes from right knee arthroplasty. Evidence of periprosthetic fracture. No loosening. Overlying external material limits the ability to assess for soft tissue abnormality. Within this limitation no focal abnormality is visualized.  PATIENT SURVEYS:  FOTO intake: 61%   predicted:  70%  COGNITION: Overall cognitive status: WFL   EDEMA:  RLE: above knee 47.5cm  around knee 45cm  below knee 42cm LLE: above knee 56.3cm  around knee 55cm below knee 44cm  POSTURE: rounded shoulders, forward head, flexed trunk , and weight shift right  PALPATION: Tenderness along joint line & scar. Steri-strips in place. Small scabs still present on incision.   LOWER EXTREMITY ROM:   ROM Left eval Left  07/15/22  Hip flexion    Hip extension    Hip abduction    Hip adduction    Hip internal rotation    Hip external rotation    Knee flexion Supine  P: 94* Seated P:111* A: 102*  Knee extension Seated: LAQ A: -18* Supine  P: -6* A: quad set -9* Seated: LAQ A: -9* Supine  P: -3* A: quad set -4*  Ankle dorsiflexion    Ankle plantarflexion    Ankle inversion    Ankle eversion     (Blank rows = not tested)  LOWER EXTREMITY MMT:  MMT Right eval Left eval  Hip flexion    Hip extension    Hip abduction    Hip adduction    Hip internal rotation    Hip  external rotation    Knee flexion  3-/5  Knee extension  3-/5  Ankle dorsiflexion    Ankle plantarflexion    Ankle inversion    Ankle eversion     (Blank rows = not tested)  FUNCTIONAL TESTS:  18 inch chair transfer: minA using BUEs on armrests to RW  GAIT: Distance walked: 80' Assistive device utilized: Environmental consultant - 2 wheeled Level of assistance: SBA Comments: excessive weight bearing BUEs and PWB on LLE   TODAY'S TREATMENT  DATE:  07/17/2022 Seated BP 141/69 HR 99  Therapeutic Exercise: Nustep, lvl 7, 5 min Hamstring stretch on leg press machine strap SLR with PT manual assist for knee ext 30 sec hold 2 reps Gastroc stretch on incline board 30 sec hold 2 reps Heel raises on incline board with LBE support reaching RUE overhead 10 reps Seated LAQ & active knee flexion with opposing motion contralateral 5 sec hold 15 reps Supine quad set 5 sec hold 15 reps   Manual Therapy: Seated Flexion PROM with overpressure manual traction & tibial internal rotation, reaching 103 today.  Supine ext grade IV mob.  Vasopneumatic: Left knee medium compression 34* 10 min with BLEs elevated.  DATE:  07/14/2022 Seated BP 147/87 HR 107  Therapeutic Exercise: Leg Press BLEs 100# 10 reps, 2 sets  Hamstring stretch on leg press machine strap SLR with PT manual assist for knee ext 30 sec hold 2 reps Gastroc stretch on incline board 30 sec hold 2 reps Heel raises on incline board with LBE support reaching RUE overhead 10 reps Seated LAQ & active knee flexion with opposing motion contralateral 5 sec hold 15 reps Supine quad set 5 sec hold 15 reps  Neuromuscular Re-education Tandem stance on foam 30 sec ea LLE in front & in back  Gait Training: Pt amb 50' without AD with supervision. No LOB or knee instability noted.   Manual Therapy: Seated Flexion PROM with overpressure manual traction & tibial internal rotation.   Supine ext grade IV mob.  Vasopneumatic: Left knee medium compression 34* 10 min with BLEs elevated.  07/13/2022 Therapeutic Exercise: SciFit Bike seat 13 level 1 with full revolutions with BLEs & BUEs 6 min & BLEs only 2 min Gastroc stretch on step heel depression 30 sec hold 2 reps Heel raises with LUE support reaching RUE overhead 10 reps Hamstring stretch seated LLE long sit strap DF 30 sec hold 2 reps Quad stretch hooklying supine LLE over edge strap knee flexion 30 sec 2 reps  Gait Training: Pre-gait stepping over 4" block X 12" long working on terminal stance & swing flexion and stance extension weighting LLE. Carryover of above for gait with cane.   Manual Therapy: Flexion PROM with overpressure manual traction & tibial internal rotation.   Self-care: PT demo & verbal cues on scar mobs including rationale. Pt verbalized understanding. Light headed -Pt standing so PT had pt lay supine - BP 115/68 HR 123 no chest pain. & light-headedness subsided. Supine >10 min BP 107/65 HR 108 no symptoms Standing BP 69/57 HR 143 light headedness.  PT educated pt on using urine to monitor for dehydration. Needs to drink plenty of water. Pt verbalized understanding.  Pt educated on supine to sit, sitting until dizziness clears. Then standing until clears. Keep eyes open focusing on non-moving object.   Vasopneumatic: Left knee medium compression 34* 10 min with BLEs elevated.  PATIENT EDUCATION:  Education details: interventions to reduce guarding, general progression in PT after surgery, where to get ortho bolster for use at home  Person educated: Patient Education method: Explanation, Demonstration, Verbal cues, and Handouts Education comprehension: verbalized understanding, returned demonstration, and verbal cues required  HOME EXERCISE PROGRAM: Access Code: [email or web address] URL: [email or web address] Date: 07/07/2022 Prepared by: Jamey Reas  Exercises - Ankle Alphabet  in Elevation  - 2-4 x daily - 7 x weekly - 1 sets - 1 reps - Quad Setting and Stretching  - 2-4 x daily - 7 x weekly - 5-10 sets - 10 reps -  prop 5-10 minutes & quad set5 seconds hold - Supine Leg Press  - 2-4 x daily - 7 x weekly - 5-10 sets - 10 reps - 5 seconds hold - Supine Heel Slide with Strap  - 2-3 x daily - 7 x weekly - 2-3 sets - 10 reps - 5 seconds hold - Supine Straight Leg Raises  - 2-3 x daily - 7 x weekly - 2-3 sets - 10 reps - 5 seconds hold - Seated Knee Flexion Extension AROM   - 2-4 x daily - 7 x weekly - 2-3 sets - 10 reps - 5 seconds hold - Seated straight leg lifts  - 2-3 x daily - 7 x weekly - 2-3 sets - 10 reps - 5 seconds hold - Seated Hamstring Stretch with Strap  - 2-4 x daily - 7 x weekly - 1 sets - 3 reps - 20-30 seconds hold  ASSESSMENT: CLINICAL IMPRESSION: Patient's range of motion continues to improve with PROM, he continues to be limited with AROM due to swelling and pain. He is responding well to manual therapy & exercises. HR monitored throughout session with improvements noted throughout session. Pt encouraged to follow up with MD regarding fluctuations.    OBJECTIVE IMPAIRMENTS: Abnormal gait, decreased activity tolerance, decreased balance, decreased endurance, decreased knowledge of condition, decreased knowledge of use of DME, decreased mobility, difficulty walking, decreased ROM, decreased strength, increased edema, increased muscle spasms, impaired flexibility, obesity, and pain.   ACTIVITY LIMITATIONS: carrying, lifting, bending, sitting, standing, squatting, sleeping, stairs, transfers, bed mobility, and locomotion level  PARTICIPATION LIMITATIONS: meal prep, cleaning, community activity, occupation, and yard work  PERSONAL FACTORS: 3+ comorbidities: see PMH  are also affecting patient's functional outcome.   REHAB POTENTIAL: Good  CLINICAL DECISION MAKING: Stable/uncomplicated  EVALUATION COMPLEXITY: Low   GOALS: Goals reviewed with patient?  Yes  SHORT TERM GOALS: (target date 08/07/2022)   1.  Patient will demonstrate independent use of home exercise program to maintain progress from in clinic treatments. Goal status: New  2. Patient reports 25% improvement in left knee pain.  Goal Status: INITIAL  3. Left knee PROM -3* ext to 100* flex  LONG TERM GOALS: (target dates for all long term goals are 10 weeks  09/18/2021 )   1. Patient will demonstrate/report pain at worst less than or equal to 2/10 to facilitate minimal limitation in daily activity secondary to pain symptoms.  Goal status: New   2. Patient will demonstrate independent use of home exercise program to facilitate ability to maintain/progress functional gains from skilled physical therapy services.  Goal status: New   3. Patient will demonstrate FOTO outcome > or = 70 % to indicate reduced disability due to condition.  Goal status: New   4.  Patient will demonstrate left LE MMT 5/5 throughout to faciltiate usual transfers, stairs, squatting at [facility] for daily life.   Goal status: New   5.  Patient will demonstrate ability to perform work task of lifting & pushing weighted cart.  Goal status: New   6.  patient ambulates >500', negotiates ramps, curbs & stairs single rail independent without assistive device.  Goal status: New   7.  Left knee AROM 0* ext standing and 110* flexion seated. Goal Status: New   PLAN:  PT FREQUENCY: 2-3x/week  PT DURATION: 10 weeks  PLANNED INTERVENTIONS: Therapeutic exercises, Therapeutic activity, Neuro Muscular re-education, Balance training, Gait training, Patient/Family education, Joint mobilization, Stair training, DME instructions, Dry Needling, Electrical stimulation, Traction, Cryotherapy, vasopneumatic device,  Moist heat, Taping, Ultrasound, Ionotophoresis '4mg'$ /ml Dexamethasone, and Manual therapy.  All included unless contraindicated  PLAN FOR NEXT SESSION: Update HEP to include muscle stretches.  Manual therapy &  exercise for range. Progress to standing functional exercises.    Lynden Ang, PT, DPT 07/17/2022, 8:43 AM

## 2022-07-17 ENCOUNTER — Ambulatory Visit (INDEPENDENT_AMBULATORY_CARE_PROVIDER_SITE_OTHER): Payer: 59 | Admitting: Physical Therapy

## 2022-07-17 ENCOUNTER — Telehealth: Payer: Self-pay | Admitting: Orthopaedic Surgery

## 2022-07-17 ENCOUNTER — Encounter: Payer: Self-pay | Admitting: Physical Therapy

## 2022-07-17 DIAGNOSIS — M25662 Stiffness of left knee, not elsewhere classified: Secondary | ICD-10-CM

## 2022-07-17 DIAGNOSIS — M6281 Muscle weakness (generalized): Secondary | ICD-10-CM | POA: Diagnosis not present

## 2022-07-17 DIAGNOSIS — R2681 Unsteadiness on feet: Secondary | ICD-10-CM

## 2022-07-17 DIAGNOSIS — M25562 Pain in left knee: Secondary | ICD-10-CM

## 2022-07-17 DIAGNOSIS — R600 Localized edema: Secondary | ICD-10-CM

## 2022-07-17 DIAGNOSIS — R2689 Other abnormalities of gait and mobility: Secondary | ICD-10-CM | POA: Diagnosis not present

## 2022-07-17 NOTE — Telephone Encounter (Signed)
Called and left voice mail

## 2022-07-17 NOTE — Telephone Encounter (Signed)
Pt called and has some questions about his medication  FX [phone]

## 2022-07-20 ENCOUNTER — Telehealth: Payer: Self-pay

## 2022-07-20 ENCOUNTER — Ambulatory Visit (INDEPENDENT_AMBULATORY_CARE_PROVIDER_SITE_OTHER): Payer: 59 | Admitting: Physical Therapy

## 2022-07-20 ENCOUNTER — Encounter: Payer: Self-pay | Admitting: Physical Therapy

## 2022-07-20 DIAGNOSIS — M25662 Stiffness of left knee, not elsewhere classified: Secondary | ICD-10-CM | POA: Diagnosis not present

## 2022-07-20 DIAGNOSIS — M25562 Pain in left knee: Secondary | ICD-10-CM

## 2022-07-20 DIAGNOSIS — M6281 Muscle weakness (generalized): Secondary | ICD-10-CM | POA: Diagnosis not present

## 2022-07-20 DIAGNOSIS — R2681 Unsteadiness on feet: Secondary | ICD-10-CM

## 2022-07-20 DIAGNOSIS — R2689 Other abnormalities of gait and mobility: Secondary | ICD-10-CM

## 2022-07-20 DIAGNOSIS — R600 Localized edema: Secondary | ICD-10-CM

## 2022-07-20 NOTE — Telephone Encounter (Signed)
Patient called wanting to know is it normal for his feet to be cold all the time and for him to have numbness in his feet too?  States that it feels like pins and needles.  Patient had left knee surgery on 06/18/2022.  Cb# [phone].  Please advise.  Thank you.

## 2022-07-20 NOTE — Therapy (Signed)
OUTPATIENT PHYSICAL THERAPY LOWER EXTREMITY TREATMENT   Patient Name: Sean Lee MRN: [id number] DOB:September 28, 1960, 61 y.o., male Today's Date: 07/20/2022  END OF SESSION:  PT End of Session - 07/20/22 0800     Visit Number 6    Number of Visits 22    Date for PT Re-Evaluation 09/18/22    Authorization Type [employer]    Authorization Time Period 20% co-insurance    Authorization - Number of Visits 50    Progress Note Due on Visit 10    PT Start Time 0800    PT Stop Time 2595    PT Time Calculation (min) 55 min    Activity Tolerance Patient tolerated treatment well    Behavior During Therapy WFL for tasks assessed/performed                Past Medical History:  Diagnosis Date   Contact dermatitis and other eczema due to plants (except food)    H/O: CVA (cardiovascular accident)    Carotid doppler neg (4/08)   Hematuria    Obesity, unspecified    Other and unspecified hyperlipidemia    Hx - no medication/diet controll and weight loss   Sacroiliitis, not elsewhere classified ([place])    Sleep apnea    uses CPAP but not every night   Unspecified essential hypertension    Varicose veins of left lower extremity    Past Surgical History:  Procedure Laterality Date   Carotid dopplers  11/16/2006   Negative   HERNIA REPAIR     umbilical   TOTAL KNEE ARTHROPLASTY Left 06/18/2022   TOTAL KNEE ARTHROPLASTY Left 06/18/2022   Procedure: LEFT TOTAL KNEE ARTHROPLASTY;  Surgeon: Leandrew Koyanagi, MD;  Location: [facility];  Service: Orthopedics;  Laterality: Left;   Patient Active Problem List   Diagnosis Date Noted   Status post total left knee replacement 06/18/2022   Primary osteoarthritis of left knee 01/01/2022   Primary osteoarthritis of right knee 01/01/2022   Left knee pain 63/87/5643   Umbilical hernia 32/95/1884   ED (erectile dysfunction) 12/31/2020   Insomnia 04/02/2020   Right ankle swelling 07/25/2019   Gout 11/03/2018   Chronic pain of right ankle 08/31/2018    Routine general medical examination at a health care facility 11/01/2017   Prostate cancer screening 06/28/2017   Alopecia 11/04/2016   Back pain 05/06/2016   Sleep disorder 08/28/2015   Colon cancer screening 08/28/2015   Prediabetes 01/02/2013   Varicosities of leg 03/21/2012   Rectus diastasis of lower abdomen 03/21/2012   Obstructive sleep apnea 10/15/2009   Class 2 severe obesity due to excess calories with serious comorbidity and body mass index (BMI) of 38.0 to 38.9 in adult [facility]) 09/02/2009   Hyperlipidemia 02/02/2007   HEMATURIA, MICROSCOPIC 02/02/2007   Essential hypertension 02/01/2007   CEREBROVASCULAR ACCIDENT, HX OF 02/01/2007    PCP: Abner Greenspan, MD  REFERRING PROVIDER: Leandrew Koyanagi, MD  REFERRING DIAG: [phone] (ICD-10-CM) - Status post total left knee replacement   THERAPY DIAG:  Stiffness of left knee, not elsewhere classified  Acute pain of left knee  Muscle weakness (generalized)  Other abnormalities of gait and mobility  Localized edema  Unsteadiness on feet  Rationale for Evaluation and Treatment: Rehabilitation  ONSET DATE: 06/18/2022 Left TKA  SUBJECTIVE:   SUBJECTIVE STATEMENT: He took BP yesterday & no light-headedness.  He forgot to take it this morning but plans to take it when he gets home.  He is taking Oxy &  muscle relaxor still.   PERTINENT HISTORY: OA, back pain, obesity, HTN, CVA, contact dermatitis  PAIN:  Are you having pain? Yes: NPRS scale: 4/10 today, and since last PT lowest 3/10 & highest 6/10 Pain location: L knee in general  Pain description: tight "like something is pulling on it", still numb too  Aggravating factors: bending, turning over in sleep Relieving factors: meds, ice, elevation, pillows between legs during sleep, CPM   PRECAUTIONS: None  WEIGHT BEARING RESTRICTIONS: No  FALLS:  Has patient fallen in last 6 months? No  LIVING ENVIRONMENT: Lives with: lives with their spouse and adult niece Lives in:  House Stairs: Yes: Internal: 14 steps; on left going up and External: 4 steps; on right going up  bathroom & bedroom main level, his fish aquarium is upstairs. 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  PLOF: Independent, Independent with household mobility without device, and Independent with community mobility without device  PATIENT GOALS:  return to work, household activities.  NEXT MD VISIT: 07/31/2022  OBJECTIVE:   OBJECTIVE:   DIAGNOSTIC FINDINGS: 06/18/2022 - Postsurgical changes from right knee arthroplasty. Evidence of periprosthetic fracture. No loosening. Overlying external material limits the ability to assess for soft tissue abnormality. Within this limitation no focal abnormality is visualized.  PATIENT SURVEYS:  FOTO intake: 61%   predicted:  70%  COGNITION: Overall cognitive status: WFL   EDEMA:  RLE: above knee 47.5cm  around knee 45cm  below knee 42cm LLE: above knee 56.3cm  around knee 55cm below knee 44cm  POSTURE: rounded shoulders, forward head, flexed trunk , and weight shift right  PALPATION: Tenderness along joint line & scar. Steri-strips in place. Small scabs still present on incision.   LOWER EXTREMITY ROM:   ROM Left eval Left  07/15/22 Left 07/20/22  Hip flexion     Hip extension     Hip abduction     Hip adduction     Hip internal rotation     Hip external rotation     Knee flexion Supine  P: 94* Seated P:111* A: 102* Seated P:111* A: 103*  Knee extension Seated: LAQ A: -18* Supine  P: -6* A: quad set -9* Seated: LAQ A: -9* Supine  P: -3* A: quad set -4* Seated: LAQ A: -9* Supine  P: -3* A: quad set -4*  Ankle dorsiflexion     Ankle plantarflexion     Ankle inversion     Ankle eversion      (Blank rows = not tested)  LOWER EXTREMITY MMT:  MMT Right eval Left eval   Hip flexion    Hip extension    Hip abduction    Hip adduction    Hip internal rotation    Hip external rotation    Knee flexion  3-/5  Knee extension  3-/5  Ankle dorsiflexion    Ankle plantarflexion    Ankle inversion    Ankle eversion     (Blank rows = not tested)  FUNCTIONAL TESTS:  18 inch chair transfer: minA using BUEs on armrests to RW  GAIT: Distance walked: 80' Assistive device utilized: Environmental consultant - 2 wheeled Level of assistance: SBA Comments: excessive weight bearing BUEs and PWB on LLE   TODAY'S TREATMENT  DATE:  07/20/2022 Seated BP 142/83 HR 99  Therapeutic Exercise: SciFit seat 13 level 1 BLEs/ BUEs 6 min  Leg press BLEs 100# 10 reps;   LLE only 50# 10 reps Seated prop 5# left knee ext 1 min 2 sets Hamstring stretch seated LLE on 2nd chair with strap 20 sec hold 2 reps Gastroc stretch on incline board 30 sec hold 2 reps Heel raises on incline board with LBE support reaching RUE overhead 10 reps Seated LAQ 5# & active knee flexion with opposing motion contralateral 5 sec hold 15 reps PT updated HEP to include stretches including HO. Pt verbalized understanding.   Manual Therapy: Seated Flexion PROM with overpressure manual traction & tibial internal rotation Supine ext grade IV mob.  Vasopneumatic: Left knee medium compression 34* 10 min with BLEs elevated  07/17/2022 Seated BP 141/69 HR 99  Therapeutic Exercise: Nustep, lvl 7, 5 min Hamstring stretch on leg press machine strap SLR with PT manual assist for knee ext 30 sec hold 2 reps Gastroc stretch on incline board 30 sec hold 2 reps Heel raises on incline board with LBE support reaching RUE overhead 10 reps Seated LAQ & active knee flexion with opposing motion contralateral 5 sec hold 15 reps Supine quad set 5 sec hold 15 reps   Manual Therapy: Seated Flexion PROM with overpressure manual traction & tibial internal  rotation, reaching 103 today.  Supine ext grade IV mob.  Vasopneumatic: Left knee medium compression 34* 10 min with BLEs elevated.   07/14/2022 Seated BP 147/87 HR 107  Therapeutic Exercise: Leg Press BLEs 100# 10 reps, 2 sets  Hamstring stretch on leg press machine strap SLR with PT manual assist for knee ext 30 sec hold 2 reps Gastroc stretch on incline board 30 sec hold 2 reps Heel raises on incline board with LBE support reaching RUE overhead 10 reps Seated LAQ & active knee flexion with opposing motion contralateral 5 sec hold 15 reps Supine quad set 5 sec hold 15 reps  Neuromuscular Re-education Tandem stance on foam 30 sec ea LLE in front & in back  Gait Training: Pt amb 50' without AD with supervision. No LOB or knee instability noted.   Manual Therapy: Seated Flexion PROM with overpressure manual traction & tibial internal rotation.  Supine ext grade IV mob.  Vasopneumatic: Left knee medium compression 34* 10 min with BLEs elevated.   HOME EXERCISE PROGRAM: Access Code: [email or web address] URL: [email or web address] Date: 07/20/2022 Prepared by: Jamey Reas  Exercises - Ankle Alphabet in Elevation  - 2-4 x daily - 7 x weekly - 1 sets - 1 reps - Quad Setting and Stretching  - 2-4 x daily - 7 x weekly - 5-10 sets - 10 reps - prop 5-10 minutes & quad set5 seconds hold - Supine Leg Press  - 2-4 x daily - 7 x weekly - 5-10 sets - 10 reps - 5 seconds hold - Supine Heel Slide with Strap  - 2-3 x daily - 7 x weekly - 2-3 sets - 10 reps - 5 seconds hold - Supine Straight Leg Raises  - 2-3 x daily - 7 x weekly - 2-3 sets - 10 reps - 5 seconds hold - Seated Knee Flexion Extension AROM   - 2-4 x daily - 7 x weekly - 2-3 sets - 10 reps - 5 seconds hold - Seated straight leg lifts  - 2-3 x daily - 7 x weekly - 2-3 sets - 10 reps - 5 seconds hold - Seated  Hamstring Stretch with Strap  - 2-4 x daily - 7 x weekly - 1 sets - 3 reps - 20-30 seconds hold - Seated Passive  Knee Extension with Weight  - 1-3 x daily - 7 x weekly - 1 sets - 2-3 reps - 1-2 minutes hold - Seated Hamstring Stretch with Chair  - 1-3 x daily - 7 x weekly - 1 sets - 2-3 reps - 20-30 seconds hold - Gastroc Stretch on Step  - 1-3 x daily - 7 x weekly - 1 sets - 2-3 reps - 20-30 seconds hold - Supine Quadriceps Stretch with Strap on Table  - 1-3 x daily - 7 x weekly - 1 sets - 2-3 reps - 20-30 seconds hold  ASSESSMENT: CLINICAL IMPRESSION: PT updated HEP to include stretches which he appears to understand.  Patient is improving knee functional strength.    OBJECTIVE IMPAIRMENTS: Abnormal gait, decreased activity tolerance, decreased balance, decreased endurance, decreased knowledge of condition, decreased knowledge of use of DME, decreased mobility, difficulty walking, decreased ROM, decreased strength, increased edema, increased muscle spasms, impaired flexibility, obesity, and pain.   ACTIVITY LIMITATIONS: carrying, lifting, bending, sitting, standing, squatting, sleeping, stairs, transfers, bed mobility, and locomotion level  PARTICIPATION LIMITATIONS: meal prep, cleaning, community activity, occupation, and yard work  PERSONAL FACTORS: 3+ comorbidities: see PMH  are also affecting patient's functional outcome.   REHAB POTENTIAL: Good  CLINICAL DECISION MAKING: Stable/uncomplicated  EVALUATION COMPLEXITY: Low   GOALS: Goals reviewed with patient? Yes  SHORT TERM GOALS: (target date 08/07/2022)   1.  Patient will demonstrate independent use of home exercise program to maintain progress from in clinic treatments. Goal status: New  2. Patient reports 25% improvement in left knee pain.  Goal Status: INITIAL  3. Left knee PROM -3* ext to 100* flex  LONG TERM GOALS: (target dates for all long term goals are 10 weeks  09/18/2021 )   1. Patient will demonstrate/report pain at worst less than or equal to 2/10 to facilitate minimal limitation in daily activity secondary to pain  symptoms.  Goal status: New   2. Patient will demonstrate independent use of home exercise program to facilitate ability to maintain/progress functional gains from skilled physical therapy services.  Goal status: New   3. Patient will demonstrate FOTO outcome > or = 70 % to indicate reduced disability due to condition.  Goal status: New   4.  Patient will demonstrate left LE MMT 5/5 throughout to faciltiate usual transfers, stairs, squatting at [facility] for daily life.   Goal status: New   5.  Patient will demonstrate ability to perform work task of lifting & pushing weighted cart.  Goal status: New   6.  patient ambulates >500', negotiates ramps, curbs & stairs single rail independent without assistive device.  Goal status: New   7.  Left knee AROM 0* ext standing and 110* flexion seated. Goal Status: New   PLAN:  PT FREQUENCY: 2-3x/week  PT DURATION: 10 weeks  PLANNED INTERVENTIONS: Therapeutic exercises, Therapeutic activity, Neuro Muscular re-education, Balance training, Gait training, Patient/Family education, Joint mobilization, Stair training, DME instructions, Dry Needling, Electrical stimulation, Traction, Cryotherapy, vasopneumatic device, Moist heat, Taping, Ultrasound, Ionotophoresis '4mg'$ /ml Dexamethasone, and Manual therapy.  All included unless contraindicated  PLAN FOR NEXT SESSION: continue Manual therapy & exercise for range. Progress to standing functional exercises. Standing balance.    Jamey Reas, PT, DPT 07/20/2022, 4:14 PM

## 2022-07-20 NOTE — Telephone Encounter (Signed)
Could be due to swelling.

## 2022-07-21 NOTE — Telephone Encounter (Signed)
Called and notified patient.

## 2022-07-21 NOTE — Telephone Encounter (Signed)
Could be swelling or neuropathy.  May want to see his PCP

## 2022-07-23 ENCOUNTER — Ambulatory Visit (INDEPENDENT_AMBULATORY_CARE_PROVIDER_SITE_OTHER): Payer: 59 | Admitting: Physical Therapy

## 2022-07-23 ENCOUNTER — Encounter: Payer: Self-pay | Admitting: Physical Therapy

## 2022-07-23 DIAGNOSIS — M25562 Pain in left knee: Secondary | ICD-10-CM | POA: Diagnosis not present

## 2022-07-23 DIAGNOSIS — M25662 Stiffness of left knee, not elsewhere classified: Secondary | ICD-10-CM

## 2022-07-23 DIAGNOSIS — R600 Localized edema: Secondary | ICD-10-CM

## 2022-07-23 DIAGNOSIS — R2681 Unsteadiness on feet: Secondary | ICD-10-CM

## 2022-07-23 DIAGNOSIS — M6281 Muscle weakness (generalized): Secondary | ICD-10-CM | POA: Diagnosis not present

## 2022-07-23 DIAGNOSIS — R2689 Other abnormalities of gait and mobility: Secondary | ICD-10-CM | POA: Diagnosis not present

## 2022-07-23 NOTE — Therapy (Signed)
OUTPATIENT PHYSICAL THERAPY LOWER EXTREMITY TREATMENT   Patient Name: Sean Lee MRN: [id number] DOB:Mar 21, 1961, 61 y.o., male Today's Date: 07/23/2022  END OF SESSION:  PT End of Session - 07/23/22 0809     Visit Number 7    Number of Visits 22    Date for PT Re-Evaluation 09/18/22    Authorization Type [employer]    Authorization Time Period 20% co-insurance    Authorization - Number of Visits 50    Progress Note Due on Visit 10    PT Start Time 0805    PT Stop Time 0846    PT Time Calculation (min) 41 min    Activity Tolerance Patient tolerated treatment well    Behavior During Therapy WFL for tasks assessed/performed                 Past Medical History:  Diagnosis Date   Contact dermatitis and other eczema due to plants (except food)    H/O: CVA (cardiovascular accident)    Carotid doppler neg (4/08)   Hematuria    Obesity, unspecified    Other and unspecified hyperlipidemia    Hx - no medication/diet controll and weight loss   Sacroiliitis, not elsewhere classified ([place])    Sleep apnea    uses CPAP but not every night   Unspecified essential hypertension    Varicose veins of left lower extremity    Past Surgical History:  Procedure Laterality Date   Carotid dopplers  11/16/2006   Negative   HERNIA REPAIR     umbilical   TOTAL KNEE ARTHROPLASTY Left 06/18/2022   TOTAL KNEE ARTHROPLASTY Left 06/18/2022   Procedure: LEFT TOTAL KNEE ARTHROPLASTY;  Surgeon: Leandrew Koyanagi, MD;  Location: [facility];  Service: Orthopedics;  Laterality: Left;   Patient Active Problem List   Diagnosis Date Noted   Status post total left knee replacement 06/18/2022   Primary osteoarthritis of left knee 01/01/2022   Primary osteoarthritis of right knee 01/01/2022   Left knee pain 18/84/1660   Umbilical hernia 63/08/6008   ED (erectile dysfunction) 12/31/2020   Insomnia 04/02/2020   Right ankle swelling 07/25/2019   Gout 11/03/2018   Chronic pain of right ankle 08/31/2018    Routine general medical examination at a health care facility 11/01/2017   Prostate cancer screening 06/28/2017   Alopecia 11/04/2016   Back pain 05/06/2016   Sleep disorder 08/28/2015   Colon cancer screening 08/28/2015   Prediabetes 01/02/2013   Varicosities of leg 03/21/2012   Rectus diastasis of lower abdomen 03/21/2012   Obstructive sleep apnea 10/15/2009   Class 2 severe obesity due to excess calories with serious comorbidity and body mass index (BMI) of 38.0 to 38.9 in adult [facility]) 09/02/2009   Hyperlipidemia 02/02/2007   HEMATURIA, MICROSCOPIC 02/02/2007   Essential hypertension 02/01/2007   CEREBROVASCULAR ACCIDENT, HX OF 02/01/2007    PCP: Abner Greenspan, MD  REFERRING PROVIDER: Leandrew Koyanagi, MD  REFERRING DIAG: [phone] (ICD-10-CM) - Status post total left knee replacement   THERAPY DIAG:  Stiffness of left knee, not elsewhere classified  Acute pain of left knee  Muscle weakness (generalized)  Other abnormalities of gait and mobility  Localized edema  Unsteadiness on feet  Rationale for Evaluation and Treatment: Rehabilitation  ONSET DATE: 06/18/2022 Left TKA  SUBJECTIVE:   SUBJECTIVE STATEMENT: Knee is a little more stiff today.  Thinks he overdid it yesterday.  PERTINENT HISTORY: OA, back pain, obesity, HTN, CVA, contact dermatitis  PAIN:  Are you  having pain? Yes: NPRS scale: 2-3/10 today, and since last PT lowest 3/10 & highest 6/10 Pain location: L knee in general  Pain description: tight "like something is pulling on it", still numb too  Aggravating factors: bending, turning over in sleep Relieving factors: meds, ice, elevation, pillows between legs during sleep, CPM   PRECAUTIONS: None  WEIGHT BEARING RESTRICTIONS: No  FALLS:  Has patient fallen in last 6 months? No  LIVING ENVIRONMENT: Lives with: lives with their spouse and adult niece Lives in: House Stairs: Yes: Internal: 14 steps; on left going up and External: 4 steps; on right  going up  bathroom & bedroom main level, his fish aquarium is upstairs. 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  PLOF: Independent, Independent with household mobility without device, and Independent with community mobility without device  PATIENT GOALS:  return to work, household activities.  NEXT MD VISIT: 07/31/2022  OBJECTIVE:   OBJECTIVE:   DIAGNOSTIC FINDINGS: 06/18/2022 - Postsurgical changes from right knee arthroplasty. Evidence of periprosthetic fracture. No loosening. Overlying external material limits the ability to assess for soft tissue abnormality. Within this limitation no focal abnormality is visualized.  PATIENT SURVEYS:  EVAL: FOTO intake: 61%   predicted:  70%  EDEMA:  RLE: above knee 47.5cm  around knee 45cm  below knee 42cm LLE: above knee 56.3cm  around knee 55cm below knee 44cm  POSTURE: rounded shoulders, forward head, flexed trunk , and weight shift right  PALPATION: Tenderness along joint line & scar. Steri-strips in place. Small scabs still present on incision.   LOWER EXTREMITY ROM:   ROM Left eval Left  07/15/22 Left 07/20/22  Knee flexion Supine  P: 94* Seated P:111* A: 102* Seated P:111* A: 103*  Knee extension Seated: LAQ A: -18* Supine  P: -6* A: quad set -9* Seated: LAQ A: -9* Supine  P: -3* A: quad set -4* Seated: LAQ A: -9* Supine  P: -3* A: quad set -4*   (Blank rows = not tested)  LOWER EXTREMITY MMT:  MMT Right eval Left eval  Knee flexion  3-/5  Knee extension  3-/5   (Blank rows = not tested)  FUNCTIONAL TESTS:  18 inch chair transfer: minA using BUEs on armrests to RW  GAIT: Distance walked: 80' Assistive device utilized: Environmental consultant - 2 wheeled Level of assistance: SBA Comments: excessive weight bearing BUEs and PWB on LLE   TODAY'S TREATMENT                                                                            DATE:  07/23/22 BP sitting 127/75  Therapeutic Exercise: SciFit seat 10 level 2 BLEs/ BUEs 8 min  Leg Press bil 100# 2x10; LLE only 50# 2x10 Forward step ups with LLE 2x10 on 6" step; light UE support with focus on TKE before bringing Rt foot up to step Seated LAQ 2x10; 5# on Lt with 5 sec hold   07/20/2022 Seated BP 142/83 HR 99  Therapeutic Exercise: SciFit seat 13 level 1 BLEs/ BUEs 6 min  Leg press BLEs 100#  10 reps;   LLE only 50# 10 reps Seated prop 5# left knee ext 1 min 2 sets Hamstring stretch seated LLE on 2nd chair with strap 20 sec hold 2 reps Gastroc stretch on incline board 30 sec hold 2 reps Heel raises on incline board with LBE support reaching RUE overhead 10 reps Seated LAQ 5# & active knee flexion with opposing motion contralateral 5 sec hold 15 reps PT updated HEP to include stretches including HO. Pt verbalized understanding.   Manual Therapy: Seated Flexion PROM with overpressure manual traction & tibial internal rotation Supine ext grade IV mob.  Vasopneumatic: Left knee medium compression 34* 10 min with BLEs elevated  07/17/2022 Seated BP 141/69 HR 99  Therapeutic Exercise: Nustep, lvl 7, 5 min Hamstring stretch on leg press machine strap SLR with PT manual assist for knee ext 30 sec hold 2 reps Gastroc stretch on incline board 30 sec hold 2 reps Heel raises on incline board with LBE support reaching RUE overhead 10 reps Seated LAQ & active knee flexion with opposing motion contralateral 5 sec hold 15 reps Supine quad set 5 sec hold 15 reps   Manual Therapy: Seated Flexion PROM with overpressure manual traction & tibial internal rotation, reaching 103 today.  Supine ext grade IV mob.  Vasopneumatic: Left knee medium compression 34* 10 min with BLEs elevated.   07/14/2022 Seated BP 147/87 HR 107  Therapeutic Exercise: Leg Press BLEs 100# 10 reps, 2 sets  Hamstring stretch on leg  press machine strap SLR with PT manual assist for knee ext 30 sec hold 2 reps Gastroc stretch on incline board 30 sec hold 2 reps Heel raises on incline board with LBE support reaching RUE overhead 10 reps Seated LAQ & active knee flexion with opposing motion contralateral 5 sec hold 15 reps Supine quad set 5 sec hold 15 reps  Neuromuscular Re-education Tandem stance on foam 30 sec ea LLE in front & in back  Gait Training: Pt amb 50' without AD with supervision. No LOB or knee instability noted.   Manual Therapy: Seated Flexion PROM with overpressure manual traction & tibial internal rotation.  Supine ext grade IV mob.  Vasopneumatic: Left knee medium compression 34* 10 min with BLEs elevated.   HOME EXERCISE PROGRAM: Access Code: [email or web address] URL: [email or web address] Date: 07/20/2022 Prepared by: Jamey Reas  Exercises - Ankle Alphabet in Elevation  - 2-4 x daily - 7 x weekly - 1 sets - 1 reps - Quad Setting and Stretching  - 2-4 x daily - 7 x weekly - 5-10 sets - 10 reps - prop 5-10 minutes & quad set5 seconds hold - Supine Leg Press  - 2-4 x daily - 7 x weekly - 5-10 sets - 10 reps - 5 seconds hold - Supine Heel Slide with Strap  - 2-3 x daily - 7 x weekly - 2-3 sets - 10 reps - 5 seconds hold - Supine Straight Leg Raises  - 2-3 x daily - 7 x weekly - 2-3 sets - 10 reps - 5 seconds hold - Seated Knee Flexion Extension AROM   - 2-4 x daily - 7 x weekly - 2-3 sets - 10 reps - 5 seconds hold - Seated straight leg lifts  - 2-3 x daily - 7 x weekly - 2-3 sets - 10 reps - 5 seconds hold - Seated Hamstring Stretch with Strap  - 2-4 x daily - 7 x weekly - 1 sets - 3 reps - 20-30 seconds hold - Seated  Passive Knee Extension with Weight  - 1-3 x daily - 7 x weekly - 1 sets - 2-3 reps - 1-2 minutes hold - Seated Hamstring Stretch with Chair  - 1-3 x daily - 7 x weekly - 1 sets - 2-3 reps - 20-30 seconds hold - Gastroc Stretch on Step  - 1-3 x daily - 7 x weekly - 1 sets -  2-3 reps - 20-30 seconds hold - Supine Quadriceps Stretch with Strap on Table  - 1-3 x daily - 7 x weekly - 1 sets - 2-3 reps - 20-30 seconds hold  ASSESSMENT: CLINICAL IMPRESSION: Pt tolerated session well today focusing on strengthening for functional activities.  Will continue to benefit from PT to maximize function.  OBJECTIVE IMPAIRMENTS: Abnormal gait, decreased activity tolerance, decreased balance, decreased endurance, decreased knowledge of condition, decreased knowledge of use of DME, decreased mobility, difficulty walking, decreased ROM, decreased strength, increased edema, increased muscle spasms, impaired flexibility, obesity, and pain.   ACTIVITY LIMITATIONS: carrying, lifting, bending, sitting, standing, squatting, sleeping, stairs, transfers, bed mobility, and locomotion level  PARTICIPATION LIMITATIONS: meal prep, cleaning, community activity, occupation, and yard work  PERSONAL FACTORS: 3+ comorbidities: see PMH  are also affecting patient's functional outcome.   REHAB POTENTIAL: Good  CLINICAL DECISION MAKING: Stable/uncomplicated  EVALUATION COMPLEXITY: Low   GOALS: Goals reviewed with patient? Yes  SHORT TERM GOALS: (target date 08/07/2022)   1.  Patient will demonstrate independent use of home exercise program to maintain progress from in clinic treatments. Goal status: New  2. Patient reports 25% improvement in left knee pain.  Goal Status: INITIAL  3. Left knee PROM -3* ext to 100* flex  LONG TERM GOALS: (target dates for all long term goals are 10 weeks  09/18/2021 )   1. Patient will demonstrate/report pain at worst less than or equal to 2/10 to facilitate minimal limitation in daily activity secondary to pain symptoms.  Goal status: New   2. Patient will demonstrate independent use of home exercise program to facilitate ability to maintain/progress functional gains from skilled physical therapy services.  Goal status: New   3. Patient will  demonstrate FOTO outcome > or = 70 % to indicate reduced disability due to condition.  Goal status: New   4.  Patient will demonstrate left LE MMT 5/5 throughout to faciltiate usual transfers, stairs, squatting at [facility] for daily life.   Goal status: New   5.  Patient will demonstrate ability to perform work task of lifting & pushing weighted cart.  Goal status: New   6.  patient ambulates >500', negotiates ramps, curbs & stairs single rail independent without assistive device.  Goal status: New   7.  Left knee AROM 0* ext standing and 110* flexion seated. Goal Status: New   PLAN:  PT FREQUENCY: 2-3x/week  PT DURATION: 10 weeks  PLANNED INTERVENTIONS: Therapeutic exercises, Therapeutic activity, Neuro Muscular re-education, Balance training, Gait training, Patient/Family education, Joint mobilization, Stair training, DME instructions, Dry Needling, Electrical stimulation, Traction, Cryotherapy, vasopneumatic device, Moist heat, Taping, Ultrasound, Ionotophoresis '4mg'$ /ml Dexamethasone, and Manual therapy.  All included unless contraindicated  PLAN FOR NEXT SESSION: measure, will need MD note; continue Manual therapy & exercise for range. Progress to standing functional exercises. Standing balance.    Faustino Congress, PT, DPT 07/23/2022, 8:46 AM

## 2022-07-27 ENCOUNTER — Encounter: Payer: Self-pay | Admitting: Physical Therapy

## 2022-07-27 ENCOUNTER — Ambulatory Visit (INDEPENDENT_AMBULATORY_CARE_PROVIDER_SITE_OTHER): Payer: 59 | Admitting: Physical Therapy

## 2022-07-27 ENCOUNTER — Telehealth: Payer: Self-pay | Admitting: Family Medicine

## 2022-07-27 DIAGNOSIS — M25562 Pain in left knee: Secondary | ICD-10-CM

## 2022-07-27 DIAGNOSIS — M25662 Stiffness of left knee, not elsewhere classified: Secondary | ICD-10-CM | POA: Diagnosis not present

## 2022-07-27 DIAGNOSIS — R2681 Unsteadiness on feet: Secondary | ICD-10-CM

## 2022-07-27 DIAGNOSIS — M6281 Muscle weakness (generalized): Secondary | ICD-10-CM

## 2022-07-27 DIAGNOSIS — R2689 Other abnormalities of gait and mobility: Secondary | ICD-10-CM

## 2022-07-27 DIAGNOSIS — R600 Localized edema: Secondary | ICD-10-CM

## 2022-07-27 NOTE — Telephone Encounter (Signed)
Ashok Norris called from [facility] and sending a fax about visits for left knee appointments. Call back number [phone].

## 2022-07-27 NOTE — Therapy (Signed)
OUTPATIENT PHYSICAL THERAPY LOWER EXTREMITY TREATMENT   Patient Name: Sean Lee MRN: [id number] DOB:August 03, 1961, 61 y.o., male Today's Date: 07/27/2022  END OF SESSION:  PT End of Session - 07/27/22 0803     Visit Number 8    Number of Visits 22    Date for PT Re-Evaluation 09/18/22    Authorization Type [employer]    Authorization Time Period 20% co-insurance    Authorization - Visit Number 8    Authorization - Number of Visits 50    Progress Note Due on Visit 10    PT Start Time 0800    PT Stop Time 0321    PT Time Calculation (min) 44 min    Activity Tolerance Patient tolerated treatment well    Behavior During Therapy WFL for tasks assessed/performed                 Past Medical History:  Diagnosis Date   Contact dermatitis and other eczema due to plants (except food)    H/O: CVA (cardiovascular accident)    Carotid doppler neg (4/08)   Hematuria    Obesity, unspecified    Other and unspecified hyperlipidemia    Hx - no medication/diet controll and weight loss   Sacroiliitis, not elsewhere classified ([place])    Sleep apnea    uses CPAP but not every night   Unspecified essential hypertension    Varicose veins of left lower extremity    Past Surgical History:  Procedure Laterality Date   Carotid dopplers  11/16/2006   Negative   HERNIA REPAIR     umbilical   TOTAL KNEE ARTHROPLASTY Left 06/18/2022   TOTAL KNEE ARTHROPLASTY Left 06/18/2022   Procedure: LEFT TOTAL KNEE ARTHROPLASTY;  Surgeon: Leandrew Koyanagi, MD;  Location: [place];  Service: Orthopedics;  Laterality: Left;   Patient Active Problem List   Diagnosis Date Noted   Status post total left knee replacement 06/18/2022   Primary osteoarthritis of left knee 01/01/2022   Primary osteoarthritis of right knee 01/01/2022   Left knee pain 22/48/2500   Umbilical hernia 37/11/8887   ED (erectile dysfunction) 12/31/2020   Insomnia 04/02/2020   Right ankle swelling 07/25/2019   Gout 11/03/2018   Chronic  pain of right ankle 08/31/2018   Routine general medical examination at a health care facility 11/01/2017   Prostate cancer screening 06/28/2017   Alopecia 11/04/2016   Back pain 05/06/2016   Sleep disorder 08/28/2015   Colon cancer screening 08/28/2015   Prediabetes 01/02/2013   Varicosities of leg 03/21/2012   Rectus diastasis of lower abdomen 03/21/2012   Obstructive sleep apnea 10/15/2009   Class 2 severe obesity due to excess calories with serious comorbidity and body mass index (BMI) of 38.0 to 38.9 in adult [facility]) 09/02/2009   Hyperlipidemia 02/02/2007   HEMATURIA, MICROSCOPIC 02/02/2007   Essential hypertension 02/01/2007   CEREBROVASCULAR ACCIDENT, HX OF 02/01/2007    PCP: Abner Greenspan, MD  REFERRING PROVIDER: Leandrew Koyanagi, MD  REFERRING DIAG: [phone] (ICD-10-CM) - Status post total left knee replacement   THERAPY DIAG:  Stiffness of left knee, not elsewhere classified  Acute pain of left knee  Other abnormalities of gait and mobility  Muscle weakness (generalized)  Localized edema  Unsteadiness on feet  Rationale for Evaluation and Treatment: Rehabilitation  ONSET DATE: 06/18/2022 Left TKA  SUBJECTIVE:   SUBJECTIVE STATEMENT: He arrived today without cane.  He has been doing his exercises. No issues with light-headedness. He is back on his  BP meds regular dose.   PERTINENT HISTORY: OA, back pain, obesity, HTN, CVA, contact dermatitis  PAIN:  Are you having pain? Yes: NPRS scale:  2/10 today, and since last PT lowest 2/10 & highest 4/10 Pain location: L knee in general  Pain description: tight "like something is pulling on it", still numb too  Aggravating factors: bending, turning over in sleep Relieving factors: meds, ice, elevation, pillows between legs during sleep, CPM   PRECAUTIONS: None  WEIGHT BEARING RESTRICTIONS: No  FALLS:  Has patient fallen in last 6 months? No  LIVING ENVIRONMENT: Lives with: lives with their spouse and adult  niece Lives in: House Stairs: Yes: Internal: 14 steps; on left going up and External: 4 steps; on right going up  bathroom & bedroom main level, his fish aquarium is upstairs. 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  PLOF: Independent, Independent with household mobility without device, and Independent with community mobility without device  PATIENT GOALS:  return to work, household activities.  NEXT MD VISIT: 07/31/2022  OBJECTIVE:   OBJECTIVE:   DIAGNOSTIC FINDINGS: 06/18/2022 - Postsurgical changes from right knee arthroplasty. Evidence of periprosthetic fracture. No loosening. Overlying external material limits the ability to assess for soft tissue abnormality. Within this limitation no focal abnormality is visualized.  PATIENT SURVEYS:  EVAL: FOTO intake: 61%   predicted:  70%  EDEMA:  RLE: above knee 47.5cm  around knee 45cm  below knee 42cm LLE: above knee 56.3cm  around knee 55cm below knee 44cm  POSTURE: rounded shoulders, forward head, flexed trunk , and weight shift right  PALPATION: Tenderness along joint line & scar. Steri-strips in place. Small scabs still present on incision.   LOWER EXTREMITY ROM:   ROM Left eval Left  07/15/22 Left 07/20/22 Left 07/27/22  Knee flexion Supine  P: 94* Seated P:111* A: 102* Seated P:111* A: 103* Seated P:113* A: 103*  Knee extension Seated: LAQ A: -18* Supine  P: -6* A: quad set -9* Seated: LAQ A: -9* Supine  P: -3* A: quad set -4* Seated: LAQ A: -9* Supine  P: -3* A: quad set -4* Seated: LAQ A: -9* Standing A: TKE -3*   (Blank rows = not tested)  LOWER EXTREMITY MMT:  MMT Right eval Left eval  Knee flexion  3-/5  Knee extension  3-/5   (Blank rows = not tested)  FUNCTIONAL TESTS:  18 inch chair transfer: minA using BUEs on armrests to  RW  GAIT: Distance walked: 80' Assistive device utilized: Environmental consultant - 2 wheeled Level of assistance: SBA Comments: excessive weight bearing BUEs and PWB on LLE   TODAY'S TREATMENT                                                                           DATE:  07/27/2022 BP sitting 119/86  HR 91 after first set on leg press  Therapeutic Exercise: SciFit seat 10 level 3 BLEs/ BUEs 4 min, then level 1 with BLEs only 4 min  Leg Press bil 118# 2x10; LLE only 62# 2x10 Hamstring stretch SLR w/strap PT  manual assist for ext 30 sec hold 3 reps during leg press rest Forward step ups & step down with LLE 15 reps on 6" step;  single UE support with PT demo & cues on form Squat lift BUEs 10# kettle bell 10 reps & 15# kettle bell 10 reps. Pt verbalized understanding as HEP. Seated LLE LAQ active, then AA with strap for >range, isometric hold and eccentric actively 15 reps. Pt verbalized understanding as HEP.   07/23/22 BP sitting 127/75  Therapeutic Exercise: SciFit seat 10 level 2 BLEs/ BUEs 8 min  Leg Press bil 100# 2x10; LLE only 50# 2x10 Forward step ups with LLE 2x10 on 6" step; light UE support with focus on TKE before bringing Rt foot up to step Seated LAQ 2x10; 5# on Lt with 5 sec hold  07/20/2022 Seated BP 142/83 HR 99  Therapeutic Exercise: SciFit seat 13 level 1 BLEs/ BUEs 6 min  Leg press BLEs 100# 10 reps;   LLE only 50# 10 reps Seated prop 5# left knee ext 1 min 2 sets Hamstring stretch seated LLE on 2nd chair with strap 20 sec hold 2 reps Gastroc stretch on incline board 30 sec hold 2 reps Heel raises on incline board with LBE support reaching RUE overhead 10 reps Seated LAQ 5# & active knee flexion with opposing motion contralateral 5 sec hold 15 reps PT updated HEP to include stretches including HO. Pt verbalized understanding.   Manual Therapy: Seated Flexion PROM with overpressure manual traction & tibial internal rotation Supine ext grade IV  mob.  Vasopneumatic: Left knee medium compression 34* 10 min with BLEs elevated   HOME EXERCISE PROGRAM: Access Code: [email or web address] URL: [email or web address] Date: 07/20/2022 Prepared by: Jamey Reas  Exercises - Ankle Alphabet in Elevation  - 2-4 x daily - 7 x weekly - 1 sets - 1 reps - Quad Setting and Stretching  - 2-4 x daily - 7 x weekly - 5-10 sets - 10 reps - prop 5-10 minutes & quad set5 seconds hold - Supine Leg Press  - 2-4 x daily - 7 x weekly - 5-10 sets - 10 reps - 5 seconds hold - Supine Heel Slide with Strap  - 2-3 x daily - 7 x weekly - 2-3 sets - 10 reps - 5 seconds hold - Supine Straight Leg Raises  - 2-3 x daily - 7 x weekly - 2-3 sets - 10 reps - 5 seconds hold - Seated Knee Flexion Extension AROM   - 2-4 x daily - 7 x weekly - 2-3 sets - 10 reps - 5 seconds hold - Seated straight leg lifts  - 2-3 x daily - 7 x weekly - 2-3 sets - 10 reps - 5 seconds hold - Seated Hamstring Stretch with Strap  - 2-4 x daily - 7 x weekly - 1 sets - 3 reps - 20-30 seconds hold - Seated Passive Knee Extension with Weight  - 1-3 x daily - 7 x weekly - 1 sets - 2-3 reps - 1-2 minutes hold - Seated Hamstring Stretch with Chair  - 1-3 x daily - 7 x weekly - 1 sets - 2-3 reps - 20-30 seconds hold - Gastroc Stretch on Step  - 1-3 x daily - 7 x weekly - 1 sets - 2-3 reps - 20-30 seconds hold - Supine Quadriceps Stretch with Strap on Table  - 1-3 x daily - 7 x weekly - 1 sets - 2-3 reps - 20-30 seconds hold  ASSESSMENT: CLINICAL IMPRESSION: PT  progressed activities to include squat lifts with weight to build functional strength for return to work.  Pt function range continues to improve with PT interventions.   OBJECTIVE IMPAIRMENTS: Abnormal gait, decreased activity tolerance, decreased balance, decreased endurance, decreased knowledge of condition, decreased knowledge of use of DME, decreased mobility, difficulty walking, decreased ROM, decreased strength, increased edema,  increased muscle spasms, impaired flexibility, obesity, and pain.   ACTIVITY LIMITATIONS: carrying, lifting, bending, sitting, standing, squatting, sleeping, stairs, transfers, bed mobility, and locomotion level  PARTICIPATION LIMITATIONS: meal prep, cleaning, community activity, occupation, and yard work  PERSONAL FACTORS: 3+ comorbidities: see PMH  are also affecting patient's functional outcome.   REHAB POTENTIAL: Good  CLINICAL DECISION MAKING: Stable/uncomplicated  EVALUATION COMPLEXITY: Low   GOALS: Goals reviewed with patient? Yes  SHORT TERM GOALS: (target date 08/07/2022)   1.  Patient will demonstrate independent use of home exercise program to maintain progress from in clinic treatments. Goal status: New  2. Patient reports 25% improvement in left knee pain.  Goal Status: INITIAL  3. Left knee PROM -3* ext to 100* flex  LONG TERM GOALS: (target dates for all long term goals are 10 weeks  09/18/2021 )   1. Patient will demonstrate/report pain at worst less than or equal to 2/10 to facilitate minimal limitation in daily activity secondary to pain symptoms.  Goal status: New   2. Patient will demonstrate independent use of home exercise program to facilitate ability to maintain/progress functional gains from skilled physical therapy services.  Goal status: New   3. Patient will demonstrate FOTO outcome > or = 70 % to indicate reduced disability due to condition.  Goal status: New   4.  Patient will demonstrate left LE MMT 5/5 throughout to faciltiate usual transfers, stairs, squatting at [facility] for daily life.   Goal status: New   5.  Patient will demonstrate ability to perform work task of lifting & pushing weighted cart.  Goal status: New   6.  patient ambulates >500', negotiates ramps, curbs & stairs single rail independent without assistive device.  Goal status: New   7.  Left knee AROM 0* ext standing and 110* flexion seated. Goal Status:  New   PLAN:  PT FREQUENCY: 2-3x/week  PT DURATION: 10 weeks  PLANNED INTERVENTIONS: Therapeutic exercises, Therapeutic activity, Neuro Muscular re-education, Balance training, Gait training, Patient/Family education, Joint mobilization, Stair training, DME instructions, Dry Needling, Electrical stimulation, Traction, Cryotherapy, vasopneumatic device, Moist heat, Taping, Ultrasound, Ionotophoresis '4mg'$ /ml Dexamethasone, and Manual therapy.  All included unless contraindicated  PLAN FOR NEXT SESSION: instruct in stairs, will need MD note before 12/14 appt; continue Manual therapy & exercise for range. Progress to standing functional exercises. Standing balance.     Jamey Reas, PT, DPT 07/27/2022, 8:51 AM

## 2022-07-28 NOTE — Telephone Encounter (Signed)
Faxed to [phone].  Received fax confirmation.  Copies made for patient, scanning, and myself.  Sent msg via mychart to see if patient wants his copy mailed, emailed, or picked up from office.

## 2022-07-28 NOTE — Telephone Encounter (Signed)
We received an FMLA form for patient from [employer].  This is the same form that Dr Erlinda Hong filled out for patient on 07/23/22.  I called and spoke to [employer] at ph#[phone].  They wanted to verify dates that we saw patient for his knee and his return to work date.  This form has been placed in your inbox for completion and signing.

## 2022-07-28 NOTE — Telephone Encounter (Signed)
Done and in IN box 

## 2022-07-29 NOTE — Telephone Encounter (Signed)
Patient's copy placed in outgoing mail per request. 

## 2022-07-30 ENCOUNTER — Encounter: Payer: Self-pay | Admitting: Physical Therapy

## 2022-07-30 ENCOUNTER — Ambulatory Visit (INDEPENDENT_AMBULATORY_CARE_PROVIDER_SITE_OTHER): Payer: 59 | Admitting: Physical Therapy

## 2022-07-30 DIAGNOSIS — M25662 Stiffness of left knee, not elsewhere classified: Secondary | ICD-10-CM | POA: Diagnosis not present

## 2022-07-30 DIAGNOSIS — M6281 Muscle weakness (generalized): Secondary | ICD-10-CM

## 2022-07-30 DIAGNOSIS — R2689 Other abnormalities of gait and mobility: Secondary | ICD-10-CM | POA: Diagnosis not present

## 2022-07-30 DIAGNOSIS — R600 Localized edema: Secondary | ICD-10-CM

## 2022-07-30 DIAGNOSIS — M25562 Pain in left knee: Secondary | ICD-10-CM

## 2022-07-30 NOTE — Therapy (Signed)
OUTPATIENT PHYSICAL THERAPY LOWER EXTREMITY TREATMENT & PROGRESS NOTE   Patient Name: Sean Lee MRN: [id number] DOB:11/09/1960, 61 y.o., male Today's Date: 07/30/2022 Progress Note Reporting Period 07/07/2022 to 07/30/2022  See note below for Objective Data and Assessment of Progress/Goals.      END OF SESSION:  PT End of Session - 07/30/22 0903     Visit Number 9    Number of Visits 22    Date for PT Re-Evaluation 09/18/22    Authorization Type [employer]    Authorization Time Period 20% co-insurance    Authorization - Visit Number 9    Authorization - Number of Visits 50    Progress Note Due on Visit 83    PT Start Time 0849    PT Stop Time 0935    PT Time Calculation (min) 46 min    Activity Tolerance Patient tolerated treatment well    Behavior During Therapy WFL for tasks assessed/performed                  Past Medical History:  Diagnosis Date   Contact dermatitis and other eczema due to plants (except food)    H/O: CVA (cardiovascular accident)    Carotid doppler neg (4/08)   Hematuria    Obesity, unspecified    Other and unspecified hyperlipidemia    Hx - no medication/diet controll and weight loss   Sacroiliitis, not elsewhere classified ([place])    Sleep apnea    uses CPAP but not every night   Unspecified essential hypertension    Varicose veins of left lower extremity    Past Surgical History:  Procedure Laterality Date   Carotid dopplers  11/16/2006   Negative   HERNIA REPAIR     umbilical   TOTAL KNEE ARTHROPLASTY Left 06/18/2022   TOTAL KNEE ARTHROPLASTY Left 06/18/2022   Procedure: LEFT TOTAL KNEE ARTHROPLASTY;  Surgeon: Leandrew Koyanagi, MD;  Location: [facility];  Service: Orthopedics;  Laterality: Left;   Patient Active Problem List   Diagnosis Date Noted   Status post total left knee replacement 06/18/2022   Primary osteoarthritis of left knee 01/01/2022   Primary osteoarthritis of right knee 01/01/2022   Left knee pain 28/36/6294    Umbilical hernia 76/54/6503   ED (erectile dysfunction) 12/31/2020   Insomnia 04/02/2020   Right ankle swelling 07/25/2019   Gout 11/03/2018   Chronic pain of right ankle 08/31/2018   Routine general medical examination at a health care facility 11/01/2017   Prostate cancer screening 06/28/2017   Alopecia 11/04/2016   Back pain 05/06/2016   Sleep disorder 08/28/2015   Colon cancer screening 08/28/2015   Prediabetes 01/02/2013   Varicosities of leg 03/21/2012   Rectus diastasis of lower abdomen 03/21/2012   Obstructive sleep apnea 10/15/2009   Class 2 severe obesity due to excess calories with serious comorbidity and body mass index (BMI) of 38.0 to 38.9 in adult [facility]) 09/02/2009   Hyperlipidemia 02/02/2007   HEMATURIA, MICROSCOPIC 02/02/2007   Essential hypertension 02/01/2007   CEREBROVASCULAR ACCIDENT, HX OF 02/01/2007    PCP: Abner Greenspan, MD  REFERRING PROVIDER: Leandrew Koyanagi, MD  REFERRING DIAG: [phone] (ICD-10-CM) - Status post total left knee replacement   THERAPY DIAG:  Stiffness of left knee, not elsewhere classified  Acute pain of left knee  Other abnormalities of gait and mobility  Muscle weakness (generalized)  Localized edema  Rationale for Evaluation and Treatment: Rehabilitation  ONSET DATE: 06/18/2022 Left TKA  SUBJECTIVE:   SUBJECTIVE  STATEMENT: He tried some of step up exercise on stairs which went well.   PERTINENT HISTORY: OA, back pain, obesity, HTN, CVA, contact dermatitis  PAIN:  Are you having pain? Yes: NPRS scale: 3/10 today, and since last PT lowest 2/10 & highest 4/10 Pain location: L knee in general  Pain description: tight "like something is pulling on it", still numb too  Aggravating factors: bending, turning over in sleep Relieving factors: meds, ice, elevation, pillows between legs during sleep, CPM   PRECAUTIONS: None  WEIGHT BEARING RESTRICTIONS: No  FALLS:  Has patient fallen in last 6 months? No  LIVING  ENVIRONMENT: Lives with: lives with their spouse and adult niece Lives in: House Stairs: Yes: Internal: 14 steps; on left going up and External: 4 steps; on right going up  bathroom & bedroom main level, his fish aquarium is upstairs. 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  PLOF: Independent, Independent with household mobility without device, and Independent with community mobility without device  PATIENT GOALS:  return to work, household activities.  NEXT MD VISIT: 07/31/2022  OBJECTIVE:   OBJECTIVE:   DIAGNOSTIC FINDINGS: 06/18/2022 - Postsurgical changes from right knee arthroplasty. Evidence of periprosthetic fracture. No loosening. Overlying external material limits the ability to assess for soft tissue abnormality. Within this limitation no focal abnormality is visualized.  PATIENT SURVEYS:  EVAL: FOTO intake: 61%   predicted:  70%  EDEMA:  RLE: above knee 47.5cm  around knee 45cm  below knee 42cm LLE: above knee 56.3cm  around knee 55cm below knee 44cm  POSTURE: rounded shoulders, forward head, flexed trunk , and weight shift right  PALPATION: Tenderness along joint line & scar. Steri-strips in place. Small scabs still present on incision.   LOWER EXTREMITY ROM:   ROM Left eval Left  07/15/22 Left 07/20/22 Left 07/27/22 Left 07/30/22  Knee flexion Supine  P: 94* Seated P:111* A: 102* Seated P:111* A: 103* Seated P:113* A: 103* Seated P:120* A: 107*  Knee extension Seated: LAQ A: -18* Supine  P: -6* A: quad set -9* Seated: LAQ A: -9* Supine  P: -3* A: quad set -4* Seated: LAQ A: -9* Supine  P: -3* A: quad set -4* Seated: LAQ A: -9* Standing A: TKE -3* Seated A: -4* Standing A: 0*   (Blank rows = not tested)  LOWER EXTREMITY MMT:  MMT Left eval Right 1214/23 Left 07/30/22  Knee flexion  3-/5 HH dynameter 55.2# & 53.8# HH dynameter 30.2# & 27.4# LLE:RLE 52.8%  Knee extension 3-/5 HH dynameter 69.3# & 68.7# HH dynameter 44.1# & 43.1# LLE:RLE 63.2%   (Blank rows = not tested)  FUNCTIONAL TESTS:  18 inch chair transfer: minA using BUEs on armrests to RW  GAIT: Distance walked: 61' Assistive device utilized: Environmental consultant - 2 wheeled Level of assistance: SBA Comments: excessive weight bearing BUEs and PWB on LLE   TODAY'S TREATMENT                                                                           DATE:  07/30/2022: Therapeutic Exercise: Precor  seat 10 level 3 BLEs/ BUEs 4 min, then level 1 with BLEs only 4 min  Leg Press bil 125# 15reps; LLE only 62# 15 reps Stairs 11 step flight: initial flight 2 rails 3 steps step to using LLE to descend then alternating; 2nd flight alternating descend single rail switching sides half way. Ascend alterating pattern 1st flight & 2nd flight descend single rail switching sides half way PT educated pt on recommendation of ongoing fitness for minimum of one year. PT recommending starting 1x/wk in addition to PT to enable problem solving and creating behavior.  Recommend leg press, knee ext & knee flex machines but putting other machines like UE between to avoid fatigue and 48hrs bw strength exercises and bike building up to 20-30 minutes. Pt verbalized understanding & is going to check into options for [employer] vs [employer] vs [employer].  See objective for ROM & MMT.   07/27/2022 BP sitting 119/86  HR 91 after first set on leg press  Therapeutic Exercise: SciFit seat 10 level 3 BLEs/ BUEs 4 min, then level 1 with BLEs only 4 min  Leg Press bil 118# 2x10; LLE only 62# 2x10 Hamstring stretch SLR w/strap PT manual assist for ext 30 sec hold 3 reps during leg press rest Forward step ups & step down with LLE 15 reps on 6" step;  single UE support with PT demo & cues on form Squat lift BUEs 10# kettle bell 10 reps & 15# kettle bell 10 reps.  Pt verbalized understanding as HEP. Seated LLE LAQ active, then AA with strap for >range, isometric hold and eccentric actively 15 reps. Pt verbalized understanding as HEP.   07/23/22 BP sitting 127/75  Therapeutic Exercise: SciFit seat 10 level 2 BLEs/ BUEs 8 min  Leg Press bil 100# 2x10; LLE only 50# 2x10 Forward step ups with LLE 2x10 on 6" step; light UE support with focus on TKE before bringing Rt foot up to step Seated LAQ 2x10; 5# on Lt with 5 sec hold    HOME EXERCISE PROGRAM: Access Code: [email or web address] URL: [email or web address] Date: 07/20/2022 Prepared by: Jamey Reas  Exercises - Ankle Alphabet in Elevation  - 2-4 x daily - 7 x weekly - 1 sets - 1 reps - Quad Setting and Stretching  - 2-4 x daily - 7 x weekly - 5-10 sets - 10 reps - prop 5-10 minutes & quad set5 seconds hold - Supine Leg Press  - 2-4 x daily - 7 x weekly - 5-10 sets - 10 reps - 5 seconds hold - Supine Heel Slide with Strap  - 2-3 x daily - 7 x weekly - 2-3 sets - 10 reps - 5 seconds hold - Supine Straight Leg Raises  - 2-3 x daily - 7 x weekly - 2-3 sets - 10 reps - 5 seconds hold - Seated Knee Flexion Extension AROM   - 2-4 x daily - 7 x weekly - 2-3 sets - 10 reps - 5 seconds hold - Seated straight leg lifts  - 2-3 x daily - 7 x weekly - 2-3 sets - 10 reps - 5 seconds hold - Seated Hamstring Stretch with Strap  - 2-4 x daily - 7 x weekly - 1 sets - 3 reps - 20-30 seconds hold - Seated Passive Knee Extension with Weight  - 1-3 x daily - 7 x weekly - 1 sets - 2-3 reps - 1-2 minutes hold - Seated Hamstring Stretch with Chair  - 1-3 x daily - 7 x weekly -  1 sets - 2-3 reps - 20-30 seconds hold - Gastroc Stretch on Step  - 1-3 x daily - 7 x weekly - 1 sets - 2-3 reps - 20-30 seconds hold - Supine Quadriceps Stretch with Strap on Table  - 1-3 x daily - 7 x weekly - 1 sets - 2-3 reps - 20-30 seconds hold  ASSESSMENT: CLINICAL IMPRESSION: Patient's range, pain & edema have improved significantly.   He continues to have limitations in strength & functional activities including balance that would benefit from additional skilled PT.  Pt reports that he is pleased with progress thus far but needs more PT.   OBJECTIVE IMPAIRMENTS: Abnormal gait, decreased activity tolerance, decreased balance, decreased endurance, decreased knowledge of condition, decreased knowledge of use of DME, decreased mobility, difficulty walking, decreased ROM, decreased strength, increased edema, increased muscle spasms, impaired flexibility, obesity, and pain.   ACTIVITY LIMITATIONS: carrying, lifting, bending, sitting, standing, squatting, sleeping, stairs, transfers, bed mobility, and locomotion level  PARTICIPATION LIMITATIONS: meal prep, cleaning, community activity, occupation, and yard work  PERSONAL FACTORS: 3+ comorbidities: see PMH  are also affecting patient's functional outcome.   REHAB POTENTIAL: Good  CLINICAL DECISION MAKING: Stable/uncomplicated  EVALUATION COMPLEXITY: Low   GOALS: Goals reviewed with patient? Yes  SHORT TERM GOALS: (target date 08/07/2022)   1.  Patient will demonstrate independent use of home exercise program to maintain progress from in clinic treatments. Goal status: New  2. Patient reports 25% improvement in left knee pain.  Goal Status: INITIAL  3. Left knee PROM -3* ext to 100* flex  LONG TERM GOALS: (target dates for all long term goals are 10 weeks  09/18/2021 )   1. Patient will demonstrate/report pain at worst less than or equal to 2/10 to facilitate minimal limitation in daily activity secondary to pain symptoms.  Goal status: New   2. Patient will demonstrate independent use of home exercise program to facilitate ability to maintain/progress functional gains from skilled physical therapy services.  Goal status: New   3. Patient will demonstrate FOTO outcome > or = 70 % to indicate reduced disability due to condition.  Goal status: New   4.  Patient will  demonstrate left LE MMT 5/5 throughout to faciltiate usual transfers, stairs, squatting at [facility] for daily life.   Goal status: New   5.  Patient will demonstrate ability to perform work task of lifting & pushing weighted cart.  Goal status: New   6.  patient ambulates >500', negotiates ramps, curbs & stairs single rail independent without assistive device.  Goal status: New   7.  Left knee AROM 0* ext standing and 110* flexion seated. Goal Status: New   PLAN:  PT FREQUENCY: 2-3x/week  PT DURATION: 10 weeks  PLANNED INTERVENTIONS: Therapeutic exercises, Therapeutic activity, Neuro Muscular re-education, Balance training, Gait training, Patient/Family education, Joint mobilization, Stair training, DME instructions, Dry Needling, Electrical stimulation, Traction, Cryotherapy, vasopneumatic device, Moist heat, Taping, Ultrasound, Ionotophoresis '4mg'$ /ml Dexamethasone, and Manual therapy.  All included unless contraindicated  PLAN FOR NEXT SESSION:  check stairs, check MD note 12/15 appt; continue Manual therapy prn. Strengthening exercises & Progress to standing functional exercises. Standing balance.     Jamey Reas, PT, DPT 07/30/2022, 12:56 PM

## 2022-07-31 ENCOUNTER — Ambulatory Visit (INDEPENDENT_AMBULATORY_CARE_PROVIDER_SITE_OTHER): Payer: 59 | Admitting: Orthopaedic Surgery

## 2022-07-31 ENCOUNTER — Encounter: Payer: Self-pay | Admitting: Orthopaedic Surgery

## 2022-07-31 ENCOUNTER — Ambulatory Visit (INDEPENDENT_AMBULATORY_CARE_PROVIDER_SITE_OTHER): Payer: 59

## 2022-07-31 DIAGNOSIS — Z96652 Presence of left artificial knee joint: Secondary | ICD-10-CM

## 2022-07-31 MED ORDER — HYDROCODONE-ACETAMINOPHEN 5-325 MG PO TABS: 1.0000 | ORAL_TABLET | Freq: Three times a day (TID) | ORAL | 0 refills | Status: DC | PRN

## 2022-07-31 NOTE — Progress Notes (Unsigned)
Post-Op Visit Note   Patient: Sean Lee           Date of Birth: 01/27/1961           MRN: [id number] Visit Date: 07/31/2022 PCP: Abner Greenspan, MD   Assessment & Plan:  Chief Complaint:  Chief Complaint  Patient presents with   Left Knee - Follow-up    Left total knee arthroplasty 06/18/2022   Visit Diagnoses:  1. Status post total left knee replacement     Plan: Patient is a pleasant 61 year old gentleman who comes in today 6 weeks status post left total knee replacement 11-23.  He has been doing relatively well.  He has been in physical therapy making good progress.  He has been compliant taking a baby aspirin twice daily for DVT prophylaxis.  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  Examination of the left knee reveals a fully healed surgical scar without complication.  Range of motion 0 to 115 degrees.  He is stable to valgus varus stress.  He is neurovascular intact distally.  At this point, he will discontinue the baby aspirin twice daily from our standpoint.  Will continue with physical therapy as well as a home exercise program.  I refilled his Norco.  Dental prophylaxis reinforced.  Follow-up in 6 weeks for recheck.  Call with concerns or questions in the meantime.  Follow-Up Instructions: Return in about 6 weeks (around 09/11/2022).   Orders:  Orders Placed This Encounter  Procedures   XR Knee 1-2 Views Left   No orders of the defined types were placed in this encounter.   Imaging: XR Knee 1-2 Views Left  Result Date: 07/31/2022 Well-seated prosthesis without complication   PMFS History: Patient Active Problem List   Diagnosis Date Noted   Status post total left knee replacement 06/18/2022   Primary osteoarthritis of left knee 01/01/2022   Primary osteoarthritis of right knee 01/01/2022   Left knee pain 78/29/5621   Umbilical hernia 30/86/5784   ED (erectile dysfunction) 12/31/2020   Insomnia 04/02/2020   Right ankle swelling  07/25/2019   Gout 11/03/2018   Chronic pain of right ankle 08/31/2018   Routine general medical examination at a health care facility 11/01/2017   Prostate cancer screening 06/28/2017   Alopecia 11/04/2016   Back pain 05/06/2016   Sleep disorder 08/28/2015   Colon cancer screening 08/28/2015   Prediabetes 01/02/2013   Varicosities of leg 03/21/2012   Rectus diastasis of lower abdomen 03/21/2012   Obstructive sleep apnea 10/15/2009   Class 2 severe obesity due to excess calories with serious comorbidity and body mass index (BMI) of 38.0 to 38.9 in adult [facility]) 09/02/2009   Hyperlipidemia 02/02/2007   HEMATURIA, MICROSCOPIC 02/02/2007   Essential hypertension 02/01/2007   CEREBROVASCULAR ACCIDENT, HX OF 02/01/2007   Past Medical History:  Diagnosis Date   Contact dermatitis and other eczema due to plants (except food)    H/O: CVA (cardiovascular accident)    Carotid doppler neg (4/08)   Hematuria    Obesity, unspecified    Other and unspecified hyperlipidemia    Hx - no medication/diet controll and weight loss   Sacroiliitis, not elsewhere classified ([place])    Sleep apnea    uses CPAP but not every night   Unspecified essential hypertension    Varicose veins of left lower extremity     Family History  Problem Relation Age of Onset   Hypertension Mother    Diabetes  Mother    Hyperlipidemia Unknown        in family    Cancer Neg Hx    Esophageal cancer Neg Hx    Rectal cancer Neg Hx    Stomach cancer Neg Hx    Colon cancer Neg Hx     Past Surgical History:  Procedure Laterality Date   Carotid dopplers  11/16/2006   Negative   HERNIA REPAIR     umbilical   TOTAL KNEE ARTHROPLASTY Left 06/18/2022   TOTAL KNEE ARTHROPLASTY Left 06/18/2022   Procedure: LEFT TOTAL KNEE ARTHROPLASTY;  Surgeon: Leandrew Koyanagi, MD;  Location: [facility];  Service: Orthopedics;  Laterality: Left;   Social History   Occupational History   Occupation: Truck Education administrator: [employer]  Tobacco  Use   Smoking status: Never   Smokeless tobacco: Never  Vaping Use   Vaping Use: Never used  Substance and Sexual Activity   Alcohol use: Yes    Alcohol/week: 4.0 standard drinks of alcohol    Types: 4 Cans of beer per week    Comment: weekends   Drug use: No   Sexual activity: Not on file

## 2022-08-03 ENCOUNTER — Ambulatory Visit (INDEPENDENT_AMBULATORY_CARE_PROVIDER_SITE_OTHER): Payer: 59 | Admitting: Physical Therapy

## 2022-08-03 ENCOUNTER — Encounter: Payer: Self-pay | Admitting: Physical Therapy

## 2022-08-03 DIAGNOSIS — M25662 Stiffness of left knee, not elsewhere classified: Secondary | ICD-10-CM | POA: Diagnosis not present

## 2022-08-03 DIAGNOSIS — M6281 Muscle weakness (generalized): Secondary | ICD-10-CM

## 2022-08-03 DIAGNOSIS — R2689 Other abnormalities of gait and mobility: Secondary | ICD-10-CM

## 2022-08-03 DIAGNOSIS — M25562 Pain in left knee: Secondary | ICD-10-CM

## 2022-08-03 DIAGNOSIS — R600 Localized edema: Secondary | ICD-10-CM

## 2022-08-03 DIAGNOSIS — R2681 Unsteadiness on feet: Secondary | ICD-10-CM

## 2022-08-03 NOTE — Therapy (Signed)
OUTPATIENT PHYSICAL THERAPY LOWER EXTREMITY TREATMENT   Patient Name: Sean Lee MRN: [id number] DOB:[birth year], 61 y.o., male Today's Date: 08/03/2022     END OF SESSION:  PT End of Session - 08/03/22 0758     Visit Number 10    Number of Visits 22    Date for PT Re-Evaluation 09/18/22    Authorization Type [employer]    Authorization Time Period 20% co-insurance    Authorization - Number of Visits 50    Progress Note Due on Visit 83    PT Start Time 0800    PT Stop Time 0844    PT Time Calculation (min) 44 min    Activity Tolerance Patient tolerated treatment well    Behavior During Therapy WFL for tasks assessed/performed                   Past Medical History:  Diagnosis Date   Contact dermatitis and other eczema due to plants (except food)    H/O: CVA (cardiovascular accident)    Carotid doppler neg (4/08)   Hematuria    Obesity, unspecified    Other and unspecified hyperlipidemia    Hx - no medication/diet controll and weight loss   Sacroiliitis, not elsewhere classified ([place])    Sleep apnea    uses CPAP but not every night   Unspecified essential hypertension    Varicose veins of left lower extremity    Past Surgical History:  Procedure Laterality Date   Carotid dopplers  11/16/2006   Negative   HERNIA REPAIR     umbilical   TOTAL KNEE ARTHROPLASTY Left 06/18/2022   TOTAL KNEE ARTHROPLASTY Left 06/18/2022   Procedure: LEFT TOTAL KNEE ARTHROPLASTY;  Surgeon: Leandrew Koyanagi, MD;  Location: [facility];  Service: Orthopedics;  Laterality: Left;   Patient Active Problem List   Diagnosis Date Noted   Status post total left knee replacement 06/18/2022   Primary osteoarthritis of left knee 01/01/2022   Primary osteoarthritis of right knee 01/01/2022   Left knee pain 61/95/0932   Umbilical hernia 67/07/4579   ED (erectile dysfunction) 12/31/2020   Insomnia 04/02/2020   Right ankle swelling 07/25/2019   Gout 11/03/2018   Chronic pain of right ankle  08/31/2018   Routine general medical examination at a health care facility 11/01/2017   Prostate cancer screening 06/28/2017   Alopecia 11/04/2016   Back pain 05/06/2016   Sleep disorder 08/28/2015   Colon cancer screening 08/28/2015   Prediabetes 01/02/2013   Varicosities of leg 03/21/2012   Rectus diastasis of lower abdomen 03/21/2012   Obstructive sleep apnea 10/15/2009   Class 2 severe obesity due to excess calories with serious comorbidity and body mass index (BMI) of 38.0 to 38.9 in adult [facility]) 09/02/2009   Hyperlipidemia 02/02/2007   HEMATURIA, MICROSCOPIC 02/02/2007   Essential hypertension 02/01/2007   CEREBROVASCULAR ACCIDENT, HX OF 02/01/2007    PCP: Abner Greenspan, MD  REFERRING PROVIDER: Leandrew Koyanagi, MD  REFERRING DIAG: [phone] (ICD-10-CM) - Status post total left knee replacement   THERAPY DIAG:  Stiffness of left knee, not elsewhere classified  Acute pain of left knee  Other abnormalities of gait and mobility  Muscle weakness (generalized)  Localized edema  Unsteadiness on feet  Rationale for Evaluation and Treatment: Rehabilitation  ONSET DATE: 06/18/2022 Left TKA  SUBJECTIVE:   SUBJECTIVE STATEMENT: He saw Dr. Erlinda Hong who is pleased with progress thus far but still needs PT. His incision was bleeding in one small area this  morning in shower. He plans to join [employer]. He rode bike on trail near his home.   PERTINENT HISTORY: OA, back pain, obesity, HTN, CVA, contact dermatitis  PAIN:  Are you having pain? Yes: NPRS scale: 0/10 today, and since last PT lowest 0/10 & highest 3/10 Pain location: L knee in general  Pain description: tight "like something is pulling on it", still numb too  Aggravating factors: bending, turning over in sleep Relieving factors: meds, ice, elevation, pillows between legs during sleep, CPM   PRECAUTIONS: None  WEIGHT BEARING RESTRICTIONS: No  FALLS:  Has patient fallen in last 6 months? No  LIVING ENVIRONMENT: Lives with:  lives with their spouse and adult niece Lives in: House Stairs: Yes: Internal: 14 steps; on left going up and External: 4 steps; on right going up  bathroom & bedroom main level, his fish aquarium is upstairs. 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  PATIENT GOALS:  return to work, household activities.  NEXT MD VISIT: 07/31/2022  OBJECTIVE:   OBJECTIVE:   DIAGNOSTIC FINDINGS: 06/18/2022 - Postsurgical changes from right knee arthroplasty. Evidence of periprosthetic fracture. No loosening. Overlying external material limits the ability to assess for soft tissue abnormality. Within this limitation no focal abnormality is visualized.  PATIENT SURVEYS:  EVAL: FOTO intake: 61%   predicted:  70%  EDEMA:  RLE: above knee 47.5cm  around knee 45cm  below knee 42cm LLE: above knee 56.3cm  around knee 55cm below knee 44cm  POSTURE: rounded shoulders, forward head, flexed trunk , and weight shift right  PALPATION: Tenderness along joint line & scar. Steri-strips in place. Small scabs still present on incision.   LOWER EXTREMITY ROM:   ROM Left eval Left  07/15/22 Left 07/20/22 Left 07/27/22 Left 07/30/22  Knee flexion Supine  P: 94* Seated P:111* A: 102* Seated P:111* A: 103* Seated P:113* A: 103* Seated P:120* A: 107*  Knee extension Seated: LAQ A: -18* Supine  P: -6* A: quad set -9* Seated: LAQ A: -9* Supine  P: -3* A: quad set -4* Seated: LAQ A: -9* Supine  P: -3* A: quad set -4* Seated: LAQ A: -9* Standing A: TKE -3* Seated A: -4* Standing A: 0*   (Blank rows = not tested)  LOWER EXTREMITY MMT:  MMT Left eval Right 1214/23 Left 07/30/22  Knee flexion 3-/5 HH dynameter 55.2# & 53.8# HH dynameter 30.2# & 27.4# LLE:RLE 52.8%  Knee extension 3-/5 HH dynameter 69.3# & 68.7# HH dynameter 44.1# &  43.1# LLE:RLE 63.2%   (Blank rows = not tested)  FUNCTIONAL TESTS:  18 inch chair transfer: minA using BUEs on armrests to RW  GAIT: Distance walked: 28' Assistive device utilized: Environmental consultant - 2 wheeled Level of assistance: SBA Comments: excessive weight bearing BUEs and PWB on LLE   TODAY'S TREATMENT                                                                           DATE:  08/03/2022: Therapeutic Exercise: Precor seat 10 level 3 with BLEs full revolution 6 min  Stairs 11  step flight alternating with Lt rail 1 flight. Verbal cues on technique Squats with PT demo & verbal cues on technique. 5 reps no wt & 10 reps 15# kettle bell.  Pt amb carrying 15# kettle bell 75' in ea UE 2 sets.  Knee ext machine BLEs 25# 15 reps 1 set, single leg 10# RLE 3 sets LLE 2 sets; PT instructed in machine set up & pt verbalized understanding.  Knee flexion machine BLEs 35# 15 reps 1 set, single leg 15# RLE 3 sets LLE 2 sets; PT instructed in machine set up & pt verbalized understanding.   Neuromuscular Re-ed: Slider to 3 cones (ant-lat, lat & post-lat) with stance knee flexion on out & ext on in motion. UE support on sink as safety. 5 reps ea LE. Tandem gait on foam beam 3 laps. Side stepping on foam beam 3 ea direction.  Incision has small opening with light bleeding with no signs of infection. Small circular area in middle that could be internal suture working way out.   07/30/2022: Therapeutic Exercise: Precor seat 10 level 3 BLEs/ BUEs 4 min, then level 1 with BLEs only 4 min  Leg Press bil 125# 15reps; LLE only 62# 15 reps Stairs 11 step flight: initial flight 2 rails 3 steps step to using LLE to descend then alternating; 2nd flight alternating descend single rail switching sides half way. Ascend alterating pattern 1st flight & 2nd flight descend single rail switching sides half way PT educated pt on recommendation of ongoing fitness for minimum of one year. PT recommending starting 1x/wk in  addition to PT to enable problem solving and creating behavior.  Recommend leg press, knee ext & knee flex machines but putting other machines like UE between to avoid fatigue and 48hrs bw strength exercises and bike building up to 20-30 minutes. Pt verbalized understanding & is going to check into options for [employer] vs [employer] vs [employer].  See objective for ROM & MMT.   07/27/2022 BP sitting 119/86  HR 91 after first set on leg press  Therapeutic Exercise: SciFit seat 10 level 3 BLEs/ BUEs 4 min, then level 1 with BLEs only 4 min  Leg Press bil 118# 2x10; LLE only 62# 2x10 Hamstring stretch SLR w/strap PT manual assist for ext 30 sec hold 3 reps during leg press rest Forward step ups & step down with LLE 15 reps on 6" step;  single UE support with PT demo & cues on form Squat lift BUEs 10# kettle bell 10 reps & 15# kettle bell 10 reps. Pt verbalized understanding as HEP. Seated LLE LAQ active, then AA with strap for >range, isometric hold and eccentric actively 15 reps. Pt verbalized understanding as HEP.     HOME EXERCISE PROGRAM: Access Code: [email or web address] URL: [email or web address] Date: 07/20/2022 Prepared by: Jamey Reas  Exercises - Ankle Alphabet in Elevation  - 2-4 x daily - 7 x weekly - 1 sets - 1 reps - Quad Setting and Stretching  - 2-4 x daily - 7 x weekly - 5-10 sets - 10 reps - prop 5-10 minutes & quad set5 seconds hold - Supine Leg Press  - 2-4 x daily - 7 x weekly - 5-10 sets - 10 reps - 5 seconds hold - Supine Heel Slide with Strap  - 2-3 x daily - 7 x weekly - 2-3 sets - 10 reps - 5 seconds hold - Supine Straight Leg Raises  - 2-3 x daily - 7 x weekly - 2-3 sets -  10 reps - 5 seconds hold - Seated Knee Flexion Extension AROM   - 2-4 x daily - 7 x weekly - 2-3 sets - 10 reps - 5 seconds hold - Seated straight leg lifts  - 2-3 x daily - 7 x weekly - 2-3 sets - 10 reps - 5 seconds hold - Seated Hamstring Stretch with Strap  - 2-4 x daily - 7 x weekly - 1  sets - 3 reps - 20-30 seconds hold - Seated Passive Knee Extension with Weight  - 1-3 x daily - 7 x weekly - 1 sets - 2-3 reps - 1-2 minutes hold - Seated Hamstring Stretch with Chair  - 1-3 x daily - 7 x weekly - 1 sets - 2-3 reps - 20-30 seconds hold - Gastroc Stretch on Step  - 1-3 x daily - 7 x weekly - 1 sets - 2-3 reps - 20-30 seconds hold - Supine Quadriceps Stretch with Strap on Table  - 1-3 x daily - 7 x weekly - 1 sets - 2-3 reps - 20-30 seconds hold  ASSESSMENT: CLINICAL IMPRESSION: PT session working on strength thru full range and function.  He improved balance activities introduced today with instruction.  Pt reports his leg was fatigued but was "good."   OBJECTIVE IMPAIRMENTS: Abnormal gait, decreased activity tolerance, decreased balance, decreased endurance, decreased knowledge of condition, decreased knowledge of use of DME, decreased mobility, difficulty walking, decreased ROM, decreased strength, increased edema, increased muscle spasms, impaired flexibility, obesity, and pain.   ACTIVITY LIMITATIONS: carrying, lifting, bending, sitting, standing, squatting, sleeping, stairs, transfers, bed mobility, and locomotion level  PARTICIPATION LIMITATIONS: meal prep, cleaning, community activity, occupation, and yard work  PERSONAL FACTORS: 3+ comorbidities: see PMH  are also affecting patient's functional outcome.   REHAB POTENTIAL: Good  CLINICAL DECISION MAKING: Stable/uncomplicated  EVALUATION COMPLEXITY: Low   GOALS: Goals reviewed with patient? Yes  SHORT TERM GOALS: (target date 08/07/2022)   1.  Patient will demonstrate independent use of home exercise program to maintain progress from in clinic treatments. Goal status: New  2. Patient reports 25% improvement in left knee pain.  Goal Status: INITIAL  3. Left knee PROM -3* ext to 100* flex  LONG TERM GOALS: (target dates for all long term goals are 10 weeks  09/18/2021 )   1. Patient will demonstrate/report  pain at worst less than or equal to 2/10 to facilitate minimal limitation in daily activity secondary to pain symptoms.  Goal status: New   2. Patient will demonstrate independent use of home exercise program to facilitate ability to maintain/progress functional gains from skilled physical therapy services.  Goal status: New   3. Patient will demonstrate FOTO outcome > or = 70 % to indicate reduced disability due to condition.  Goal status: New   4.  Patient will demonstrate left LE MMT 5/5 throughout to faciltiate usual transfers, stairs, squatting at [facility] for daily life.   Goal status: New   5.  Patient will demonstrate ability to perform work task of lifting & pushing weighted cart.  Goal status: New   6.  patient ambulates >500', negotiates ramps, curbs & stairs single rail independent without assistive device.  Goal status: New   7.  Left knee AROM 0* ext standing and 110* flexion seated. Goal Status: New   PLAN:  PT FREQUENCY: 2-3x/week  PT DURATION: 10 weeks  PLANNED INTERVENTIONS: Therapeutic exercises, Therapeutic activity, Neuro Muscular re-education, Balance training, Gait training, Patient/Family education, Joint mobilization, Stair training, DME  instructions, Dry Needling, Electrical stimulation, Traction, Cryotherapy, vasopneumatic device, Moist heat, Taping, Ultrasound, Ionotophoresis '4mg'$ /ml Dexamethasone, and Manual therapy.  All included unless contraindicated  PLAN FOR NEXT SESSION:  check ROM, continue Manual therapy prn. Strengthening exercises & Progress to standing functional exercises. Standing balance.     Jamey Reas, PT, DPT 08/03/2022, 12:21 PM

## 2022-08-05 ENCOUNTER — Ambulatory Visit (INDEPENDENT_AMBULATORY_CARE_PROVIDER_SITE_OTHER): Payer: 59 | Admitting: Physical Therapy

## 2022-08-05 ENCOUNTER — Encounter: Payer: Self-pay | Admitting: Physical Therapy

## 2022-08-05 DIAGNOSIS — M25662 Stiffness of left knee, not elsewhere classified: Secondary | ICD-10-CM | POA: Diagnosis not present

## 2022-08-05 DIAGNOSIS — M6281 Muscle weakness (generalized): Secondary | ICD-10-CM | POA: Diagnosis not present

## 2022-08-05 DIAGNOSIS — M25562 Pain in left knee: Secondary | ICD-10-CM | POA: Diagnosis not present

## 2022-08-05 DIAGNOSIS — R2681 Unsteadiness on feet: Secondary | ICD-10-CM

## 2022-08-05 DIAGNOSIS — R2689 Other abnormalities of gait and mobility: Secondary | ICD-10-CM

## 2022-08-05 DIAGNOSIS — R600 Localized edema: Secondary | ICD-10-CM

## 2022-08-05 NOTE — Therapy (Signed)
OUTPATIENT PHYSICAL THERAPY LOWER EXTREMITY TREATMENT   Patient Name: Sean Lee MRN: [id number] DOB:Oct 17, 1960, 61 y.o., male Today's Date: 08/05/2022     END OF SESSION:  PT End of Session - 08/05/22 1016     Visit Number 11    Number of Visits 22    Date for PT Re-Evaluation 09/18/22    Authorization Type [employer]    Authorization Time Period 20% co-insurance    Authorization - Number of Visits 50    Progress Note Due on Visit 76    PT Start Time 1015    PT Stop Time 1057    PT Time Calculation (min) 42 min    Activity Tolerance Patient tolerated treatment well    Behavior During Therapy WFL for tasks assessed/performed                   Past Medical History:  Diagnosis Date   Contact dermatitis and other eczema due to plants (except food)    H/O: CVA (cardiovascular accident)    Carotid doppler neg (4/08)   Hematuria    Obesity, unspecified    Other and unspecified hyperlipidemia    Hx - no medication/diet controll and weight loss   Sacroiliitis, not elsewhere classified ([place])    Sleep apnea    uses CPAP but not every night   Unspecified essential hypertension    Varicose veins of left lower extremity    Past Surgical History:  Procedure Laterality Date   Carotid dopplers  11/16/2006   Negative   HERNIA REPAIR     umbilical   TOTAL KNEE ARTHROPLASTY Left 06/18/2022   TOTAL KNEE ARTHROPLASTY Left 06/18/2022   Procedure: LEFT TOTAL KNEE ARTHROPLASTY;  Surgeon: Leandrew Koyanagi, MD;  Location: [place];  Service: Orthopedics;  Laterality: Left;   Patient Active Problem List   Diagnosis Date Noted   Status post total left knee replacement 06/18/2022   Primary osteoarthritis of left knee 01/01/2022   Primary osteoarthritis of right knee 01/01/2022   Left knee pain 40/05/2724   Umbilical hernia 36/64/4034   ED (erectile dysfunction) 12/31/2020   Insomnia 04/02/2020   Right ankle swelling 07/25/2019   Gout 11/03/2018   Chronic pain of right ankle  08/31/2018   Routine general medical examination at a health care facility 11/01/2017   Prostate cancer screening 06/28/2017   Alopecia 11/04/2016   Back pain 05/06/2016   Sleep disorder 08/28/2015   Colon cancer screening 08/28/2015   Prediabetes 01/02/2013   Varicosities of leg 03/21/2012   Rectus diastasis of lower abdomen 03/21/2012   Obstructive sleep apnea 10/15/2009   Class 2 severe obesity due to excess calories with serious comorbidity and body mass index (BMI) of 38.0 to 38.9 in adult [facility]) 09/02/2009   Hyperlipidemia 02/02/2007   HEMATURIA, MICROSCOPIC 02/02/2007   Essential hypertension 02/01/2007   CEREBROVASCULAR ACCIDENT, HX OF 02/01/2007    PCP: Abner Greenspan, MD  REFERRING PROVIDER: Leandrew Koyanagi, MD  REFERRING DIAG: [phone] (ICD-10-CM) - Status post total left knee replacement   THERAPY DIAG:  Stiffness of left knee, not elsewhere classified  Acute pain of left knee  Other abnormalities of gait and mobility  Muscle weakness (generalized)  Unsteadiness on feet  Localized edema  Rationale for Evaluation and Treatment: Rehabilitation  ONSET DATE: 06/18/2022 Left TKA  SUBJECTIVE:   SUBJECTIVE STATEMENT: He called insurance & they do not cover fitness center membership but had him register for [employer] program.    PERTINENT HISTORY:  OA, back pain, obesity, HTN, CVA, contact dermatitis  PAIN:  Are you having pain? Yes: NPRS scale:  0/10 today, and since last PT lowest 0/10 & highest 3/10 Pain location: L knee in general  Pain description: tight "like something is pulling on it", still numb too  Aggravating factors: bending, turning over in sleep Relieving factors: meds, ice, elevation, pillows between legs during sleep, CPM   PRECAUTIONS: None  WEIGHT BEARING RESTRICTIONS: No  FALLS:  Has patient fallen in last 6 months? No  LIVING ENVIRONMENT: Lives with: lives with their spouse and adult niece Lives in: House Stairs: Yes: Internal: 14  steps; on left going up and External: 4 steps; on right going up  bathroom & bedroom main level, his fish aquarium is upstairs. 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  PATIENT GOALS:  return to work, household activities.  NEXT MD VISIT: 07/31/2022  OBJECTIVE:   OBJECTIVE:   DIAGNOSTIC FINDINGS: 06/18/2022 - Postsurgical changes from right knee arthroplasty. Evidence of periprosthetic fracture. No loosening. Overlying external material limits the ability to assess for soft tissue abnormality. Within this limitation no focal abnormality is visualized.  PATIENT SURVEYS:  EVAL: FOTO intake: 61%   predicted:  70%  EDEMA:  RLE: above knee 47.5cm  around knee 45cm  below knee 42cm LLE: above knee 56.3cm  around knee 55cm below knee 44cm  POSTURE: rounded shoulders, forward head, flexed trunk , and weight shift right  PALPATION: Tenderness along joint line & scar. Steri-strips in place. Small scabs still present on incision.   LOWER EXTREMITY ROM:   ROM Left eval Left  07/15/22 Left 07/20/22 Left 07/27/22 Left 07/30/22 Left 08/05/22  Knee flexion Supine  P: 94* Seated P:111* A: 102* Seated P:111* A: 103* Seated P:113* A: 103* Seated P:120* A: 107*   Knee extension Seated: LAQ A: -18* Supine  P: -6* A: quad set -9* Seated: LAQ A: -9* Supine  P: -3* A: quad set -4* Seated: LAQ A: -9* Supine  P: -3* A: quad set -4* Seated: LAQ A: -9* Standing A: TKE -3* Seated A: -4* Standing A: 0* Seated A: 0* LAQ   (Blank rows = not tested)  LOWER EXTREMITY MMT:  MMT Left eval Right 1214/23 Left 07/30/22  Knee flexion 3-/5 HH dynameter 55.2# & 53.8# HH dynameter 30.2# & 27.4# LLE:RLE 52.8%  Knee extension 3-/5 HH dynameter 69.3# & 68.7# HH dynameter 44.1# & 43.1# LLE:RLE 63.2%   (Blank rows = not  tested)  FUNCTIONAL TESTS:  18 inch chair transfer: minA using BUEs on armrests to RW  GAIT: Distance walked: 80' Assistive device utilized: Environmental consultant - 2 wheeled Level of assistance: SBA Comments: excessive weight bearing BUEs and PWB on LLE   TODAY'S TREATMENT                                                                           DATE:  08/05/2022: Therapeutic Exercise: Leg press back flat 125# 15 reps; LLE 62# 10 reps 2 sets & RLE 1 set Hamstring stretch SLR w/strap PT manual assist knee ext  30 sec 2 reps Stairs 11 step flight alternating with Lt rail 1 flight. Verbal cues on technique Squats with PT demo & verbal cues on technique. 5 reps no wt;  5 reps 15# kettle bell with 90* turn to right reaching to overhead shelf and 5 reps 15# kettle bell with 90* turn to left reaching to overhead shelf Pt amb carrying 15# kettle bell 75' in ea UE 2 sets.  Knee ext machine BLEs 25# 15 reps 1 set, single leg 10# LLE 2 sets RLE 1 sets; PT reviewed instruction in machine set up & pt verbalized understanding.    Neuromuscular Re-ed: Slider to 3 cones (ant-lat, lat & post-lat) with stance knee flexion on out & ext on in motion. UE support on sink as safety. 5 reps ea LE. Braiding 2 reps with counter support & 2 reps without UE support.  08/03/2022: Therapeutic Exercise: Precor seat 10 level 3 with BLEs full revolution 6 min  Stairs 11 step flight alternating with Lt rail 1 flight. Verbal cues on technique Squats with PT demo & verbal cues on technique. 5 reps no wt & 10 reps 15# kettle bell.  Pt amb carrying 15# kettle bell 75' in ea UE 2 sets.  Knee ext machine BLEs 25# 15 reps 1 set, single leg 10# LLE 3 sets RLE 2 sets; PT instructed in machine set up & pt verbalized understanding.  Knee flexion machine BLEs 35# 15 reps 1 set, single leg 15# LLE 3 sets RLE 2 sets; PT instructed in machine set up & pt verbalized understanding.   Neuromuscular Re-ed: Slider to 3 cones (ant-lat, lat &  post-lat) with stance knee flexion on out & ext on in motion. UE support on sink as safety. 5 reps ea LE. Tandem gait on foam beam 3 laps. Side stepping on foam beam 3 ea direction.  Incision has small opening with light bleeding with no signs of infection. Small circular area in middle that could be internal suture working way out.   07/30/2022: Therapeutic Exercise: Precor seat 10 level 3 BLEs/ BUEs 4 min, then level 1 with BLEs only 4 min  Leg Press bil 125# 15reps; LLE only 62# 15 reps Stairs 11 step flight: initial flight 2 rails 3 steps step to using LLE to descend then alternating; 2nd flight alternating descend single rail switching sides half way. Ascend alterating pattern 1st flight & 2nd flight descend single rail switching sides half way PT educated pt on recommendation of ongoing fitness for minimum of one year. PT recommending starting 1x/wk in addition to PT to enable problem solving and creating behavior.  Recommend leg press, knee ext & knee flex machines but putting other machines like UE between to avoid fatigue and 48hrs bw strength exercises and bike building up to 20-30 minutes. Pt verbalized understanding & is going to check into options for [employer] vs [employer] vs [employer].  See objective for ROM & MMT.    HOME EXERCISE PROGRAM: Access Code: [email or web address] URL: [email or web address] Date: 07/20/2022 Prepared by: Jamey Reas  Exercises - Ankle Alphabet in Elevation  - 2-4 x daily - 7 x weekly - 1 sets - 1 reps - Quad Setting and Stretching  - 2-4 x daily - 7 x weekly - 5-10 sets - 10 reps - prop 5-10 minutes & quad set5 seconds hold - Supine Leg Press  - 2-4 x daily - 7 x weekly - 5-10 sets - 10 reps - 5 seconds hold - Supine Heel Slide with  Strap  - 2-3 x daily - 7 x weekly - 2-3 sets - 10 reps - 5 seconds hold - Supine Straight Leg Raises  - 2-3 x daily - 7 x weekly - 2-3 sets - 10 reps - 5 seconds hold - Seated Knee Flexion Extension AROM   - 2-4 x daily  - 7 x weekly - 2-3 sets - 10 reps - 5 seconds hold - Seated straight leg lifts  - 2-3 x daily - 7 x weekly - 2-3 sets - 10 reps - 5 seconds hold - Seated Hamstring Stretch with Strap  - 2-4 x daily - 7 x weekly - 1 sets - 3 reps - 20-30 seconds hold - Seated Passive Knee Extension with Weight  - 1-3 x daily - 7 x weekly - 1 sets - 2-3 reps - 1-2 minutes hold - Seated Hamstring Stretch with Chair  - 1-3 x daily - 7 x weekly - 1 sets - 2-3 reps - 20-30 seconds hold - Gastroc Stretch on Step  - 1-3 x daily - 7 x weekly - 1 sets - 2-3 reps - 20-30 seconds hold - Supine Quadriceps Stretch with Strap on Table  - 1-3 x daily - 7 x weekly - 1 sets - 2-3 reps - 20-30 seconds hold  ASSESSMENT: CLINICAL IMPRESSION: PT continues to slowly progress functional strength including work simulated tasks. He is improving with ability to perform these tasks but with significantly lower weights that he reports for his work.  Pt continues to benefit from skilled PT.   OBJECTIVE IMPAIRMENTS: Abnormal gait, decreased activity tolerance, decreased balance, decreased endurance, decreased knowledge of condition, decreased knowledge of use of DME, decreased mobility, difficulty walking, decreased ROM, decreased strength, increased edema, increased muscle spasms, impaired flexibility, obesity, and pain.   ACTIVITY LIMITATIONS: carrying, lifting, bending, sitting, standing, squatting, sleeping, stairs, transfers, bed mobility, and locomotion level  PARTICIPATION LIMITATIONS: meal prep, cleaning, community activity, occupation, and yard work  PERSONAL FACTORS: 3+ comorbidities: see PMH  are also affecting patient's functional outcome.   REHAB POTENTIAL: Good  CLINICAL DECISION MAKING: Stable/uncomplicated  EVALUATION COMPLEXITY: Low   GOALS: Goals reviewed with patient? Yes  SHORT TERM GOALS: (target date 08/07/2022)   1.  Patient will demonstrate independent use of home exercise program to maintain progress from  in clinic treatments. Goal status: MET 08/05/22  2. Patient reports 25% improvement in left knee pain.  Goal Status:MET 08/05/22  3. Left knee PROM -3* ext to 100* flex  Goal Status:MET 08/05/22  LONG TERM GOALS: (target dates for all long term goals are 10 weeks  09/18/2021 )   1. Patient will demonstrate/report pain at worst less than or equal to 2/10 to facilitate minimal limitation in daily activity secondary to pain symptoms.  Goal status: New   2. Patient will demonstrate independent use of home exercise program to facilitate ability to maintain/progress functional gains from skilled physical therapy services.  Goal status: New   3. Patient will demonstrate FOTO outcome > or = 70 % to indicate reduced disability due to condition.  Goal status: New   4.  Patient will demonstrate left LE MMT 5/5 throughout to faciltiate usual transfers, stairs, squatting at [facility] for daily life.   Goal status: New   5.  Patient will demonstrate ability to perform work task of lifting & pushing weighted cart.  Goal status: New   6.  patient ambulates >500', negotiates ramps, curbs & stairs single rail independent without assistive device.  Goal status: New   7.  Left knee AROM 0* ext standing and 110* flexion seated. Goal Status: New   PLAN:  PT FREQUENCY: 2-3x/week  PT DURATION: 10 weeks  PLANNED INTERVENTIONS: Therapeutic exercises, Therapeutic activity, Neuro Muscular re-education, Balance training, Gait training, Patient/Family education, Joint mobilization, Stair training, DME instructions, Dry Needling, Electrical stimulation, Traction, Cryotherapy, vasopneumatic device, Moist heat, Taping, Ultrasound, Ionotophoresis 62m/ml Dexamethasone, and Manual therapy.  All included unless contraindicated  PLAN FOR NEXT SESSION:  continue Manual therapy prn. Strengthening exercises & Progress to standing functional exercises including some work simulation. Standing balance.     RJamey Reas PT, DPT 08/05/2022, 4:23 PM

## 2022-08-06 ENCOUNTER — Encounter: Payer: 59 | Admitting: Physical Therapy

## 2022-08-12 ENCOUNTER — Encounter: Payer: 59 | Admitting: Physical Therapy

## 2022-08-18 ENCOUNTER — Encounter: Payer: Self-pay | Admitting: Physical Therapy

## 2022-08-18 ENCOUNTER — Ambulatory Visit (INDEPENDENT_AMBULATORY_CARE_PROVIDER_SITE_OTHER): Payer: 59 | Admitting: Physical Therapy

## 2022-08-18 DIAGNOSIS — M25662 Stiffness of left knee, not elsewhere classified: Secondary | ICD-10-CM

## 2022-08-18 DIAGNOSIS — M6281 Muscle weakness (generalized): Secondary | ICD-10-CM | POA: Diagnosis not present

## 2022-08-18 DIAGNOSIS — R2689 Other abnormalities of gait and mobility: Secondary | ICD-10-CM | POA: Diagnosis not present

## 2022-08-18 DIAGNOSIS — M25562 Pain in left knee: Secondary | ICD-10-CM

## 2022-08-18 DIAGNOSIS — R2681 Unsteadiness on feet: Secondary | ICD-10-CM

## 2022-08-18 DIAGNOSIS — R600 Localized edema: Secondary | ICD-10-CM

## 2022-08-18 NOTE — Therapy (Signed)
OUTPATIENT PHYSICAL THERAPY LOWER EXTREMITY TREATMENT   Patient Name: Sean Lee MRN: [id number] DOB:[birth year], 62 y.o., male Today's Date: 08/18/2022     END OF SESSION:  PT End of Session - 08/18/22 0803     Visit Number 12    Number of Visits 22    Date for PT Re-Evaluation 09/18/22    Authorization Type [employer]    Authorization Time Period 20% co-insurance    Authorization - Number of Visits 50    Progress Note Due on Visit 61    PT Start Time 0800    PT Stop Time 0845    PT Time Calculation (min) 45 min    Activity Tolerance Patient tolerated treatment well    Behavior During Therapy WFL for tasks assessed/performed                   Past Medical History:  Diagnosis Date   Contact dermatitis and other eczema due to plants (except food)    H/O: CVA (cardiovascular accident)    Carotid doppler neg (4/08)   Hematuria    Obesity, unspecified    Other and unspecified hyperlipidemia    Hx - no medication/diet controll and weight loss   Sacroiliitis, not elsewhere classified ([place])    Sleep apnea    uses CPAP but not every night   Unspecified essential hypertension    Varicose veins of left lower extremity    Past Surgical History:  Procedure Laterality Date   Carotid dopplers  11/16/2006   Negative   HERNIA REPAIR     umbilical   TOTAL KNEE ARTHROPLASTY Left 06/18/2022   TOTAL KNEE ARTHROPLASTY Left 06/18/2022   Procedure: LEFT TOTAL KNEE ARTHROPLASTY;  Surgeon: Leandrew Koyanagi, MD;  Location: [place];  Service: Orthopedics;  Laterality: Left;   Patient Active Problem List   Diagnosis Date Noted   Status post total left knee replacement 06/18/2022   Primary osteoarthritis of left knee 01/01/2022   Primary osteoarthritis of right knee 01/01/2022   Left knee pain 08/65/7846   Umbilical hernia 96/29/5284   ED (erectile dysfunction) 12/31/2020   Insomnia 04/02/2020   Right ankle swelling 07/25/2019   Gout 11/03/2018   Chronic pain of right ankle  08/31/2018   Routine general medical examination at a health care facility 11/01/2017   Prostate cancer screening 06/28/2017   Alopecia 11/04/2016   Back pain 05/06/2016   Sleep disorder 08/28/2015   Colon cancer screening 08/28/2015   Prediabetes 01/02/2013   Varicosities of leg 03/21/2012   Rectus diastasis of lower abdomen 03/21/2012   Obstructive sleep apnea 10/15/2009   Class 2 severe obesity due to excess calories with serious comorbidity and body mass index (BMI) of 38.0 to 38.9 in adult [facility]) 09/02/2009   Hyperlipidemia 02/02/2007   HEMATURIA, MICROSCOPIC 02/02/2007   Essential hypertension 02/01/2007   CEREBROVASCULAR ACCIDENT, HX OF 02/01/2007    PCP: Abner Greenspan, MD  REFERRING PROVIDER: Leandrew Koyanagi, MD  REFERRING DIAG: [phone] (ICD-10-CM) - Status post total left knee replacement   THERAPY DIAG:  Stiffness of left knee, not elsewhere classified  Acute pain of left knee  Other abnormalities of gait and mobility  Muscle weakness (generalized)  Unsteadiness on feet  Localized edema  Rationale for Evaluation and Treatment: Rehabilitation  ONSET DATE: 06/18/2022 Left TKA  SUBJECTIVE:   SUBJECTIVE STATEMENT:  He got flu but continued to work his knee. He joined [employer] but has not gone yet.   PERTINENT HISTORY: OA, back  pain, obesity, HTN, CVA, contact dermatitis  PAIN:  Are you having pain? Yes: NPRS scale:  0/10 today, and since last PT lowest 0/10 & highest 3/10 Pain location: L knee in general  Pain description: tight "like something is pulling on it", still numb too  Aggravating factors: bending, turning over in sleep Relieving factors: meds, ice, elevation, pillows between legs during sleep, CPM   PRECAUTIONS: None  WEIGHT BEARING RESTRICTIONS: No  FALLS:  Has patient fallen in last 6 months? No  LIVING ENVIRONMENT: Lives with: lives with their spouse and adult niece Lives in: House Stairs: Yes: Internal: 14 steps; on left going up and  External: 4 steps; on right going up  bathroom & bedroom main level, his fish aquarium is upstairs. 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  PATIENT GOALS:  return to work, household activities.  NEXT MD VISIT: 07/31/2022  OBJECTIVE:   OBJECTIVE:   DIAGNOSTIC FINDINGS: 06/18/2022 - Postsurgical changes from right knee arthroplasty. Evidence of periprosthetic fracture. No loosening. Overlying external material limits the ability to assess for soft tissue abnormality. Within this limitation no focal abnormality is visualized.  PATIENT SURVEYS:  EVAL: FOTO intake: 61%   predicted:  70% 08/18/2022: FOTO visit 11 72%  EDEMA:  RLE: above knee 47.5cm  around knee 45cm  below knee 42cm LLE: above knee 56.3cm  around knee 55cm below knee 44cm  POSTURE: rounded shoulders, forward head, flexed trunk , and weight shift right  PALPATION: Tenderness along joint line & scar. Steri-strips in place. Small scabs still present on incision.   LOWER EXTREMITY ROM:   ROM Left eval Left  07/15/22 Left 07/20/22 Left 07/27/22 Left 07/30/22 Left 08/05/22 Left 08/18/22  Knee flexion Supine  P: 94* Seated P:111* A: 102* Seated P:111* A: 103* Seated P:113* A: 103* Seated P:120* A: 107*    Knee extension Seated: LAQ A: -18* Supine  P: -6* A: quad set -9* Seated: LAQ A: -9* Supine  P: -3* A: quad set -4* Seated: LAQ A: -9* Supine  P: -3* A: quad set -4* Seated: LAQ A: -9* Standing A: TKE -3* Seated A: -4* Standing A: 0* Seated A: 0* LAQ Seated A: 0* LAQ   (Blank rows = not tested)  LOWER EXTREMITY MMT:  MMT Left eval Right 1214/23 Left 07/30/22  Knee flexion 3-/5 HH dynameter 55.2# & 53.8# HH dynameter 30.2# & 27.4# LLE:RLE 52.8%  Knee extension 3-/5 HH dynameter 69.3# & 68.7# HH dynameter 44.1# & 43.1# LLE:RLE 63.2%    (Blank rows = not tested)  FUNCTIONAL TESTS:  18 inch chair transfer: minA using BUEs on armrests to RW  GAIT: Distance walked: 80' Assistive device utilized: Environmental consultant - 2 wheeled Level of assistance: SBA Comments: excessive weight bearing BUEs and PWB on LLE   TODAY'S TREATMENT                                                                           DATE:  08/18/2022: Therapeutic Exercise: Precor seat 10 level 1 for 3 min & level 3 for 5 min with BLEs  full revolution Knee ext machine 35# BLEs 15 reps; 15# 10 reps single leg LLE 3 sets & RLE 2 sets;  Pt instructed pt in set-up & recommendations for [employer]. Pt verbalized understanding. Knee flex machine 45# BLEs 15 reps; 20# 10 reps single leg LLE 3 sets & RLE 2 sets;  Pt instructed pt in set-up & recommendations for [employer]. Pt verbalized understanding. Squats with PT demo & verbal cues on technique. 3 reps 20# kettle bell with 90* turn to right reaching to overhead shelf and 3 reps 20# kettle bell with 90* turn to left reaching to overhead shelf Walking forward/backward with pulley wts 50# (25# per stack) 5 reps with wt posterior & 5 reps wt anterior to build push/pull force for work. Squat lift with pulley low setting 55# 10 reps.   Last 3 exercises working towards functional strength for work.  Pt verbalized understanding for building force & endurance.    Neuromuscular Re-ed: Braiding 8' X 3 reps without UE support. Tandem gait forward & backward 8" X 2 reps without UE support  08/05/2022: Therapeutic Exercise: Leg press back flat 125# 15 reps; LLE 62# 10 reps 2 sets & RLE 1 set Hamstring stretch SLR w/strap PT manual assist knee ext 30 sec 2 reps Stairs 11 step flight alternating with Lt rail 1 flight. Verbal cues on technique Squats with PT demo & verbal cues on technique. 5 reps no wt;  5 reps 15# kettle bell with 90* turn to right reaching to overhead shelf and 5 reps 15# kettle bell with 90* turn to left reaching to overhead  shelf Pt amb carrying 15# kettle bell 75' in ea UE 2 sets.  Knee ext machine BLEs 25# 15 reps 1 set, single leg 10# LLE 2 sets RLE 1 sets; PT reviewed instruction in machine set up & pt verbalized understanding.    Neuromuscular Re-ed: Slider to 3 cones (ant-lat, lat & post-lat) with stance knee flexion on out & ext on in motion. UE support on sink as safety. 5 reps ea LE. Braiding 2 reps with counter support & 2 reps without UE support.  08/03/2022: Therapeutic Exercise: Precor seat 10 level 3 with BLEs full revolution 6 min  Stairs 11 step flight alternating with Lt rail 1 flight. Verbal cues on technique Squats with PT demo & verbal cues on technique. 5 reps no wt & 10 reps 15# kettle bell.  Pt amb carrying 15# kettle bell 75' in ea UE 2 sets.  Knee ext machine BLEs 25# 15 reps 1 set, single leg 10# LLE 3 sets RLE 2 sets; PT instructed in machine set up & pt verbalized understanding.  Knee flexion machine BLEs 35# 15 reps 1 set, single leg 15# LLE 3 sets RLE 2 sets; PT instructed in machine set up & pt verbalized understanding.   Neuromuscular Re-ed: Slider to 3 cones (ant-lat, lat & post-lat) with stance knee flexion on out & ext on in motion. UE support on sink as safety. 5 reps ea LE. Tandem gait on foam beam 3 laps. Side stepping on foam beam 3 ea direction.  Incision has small opening with light bleeding with no signs of infection. Small circular area in middle that could be internal suture working way out.     HOME EXERCISE PROGRAM: Access Code: [email or web address] URL: [email or web address] Date: 07/20/2022 Prepared by: Jamey Reas  Exercises - Ankle Alphabet in Elevation  - 2-4 x daily - 7 x weekly - 1 sets - 1 reps - Quad Setting  and Stretching  - 2-4 x daily - 7 x weekly - 5-10 sets - 10 reps - prop 5-10 minutes & quad set5 seconds hold - Supine Leg Press  - 2-4 x daily - 7 x weekly - 5-10 sets - 10 reps - 5 seconds hold - Supine Heel Slide with Strap  - 2-3 x  daily - 7 x weekly - 2-3 sets - 10 reps - 5 seconds hold - Supine Straight Leg Raises  - 2-3 x daily - 7 x weekly - 2-3 sets - 10 reps - 5 seconds hold - Seated Knee Flexion Extension AROM   - 2-4 x daily - 7 x weekly - 2-3 sets - 10 reps - 5 seconds hold - Seated straight leg lifts  - 2-3 x daily - 7 x weekly - 2-3 sets - 10 reps - 5 seconds hold - Seated Hamstring Stretch with Strap  - 2-4 x daily - 7 x weekly - 1 sets - 3 reps - 20-30 seconds hold - Seated Passive Knee Extension with Weight  - 1-3 x daily - 7 x weekly - 1 sets - 2-3 reps - 1-2 minutes hold - Seated Hamstring Stretch with Chair  - 1-3 x daily - 7 x weekly - 1 sets - 2-3 reps - 20-30 seconds hold - Gastroc Stretch on Step  - 1-3 x daily - 7 x weekly - 1 sets - 2-3 reps - 20-30 seconds hold - Supine Quadriceps Stretch with Strap on Table  - 1-3 x daily - 7 x weekly - 1 sets - 2-3 reps - 20-30 seconds hold  ASSESSMENT: CLINICAL IMPRESSION: Pt appears to understand principle for building strength & endurance in knee / LEs to enable return to work without injury.  Pt continues to improve functional strength in left knee.  Pt continues to benefit from skilled PT.   OBJECTIVE IMPAIRMENTS: Abnormal gait, decreased activity tolerance, decreased balance, decreased endurance, decreased knowledge of condition, decreased knowledge of use of DME, decreased mobility, difficulty walking, decreased ROM, decreased strength, increased edema, increased muscle spasms, impaired flexibility, obesity, and pain.   ACTIVITY LIMITATIONS: carrying, lifting, bending, sitting, standing, squatting, sleeping, stairs, transfers, bed mobility, and locomotion level  PARTICIPATION LIMITATIONS: meal prep, cleaning, community activity, occupation, and yard work  PERSONAL FACTORS: 3+ comorbidities: see PMH  are also affecting patient's functional outcome.   REHAB POTENTIAL: Good  CLINICAL DECISION MAKING: Stable/uncomplicated  EVALUATION COMPLEXITY:  Low   GOALS: Goals reviewed with patient? Yes  SHORT TERM GOALS: (target date 08/07/2022)   1.  Patient will demonstrate independent use of home exercise program to maintain progress from in clinic treatments. Goal status: MET 08/05/22  2. Patient reports 25% improvement in left knee pain.  Goal Status:MET 08/05/22  3. Left knee PROM -3* ext to 100* flex  Goal Status:MET 08/05/22  LONG TERM GOALS: (target dates for all long term goals are 10 weeks  09/18/2021 )   1. Patient will demonstrate/report pain at worst less than or equal to 2/10 to facilitate minimal limitation in daily activity secondary to pain symptoms.  Goal status: Ongoing 08/18/2022   2. Patient will demonstrate independent use of home exercise program to facilitate ability to maintain/progress functional gains from skilled physical therapy services.  Goal status: Ongoing 08/18/2022   3. Patient will demonstrate FOTO outcome > or = 70 % to indicate reduced disability due to condition.  Goal status:MET 08/18/2022   4.  Patient will demonstrate left LE MMT 5/5 throughout to  faciltiate usual transfers, stairs, squatting at PLOF for daily life.   Goal status: Ongoing 08/18/2022   5.  Patient will demonstrate ability to perform work task of lifting & pushing weighted cart.  Goal status: Ongoing 08/18/2022   6.  patient ambulates >500', negotiates ramps, curbs & stairs single rail independent without assistive device.  Goal status: Ongoing 08/18/2022   7.  Left knee AROM 0* ext standing and 110* flexion seated. Goal Status: Ongoing 08/18/2022   PLAN:  PT FREQUENCY: 2-3x/week  PT DURATION: 10 weeks  PLANNED INTERVENTIONS: Therapeutic exercises, Therapeutic activity, Neuro Muscular re-education, Balance training, Gait training, Patient/Family education, Joint mobilization, Stair training, DME instructions, Dry Needling, Electrical stimulation, Traction, Cryotherapy, vasopneumatic device, Moist heat, Taping, Ultrasound,  Ionotophoresis 15m/ml Dexamethasone, and Manual therapy.  All included unless contraindicated  PLAN FOR NEXT SESSION:   Strengthening exercises & Progress to standing functional exercises including some work simulation. Standing balance.     RJamey Reas PT, DPT 08/18/2022, 8:51 AM

## 2022-08-21 ENCOUNTER — Ambulatory Visit (INDEPENDENT_AMBULATORY_CARE_PROVIDER_SITE_OTHER): Payer: 59 | Admitting: Physical Therapy

## 2022-08-21 ENCOUNTER — Encounter: Payer: Self-pay | Admitting: Physical Therapy

## 2022-08-21 DIAGNOSIS — M25662 Stiffness of left knee, not elsewhere classified: Secondary | ICD-10-CM

## 2022-08-21 DIAGNOSIS — R2689 Other abnormalities of gait and mobility: Secondary | ICD-10-CM | POA: Diagnosis not present

## 2022-08-21 DIAGNOSIS — M25562 Pain in left knee: Secondary | ICD-10-CM

## 2022-08-21 DIAGNOSIS — R600 Localized edema: Secondary | ICD-10-CM

## 2022-08-21 DIAGNOSIS — M6281 Muscle weakness (generalized): Secondary | ICD-10-CM | POA: Diagnosis not present

## 2022-08-21 DIAGNOSIS — R2681 Unsteadiness on feet: Secondary | ICD-10-CM

## 2022-08-21 NOTE — Therapy (Signed)
OUTPATIENT PHYSICAL THERAPY LOWER EXTREMITY TREATMENT   Patient Name: Sean Lee MRN: [id number] DOB:30-Aug-1960, 62 y.o., male Today's Date: 08/21/2022     END OF SESSION:  PT End of Session - 08/21/22 0845     Visit Number 13    Number of Visits 22    Date for PT Re-Evaluation 09/18/22    Progress Note Due on Visit 63    PT Start Time 0804    PT Stop Time 4128    PT Time Calculation (min) 40 min    Activity Tolerance Patient tolerated treatment well    Behavior During Therapy [facility] for tasks assessed/performed                    Past Medical History:  Diagnosis Date   Contact dermatitis and other eczema due to plants (except food)    H/O: CVA (cardiovascular accident)    Carotid doppler neg (4/08)   Hematuria    Obesity, unspecified    Other and unspecified hyperlipidemia    Hx - no medication/diet controll and weight loss   Sacroiliitis, not elsewhere classified ([place])    Sleep apnea    uses CPAP but not every night   Unspecified essential hypertension    Varicose veins of left lower extremity    Past Surgical History:  Procedure Laterality Date   Carotid dopplers  11/16/2006   Negative   HERNIA REPAIR     umbilical   TOTAL KNEE ARTHROPLASTY Left 06/18/2022   TOTAL KNEE ARTHROPLASTY Left 06/18/2022   Procedure: LEFT TOTAL KNEE ARTHROPLASTY;  Surgeon: Leandrew Koyanagi, MD;  Location: [facility];  Service: Orthopedics;  Laterality: Left;   Patient Active Problem List   Diagnosis Date Noted   Status post total left knee replacement 06/18/2022   Primary osteoarthritis of left knee 01/01/2022   Primary osteoarthritis of right knee 01/01/2022   Left knee pain 78/67/6720   Umbilical hernia 94/70/9628   ED (erectile dysfunction) 12/31/2020   Insomnia 04/02/2020   Right ankle swelling 07/25/2019   Gout 11/03/2018   Chronic pain of right ankle 08/31/2018   Routine general medical examination at a health care facility 11/01/2017   Prostate cancer screening  06/28/2017   Alopecia 11/04/2016   Back pain 05/06/2016   Sleep disorder 08/28/2015   Colon cancer screening 08/28/2015   Prediabetes 01/02/2013   Varicosities of leg 03/21/2012   Rectus diastasis of lower abdomen 03/21/2012   Obstructive sleep apnea 10/15/2009   Class 2 severe obesity due to excess calories with serious comorbidity and body mass index (BMI) of 38.0 to 38.9 in adult [facility]) 09/02/2009   Hyperlipidemia 02/02/2007   HEMATURIA, MICROSCOPIC 02/02/2007   Essential hypertension 02/01/2007   CEREBROVASCULAR ACCIDENT, HX OF 02/01/2007    PCP: Abner Greenspan, MD  REFERRING PROVIDER: Leandrew Koyanagi, MD  REFERRING DIAG: [phone] (ICD-10-CM) - Status post total left knee replacement   THERAPY DIAG:  Stiffness of left knee, not elsewhere classified  Acute pain of left knee  Other abnormalities of gait and mobility  Muscle weakness (generalized)  Unsteadiness on feet  Localized edema  Rationale for Evaluation and Treatment: Rehabilitation  ONSET DATE: 06/18/2022 Left TKA  SUBJECTIVE:   SUBJECTIVE STATEMENT:  Pt reporting pain in his left knee of 3/10. Pt stating he went to [facility] yesterday and it went well. Pt stating he wants to start swimming.   PERTINENT HISTORY: OA, back pain, obesity, HTN, CVA, contact dermatitis  PAIN:  Are you having  pain? Yes: NPRS scale:  0/10 today, and since last PT lowest 0/10 & highest 3/10 Pain location: L knee in general  Pain description: tight "like something is pulling on it", still numb too  Aggravating factors: bending, turning over in sleep Relieving factors: meds, ice, elevation, pillows between legs during sleep, CPM   PRECAUTIONS: None  WEIGHT BEARING RESTRICTIONS: No  FALLS:  Has patient fallen in last 6 months? No  LIVING ENVIRONMENT: Lives with: lives with their spouse and adult niece Lives in: House Stairs: Yes: Internal: 14 steps; on left going up and External: 4 steps; on right going up  bathroom & bedroom main  level, his fish aquarium is upstairs. 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  PATIENT GOALS:  return to work, household activities.  NEXT MD VISIT: 07/31/2022  OBJECTIVE:     DIAGNOSTIC FINDINGS: 06/18/2022 - Postsurgical changes from right knee arthroplasty. Evidence of periprosthetic fracture. No loosening. Overlying external material limits the ability to assess for soft tissue abnormality. Within this limitation no focal abnormality is visualized.  PATIENT SURVEYS:  EVAL: FOTO intake: 61%   predicted:  70% 08/18/2022: FOTO visit 11 72%  EDEMA:  RLE: above knee 47.5cm  around knee 45cm  below knee 42cm LLE: above knee 56.3cm  around knee 55cm below knee 44cm  POSTURE: rounded shoulders, forward head, flexed trunk , and weight shift right  PALPATION: Tenderness along joint line & scar. Steri-strips in place. Small scabs still present on incision.   LOWER EXTREMITY ROM:   ROM Left eval Left  07/15/22 Left 07/20/22 Left 07/27/22 Left 07/30/22 Left 08/05/22 Left 08/18/22 Left 08/21/22  Knee flexion Supine  P: 94* Seated P:111* A: 102* Seated P:111* A: 103* Seated P:113* A: 103* Seated P:120* A: 107*   Supine:  A: 118 P: 122  Knee extension Seated: LAQ A: -18* Supine  P: -6* A: quad set -9* Seated: LAQ A: -9* Supine  P: -3* A: quad set -4* Seated: LAQ A: -9* Supine  P: -3* A: quad set -4* Seated: LAQ A: -9* Standing A: TKE -3* Seated A: -4* Standing A: 0* Seated A: 0* LAQ Seated A: 0* LAQ Supine A: 0   (Blank rows = not tested)  LOWER EXTREMITY MMT:  MMT Left eval Right 1214/23 Left 07/30/22  Knee flexion 3-/5 HH dynameter 55.2# & 53.8# HH dynameter 30.2# & 27.4# LLE:RLE 52.8%  Knee extension 3-/5 HH dynameter 69.3# & 68.7# HH dynameter 44.1# & 43.1# LLE:RLE 63.2%   (Blank rows = not  tested)  FUNCTIONAL TESTS:  18 inch chair transfer: minA using BUEs on armrests to RW  GAIT: Distance walked: 80' Assistive device utilized: Environmental consultant - 2 wheeled Level of assistance: SBA Comments: excessive weight bearing BUEs and PWB on LLE   TODAY'S TREATMENT                                                                           DATE:  08/21/2022: Therapeutic Exercise: Recumbent bike: Level 3, x 8 minutes, full revolutions Leg Press: 125# bil LE's x 20, Left LE  only: 75# x 20 Step ups on 8 inch step x 20 leading with left LE no UE support Knee ext machine 35# BLEs lifting, Left LE only lowering 2 x 10  Knee flex machine 45# BLEs 15 reps; 20# 10 reps single leg LLE 3 sets & RLE 2 sets;  Pt instructed pt in set-up & recommendations for [employer]. Pt verbalized understanding. TRX  2 x 10 Walking 55# back and forth with core activation x 10  Side stepping x 10 holding 35# (5 times each direction)      08/18/2022: Therapeutic Exercise: Precor seat 10 level 1 for 3 min & level 3 for 5 min with BLEs full revolution Knee ext machine 35# BLEs 15 reps; 15# 10 reps single leg LLE 3 sets & RLE 2 sets;  Pt instructed pt in set-up & recommendations for [employer]. Pt verbalized understanding. Knee flex machine 45# BLEs 15 reps; 20# 10 reps single leg LLE 3 sets & RLE 2 sets;  Pt instructed pt in set-up & recommendations for [employer]. Pt verbalized understanding. Squats with PT demo & verbal cues on technique. 3 reps 20# kettle bell with 90* turn to right reaching to overhead shelf and 3 reps 20# kettle bell with 90* turn to left reaching to overhead shelf Walking forward/backward with pulley wts 50# (25# per stack) 5 reps with wt posterior & 5 reps wt anterior to build push/pull force for work. Squat lift with pulley low setting 55# 10 reps.   Last 3 exercises working towards functional strength for work.  Pt verbalized understanding for building force & endurance.    Neuromuscular Re-ed: Braiding 8' X 3  reps without UE support. Tandem gait forward & backward 8" X 2 reps without UE support  08/05/2022: Therapeutic Exercise: Leg press back flat 125# 15 reps; LLE 62# 10 reps 2 sets & RLE 1 set Hamstring stretch SLR w/strap PT manual assist knee ext 30 sec 2 reps Stairs 11 step flight alternating with Lt rail 1 flight. Verbal cues on technique Squats with PT demo & verbal cues on technique. 5 reps no wt;  5 reps 15# kettle bell with 90* turn to right reaching to overhead shelf and 5 reps 15# kettle bell with 90* turn to left reaching to overhead shelf Pt amb carrying 15# kettle bell 75' in ea UE 2 sets.  Knee ext machine BLEs 25# 15 reps 1 set, single leg 10# LLE 2 sets RLE 1 sets; PT reviewed instruction in machine set up & pt verbalized understanding.    Neuromuscular Re-ed: Slider to 3 cones (ant-lat, lat & post-lat) with stance knee flexion on out & ext on in motion. UE support on sink as safety. 5 reps ea LE. Braiding 2 reps with counter support & 2 reps without UE support.       HOME EXERCISE PROGRAM: Access Code: [email or web address] URL: [email or web address] Date: 07/20/2022 Prepared by: Jamey Reas  Exercises - Ankle Alphabet in Elevation  - 2-4 x daily - 7 x weekly - 1 sets - 1 reps - Quad Setting and Stretching  - 2-4 x daily - 7 x weekly - 5-10 sets - 10 reps - prop 5-10 minutes & quad set5 seconds hold - Supine Leg Press  - 2-4 x daily - 7 x weekly - 5-10 sets - 10 reps - 5 seconds hold - Supine Heel Slide with Strap  - 2-3 x daily - 7 x weekly - 2-3 sets - 10 reps - 5 seconds hold - Supine Straight  Leg Raises  - 2-3 x daily - 7 x weekly - 2-3 sets - 10 reps - 5 seconds hold - Seated Knee Flexion Extension AROM   - 2-4 x daily - 7 x weekly - 2-3 sets - 10 reps - 5 seconds hold - Seated straight leg lifts  - 2-3 x daily - 7 x weekly - 2-3 sets - 10 reps - 5 seconds hold - Seated Hamstring Stretch with Strap  - 2-4 x daily - 7 x weekly - 1 sets - 3 reps - 20-30  seconds hold - Seated Passive Knee Extension with Weight  - 1-3 x daily - 7 x weekly - 1 sets - 2-3 reps - 1-2 minutes hold - Seated Hamstring Stretch with Chair  - 1-3 x daily - 7 x weekly - 1 sets - 2-3 reps - 20-30 seconds hold - Gastroc Stretch on Step  - 1-3 x daily - 7 x weekly - 1 sets - 2-3 reps - 20-30 seconds hold - Supine Quadriceps Stretch with Strap on Table  - 1-3 x daily - 7 x weekly - 1 sets - 2-3 reps - 20-30 seconds hold  ASSESSMENT: CLINICAL IMPRESSION: Pt challenged today with sidestepping with added weight as well as TRX squats. Pt's active left knee flexion ROM has improved to 118 degrees in supine. Continue skilled PT to maximize pt's function.      OBJECTIVE IMPAIRMENTS: Abnormal gait, decreased activity tolerance, decreased balance, decreased endurance, decreased knowledge of condition, decreased knowledge of use of DME, decreased mobility, difficulty walking, decreased ROM, decreased strength, increased edema, increased muscle spasms, impaired flexibility, obesity, and pain.   ACTIVITY LIMITATIONS: carrying, lifting, bending, sitting, standing, squatting, sleeping, stairs, transfers, bed mobility, and locomotion level  PARTICIPATION LIMITATIONS: meal prep, cleaning, community activity, occupation, and yard work  PERSONAL FACTORS: 3+ comorbidities: see PMH  are also affecting patient's functional outcome.   REHAB POTENTIAL: Good  CLINICAL DECISION MAKING: Stable/uncomplicated  EVALUATION COMPLEXITY: Low   GOALS: Goals reviewed with patient? Yes  SHORT TERM GOALS: (target date 08/07/2022)   1.  Patient will demonstrate independent use of home exercise program to maintain progress from in clinic treatments. Goal status: MET 08/05/22  2. Patient reports 25% improvement in left knee pain.  Goal Status:MET 08/05/22  3. Left knee PROM -3* ext to 100* flex  Goal Status:MET 08/05/22  LONG TERM GOALS: (target dates for all long term goals are 10 weeks   09/18/2021 )   1. Patient will demonstrate/report pain at worst less than or equal to 2/10 to facilitate minimal limitation in daily activity secondary to pain symptoms.  Goal status: Ongoing 08/18/2022   2. Patient will demonstrate independent use of home exercise program to facilitate ability to maintain/progress functional gains from skilled physical therapy services.  Goal status: Ongoing 08/18/2022   3. Patient will demonstrate FOTO outcome > or = 70 % to indicate reduced disability due to condition.  Goal status:MET 08/18/2022   4.  Patient will demonstrate left LE MMT 5/5 throughout to faciltiate usual transfers, stairs, squatting at [facility] for daily life.   Goal status: Ongoing 08/18/2022   5.  Patient will demonstrate ability to perform work task of lifting & pushing weighted cart.  Goal status: Ongoing 08/18/2022   6.  patient ambulates >500', negotiates ramps, curbs & stairs single rail independent without assistive device.  Goal status: Ongoing 08/18/2022   7.  Left knee AROM 0* ext standing and 110* flexion seated. Goal Status: Ongoing 08/18/2022  PLAN:  PT FREQUENCY: 2-3x/week  PT DURATION: 10 weeks  PLANNED INTERVENTIONS: Therapeutic exercises, Therapeutic activity, Neuro Muscular re-education, Balance training, Gait training, Patient/Family education, Joint mobilization, Stair training, DME instructions, Dry Needling, Electrical stimulation, Traction, Cryotherapy, vasopneumatic device, Moist heat, Taping, Ultrasound, Ionotophoresis '4mg'$ /ml Dexamethasone, and Manual therapy.  All included unless contraindicated  PLAN FOR NEXT SESSION:   Strengthening exercises & Progress to standing functional exercises including some work simulation. Standing balance.     Oretha Caprice, PT, MPT 08/21/2022, 8:46 AM

## 2022-08-24 ENCOUNTER — Ambulatory Visit (INDEPENDENT_AMBULATORY_CARE_PROVIDER_SITE_OTHER): Payer: 59 | Admitting: Family Medicine

## 2022-08-24 ENCOUNTER — Encounter: Payer: Self-pay | Admitting: Family Medicine

## 2022-08-24 VITALS — BP 129/70 | HR 84 | Temp 97.3°F | Ht 73.0 in | Wt 263.2 lb

## 2022-08-24 DIAGNOSIS — K429 Umbilical hernia without obstruction or gangrene: Secondary | ICD-10-CM

## 2022-08-24 NOTE — Progress Notes (Signed)
Subjective:    Patient ID: Sean Lee, male    DOB: 1961-01-08, 62 y.o.   MRN: [id number]  HPI Pt presents for umbilical hernia  Wt Readings from Last 3 Encounters:  08/24/22 263 lb 4 oz (119.4 kg)  06/18/22 270 lb (122.5 kg)  06/11/22 277 lb 8 oz (125.9 kg)   34.73 kg/m   Vitals:   08/24/22 1027  BP: (!) 144/64  Pulse: 84  Temp: (!) 97.3 F (36.3 C)  TempSrc: Temporal  SpO2: 100%  Weight: 263 lb 4 oz (119.4 kg)  Height: '6\' 1"'$  (1.854 m)   BP: 129/70  re check bp is improved   Abdominal hernia  We referred to a surgeon in may- he said he forgot or he did not get called  Then had a knee replacement   Umbilical hernia Hurts a little at times/not bad He can push it back in without a problem   When he sits up he sees abdominal muscles bulge (diastasis)   First umbilical hernia was done in [place]  ? If it was [facility]   Patient Active Problem List   Diagnosis Date Noted   Status post total left knee replacement 06/18/2022   Primary osteoarthritis of left knee 01/01/2022   Primary osteoarthritis of right knee 01/01/2022   Left knee pain 12/87/8676   Umbilical hernia 72/04/4708   ED (erectile dysfunction) 12/31/2020   Insomnia 04/02/2020   Right ankle swelling 07/25/2019   Gout 11/03/2018   Chronic pain of right ankle 08/31/2018   Routine general medical examination at a health care facility 11/01/2017   Prostate cancer screening 06/28/2017   Alopecia 11/04/2016   Back pain 05/06/2016   Sleep disorder 08/28/2015   Colon cancer screening 08/28/2015   Prediabetes 01/02/2013   Varicosities of leg 03/21/2012   Rectus diastasis of lower abdomen 03/21/2012   Obstructive sleep apnea 10/15/2009   Class 2 severe obesity due to excess calories with serious comorbidity and body mass index (BMI) of 38.0 to 38.9 in adult ([place]) 09/02/2009   Hyperlipidemia 02/02/2007   HEMATURIA, MICROSCOPIC 02/02/2007   Essential hypertension 02/01/2007   CEREBROVASCULAR ACCIDENT, HX OF  02/01/2007   Past Medical History:  Diagnosis Date   Contact dermatitis and other eczema due to plants (except food)    H/O: CVA (cardiovascular accident)    Carotid doppler neg (4/08)   Hematuria    Obesity, unspecified    Other and unspecified hyperlipidemia    Hx - no medication/diet controll and weight loss   Sacroiliitis, not elsewhere classified ([place])    Sleep apnea    uses CPAP but not every night   Unspecified essential hypertension    Varicose veins of left lower extremity    Past Surgical History:  Procedure Laterality Date   Carotid dopplers  11/16/2006   Negative   HERNIA REPAIR     umbilical   TOTAL KNEE ARTHROPLASTY Left 06/18/2022   TOTAL KNEE ARTHROPLASTY Left 06/18/2022   Procedure: LEFT TOTAL KNEE ARTHROPLASTY;  Surgeon: Leandrew Koyanagi, MD;  Location: [facility];  Service: Orthopedics;  Laterality: Left;   Social History   Tobacco Use   Smoking status: Never   Smokeless tobacco: Never  Vaping Use   Vaping Use: Never used  Substance Use Topics   Alcohol use: Yes    Alcohol/week: 4.0 standard drinks of alcohol    Types: 4 Cans of beer per week    Comment: weekends   Drug use: No   Family  History  Problem Relation Age of Onset   Hypertension Mother    Diabetes Mother    Hyperlipidemia Unknown        in family    Cancer Neg Hx    Esophageal cancer Neg Hx    Rectal cancer Neg Hx    Stomach cancer Neg Hx    Colon cancer Neg Hx    Allergies  Allergen Reactions   Benazepril Hcl Cough   Current Outpatient Medications on File Prior to Visit  Medication Sig Dispense Refill   cefadroxil (DURICEF) 500 MG capsule Take 1 capsule (500 mg total) by mouth 2 (two) times daily. To be taken after surgery 20 capsule 0   docusate sodium (COLACE) 100 MG capsule Take 1 capsule (100 mg total) by mouth daily as needed. 30 capsule 2   HYDROcodone-acetaminophen (NORCO) 5-325 MG tablet Take 1 tablet by mouth 3 (three) times daily as needed. 30 tablet 0   latanoprost  (XALATAN) 0.005 % ophthalmic solution Place 1 drop into both eyes at bedtime.     methocarbamol (ROBAXIN-750) 750 MG tablet Take 1 tablet (750 mg total) by mouth 2 (two) times daily as needed for muscle spasms. To be taken after surgery 30 tablet 2   olmesartan-hydrochlorothiazide (BENICAR HCT) 40-25 MG tablet TAKE 1 TABLET DAILY 90 tablet 0   ondansetron (ZOFRAN) 4 MG tablet Take 1 tablet (4 mg total) by mouth every 8 (eight) hours as needed for nausea or vomiting. 40 tablet 0   oxyCODONE-acetaminophen (PERCOCET) 5-325 MG tablet Take 1-2 tablets by mouth every 6 (six) hours as needed. To be taken after surgery 40 tablet 0   oxyCODONE-acetaminophen (PERCOCET) 5-325 MG tablet Take 1-2 tablets by mouth every 8 (eight) hours as needed. 40 tablet 0   sildenafil (VIAGRA) 50 MG tablet TAKE 1 TABLET (50 MG TOTAL) BY MOUTH DAILY AS NEEDED FOR ERECTILE DYSFUNCTION. 30 MINUTES BEFORE SEXUAL ACTIVITY 10 tablet 3   No current facility-administered medications on file prior to visit.      Review of Systems  Constitutional:  Negative for activity change, appetite change, fatigue, fever and unexpected weight change.  HENT:  Negative for congestion, rhinorrhea, sore throat and trouble swallowing.   Eyes:  Negative for pain, redness, itching and visual disturbance.  Respiratory:  Negative for cough, chest tightness, shortness of breath and wheezing.   Cardiovascular:  Negative for chest pain and palpitations.  Gastrointestinal:  Negative for abdominal pain, blood in stool, constipation, diarrhea and nausea.       Hernia  Endocrine: Negative for cold intolerance, heat intolerance, polydipsia and polyuria.  Genitourinary:  Negative for difficulty urinating, dysuria, frequency and urgency.  Musculoskeletal:  Negative for arthralgias, joint swelling and myalgias.  Skin:  Negative for pallor and rash.  Neurological:  Negative for dizziness, tremors, weakness, numbness and headaches.  Hematological:  Negative for  adenopathy. Does not bruise/bleed easily.  Psychiatric/Behavioral:  Negative for decreased concentration and dysphoric mood. The patient is not nervous/anxious.        Objective:   Physical Exam Constitutional:      General: He is not in acute distress.    Appearance: He is well-developed.  HENT:     Head: Normocephalic and atraumatic.  Eyes:     Conjunctiva/sclera: Conjunctivae normal.     Pupils: Pupils are equal, round, and reactive to light.  Neck:     Thyroid: No thyromegaly.     Vascular: No carotid bruit or JVD.  Cardiovascular:     Rate  and Rhythm: Normal rate and regular rhythm.     Heart sounds: Normal heart sounds.     No gallop.  Pulmonary:     Effort: Pulmonary effort is normal. No respiratory distress.     Breath sounds: Normal breath sounds. No wheezing or rales.  Abdominal:     General: There is no distension or abdominal bruit.     Palpations: Abdomen is soft. There is no mass.     Tenderness: There is no abdominal tenderness. There is no guarding or rebound.     Hernia: A hernia is present.     Comments: Umbilical hernia noted just above umbilicus  Is 5-6 cm in diameter and BS heard Is reducible without tenderness   Old scar noted without change    Musculoskeletal:     Cervical back: Normal range of motion and neck supple.     Right lower leg: No edema.     Left lower leg: No edema.  Lymphadenopathy:     Cervical: No cervical adenopathy.  Skin:    General: Skin is warm and dry.     Coloration: Skin is not pale.     Findings: No rash.  Neurological:     Mental Status: He is alert.     Coordination: Coordination normal.     Deep Tendon Reflexes: Reflexes are normal and symmetric. Reflexes normal.  Psychiatric:        Mood and Affect: Mood normal.           Assessment & Plan:   Problem List Items Addressed This Visit       Other   Umbilical hernia - Primary    This is getting larger Inst to avoid straining and also keep working on wt  loss ER precautions rev in detail (if inc pain/ tenderness/size)  Referred to gen surgery for eval/tx This is recurrent   Inst to update if sympt worsen or if he does not get call about appt in the next wk      Relevant Orders   Ambulatory referral to General Surgery

## 2022-08-24 NOTE — Patient Instructions (Signed)
Avoid heavy lifting/ straining if you can  Avoid straining to have a bowel movement as well   If you develop pain over hernia that worsens, does not improve or is severe go to the ER   If you cannot push it back in let us know   I did a surgical referral If you don't hear in 1-2 weeks- please let us know

## 2022-08-24 NOTE — Assessment & Plan Note (Signed)
This is getting larger Inst to avoid straining and also keep working on wt loss ER precautions rev in detail (if inc pain/ tenderness/size)  Referred to gen surgery for eval/tx This is recurrent   Inst to update if sympt worsen or if he does not get call about appt in the next wk

## 2022-08-25 ENCOUNTER — Encounter: Payer: Self-pay | Admitting: Physical Therapy

## 2022-08-25 ENCOUNTER — Ambulatory Visit (INDEPENDENT_AMBULATORY_CARE_PROVIDER_SITE_OTHER): Payer: 59 | Admitting: Physical Therapy

## 2022-08-25 DIAGNOSIS — R2689 Other abnormalities of gait and mobility: Secondary | ICD-10-CM

## 2022-08-25 DIAGNOSIS — R600 Localized edema: Secondary | ICD-10-CM

## 2022-08-25 DIAGNOSIS — M6281 Muscle weakness (generalized): Secondary | ICD-10-CM | POA: Diagnosis not present

## 2022-08-25 DIAGNOSIS — M25662 Stiffness of left knee, not elsewhere classified: Secondary | ICD-10-CM

## 2022-08-25 DIAGNOSIS — R2681 Unsteadiness on feet: Secondary | ICD-10-CM

## 2022-08-25 DIAGNOSIS — M25562 Pain in left knee: Secondary | ICD-10-CM

## 2022-08-25 NOTE — Therapy (Signed)
OUTPATIENT PHYSICAL THERAPY LOWER EXTREMITY TREATMENT   Patient Name: Sean Lee MRN: [id number] DOB:10-09-1960, 62 y.o., male Today's Date: 08/25/2022     END OF SESSION:  PT End of Session - 08/25/22 1205     Visit Number 14    Number of Visits 22    Date for PT Re-Evaluation 09/18/22    Authorization Type [employer]    Authorization Time Period 20% co-insurance    Authorization - Number of Visits 50    Progress Note Due on Visit 30    PT Start Time 1146    PT Stop Time 1230    PT Time Calculation (min) 44 min    Activity Tolerance Patient tolerated treatment well    Behavior During Therapy WFL for tasks assessed/performed                     Past Medical History:  Diagnosis Date   Contact dermatitis and other eczema due to plants (except food)    H/O: CVA (cardiovascular accident)    Carotid doppler neg (4/08)   Hematuria    Obesity, unspecified    Other and unspecified hyperlipidemia    Hx - no medication/diet controll and weight loss   Sacroiliitis, not elsewhere classified ([place])    Sleep apnea    uses CPAP but not every night   Unspecified essential hypertension    Varicose veins of left lower extremity    Past Surgical History:  Procedure Laterality Date   Carotid dopplers  11/16/2006   Negative   HERNIA REPAIR     umbilical   TOTAL KNEE ARTHROPLASTY Left 06/18/2022   TOTAL KNEE ARTHROPLASTY Left 06/18/2022   Procedure: LEFT TOTAL KNEE ARTHROPLASTY;  Surgeon: Leandrew Koyanagi, MD;  Location: [facility];  Service: Orthopedics;  Laterality: Left;   Patient Active Problem List   Diagnosis Date Noted   Status post total left knee replacement 06/18/2022   Primary osteoarthritis of left knee 01/01/2022   Primary osteoarthritis of right knee 01/01/2022   Left knee pain 51/76/1607   Umbilical hernia 37/05/6268   ED (erectile dysfunction) 12/31/2020   Insomnia 04/02/2020   Right ankle swelling 07/25/2019   Gout 11/03/2018   Chronic pain of right ankle  08/31/2018   Routine general medical examination at a health care facility 11/01/2017   Prostate cancer screening 06/28/2017   Alopecia 11/04/2016   Back pain 05/06/2016   Sleep disorder 08/28/2015   Colon cancer screening 08/28/2015   Prediabetes 01/02/2013   Varicosities of leg 03/21/2012   Rectus diastasis of lower abdomen 03/21/2012   Obstructive sleep apnea 10/15/2009   Class 2 severe obesity due to excess calories with serious comorbidity and body mass index (BMI) of 38.0 to 38.9 in adult [facility]) 09/02/2009   Hyperlipidemia 02/02/2007   HEMATURIA, MICROSCOPIC 02/02/2007   Essential hypertension 02/01/2007   CEREBROVASCULAR ACCIDENT, HX OF 02/01/2007    PCP: Abner Greenspan, MD  REFERRING PROVIDER: Leandrew Koyanagi, MD  REFERRING DIAG: [phone] (ICD-10-CM) - Status post total left knee replacement   THERAPY DIAG:  Stiffness of left knee, not elsewhere classified  Acute pain of left knee  Muscle weakness (generalized)  Unsteadiness on feet  Other abnormalities of gait and mobility  Localized edema  Rationale for Evaluation and Treatment: Rehabilitation  ONSET DATE: 06/18/2022 Left TKA  SUBJECTIVE:   SUBJECTIVE STATEMENT: Pt arriving today reporting 3/10 pain in his left knee. Pt stating he may have over did it yesterday. Pt stating he  purchased a stretch strap and has been using it more.   PERTINENT HISTORY: OA, back pain, obesity, HTN, CVA, contact dermatitis  PAIN:  Are you having pain? 3/10 Pain location: L knee in general  Pain description: tight "like something is pulling on it", still numb too  Aggravating factors: bending, turning over in sleep Relieving factors: meds, ice, elevation, pillows between legs during sleep, CPM   PRECAUTIONS: None  WEIGHT BEARING RESTRICTIONS: No  FALLS:  Has patient fallen in last 6 months? No  LIVING ENVIRONMENT: Lives with: lives with their spouse and adult niece Lives in: House Stairs: Yes: Internal: 14 steps; on left  going up and External: 4 steps; on right going up  bathroom & bedroom main level, his fish aquarium is upstairs. 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  PATIENT GOALS:  return to work, household activities.  NEXT MD VISIT: 07/31/2022  OBJECTIVE:     DIAGNOSTIC FINDINGS: 06/18/2022 - Postsurgical changes from right knee arthroplasty. Evidence of periprosthetic fracture. No loosening. Overlying external material limits the ability to assess for soft tissue abnormality. Within this limitation no focal abnormality is visualized.  PATIENT SURVEYS:  EVAL: FOTO intake: 61%   predicted:  70% 08/18/2022: FOTO visit 11 72%  EDEMA:  RLE: above knee 47.5cm  around knee 45cm  below knee 42cm LLE: above knee 56.3cm  around knee 55cm below knee 44cm  POSTURE: rounded shoulders, forward head, flexed trunk , and weight shift right  PALPATION: Tenderness along joint line & scar. Steri-strips in place. Small scabs still present on incision.   LOWER EXTREMITY ROM:   ROM Left eval Left  07/15/22 Left 07/20/22 Left 07/27/22 Left 07/30/22 Left 08/05/22 Left 08/18/22 Left 08/21/22 Left 08/25/22  Knee flexion Supine  P: 94* Seated P:111* A: 102* Seated P:111* A: 103* Seated P:113* A: 103* Seated P:120* A: 107*   Supine:  A: 118 P: 122 Supine A: 118  Knee extension Seated: LAQ A: -18* Supine  P: -6* A: quad set -9* Seated: LAQ A: -9* Supine  P: -3* A: quad set -4* Seated: LAQ A: -9* Supine  P: -3* A: quad set -4* Seated: LAQ A: -9* Standing A: TKE -3* Seated A: -4* Standing A: 0* Seated A: 0* LAQ Seated A: 0* LAQ Supine A: 0 Supine A:    (Blank rows = not tested)  LOWER EXTREMITY MMT:  MMT Left eval Right 1214/23 Left 07/30/22  Knee flexion 3-/5 HH dynameter 55.2# & 53.8# HH dynameter 30.2# & 27.4# LLE:RLE 52.8%   Knee extension 3-/5 HH dynameter 69.3# & 68.7# HH dynameter 44.1# & 43.1# LLE:RLE 63.2%   (Blank rows = not tested)  FUNCTIONAL TESTS:  18 inch chair transfer: minA using BUEs on armrests to RW  GAIT: Distance walked: 80' Assistive device utilized: Environmental consultant - 2 wheeled Level of assistance: SBA Comments: excessive weight bearing BUEs and PWB on LLE   TODAY'S TREATMENT                                                                           DATE:  08/25/2022: Therapeutic Exercise: Recumbent bike: Level 3, x 8 minutes, full revolutions Leg Press: 125# bil LE's x 20, Left LE only: 75# x 20 Step ups on 8 inch step x 20 leading with left LE no UE support Side stepping 30 feet x 2 each direction TRX  2 x 10 Neuromuscular Re-edu:  SLS on Airex: Left LE x 30 seconds Airex beam x 6  SLS on level ground with eyes closed with finger tip touch on parallel bars  SLS on level ground holding 30 seconds eyes open Vectors standing on left LE with sliding disc toward 3 cones    08/21/2022: Therapeutic Exercise: Recumbent bike: Level 3, x 8 minutes, full revolutions Leg Press: 125# bil LE's x 20, Left LE only: 75# x 20 Step ups on 8 inch step x 20 leading with left LE no UE support Knee ext machine 35# BLEs lifting, Left LE only lowering 2 x 10  Knee flex machine 45# BLEs 15 reps; 20# 10 reps single leg LLE 3 sets & RLE 2 sets;  Pt instructed pt in set-up & recommendations for [facility]. Pt verbalized understanding. TRX  2 x 10 Walking 55# back and forth with core activation x 10  Side stepping x 10 holding 35# (5 times each direction)      08/18/2022: Therapeutic Exercise: Precor seat 10 level 1 for 3 min & level 3 for 5 min with BLEs full revolution Knee ext machine 35# BLEs 15 reps; 15# 10 reps single leg LLE 3 sets & RLE 2 sets;  Pt instructed pt in set-up & recommendations for [facility]. Pt verbalized understanding. Knee flex machine 45# BLEs 15 reps; 20# 10 reps single leg LLE 3 sets & RLE 2  sets;  Pt instructed pt in set-up & recommendations for [facility]. Pt verbalized understanding. Squats with PT demo & verbal cues on technique. 3 reps 20# kettle bell with 90* turn to right reaching to overhead shelf and 3 reps 20# kettle bell with 90* turn to left reaching to overhead shelf Walking forward/backward with pulley wts 50# (25# per stack) 5 reps with wt posterior & 5 reps wt anterior to build push/pull force for work. Squat lift with pulley low setting 55# 10 reps.   Last 3 exercises working towards functional strength for work.  Pt verbalized understanding for building force & endurance.    Neuromuscular Re-ed: Braiding 8' X 3 reps without UE support. Tandem gait forward & backward 8" X 2 reps without UE support      HOME EXERCISE PROGRAM: Access Code: [email or web address] URL: [email or web address] Date: 07/20/2022 Prepared by: Jamey Reas  Exercises - Ankle Alphabet in Elevation  - 2-4 x daily - 7 x weekly - 1 sets - 1 reps - Quad Setting and Stretching  - 2-4 x daily - 7 x weekly - 5-10 sets - 10 reps - prop 5-10 minutes & quad set5 seconds hold - Supine Leg Press  - 2-4 x daily - 7 x weekly - 5-10 sets - 10 reps - 5 seconds hold - Supine Heel Slide with Strap  - 2-3 x daily - 7 x weekly - 2-3 sets - 10 reps - 5 seconds hold - Supine Straight Leg Raises  - 2-3 x daily - 7 x weekly - 2-3 sets - 10 reps - 5 seconds hold - Seated Knee Flexion Extension AROM   - 2-4 x daily - 7 x weekly - 2-3 sets - 10 reps - 5 seconds hold - Seated straight leg lifts  -  2-3 x daily - 7 x weekly - 2-3 sets - 10 reps - 5 seconds hold - Seated Hamstring Stretch with Strap  - 2-4 x daily - 7 x weekly - 1 sets - 3 reps - 20-30 seconds hold - Seated Passive Knee Extension with Weight  - 1-3 x daily - 7 x weekly - 1 sets - 2-3 reps - 1-2 minutes hold - Seated Hamstring Stretch with Chair  - 1-3 x daily - 7 x weekly - 1 sets - 2-3 reps - 20-30 seconds hold - Gastroc Stretch on Step  - 1-3 x daily  - 7 x weekly - 1 sets - 2-3 reps - 20-30 seconds hold - Supine Quadriceps Stretch with Strap on Table  - 1-3 x daily - 7 x weekly - 1 sets - 2-3 reps - 20-30 seconds hold  ASSESSMENT: CLINICAL IMPRESSION: Pt making progress with overall function per pt report. Pt's active knee flexion is 118 degrees. Treatment focusing on LE strengthening and dynamic balance. Continue skilled PT toward LTG's set.      OBJECTIVE IMPAIRMENTS: Abnormal gait, decreased activity tolerance, decreased balance, decreased endurance, decreased knowledge of condition, decreased knowledge of use of DME, decreased mobility, difficulty walking, decreased ROM, decreased strength, increased edema, increased muscle spasms, impaired flexibility, obesity, and pain.   ACTIVITY LIMITATIONS: carrying, lifting, bending, sitting, standing, squatting, sleeping, stairs, transfers, bed mobility, and locomotion level  PARTICIPATION LIMITATIONS: meal prep, cleaning, community activity, occupation, and yard work  PERSONAL FACTORS: 3+ comorbidities: see PMH  are also affecting patient's functional outcome.   REHAB POTENTIAL: Good  CLINICAL DECISION MAKING: Stable/uncomplicated  EVALUATION COMPLEXITY: Low   GOALS: Goals reviewed with patient? Yes  SHORT TERM GOALS: (target date 08/07/2022)   1.  Patient will demonstrate independent use of home exercise program to maintain progress from in clinic treatments. Goal status: MET 08/05/22  2. Patient reports 25% improvement in left knee pain.  Goal Status:MET 08/05/22  3. Left knee PROM -3* ext to 100* flex  Goal Status:MET 08/05/22  LONG TERM GOALS: (target dates for all long term goals are 10 weeks  09/18/2021 )   1. Patient will demonstrate/report pain at worst less than or equal to 2/10 to facilitate minimal limitation in daily activity secondary to pain symptoms.  Goal status: Ongoing 08/18/2022   2. Patient will demonstrate independent use of home exercise program to  facilitate ability to maintain/progress functional gains from skilled physical therapy services.  Goal status: Ongoing 08/18/2022   3. Patient will demonstrate FOTO outcome > or = 70 % to indicate reduced disability due to condition.  Goal status:  MET 08/18/2022   4.  Patient will demonstrate left LE MMT 5/5 throughout to faciltiate usual transfers, stairs, squatting at [facility] for daily life.   Goal status: Ongoing 08/18/2022   5.  Patient will demonstrate ability to perform work task of lifting & pushing weighted cart.  Goal status: Ongoing 08/18/2022   6.  patient ambulates >500', negotiates ramps, curbs & stairs single rail independent without assistive device.  Goal status: Ongoing 08/18/2022   7.  Left knee AROM 0* ext standing and 110* flexion seated. Goal Status: Ongoing 08/18/2022   PLAN:  PT FREQUENCY: 2-3x/week  PT DURATION: 10 weeks  PLANNED INTERVENTIONS: Therapeutic exercises, Therapeutic activity, Neuro Muscular re-education, Balance training, Gait training, Patient/Family education, Joint mobilization, Stair training, DME instructions, Dry Needling, Electrical stimulation, Traction, Cryotherapy, vasopneumatic device, Moist heat, Taping, Ultrasound, Ionotophoresis '4mg'$ /ml Dexamethasone, and Manual therapy.  All included  unless contraindicated  PLAN FOR NEXT SESSION:   Strengthening exercises & Progress to standing functional exercises including some work simulation. Standing balance.     Oretha Caprice, PT, MPT 08/25/2022, 12:34 PM

## 2022-08-26 ENCOUNTER — Encounter: Payer: Self-pay | Admitting: Physical Therapy

## 2022-08-26 ENCOUNTER — Ambulatory Visit (INDEPENDENT_AMBULATORY_CARE_PROVIDER_SITE_OTHER): Payer: 59 | Admitting: Physical Therapy

## 2022-08-26 DIAGNOSIS — M6281 Muscle weakness (generalized): Secondary | ICD-10-CM | POA: Diagnosis not present

## 2022-08-26 DIAGNOSIS — M25662 Stiffness of left knee, not elsewhere classified: Secondary | ICD-10-CM | POA: Diagnosis not present

## 2022-08-26 DIAGNOSIS — M25562 Pain in left knee: Secondary | ICD-10-CM

## 2022-08-26 DIAGNOSIS — R600 Localized edema: Secondary | ICD-10-CM

## 2022-08-26 DIAGNOSIS — R2689 Other abnormalities of gait and mobility: Secondary | ICD-10-CM

## 2022-08-26 DIAGNOSIS — R2681 Unsteadiness on feet: Secondary | ICD-10-CM | POA: Diagnosis not present

## 2022-08-26 NOTE — Therapy (Signed)
OUTPATIENT PHYSICAL THERAPY LOWER EXTREMITY TREATMENT   Patient Name: Sean Lee MRN: [id number] DOB:1960-08-28, 62 y.o., male Today's Date: 08/26/2022     END OF SESSION:  PT End of Session - 08/26/22 0847     Visit Number 15    Number of Visits 22    Date for PT Re-Evaluation 09/18/22    Authorization Type [employer]    Authorization Time Period 20% co-insurance    Authorization - Visit Number 10    Authorization - Number of Visits 50    Progress Note Due on Visit 52    PT Start Time 0800    PT Stop Time 0845    PT Time Calculation (min) 45 min    Activity Tolerance Patient tolerated treatment well    Behavior During Therapy WFL for tasks assessed/performed                      Past Medical History:  Diagnosis Date   Contact dermatitis and other eczema due to plants (except food)    H/O: CVA (cardiovascular accident)    Carotid doppler neg (4/08)   Hematuria    Obesity, unspecified    Other and unspecified hyperlipidemia    Hx - no medication/diet controll and weight loss   Sacroiliitis, not elsewhere classified ([place])    Sleep apnea    uses CPAP but not every night   Unspecified essential hypertension    Varicose veins of left lower extremity    Past Surgical History:  Procedure Laterality Date   Carotid dopplers  11/16/2006   Negative   HERNIA REPAIR     umbilical   TOTAL KNEE ARTHROPLASTY Left 06/18/2022   TOTAL KNEE ARTHROPLASTY Left 06/18/2022   Procedure: LEFT TOTAL KNEE ARTHROPLASTY;  Surgeon: Leandrew Koyanagi, MD;  Location: [place];  Service: Orthopedics;  Laterality: Left;   Patient Active Problem List   Diagnosis Date Noted   Status post total left knee replacement 06/18/2022   Primary osteoarthritis of left knee 01/01/2022   Primary osteoarthritis of right knee 01/01/2022   Left knee pain 96/78/9381   Umbilical hernia 01/75/1025   ED (erectile dysfunction) 12/31/2020   Insomnia 04/02/2020   Right ankle swelling 07/25/2019   Gout  11/03/2018   Chronic pain of right ankle 08/31/2018   Routine general medical examination at a health care facility 11/01/2017   Prostate cancer screening 06/28/2017   Alopecia 11/04/2016   Back pain 05/06/2016   Sleep disorder 08/28/2015   Colon cancer screening 08/28/2015   Prediabetes 01/02/2013   Varicosities of leg 03/21/2012   Rectus diastasis of lower abdomen 03/21/2012   Obstructive sleep apnea 10/15/2009   Class 2 severe obesity due to excess calories with serious comorbidity and body mass index (BMI) of 38.0 to 38.9 in adult [facility]) 09/02/2009   Hyperlipidemia 02/02/2007   HEMATURIA, MICROSCOPIC 02/02/2007   Essential hypertension 02/01/2007   CEREBROVASCULAR ACCIDENT, HX OF 02/01/2007    PCP: Abner Greenspan, MD  REFERRING PROVIDER: Leandrew Koyanagi, MD  REFERRING DIAG: [phone] (ICD-10-CM) - Status post total left knee replacement   THERAPY DIAG:  Stiffness of left knee, not elsewhere classified  Acute pain of left knee  Muscle weakness (generalized)  Unsteadiness on feet  Other abnormalities of gait and mobility  Localized edema  Rationale for Evaluation and Treatment: Rehabilitation  ONSET DATE: 06/18/2022 Left TKA  SUBJECTIVE:   SUBJECTIVE STATEMENT: No pain upon arrival today.   PERTINENT HISTORY: OA, back pain, obesity,  HTN, CVA, contact dermatitis  PAIN:  Are you having pain?  No,  Pain location: L knee in general  Pain description: tight "like something is pulling on it", still numb too  Aggravating factors: bending, turning over in sleep Relieving factors: meds, ice, elevation, pillows between legs during sleep, CPM   PRECAUTIONS: None  WEIGHT BEARING RESTRICTIONS: No  FALLS:  Has patient fallen in last 6 months? No  LIVING ENVIRONMENT: Lives with: lives with their spouse and adult niece Lives in: House Stairs: Yes: Internal: 14 steps; on left going up and External: 4 steps; on right going up  bathroom & bedroom main level, his fish  aquarium is upstairs. 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  PATIENT GOALS:  return to work, household activities.  NEXT MD VISIT: 07/31/2022  OBJECTIVE:     DIAGNOSTIC FINDINGS: 06/18/2022 - Postsurgical changes from right knee arthroplasty. Evidence of periprosthetic fracture. No loosening. Overlying external material limits the ability to assess for soft tissue abnormality. Within this limitation no focal abnormality is visualized.  PATIENT SURVEYS:  EVAL: FOTO intake: 61%   predicted:  70% 08/18/2022: FOTO visit 11 72%  EDEMA:  RLE: above knee 47.5cm  around knee 45cm  below knee 42cm LLE: above knee 56.3cm  around knee 55cm below knee 44cm  POSTURE: rounded shoulders, forward head, flexed trunk , and weight shift right  PALPATION: Tenderness along joint line & scar. Steri-strips in place. Small scabs still present on incision.   LOWER EXTREMITY ROM:   ROM Left eval Left  07/15/22 Left 07/20/22 Left 07/27/22 Left 07/30/22 Left 08/05/22 Left 08/18/22 Left 08/21/22 Left 08/25/22  Knee flexion Supine  P: 94* Seated P:111* A: 102* Seated P:111* A: 103* Seated P:113* A: 103* Seated P:120* A: 107*   Supine:  A: 118 P: 122 Supine A: 118  Knee extension Seated: LAQ A: -18* Supine  P: -6* A: quad set -9* Seated: LAQ A: -9* Supine  P: -3* A: quad set -4* Seated: LAQ A: -9* Supine  P: -3* A: quad set -4* Seated: LAQ A: -9* Standing A: TKE -3* Seated A: -4* Standing A: 0* Seated A: 0* LAQ Seated A: 0* LAQ Supine A: 0 Supine A:    (Blank rows = not tested)  LOWER EXTREMITY MMT:  MMT Left eval Right 1214/23 Left 07/30/22  Knee flexion 3-/5 HH dynameter 55.2# & 53.8# HH dynameter 30.2# & 27.4# LLE:RLE 52.8%  Knee extension 3-/5 HH dynameter 69.3# & 68.7# HH dynameter 44.1# & 43.1# LLE:RLE 63.2%    (Blank rows = not tested)  FUNCTIONAL TESTS:  18 inch chair transfer: minA using BUEs on armrests to RW  GAIT: Distance walked: 80' Assistive device utilized: Environmental consultant - 2 wheeled Level of assistance: SBA Comments: excessive weight bearing BUEs and PWB on LLE   TODAY'S TREATMENT                                                                           DATE:  08/26/2022: Therapeutic Exercise: Recumbent bike: Level 3, x 8 minutes, full revolutions Leg Press: 131#  bil LE's x 20, Left LE only: 81# x 20 Side stepping 30 feet x 2 each direction c Level 3 band around knees TRX  2 x 10 Neuromuscular Re-edu:  Stepping over and back on 4 inch step x 15 with Left LE planted on top of step Rocker board: x 1 minute df/pf and inver/eversion Vectors standing on left LE with sliding disc toward 3 cones Side stepping over 6 cones x 2 each direction Cone tapping 2 x 10 crossing midline with 2 cones places in front of pt    08/25/2022: Therapeutic Exercise: Recumbent bike: Level 3, x 8 minutes, full revolutions Leg Press: 125# bil LE's x 20, Left LE only: 75# x 20 Step ups on 8 inch step x 20 leading with left LE no UE support Side stepping 30 feet x 2 each direction TRX  2 x 10 Neuromuscular Re-edu:  SLS on Airex: Left LE x 30 seconds Airex beam x 6  SLS on level ground with eyes closed with finger tip touch on parallel bars  SLS on level ground holding 30 seconds eyes open Vectors standing on left LE with sliding disc toward 3 cones    08/21/2022: Therapeutic Exercise: Recumbent bike: Level 3, x 8 minutes, full revolutions Leg Press: 125# bil LE's x 20, Left LE only: 75# x 20 Step ups on 8 inch step x 20 leading with left LE no UE support Knee ext machine 35# BLEs lifting, Left LE only lowering 2 x 10  Knee flex machine 45# BLEs 15 reps; 20# 10 reps single leg LLE 3 sets & RLE 2 sets;  Pt instructed pt in set-up & recommendations for [facility]. Pt verbalized understanding. TRX  2 x 10 Walking  55# back and forth with core activation x 10  Side stepping x 10 holding 35# (5 times each direction)      08/18/2022: Therapeutic Exercise: Precor seat 10 level 1 for 3 min & level 3 for 5 min with BLEs full revolution Knee ext machine 35# BLEs 15 reps; 15# 10 reps single leg LLE 3 sets & RLE 2 sets;  Pt instructed pt in set-up & recommendations for [facility]. Pt verbalized understanding. Knee flex machine 45# BLEs 15 reps; 20# 10 reps single leg LLE 3 sets & RLE 2 sets;  Pt instructed pt in set-up & recommendations for [facility]. Pt verbalized understanding. Squats with PT demo & verbal cues on technique. 3 reps 20# kettle bell with 90* turn to right reaching to overhead shelf and 3 reps 20# kettle bell with 90* turn to left reaching to overhead shelf Walking forward/backward with pulley wts 50# (25# per stack) 5 reps with wt posterior & 5 reps wt anterior to build push/pull force for work. Squat lift with pulley low setting 55# 10 reps.   Last 3 exercises working towards functional strength for work.  Pt verbalized understanding for building force & endurance.    Neuromuscular Re-ed: Braiding 8' X 3 reps without UE support. Tandem gait forward & backward 8" X 2 reps without UE support      HOME EXERCISE PROGRAM: Access Code: [email or web address] URL: [email or web address] Date: 07/20/2022 Prepared by: Jamey Reas  Exercises - Ankle Alphabet in Elevation  - 2-4 x daily - 7 x weekly - 1 sets - 1 reps - Quad Setting and Stretching  - 2-4 x daily - 7 x weekly - 5-10 sets - 10 reps - prop 5-10 minutes & quad set5 seconds hold - Supine Leg Press  - 2-4  x daily - 7 x weekly - 5-10 sets - 10 reps - 5 seconds hold - Supine Heel Slide with Strap  - 2-3 x daily - 7 x weekly - 2-3 sets - 10 reps - 5 seconds hold - Supine Straight Leg Raises  - 2-3 x daily - 7 x weekly - 2-3 sets - 10 reps - 5 seconds hold - Seated Knee Flexion Extension AROM   - 2-4 x daily - 7 x weekly - 2-3 sets - 10 reps - 5  seconds hold - Seated straight leg lifts  - 2-3 x daily - 7 x weekly - 2-3 sets - 10 reps - 5 seconds hold - Seated Hamstring Stretch with Strap  - 2-4 x daily - 7 x weekly - 1 sets - 3 reps - 20-30 seconds hold - Seated Passive Knee Extension with Weight  - 1-3 x daily - 7 x weekly - 1 sets - 2-3 reps - 1-2 minutes hold - Seated Hamstring Stretch with Chair  - 1-3 x daily - 7 x weekly - 1 sets - 2-3 reps - 20-30 seconds hold - Gastroc Stretch on Step  - 1-3 x daily - 7 x weekly - 1 sets - 2-3 reps - 20-30 seconds hold - Supine Quadriceps Stretch with Strap on Table  - 1-3 x daily - 7 x weekly - 1 sets - 2-3 reps - 20-30 seconds hold  ASSESSMENT: CLINICAL IMPRESSION: Pt arriving today with no pain reported after working with PT yesterday. Pt progressing with LE strengthening and dynamic balance activities. Continue skilled PT to maximize pt's function.      OBJECTIVE IMPAIRMENTS: Abnormal gait, decreased activity tolerance, decreased balance, decreased endurance, decreased knowledge of condition, decreased knowledge of use of DME, decreased mobility, difficulty walking, decreased ROM, decreased strength, increased edema, increased muscle spasms, impaired flexibility, obesity, and pain.   ACTIVITY LIMITATIONS: carrying, lifting, bending, sitting, standing, squatting, sleeping, stairs, transfers, bed mobility, and locomotion level  PARTICIPATION LIMITATIONS: meal prep, cleaning, community activity, occupation, and yard work  PERSONAL FACTORS: 3+ comorbidities: see PMH  are also affecting patient's functional outcome.   REHAB POTENTIAL: Good  CLINICAL DECISION MAKING: Stable/uncomplicated  EVALUATION COMPLEXITY: Low   GOALS: Goals reviewed with patient? Yes  SHORT TERM GOALS: (target date 08/07/2022)   1.  Patient will demonstrate independent use of home exercise program to maintain progress from in clinic treatments. Goal status: MET 08/05/22  2. Patient reports 25% improvement in  left knee pain.  Goal Status:MET 08/05/22  3. Left knee PROM -3* ext to 100* flex  Goal Status:MET 08/05/22  LONG TERM GOALS: (target dates for all long term goals are 10 weeks  09/18/2021 )   1. Patient will demonstrate/report pain at worst less than or equal to 2/10 to facilitate minimal limitation in daily activity secondary to pain symptoms.  Goal status: Ongoing 08/18/2022   2. Patient will demonstrate independent use of home exercise program to facilitate ability to maintain/progress functional gains from skilled physical therapy services.  Goal status: Ongoing 08/18/2022   3. Patient will demonstrate FOTO outcome > or = 70 % to indicate reduced disability due to condition.  Goal status:  MET 08/18/2022   4.  Patient will demonstrate left LE MMT 5/5 throughout to faciltiate usual transfers, stairs, squatting at [facility] for daily life.   Goal status: Ongoing 08/18/2022   5.  Patient will demonstrate ability to perform work task of lifting & pushing weighted cart.  Goal status: Ongoing 08/18/2022  6.  patient ambulates >500', negotiates ramps, curbs & stairs single rail independent without assistive device.  Goal status: Ongoing 08/18/2022   7.  Left knee AROM 0* ext standing and 110* flexion seated. Goal Status: Ongoing 08/18/2022   PLAN:  PT FREQUENCY: 2-3x/week  PT DURATION: 10 weeks  PLANNED INTERVENTIONS: Therapeutic exercises, Therapeutic activity, Neuro Muscular re-education, Balance training, Gait training, Patient/Family education, Joint mobilization, Stair training, DME instructions, Dry Needling, Electrical stimulation, Traction, Cryotherapy, vasopneumatic device, Moist heat, Taping, Ultrasound, Ionotophoresis '4mg'$ /ml Dexamethasone, and Manual therapy.  All included unless contraindicated  PLAN FOR NEXT SESSION:   Strengthening exercises & Progress to standing functional exercises including some work simulation. Standing balance.     Oretha Caprice, PT, MPT 08/26/2022,  8:53 AM

## 2022-08-27 ENCOUNTER — Telehealth: Payer: Self-pay | Admitting: Orthopaedic Surgery

## 2022-08-27 NOTE — Telephone Encounter (Signed)
Called and notified [facility] that patient needs to be premedicated before dental procedure.

## 2022-08-27 NOTE — Telephone Encounter (Signed)
Patient is at dentist off and they need confirmation he is not on any antibiotics--please call ASAP [phone]( [facility])

## 2022-09-01 ENCOUNTER — Encounter: Payer: Self-pay | Admitting: Physical Therapy

## 2022-09-01 ENCOUNTER — Ambulatory Visit (INDEPENDENT_AMBULATORY_CARE_PROVIDER_SITE_OTHER): Payer: 59 | Admitting: Physical Therapy

## 2022-09-01 DIAGNOSIS — M25662 Stiffness of left knee, not elsewhere classified: Secondary | ICD-10-CM

## 2022-09-01 DIAGNOSIS — M6281 Muscle weakness (generalized): Secondary | ICD-10-CM | POA: Diagnosis not present

## 2022-09-01 DIAGNOSIS — R600 Localized edema: Secondary | ICD-10-CM

## 2022-09-01 DIAGNOSIS — M25562 Pain in left knee: Secondary | ICD-10-CM

## 2022-09-01 DIAGNOSIS — R2681 Unsteadiness on feet: Secondary | ICD-10-CM | POA: Diagnosis not present

## 2022-09-01 DIAGNOSIS — R2689 Other abnormalities of gait and mobility: Secondary | ICD-10-CM

## 2022-09-01 NOTE — Therapy (Signed)
OUTPATIENT PHYSICAL THERAPY LOWER EXTREMITY TREATMENT   Patient Name: Sean Lee MRN: [id number] DOB:October 29, 1960, 62 y.o., male Today's Date: 09/01/2022     END OF SESSION:  PT End of Session - 09/01/22 0844     Visit Number 16    Number of Visits 22    Date for PT Re-Evaluation 09/18/22    Authorization Type [employer]    Authorization Time Period 20% co-insurance    Authorization - Number of Visits 50    Progress Note Due on Visit 72    PT Start Time 0844    PT Stop Time 0927    PT Time Calculation (min) 43 min    Activity Tolerance Patient tolerated treatment well    Behavior During Therapy WFL for tasks assessed/performed                       Past Medical History:  Diagnosis Date   Contact dermatitis and other eczema due to plants (except food)    H/O: CVA (cardiovascular accident)    Carotid doppler neg (4/08)   Hematuria    Obesity, unspecified    Other and unspecified hyperlipidemia    Hx - no medication/diet controll and weight loss   Sacroiliitis, not elsewhere classified ([facility])    Sleep apnea    uses CPAP but not every night   Unspecified essential hypertension    Varicose veins of left lower extremity    Past Surgical History:  Procedure Laterality Date   Carotid dopplers  11/16/2006   Negative   HERNIA REPAIR     umbilical   TOTAL KNEE ARTHROPLASTY Left 06/18/2022   TOTAL KNEE ARTHROPLASTY Left 06/18/2022   Procedure: LEFT TOTAL KNEE ARTHROPLASTY;  Surgeon: Leandrew Koyanagi, MD;  Location: [facility];  Service: Orthopedics;  Laterality: Left;   Patient Active Problem List   Diagnosis Date Noted   Status post total left knee replacement 06/18/2022   Primary osteoarthritis of left knee 01/01/2022   Primary osteoarthritis of right knee 01/01/2022   Left knee pain 67/34/1937   Umbilical hernia 90/24/0973   ED (erectile dysfunction) 12/31/2020   Insomnia 04/02/2020   Right ankle swelling 07/25/2019   Gout 11/03/2018   Chronic pain of right ankle  08/31/2018   Routine general medical examination at a health care facility 11/01/2017   Prostate cancer screening 06/28/2017   Alopecia 11/04/2016   Back pain 05/06/2016   Sleep disorder 08/28/2015   Colon cancer screening 08/28/2015   Prediabetes 01/02/2013   Varicosities of leg 03/21/2012   Rectus diastasis of lower abdomen 03/21/2012   Obstructive sleep apnea 10/15/2009   Class 2 severe obesity due to excess calories with serious comorbidity and body mass index (BMI) of 38.0 to 38.9 in adult [facility]) 09/02/2009   Hyperlipidemia 02/02/2007   HEMATURIA, MICROSCOPIC 02/02/2007   Essential hypertension 02/01/2007   CEREBROVASCULAR ACCIDENT, HX OF 02/01/2007    PCP: Abner Greenspan, MD  REFERRING PROVIDER: Leandrew Koyanagi, MD  REFERRING DIAG: [phone] (ICD-10-CM) - Status post total left knee replacement   THERAPY DIAG:  Stiffness of left knee, not elsewhere classified  Acute pain of left knee  Muscle weakness (generalized)  Unsteadiness on feet  Other abnormalities of gait and mobility  Localized edema  Rationale for Evaluation and Treatment: Rehabilitation  ONSET DATE: 06/18/2022 Left TKA  SUBJECTIVE:   SUBJECTIVE STATEMENT: He has been going to [facility] 1-2x/wk.    PERTINENT HISTORY: OA, back pain, obesity, HTN, CVA, contact dermatitis  PAIN:  Are you having pain?  No,  Pain location: L knee in general  Pain description: tight "like something is pulling on it", still numb too  Aggravating factors: bending, turning over in sleep Relieving factors: meds, ice, elevation, pillows between legs during sleep, CPM   PRECAUTIONS: None  WEIGHT BEARING RESTRICTIONS: No  FALLS:  Has patient fallen in last 6 months? No  LIVING ENVIRONMENT: Lives with: lives with their spouse and adult niece Lives in: House Stairs: Yes: Internal: 14 steps; on left going up and External: 4 steps; on right going up  bathroom & bedroom main level, his fish aquarium is upstairs. 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  PATIENT GOALS:  return to work, household activities.  NEXT MD VISIT: 07/31/2022  OBJECTIVE:     DIAGNOSTIC FINDINGS: 06/18/2022 - Postsurgical changes from right knee arthroplasty. Evidence of periprosthetic fracture. No loosening. Overlying external material limits the ability to assess for soft tissue abnormality. Within this limitation no focal abnormality is visualized.  PATIENT SURVEYS:  EVAL: FOTO intake: 61%   predicted:  70% 08/18/2022: FOTO visit 11 72%  EDEMA:  RLE: above knee 47.5cm  around knee 45cm  below knee 42cm LLE: above knee 56.3cm  around knee 55cm below knee 44cm  POSTURE: rounded shoulders, forward head, flexed trunk , and weight shift right  PALPATION: Tenderness along joint line & scar. Steri-strips in place. Small scabs still present on incision.   LOWER EXTREMITY ROM:   ROM Left eval Left  07/15/22 Left 07/20/22 Left 07/27/22 Left 07/30/22 Left 08/05/22 Left 08/18/22 Left 08/21/22 Left 08/25/22  Knee flexion Supine  P: 94* Seated P:111* A: 102* Seated P:111* A: 103* Seated P:113* A: 103* Seated P:120* A: 107*   Supine:  A: 118 P: 122 Supine A: 118  Knee extension Seated: LAQ A: -18* Supine  P: -6* A: quad set -9* Seated: LAQ A: -9* Supine  P: -3* A: quad set -4* Seated: LAQ A: -9* Supine  P: -3* A: quad set -4* Seated: LAQ A: -9* Standing A: TKE -3* Seated A: -4* Standing A: 0* Seated A: 0* LAQ Seated A: 0* LAQ Supine A: 0 Supine A:    (Blank rows = not tested)  LOWER EXTREMITY MMT:  MMT Left eval Right 1214/23 Left 07/30/22  Knee flexion 3-/5 HH dynameter 55.2# & 53.8# HH dynameter 30.2# & 27.4# LLE:RLE 52.8%  Knee extension 3-/5 HH dynameter 69.3# & 68.7# HH dynameter 44.1# & 43.1# LLE:RLE 63.2%   (Blank rows = not  tested)  FUNCTIONAL TESTS:  18 inch chair transfer: minA using BUEs on armrests to RW  GAIT: Distance walked: 80' Assistive device utilized: Environmental consultant - 2 wheeled Level of assistance: SBA Comments: excessive weight bearing BUEs and PWB on LLE   TODAY'S TREATMENT                                                                           DATE:  09/01/2022: Therapeutic Exercise: Recumbent bike: Level 4, x 8 minutes, full revolutions Leg Press: 137# bil LE's x 15 reps:  marching for stance stabilization 125# 5 reps ea LE for 5 sets;  Left LE only: 81# x 15 Side stepping 30 feet x 1 each direction c Level 3 band around knees; 2nd set with squat then lateral step raising out of squat 30' ea direction  Therapeutic Activities: PT recommending setting timer on phone to go off each hour.  Stand as long as tolerated doing projects or exercise each hour during work hours to build stamina for return to work. Pt verbalized understanding.  PT educated pt on need for good shoes for support. Wearing old worn shoes (every day shoes & work shoes) can change kinetic chain of LEs and pain / issues. Pt verbalized understanding.   Neuromuscular Re-edu:  Rocker board round with one pivot to require control in other direction: x 1 minute ant/post and right/left Vectors standing on ea LE with sliding disc knee flex on outward motion & ext on inward motion toward 5 cones (ant, ant-lat, lat, post-lat & post) 5 reps ea LE Standing crossways on foam beam: initially static stance 30 sec; then eyes closed static 10 sec 3 reps; Cone tapping 2 x 10 crossing midline with 3 cones places in front of pt; squat 3 reps to pick up cones    08/26/2022: Therapeutic Exercise: Recumbent bike: Level 3, x 8 minutes, full revolutions Leg Press: 131# bil LE's x 20, Left LE only: 81# x 20 Side stepping 30 feet x 2 each direction c Level 3 band around knees TRX  2 x 10 Neuromuscular Re-edu:  Stepping over and back on 4 inch step x 15  with Left LE planted on top of step Rocker board: x 1 minute df/pf and inver/eversion Vectors standing on left LE with sliding disc toward 3 cones Side stepping over 6 cones x 2 each direction Cone tapping 2 x 10 crossing midline with 2 cones places in front of pt    08/25/2022: Therapeutic Exercise: Recumbent bike: Level 3, x 8 minutes, full revolutions Leg Press: 125# bil LE's x 20, Left LE only: 75# x 20 Step ups on 8 inch step x 20 leading with left LE no UE support Side stepping 30 feet x 2 each direction TRX  2 x 10 Neuromuscular Re-edu:  SLS on Airex: Left LE x 30 seconds Airex beam x 6  SLS on level ground with eyes closed with finger tip touch on parallel bars  SLS on level ground holding 30 seconds eyes open Vectors standing on left LE with sliding disc toward 3 cones   HOME EXERCISE PROGRAM: Access Code: [email or web address] URL: [email or web address] Date: 07/20/2022 Prepared by: Jamey Reas  Exercises - Ankle Alphabet in Elevation  - 2-4 x daily - 7 x weekly - 1 sets - 1 reps - Quad Setting and Stretching  - 2-4 x daily - 7 x weekly - 5-10 sets - 10 reps - prop 5-10 minutes & quad set5 seconds hold - Supine Leg Press  - 2-4 x daily - 7 x weekly - 5-10 sets - 10 reps - 5 seconds hold - Supine Heel Slide with Strap  - 2-3 x daily - 7 x weekly - 2-3 sets - 10 reps - 5 seconds hold - Supine Straight Leg Raises  - 2-3 x daily - 7 x weekly - 2-3 sets - 10 reps - 5 seconds hold - Seated Knee Flexion Extension AROM   - 2-4 x daily - 7 x weekly - 2-3 sets - 10 reps - 5 seconds hold - Seated  straight leg lifts  - 2-3 x daily - 7 x weekly - 2-3 sets - 10 reps - 5 seconds hold - Seated Hamstring Stretch with Strap  - 2-4 x daily - 7 x weekly - 1 sets - 3 reps - 20-30 seconds hold - Seated Passive Knee Extension with Weight  - 1-3 x daily - 7 x weekly - 1 sets - 2-3 reps - 1-2 minutes hold - Seated Hamstring Stretch with Chair  - 1-3 x daily - 7 x weekly - 1 sets - 2-3 reps -  20-30 seconds hold - Gastroc Stretch on Step  - 1-3 x daily - 7 x weekly - 1 sets - 2-3 reps - 20-30 seconds hold - Supine Quadriceps Stretch with Strap on Table  - 1-3 x daily - 7 x weekly - 1 sets - 2-3 reps - 20-30 seconds hold  ASSESSMENT: CLINICAL IMPRESSION: PT educated on standing program to increase tolerance for work which he appears to understand.  PT progressed standing activities & balance which was challenging but improved with repetition & education.  Pt continues to benefit from skilled PT to improve function & ability to return to work.  OBJECTIVE IMPAIRMENTS: Abnormal gait, decreased activity tolerance, decreased balance, decreased endurance, decreased knowledge of condition, decreased knowledge of use of DME, decreased mobility, difficulty walking, decreased ROM, decreased strength, increased edema, increased muscle spasms, impaired flexibility, obesity, and pain.   ACTIVITY LIMITATIONS: carrying, lifting, bending, sitting, standing, squatting, sleeping, stairs, transfers, bed mobility, and locomotion level  PARTICIPATION LIMITATIONS: meal prep, cleaning, community activity, occupation, and yard work  PERSONAL FACTORS: 3+ comorbidities: see PMH  are also affecting patient's functional outcome.   REHAB POTENTIAL: Good  CLINICAL DECISION MAKING: Stable/uncomplicated  EVALUATION COMPLEXITY: Low   GOALS: Goals reviewed with patient? Yes  SHORT TERM GOALS: (target date 08/07/2022)   1.  Patient will demonstrate independent use of home exercise program to maintain progress from in clinic treatments. Goal status: MET 08/05/22  2. Patient reports 25% improvement in left knee pain.  Goal Status:MET 08/05/22  3. Left knee PROM -3* ext to 100* flex  Goal Status:MET 08/05/22  LONG TERM GOALS: (target dates for all long term goals are 10 weeks  09/18/2021 )   1. Patient will demonstrate/report pain at worst less than or equal to 2/10 to facilitate minimal limitation in daily  activity secondary to pain symptoms.  Goal status: Ongoing 08/18/2022   2. Patient will demonstrate independent use of home exercise program to facilitate ability to maintain/progress functional gains from skilled physical therapy services.  Goal status: Ongoing 08/18/2022   3. Patient will demonstrate FOTO outcome > or = 70 % to indicate reduced disability due to condition.  Goal status:  MET 08/18/2022   4.  Patient will demonstrate left LE MMT 5/5 throughout to faciltiate usual transfers, stairs, squatting at [facility] for daily life.   Goal status: Ongoing 08/18/2022   5.  Patient will demonstrate ability to perform work task of lifting & pushing weighted cart.  Goal status: Ongoing 08/18/2022   6.  patient ambulates >500', negotiates ramps, curbs & stairs single rail independent without assistive device.  Goal status: Ongoing 08/18/2022   7.  Left knee AROM 0* ext standing and 110* flexion seated. Goal Status: Ongoing 08/18/2022   PLAN:  PT FREQUENCY: 2-3x/week  PT DURATION: 10 weeks  PLANNED INTERVENTIONS: Therapeutic exercises, Therapeutic activity, Neuro Muscular re-education, Balance training, Gait training, Patient/Family education, Joint mobilization, Stair training, DME instructions, Dry Needling,  Electrical stimulation, Traction, Cryotherapy, vasopneumatic device, Moist heat, Taping, Ultrasound, Ionotophoresis '4mg'$ /ml Dexamethasone, and Manual therapy.  All included unless contraindicated  PLAN FOR NEXT SESSION:   continue with progressive strengthening exercises including functional exercises & some work simulation. Standing balance.     Jamey Reas, PT, DPT 09/01/2022, 10:17 AM

## 2022-09-03 ENCOUNTER — Encounter: Payer: Self-pay | Admitting: Physical Therapy

## 2022-09-03 ENCOUNTER — Ambulatory Visit (INDEPENDENT_AMBULATORY_CARE_PROVIDER_SITE_OTHER): Payer: 59 | Admitting: Physical Therapy

## 2022-09-03 DIAGNOSIS — R2681 Unsteadiness on feet: Secondary | ICD-10-CM | POA: Diagnosis not present

## 2022-09-03 DIAGNOSIS — M25562 Pain in left knee: Secondary | ICD-10-CM | POA: Diagnosis not present

## 2022-09-03 DIAGNOSIS — M6281 Muscle weakness (generalized): Secondary | ICD-10-CM

## 2022-09-03 DIAGNOSIS — M25662 Stiffness of left knee, not elsewhere classified: Secondary | ICD-10-CM | POA: Diagnosis not present

## 2022-09-03 DIAGNOSIS — R600 Localized edema: Secondary | ICD-10-CM

## 2022-09-03 DIAGNOSIS — R2689 Other abnormalities of gait and mobility: Secondary | ICD-10-CM

## 2022-09-03 NOTE — Therapy (Signed)
OUTPATIENT PHYSICAL THERAPY LOWER EXTREMITY TREATMENT   Patient Name: Sean Lee MRN: [id number] DOB:04/30/1961, 62 y.o., male Today's Date: 09/03/2022     END OF SESSION:  PT End of Session - 09/03/22 0848     Visit Number 17    Number of Visits 22    Date for PT Re-Evaluation 09/18/22    Authorization Type [employer]    Authorization Time Period 20% co-insurance    Authorization - Number of Visits 50    Progress Note Due on Visit 64    PT Start Time 0849    PT Stop Time 0930    PT Time Calculation (min) 41 min    Activity Tolerance Patient tolerated treatment well    Behavior During Therapy WFL for tasks assessed/performed                       Past Medical History:  Diagnosis Date   Contact dermatitis and other eczema due to plants (except food)    H/O: CVA (cardiovascular accident)    Carotid doppler neg (4/08)   Hematuria    Obesity, unspecified    Other and unspecified hyperlipidemia    Hx - no medication/diet controll and weight loss   Sacroiliitis, not elsewhere classified ([place])    Sleep apnea    uses CPAP but not every night   Unspecified essential hypertension    Varicose veins of left lower extremity    Past Surgical History:  Procedure Laterality Date   Carotid dopplers  11/16/2006   Negative   HERNIA REPAIR     umbilical   TOTAL KNEE ARTHROPLASTY Left 06/18/2022   TOTAL KNEE ARTHROPLASTY Left 06/18/2022   Procedure: LEFT TOTAL KNEE ARTHROPLASTY;  Surgeon: Leandrew Koyanagi, MD;  Location: [facility];  Service: Orthopedics;  Laterality: Left;   Patient Active Problem List   Diagnosis Date Noted   Status post total left knee replacement 06/18/2022   Primary osteoarthritis of left knee 01/01/2022   Primary osteoarthritis of right knee 01/01/2022   Left knee pain 21/97/5883   Umbilical hernia 25/49/8264   ED (erectile dysfunction) 12/31/2020   Insomnia 04/02/2020   Right ankle swelling 07/25/2019   Gout 11/03/2018   Chronic pain of right ankle  08/31/2018   Routine general medical examination at a health care facility 11/01/2017   Prostate cancer screening 06/28/2017   Alopecia 11/04/2016   Back pain 05/06/2016   Sleep disorder 08/28/2015   Colon cancer screening 08/28/2015   Prediabetes 01/02/2013   Varicosities of leg 03/21/2012   Rectus diastasis of lower abdomen 03/21/2012   Obstructive sleep apnea 10/15/2009   Class 2 severe obesity due to excess calories with serious comorbidity and body mass index (BMI) of 38.0 to 38.9 in adult [facility]) 09/02/2009   Hyperlipidemia 02/02/2007   HEMATURIA, MICROSCOPIC 02/02/2007   Essential hypertension 02/01/2007   CEREBROVASCULAR ACCIDENT, HX OF 02/01/2007    PCP: Abner Greenspan, MD  REFERRING PROVIDER: Leandrew Koyanagi, MD  REFERRING DIAG: [phone] (ICD-10-CM) - Status post total left knee replacement   THERAPY DIAG:  Stiffness of left knee, not elsewhere classified  Acute pain of left knee  Muscle weakness (generalized)  Unsteadiness on feet  Other abnormalities of gait and mobility  Localized edema  Rationale for Evaluation and Treatment: Rehabilitation  ONSET DATE: 06/18/2022 Left TKA  SUBJECTIVE:   SUBJECTIVE STATEMENT: He is standing hourly for 45 min during "work time" per PT recommendations.    PERTINENT HISTORY: OA, back  pain, obesity, HTN, CVA, contact dermatitis  PAIN:  Are you having pain?  No,  Pain location: L knee in general  Pain description: tight "like something is pulling on it", still numb too  Aggravating factors: bending, turning over in sleep Relieving factors: meds, ice, elevation, pillows between legs during sleep, CPM   PRECAUTIONS: None  WEIGHT BEARING RESTRICTIONS: No  FALLS:  Has patient fallen in last 6 months? No  LIVING ENVIRONMENT: Lives with: lives with their spouse and adult niece Lives in: House Stairs: Yes: Internal: 14 steps; on left going up and External: 4 steps; on right going up  bathroom & bedroom main level, his fish  aquarium is upstairs. 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  PATIENT GOALS:  return to work, household activities.  NEXT MD VISIT: 07/31/2022  OBJECTIVE:     DIAGNOSTIC FINDINGS: 06/18/2022 - Postsurgical changes from right knee arthroplasty. Evidence of periprosthetic fracture. No loosening. Overlying external material limits the ability to assess for soft tissue abnormality. Within this limitation no focal abnormality is visualized.  PATIENT SURVEYS:  EVAL: FOTO intake: 61%   predicted:  70% 08/18/2022: FOTO visit 11 72%  EDEMA:  RLE: above knee 47.5cm  around knee 45cm  below knee 42cm LLE: above knee 56.3cm  around knee 55cm below knee 44cm  POSTURE: rounded shoulders, forward head, flexed trunk , and weight shift right  PALPATION: Tenderness along joint line & scar. Steri-strips in place. Small scabs still present on incision.   LOWER EXTREMITY ROM:   ROM Left eval Left  07/15/22 Left 07/20/22 Left 07/27/22 Left 07/30/22 Left 08/05/22 Left 08/18/22 Left 08/21/22 Left 08/25/22  Knee flexion Supine  P: 94* Seated P:111* A: 102* Seated P:111* A: 103* Seated P:113* A: 103* Seated P:120* A: 107*   Supine:  A: 118 P: 122 Supine A: 118  Knee extension Seated: LAQ A: -18* Supine  P: -6* A: quad set -9* Seated: LAQ A: -9* Supine  P: -3* A: quad set -4* Seated: LAQ A: -9* Supine  P: -3* A: quad set -4* Seated: LAQ A: -9* Standing A: TKE -3* Seated A: -4* Standing A: 0* Seated A: 0* LAQ Seated A: 0* LAQ Supine A: 0 Supine A:    (Blank rows = not tested)  LOWER EXTREMITY MMT:  MMT Left eval Right 1214/23 Left 07/30/22 Left 09/03/22  Knee flexion 3-/5 HH dynameter 55.2# & 53.8# HH dynameter 30.2# & 27.4# LLE:RLE 52.8% HH dynameter 33.0# & 30.7# LLE:RLE 58.4%  Knee extension 3-/5 HH  dynameter 69.3# & 68.7# HH dynameter 44.1# & 43.1# LLE:RLE 63.2% HH dynameter 50.6# & 52.7# LLE:RLE 74.9%   (Blank rows = not tested)  FUNCTIONAL TESTS:  18 inch chair transfer: minA using BUEs on armrests to RW  GAIT: Distance walked: 80' Assistive device utilized: Environmental consultant - 2 wheeled Level of assistance: SBA Comments: excessive weight bearing BUEs and PWB on LLE   TODAY'S TREATMENT  DATE:  09/03/2022; Therapeutic Exercise: Leg Press: 137# bil LE's x 15 reps: marching for stance stabilization 125# 5 reps ea LE for 5 sets;  Left LE only: 81# x 15 Standing LE resistance green theraband  flex, abd, ext & add 10 reps ea BLEs. No UE support but supervision to add balance component.   Therapeutic Activities: PT recommending continue with setting timer on phone to go off each hour.  Stand as long as tolerated doing projects or exercise each hour during work hours to build stamina for return to work. Pt verbalized understanding.    Neuromuscular Re-edu:  Rocker board round with one pivot to require control in other direction: x 1 minute ant/post and right/left Vectors standing on ea LE with sliding disc knee flex on outward motion & ext on inward motion toward 6 cones (ant, ant-lat, lat, post-lat, post & post-med across midline) 5 reps ea LE Standing crossways on foam beam: initially static stance 30 sec; then eyes closed static 10 sec 3 reps; Cone tapping 2 x 10 crossing midline with 3 cones places in front of pt; squat 3 reps to pick up cones    09/01/2022: Therapeutic Exercise: Recumbent bike: Level 4, x 8 minutes, full revolutions Leg Press: 137# bil LE's x 15 reps: marching for stance stabilization 125# 5 reps ea LE for 5 sets;  Left LE only: 81# x 15 Side stepping 30 feet x 1 each direction c Level 3 band around knees; 2nd set with squat then lateral step raising out of squat 30' ea direction  Therapeutic  Activities: PT recommending setting timer on phone to go off each hour.  Stand as long as tolerated doing projects or exercise each hour during work hours to build stamina for return to work. Pt verbalized understanding.  PT educated pt on need for good shoes for support. Wearing old worn shoes (every day shoes & work shoes) can change kinetic chain of LEs and pain / issues. Pt verbalized understanding.   Neuromuscular Re-edu:  Rocker board round with one pivot to require control in other direction: x 1 minute ant/post and right/left Vectors standing on ea LE with sliding disc knee flex on outward motion & ext on inward motion toward 5 cones (ant, ant-lat, lat, post-lat & post) 5 reps ea LE   08/26/2022: Therapeutic Exercise: Recumbent bike: Level 3, x 8 minutes, full revolutions Leg Press: 131# bil LE's x 20, Left LE only: 81# x 20 Side stepping 30 feet x 2 each direction c Level 3 band around knees TRX  2 x 10 Neuromuscular Re-edu:  Stepping over and back on 4 inch step x 15 with Left LE planted on top of step Rocker board: x 1 minute df/pf and inver/eversion Vectors standing on left LE with sliding disc toward 3 cones Side stepping over 6 cones x 2 each direction Cone tapping 2 x 10 crossing midline with 2 cones places in front of pt    HOME EXERCISE PROGRAM: Access Code: [email or web address] URL: [email or web address] Date: 07/20/2022 Prepared by: Jamey Reas  Exercises - Ankle Alphabet in Elevation  - 2-4 x daily - 7 x weekly - 1 sets - 1 reps - Quad Setting and Stretching  - 2-4 x daily - 7 x weekly - 5-10 sets - 10 reps - prop 5-10 minutes & quad set5 seconds hold - Supine Leg Press  - 2-4 x daily - 7 x weekly - 5-10 sets - 10 reps - 5 seconds hold - Supine Heel Slide with  Strap  - 2-3 x daily - 7 x weekly - 2-3 sets - 10 reps - 5 seconds hold - Supine Straight Leg Raises  - 2-3 x daily - 7 x weekly - 2-3 sets - 10 reps - 5 seconds hold - Seated Knee Flexion Extension  AROM   - 2-4 x daily - 7 x weekly - 2-3 sets - 10 reps - 5 seconds hold - Seated straight leg lifts  - 2-3 x daily - 7 x weekly - 2-3 sets - 10 reps - 5 seconds hold - Seated Hamstring Stretch with Strap  - 2-4 x daily - 7 x weekly - 1 sets - 3 reps - 20-30 seconds hold - Seated Passive Knee Extension with Weight  - 1-3 x daily - 7 x weekly - 1 sets - 2-3 reps - 1-2 minutes hold - Seated Hamstring Stretch with Chair  - 1-3 x daily - 7 x weekly - 1 sets - 2-3 reps - 20-30 seconds hold - Gastroc Stretch on Step  - 1-3 x daily - 7 x weekly - 1 sets - 2-3 reps - 20-30 seconds hold - Supine Quadriceps Stretch with Strap on Table  - 1-3 x daily - 7 x weekly - 1 sets - 2-3 reps - 20-30 seconds hold  ASSESSMENT: CLINICAL IMPRESSION: Patient's strength has improved as noted by hand held dynameter testing but not to ideal level prior to discharging PT.  Pt reports improved function.  PT exercises are challenging him as PT continues to progress intensity including function & balance components.   Pt continues to benefit from skilled PT to improve function & ability to return to work.  OBJECTIVE IMPAIRMENTS: Abnormal gait, decreased activity tolerance, decreased balance, decreased endurance, decreased knowledge of condition, decreased knowledge of use of DME, decreased mobility, difficulty walking, decreased ROM, decreased strength, increased edema, increased muscle spasms, impaired flexibility, obesity, and pain.   ACTIVITY LIMITATIONS: carrying, lifting, bending, sitting, standing, squatting, sleeping, stairs, transfers, bed mobility, and locomotion level  PARTICIPATION LIMITATIONS: meal prep, cleaning, community activity, occupation, and yard work  PERSONAL FACTORS: 3+ comorbidities: see PMH  are also affecting patient's functional outcome.   REHAB POTENTIAL: Good  CLINICAL DECISION MAKING: Stable/uncomplicated  EVALUATION COMPLEXITY: Low   GOALS: Goals reviewed with patient? Yes  SHORT TERM  GOALS: (target date 08/07/2022)   1.  Patient will demonstrate independent use of home exercise program to maintain progress from in clinic treatments. Goal status: MET 08/05/22  2. Patient reports 25% improvement in left knee pain.  Goal Status:MET 08/05/22  3. Left knee PROM -3* ext to 100* flex  Goal Status:MET 08/05/22  LONG TERM GOALS: (target dates for all long term goals are 10 weeks  09/18/2021 )   1. Patient will demonstrate/report pain at worst less than or equal to 2/10 to facilitate minimal limitation in daily activity secondary to pain symptoms.  Goal status: Ongoing 08/18/2022   2. Patient will demonstrate independent use of home exercise program to facilitate ability to maintain/progress functional gains from skilled physical therapy services.  Goal status: Ongoing 08/18/2022   3. Patient will demonstrate FOTO outcome > or = 70 % to indicate reduced disability due to condition.  Goal status:  MET 08/18/2022   4.  Patient will demonstrate left LE MMT 5/5 throughout to faciltiate usual transfers, stairs, squatting at [facility] for daily life.   Goal status: Ongoing 08/18/2022   5.  Patient will demonstrate ability to perform work task of lifting & pushing  weighted cart.  Goal status: Ongoing 08/18/2022   6.  patient ambulates >500', negotiates ramps, curbs & stairs single rail independent without assistive device.  Goal status: Ongoing 08/18/2022   7.  Left knee AROM 0* ext standing and 110* flexion seated. Goal Status: Ongoing 08/18/2022   PLAN:  PT FREQUENCY: 2-3x/week  PT DURATION: 10 weeks  PLANNED INTERVENTIONS: Therapeutic exercises, Therapeutic activity, Neuro Muscular re-education, Balance training, Gait training, Patient/Family education, Joint mobilization, Stair training, DME instructions, Dry Needling, Electrical stimulation, Traction, Cryotherapy, vasopneumatic device, Moist heat, Taping, Ultrasound, Ionotophoresis '4mg'$ /ml Dexamethasone, and Manual therapy.  All  included unless contraindicated  PLAN FOR NEXT SESSION:   progressive strengthening exercises including functional exercises & some work simulation. Standing balance.     Jamey Reas, PT, DPT 09/03/2022, 11:17 AM

## 2022-09-06 ENCOUNTER — Other Ambulatory Visit: Payer: Self-pay | Admitting: Family Medicine

## 2022-09-08 ENCOUNTER — Ambulatory Visit (INDEPENDENT_AMBULATORY_CARE_PROVIDER_SITE_OTHER): Payer: 59 | Admitting: Physical Therapy

## 2022-09-08 ENCOUNTER — Encounter: Payer: Self-pay | Admitting: Physical Therapy

## 2022-09-08 DIAGNOSIS — M6281 Muscle weakness (generalized): Secondary | ICD-10-CM | POA: Diagnosis not present

## 2022-09-08 DIAGNOSIS — R2689 Other abnormalities of gait and mobility: Secondary | ICD-10-CM

## 2022-09-08 DIAGNOSIS — M25662 Stiffness of left knee, not elsewhere classified: Secondary | ICD-10-CM

## 2022-09-08 DIAGNOSIS — R2681 Unsteadiness on feet: Secondary | ICD-10-CM

## 2022-09-08 DIAGNOSIS — M25562 Pain in left knee: Secondary | ICD-10-CM | POA: Diagnosis not present

## 2022-09-08 DIAGNOSIS — R600 Localized edema: Secondary | ICD-10-CM

## 2022-09-08 NOTE — Therapy (Addendum)
OUTPATIENT PHYSICAL THERAPY LOWER EXTREMITY TREATMENT   Patient Name: Sean Lee MRN: [id number] DOB:[birth year], 62 y.o., male Today's Date: 09/08/2022     END OF SESSION:  PT End of Session - 09/08/22 0847     Visit Number 18    Number of Visits 22    Date for PT Re-Evaluation 09/18/22    Authorization Type [employer]    Authorization Time Period 20% co-insurance    Authorization - Visit Number 11    Authorization - Number of Visits 50    Progress Note Due on Visit 105    PT Start Time 0802    PT Stop Time 0845    PT Time Calculation (min) 43 min    Activity Tolerance Patient tolerated treatment well    Behavior During Therapy WFL for tasks assessed/performed                        Past Medical History:  Diagnosis Date   Contact dermatitis and other eczema due to plants (except food)    H/O: CVA (cardiovascular accident)    Carotid doppler neg (4/08)   Hematuria    Obesity, unspecified    Other and unspecified hyperlipidemia    Hx - no medication/diet controll and weight loss   Sacroiliitis, not elsewhere classified ([place])    Sleep apnea    uses CPAP but not every night   Unspecified essential hypertension    Varicose veins of left lower extremity    Past Surgical History:  Procedure Laterality Date   Carotid dopplers  11/16/2006   Negative   HERNIA REPAIR     umbilical   TOTAL KNEE ARTHROPLASTY Left 06/18/2022   TOTAL KNEE ARTHROPLASTY Left 06/18/2022   Procedure: LEFT TOTAL KNEE ARTHROPLASTY;  Surgeon: Leandrew Koyanagi, MD;  Location: [place];  Service: Orthopedics;  Laterality: Left;   Patient Active Problem List   Diagnosis Date Noted   Status post total left knee replacement 06/18/2022   Primary osteoarthritis of left knee 01/01/2022   Primary osteoarthritis of right knee 01/01/2022   Left knee pain 83/38/2505   Umbilical hernia 39/76/7341   ED (erectile dysfunction) 12/31/2020   Insomnia 04/02/2020   Right ankle swelling 07/25/2019   Gout  11/03/2018   Chronic pain of right ankle 08/31/2018   Routine general medical examination at a health care facility 11/01/2017   Prostate cancer screening 06/28/2017   Alopecia 11/04/2016   Back pain 05/06/2016   Sleep disorder 08/28/2015   Colon cancer screening 08/28/2015   Prediabetes 01/02/2013   Varicosities of leg 03/21/2012   Rectus diastasis of lower abdomen 03/21/2012   Obstructive sleep apnea 10/15/2009   Class 2 severe obesity due to excess calories with serious comorbidity and body mass index (BMI) of 38.0 to 38.9 in adult [facility]) 09/02/2009   Hyperlipidemia 02/02/2007   HEMATURIA, MICROSCOPIC 02/02/2007   Essential hypertension 02/01/2007   CEREBROVASCULAR ACCIDENT, HX OF 02/01/2007    PCP: Abner Greenspan, MD  REFERRING PROVIDER: Leandrew Koyanagi, MD  REFERRING DIAG: [phone] (ICD-10-CM) - Status post total left knee replacement   THERAPY DIAG:  Stiffness of left knee, not elsewhere classified  Acute pain of left knee  Muscle weakness (generalized)  Unsteadiness on feet  Other abnormalities of gait and mobility  Localized edema  Rationale for Evaluation and Treatment: Rehabilitation  ONSET DATE: 06/18/2022 Left TKA  SUBJECTIVE:   SUBJECTIVE STATEMENT: Pt arriving today reporting 2/10 pain in his left knee. Pt  stating his knee feels like it's going to buckle at times.   PERTINENT HISTORY: OA, back pain, obesity, HTN, CVA, contact dermatitis  PAIN:  Are you having pain?  No,  Pain location: L knee in general  Pain description: tight "like something is pulling on it", still numb too  Aggravating factors: bending, turning over in sleep Relieving factors: meds, ice, elevation, pillows between legs during sleep, CPM   PRECAUTIONS: None  WEIGHT BEARING RESTRICTIONS: No  FALLS:  Has patient fallen in last 6 months? No  LIVING ENVIRONMENT: Lives with: lives with their spouse and adult niece Lives in: House Stairs: Yes: Internal: 14 steps; on left going up  and External: 4 steps; on right going up  bathroom & bedroom main level, his fish aquarium is upstairs. 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  PATIENT GOALS:  return to work, household activities.  NEXT MD VISIT: 07/31/2022  OBJECTIVE:     DIAGNOSTIC FINDINGS: 06/18/2022 - Postsurgical changes from right knee arthroplasty. Evidence of periprosthetic fracture. No loosening. Overlying external material limits the ability to assess for soft tissue abnormality. Within this limitation no focal abnormality is visualized.  PATIENT SURVEYS:  EVAL: FOTO intake: 61%   predicted:  70% 08/18/2022: FOTO visit 11 72%  EDEMA:  RLE: above knee 47.5cm  around knee 45cm  below knee 42cm LLE: above knee 56.3cm  around knee 55cm below knee 44cm  POSTURE: rounded shoulders, forward head, flexed trunk , and weight shift right  PALPATION: Tenderness along joint line & scar. Steri-strips in place. Small scabs still present on incision.   LOWER EXTREMITY ROM:   ROM Left eval Left  07/15/22 Left 07/20/22 Left 07/27/22 Left 07/30/22 Left 08/05/22 Left 08/18/22 Left 08/21/22 Left 08/25/22 Left 09/08/22   Knee flexion Supine  P: 94* Seated P:111* A: 102* Seated P:111* A: 103* Seated P:113* A: 103* Seated P:120* A: 107*   Supine:  A: 118 P: 122 Supine A: 118 Sitting A: 116  Knee extension Seated: LAQ A: -18* Supine  P: -6* A: quad set -9* Seated: LAQ A: -9* Supine  P: -3* A: quad set -4* Seated: LAQ A: -9* Supine  P: -3* A: quad set -4* Seated: LAQ A: -9* Standing A: TKE -3* Seated A: -4* Standing A: 0* Seated A: 0* LAQ Seated A: 0* LAQ Supine A: 0 Supine A:  Sitting A: 0   (Blank rows = not tested)  LOWER EXTREMITY MMT:  MMT Left eval Right 1214/23 Left 07/30/22 Left 09/03/22  Knee flexion 3-/5 HH dynameter 55.2# &  53.8# HH dynameter 30.2# & 27.4# LLE:RLE 52.8% HH dynameter 33.0# & 30.7# LLE:RLE 58.4%  Knee extension 3-/5 HH dynameter 69.3# & 68.7# HH dynameter 44.1# & 43.1# LLE:RLE 63.2% HH dynameter 50.6# & 52.7# LLE:RLE 74.9%   (Blank rows = not tested)  FUNCTIONAL TESTS:  18 inch chair transfer: minA using BUEs on armrests to RW  GAIT: Distance walked: 80' Assistive device utilized: Environmental consultant - 2 wheeled Level of assistance: SBA Comments: excessive weight bearing BUEs and PWB on LLE   TODAY'S TREATMENT  DATE:  09/08/2022: Therapeutic Exercise: Recumbent bike: Level 3, x 8 minutes, full revolutions Leg Press: 137# bil LE's x 20, Left LE only: 87# x 20 TRX  2 x 10 Calf stretch on slant board: x 3 holding 30 sec Neuromuscular Re-edu:  Stepping over yoga blocks x 4  Rocker board: x 1 minute inversion/eversion Vectors standing on left LE with sliding disc toward 2 cones placed ant-lat and post-lat Box step x 5 each direction over PVC piping   09/03/2022; Therapeutic Exercise: Leg Press: 137# bil LE's x 15 reps: marching for stance stabilization 125# 5 reps ea LE for 5 sets;  Left LE only: 81# x 15 Standing LE resistance green theraband  flex, abd, ext & add 10 reps ea BLEs. No UE support but supervision to add balance component.   Therapeutic Activities: PT recommending continue with setting timer on phone to go off each hour.  Stand as long as tolerated doing projects or exercise each hour during work hours to build stamina for return to work. Pt verbalized understanding.    Neuromuscular Re-edu:  Rocker board round with one pivot to require control in other direction: x 1 minute ant/post and right/left Vectors standing on ea LE with sliding disc knee flex on outward motion & ext on inward motion toward 6 cones (ant, ant-lat, lat, post-lat, post & post-med across midline) 5 reps ea LE Standing crossways on foam  beam: initially static stance 30 sec; then eyes closed static 10 sec 3 reps; Cone tapping 2 x 10 crossing midline with 3 cones places in front of pt; squat 3 reps to pick up cones    09/01/2022: Therapeutic Exercise: Recumbent bike: Level 4, x 8 minutes, full revolutions Leg Press: 137# bil LE's x 15 reps: marching for stance stabilization 125# 5 reps ea LE for 5 sets;  Left LE only: 81# x 15 Side stepping 30 feet x 1 each direction c Level 3 band around knees; 2nd set with squat then lateral step raising out of squat 30' ea direction  Therapeutic Activities: PT recommending setting timer on phone to go off each hour.  Stand as long as tolerated doing projects or exercise each hour during work hours to build stamina for return to work. Pt verbalized understanding.  PT educated pt on need for good shoes for support. Wearing old worn shoes (every day shoes & work shoes) can change kinetic chain of LEs and pain / issues. Pt verbalized understanding.   Neuromuscular Re-edu:  Rocker board round with one pivot to require control in other direction: x 1 minute ant/post and right/left Vectors standing on ea LE with sliding disc knee flex on outward motion & ext on inward motion toward 5 cones (ant, ant-lat, lat, post-lat & post) 5 reps ea LE    HOME EXERCISE PROGRAM: Access Code: [email or web address] URL: [email or web address] Date: 07/20/2022 Prepared by: Jamey Reas  Exercises - Ankle Alphabet in Elevation  - 2-4 x daily - 7 x weekly - 1 sets - 1 reps - Quad Setting and Stretching  - 2-4 x daily - 7 x weekly - 5-10 sets - 10 reps - prop 5-10 minutes & quad set5 seconds hold - Supine Leg Press  - 2-4 x daily - 7 x weekly - 5-10 sets - 10 reps - 5 seconds hold - Supine Heel Slide with Strap  - 2-3 x daily - 7 x weekly - 2-3 sets - 10 reps - 5 seconds hold - Supine Straight Leg Raises  - 2-3  x daily - 7 x weekly - 2-3 sets - 10 reps - 5 seconds hold - Seated Knee Flexion Extension AROM   -  2-4 x daily - 7 x weekly - 2-3 sets - 10 reps - 5 seconds hold - Seated straight leg lifts  - 2-3 x daily - 7 x weekly - 2-3 sets - 10 reps - 5 seconds hold - Seated Hamstring Stretch with Strap  - 2-4 x daily - 7 x weekly - 1 sets - 3 reps - 20-30 seconds hold - Seated Passive Knee Extension with Weight  - 1-3 x daily - 7 x weekly - 1 sets - 2-3 reps - 1-2 minutes hold - Seated Hamstring Stretch with Chair  - 1-3 x daily - 7 x weekly - 1 sets - 2-3 reps - 20-30 seconds hold - Gastroc Stretch on Step  - 1-3 x daily - 7 x weekly - 1 sets - 2-3 reps - 20-30 seconds hold - Supine Quadriceps Stretch with Strap on Table  - 1-3 x daily - 7 x weekly - 1 sets - 2-3 reps - 20-30 seconds hold  ASSESSMENT: CLINICAL IMPRESSION: Pt arriving today reporting 2/10 pain in his left knee with reports of buckling feeling. Pt stating he has been working on standing tolerance up to 15-20 minutes to prepare for work. Treatment focusing on Left LE strengthening and dynamic balance to improve functional mobility for return to work.   OBJECTIVE IMPAIRMENTS: Abnormal gait, decreased activity tolerance, decreased balance, decreased endurance, decreased knowledge of condition, decreased knowledge of use of DME, decreased mobility, difficulty walking, decreased ROM, decreased strength, increased edema, increased muscle spasms, impaired flexibility, obesity, and pain.   ACTIVITY LIMITATIONS: carrying, lifting, bending, sitting, standing, squatting, sleeping, stairs, transfers, bed mobility, and locomotion level  PARTICIPATION LIMITATIONS: meal prep, cleaning, community activity, occupation, and yard work  PERSONAL FACTORS: 3+ comorbidities: see PMH  are also affecting patient's functional outcome.   REHAB POTENTIAL: Good  CLINICAL DECISION MAKING: Stable/uncomplicated  EVALUATION COMPLEXITY: Low   GOALS: Goals reviewed with patient? Yes  SHORT TERM GOALS: (target date 08/07/2022)   1.  Patient will demonstrate  independent use of home exercise program to maintain progress from in clinic treatments. Goal status: MET 08/05/22  2. Patient reports 25% improvement in left knee pain.  Goal Status:MET 08/05/22  3. Left knee PROM -3* ext to 100* flex  Goal Status:MET 08/05/22  LONG TERM GOALS: (target dates for all long term goals are 10 weeks  09/18/2021 )   1. Patient will demonstrate/report pain at worst less than or equal to 2/10 to facilitate minimal limitation in daily activity secondary to pain symptoms.  Goal status: Ongoing 09/08/2022   2. Patient will demonstrate independent use of home exercise program to facilitate ability to maintain/progress functional gains from skilled physical therapy services.  Goal status: Ongoing 09/08/2022   3. Patient will demonstrate FOTO outcome > or = 70 % to indicate reduced disability due to condition.  Goal status:  MET 08/18/2022   4.  Patient will demonstrate left LE MMT 5/5 throughout to faciltiate usual transfers, stairs, squatting at [facility] for daily life.   Goal status: Ongoing 08/18/2022   5.  Patient will demonstrate ability to perform work task of lifting & pushing weighted cart.  Goal status: Ongoing 09/08/2022   6.  patient ambulates >500', negotiates ramps, curbs & stairs single rail independent without assistive device.  Goal status: Ongoing 09/08/2022   7.  Left knee AROM 0*  ext standing and 110* flexion seated. Goal Status: MET 09/08/22   PLAN:  PT FREQUENCY: 2-3x/week  PT DURATION: 10 weeks  PLANNED INTERVENTIONS: Therapeutic exercises, Therapeutic activity, Neuro Muscular re-education, Balance training, Gait training, Patient/Family education, Joint mobilization, Stair training, DME instructions, Dry Needling, Electrical stimulation, Traction, Cryotherapy, vasopneumatic device, Moist heat, Taping, Ultrasound, Ionotophoresis '4mg'$ /ml Dexamethasone, and Manual therapy.  All included unless contraindicated  PLAN FOR NEXT SESSION:   progressive  strengthening exercises including functional exercises & some work simulation. Standing balance.     Oretha Caprice, PT, MPT 09/08/2022, 8:49 AM

## 2022-09-10 ENCOUNTER — Encounter: Payer: Self-pay | Admitting: Physical Therapy

## 2022-09-10 ENCOUNTER — Ambulatory Visit (INDEPENDENT_AMBULATORY_CARE_PROVIDER_SITE_OTHER): Payer: 59 | Admitting: Physical Therapy

## 2022-09-10 DIAGNOSIS — M25662 Stiffness of left knee, not elsewhere classified: Secondary | ICD-10-CM | POA: Diagnosis not present

## 2022-09-10 DIAGNOSIS — M6281 Muscle weakness (generalized): Secondary | ICD-10-CM

## 2022-09-10 DIAGNOSIS — R2689 Other abnormalities of gait and mobility: Secondary | ICD-10-CM

## 2022-09-10 DIAGNOSIS — M25562 Pain in left knee: Secondary | ICD-10-CM | POA: Diagnosis not present

## 2022-09-10 DIAGNOSIS — R600 Localized edema: Secondary | ICD-10-CM

## 2022-09-10 DIAGNOSIS — R2681 Unsteadiness on feet: Secondary | ICD-10-CM

## 2022-09-10 NOTE — Therapy (Signed)
OUTPATIENT PHYSICAL THERAPY LOWER EXTREMITY TREATMENT   Patient Name: Sean Lee MRN: [id number] DOB:03-May-1961, 62 y.o., male Today's Date: 09/10/2022     END OF SESSION:  PT End of Session - 09/10/22 0848     Visit Number 19    Number of Visits 22    Date for PT Re-Evaluation 09/18/22    Authorization Type [employer]    Authorization Time Period 20% co-insurance    Authorization - Number of Visits 71    PT Start Time [phone]    PT Stop Time 0928    PT Time Calculation (min) 42 min    Activity Tolerance Patient tolerated treatment well    Behavior During Therapy WFL for tasks assessed/performed                         Past Medical History:  Diagnosis Date   Contact dermatitis and other eczema due to plants (except food)    H/O: CVA (cardiovascular accident)    Carotid doppler neg (4/08)   Hematuria    Obesity, unspecified    Other and unspecified hyperlipidemia    Hx - no medication/diet controll and weight loss   Sacroiliitis, not elsewhere classified ([place])    Sleep apnea    uses CPAP but not every night   Unspecified essential hypertension    Varicose veins of left lower extremity    Past Surgical History:  Procedure Laterality Date   Carotid dopplers  11/16/2006   Negative   HERNIA REPAIR     umbilical   TOTAL KNEE ARTHROPLASTY Left 06/18/2022   TOTAL KNEE ARTHROPLASTY Left 06/18/2022   Procedure: LEFT TOTAL KNEE ARTHROPLASTY;  Surgeon: Leandrew Koyanagi, MD;  Location: [facility];  Service: Orthopedics;  Laterality: Left;   Patient Active Problem List   Diagnosis Date Noted   Status post total left knee replacement 06/18/2022   Primary osteoarthritis of left knee 01/01/2022   Primary osteoarthritis of right knee 01/01/2022   Left knee pain 93/81/8299   Umbilical hernia 37/16/9678   ED (erectile dysfunction) 12/31/2020   Insomnia 04/02/2020   Right ankle swelling 07/25/2019   Gout 11/03/2018   Chronic pain of right ankle 08/31/2018   Routine general  medical examination at a health care facility 11/01/2017   Prostate cancer screening 06/28/2017   Alopecia 11/04/2016   Back pain 05/06/2016   Sleep disorder 08/28/2015   Colon cancer screening 08/28/2015   Prediabetes 01/02/2013   Varicosities of leg 03/21/2012   Rectus diastasis of lower abdomen 03/21/2012   Obstructive sleep apnea 10/15/2009   Class 2 severe obesity due to excess calories with serious comorbidity and body mass index (BMI) of 38.0 to 38.9 in adult [facility]) 09/02/2009   Hyperlipidemia 02/02/2007   HEMATURIA, MICROSCOPIC 02/02/2007   Essential hypertension 02/01/2007   CEREBROVASCULAR ACCIDENT, HX OF 02/01/2007    PCP: Abner Greenspan, MD  REFERRING PROVIDER: Leandrew Koyanagi, MD  REFERRING DIAG: [phone] (ICD-10-CM) - Status post total left knee replacement   THERAPY DIAG:  Stiffness of left knee, not elsewhere classified  Muscle weakness (generalized)  Acute pain of left knee  Unsteadiness on feet  Other abnormalities of gait and mobility  Localized edema  Rationale for Evaluation and Treatment: Rehabilitation  ONSET DATE: 06/18/2022 Left TKA  SUBJECTIVE:   SUBJECTIVE STATEMENT: His legs are stiff with soreness.  He thinks it may be related to standing.    PERTINENT HISTORY: OA, back pain, obesity, HTN, CVA, contact  dermatitis  PAIN:  Are you having pain?  No,  Pain location: L knee in general  Pain description: tight "like something is pulling on it", still numb too  Aggravating factors: bending, turning over in sleep Relieving factors: meds, ice, elevation, pillows between legs during sleep, CPM   PRECAUTIONS: None  WEIGHT BEARING RESTRICTIONS: No  FALLS:  Has patient fallen in last 6 months? No  LIVING ENVIRONMENT: Lives with: lives with their spouse and adult niece Lives in: House Stairs: Yes: Internal: 14 steps; on left going up and External: 4 steps; on right going up  bathroom & bedroom main level, his fish aquarium is upstairs. 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  PATIENT GOALS:  return to work, household activities.  NEXT MD VISIT: 07/31/2022  OBJECTIVE:     DIAGNOSTIC FINDINGS: 06/18/2022 - Postsurgical changes from right knee arthroplasty. Evidence of periprosthetic fracture. No loosening. Overlying external material limits the ability to assess for soft tissue abnormality. Within this limitation no focal abnormality is visualized.  PATIENT SURVEYS:  EVAL: FOTO intake: 61%   predicted:  70% 08/18/2022: FOTO visit 11 72% 09/10/2022: FOTO 65%  EDEMA:  RLE: above knee 47.5cm  around knee 45cm  below knee 42cm LLE: above knee 56.3cm  around knee 55cm below knee 44cm  POSTURE: rounded shoulders, forward head, flexed trunk , and weight shift right  PALPATION: Tenderness along joint line & scar. Steri-strips in place. Small scabs still present on incision.   LOWER EXTREMITY ROM:   ROM Left eval Left  07/15/22 Left 07/20/22 Left 07/27/22 Left 07/30/22 Left 08/05/22 Left 08/18/22 Left 08/21/22 Left 08/25/22 Left 09/08/22   Knee flexion Supine  P: 94* Seated P:111* A: 102* Seated P:111* A: 103* Seated P:113* A: 103* Seated P:120* A: 107*   Supine:  A: 118 P: 122 Supine A: 118 Sitting A: 116  Knee extension Seated: LAQ A: -18* Supine  P: -6* A: quad set -9* Seated: LAQ A: -9* Supine  P: -3* A: quad set -4* Seated: LAQ A: -9* Supine  P: -3* A: quad set -4* Seated: LAQ A: -9* Standing A: TKE -3* Seated A: -4* Standing A: 0* Seated A: 0* LAQ Seated A: 0* LAQ Supine A: 0 Supine A:  Sitting A: 0   (Blank rows = not tested)  LOWER EXTREMITY MMT:  MMT Left eval Right 1214/23 Left 07/30/22 Left 09/03/22  Knee flexion 3-/5 HH dynameter 55.2# & 53.8# HH dynameter 30.2# & 27.4# LLE:RLE 52.8% HH dynameter 33.0# & 30.7# LLE:RLE 58.4%   Knee extension 3-/5 HH dynameter 69.3# & 68.7# HH dynameter 44.1# & 43.1# LLE:RLE 63.2% HH dynameter 50.6# & 52.7# LLE:RLE 74.9%   (Blank rows = not tested)  FUNCTIONAL TESTS:  18 inch chair transfer: minA using BUEs on armrests to RW  GAIT: Distance walked: 80' Assistive device utilized: Environmental consultant - 2 wheeled Level of assistance: SBA Comments: excessive weight bearing BUEs and PWB on LLE   TODAY'S TREATMENT  DATE:  09/10/2022: Therapeutic Exercise: Recumbent bike: Level 4, x 8 minutes, full revolutions Leg Press: 137# bil LE's x 20, Left LE only: 87# x 20 Hamstring stretch during rest SLR strap 30 sec hold 2 reps BLEs TRX lunge squat 10 reps alternating LEs so BLEs Calf stretch on slant board straddle step: x 3 holding 30 sec Squat - side step blue theraband 20' X 2 right & left.  Neuromuscular Re-edu:  Stepping over 6" hurdles leading with ea LE  6 reps 2 sets ea LE Rocker board round single pivot: x 1 minute ant/post & right/left Y-reach with light touch end motion 5 reps ea LE Box step x 6 each direction over PVC piping, 2 reps no wt, 2 reps 20# kettle bell RUE, 2 reps 20# kettle bee LUE  09/03/2022: Therapeutic Exercise: Recumbent bike: Level 3, x 8 minutes, full revolutions Leg Press: 137# bil LE's x 20, Left LE only: 87# x 20 TRX  2 x 10 Calf stretch on slant board: x 3 holding 30 sec Neuromuscular Re-edu:  Stepping over yoga blocks x 4  Rocker board: x 1 minute inversion/eversion Vectors standing on left LE with sliding disc toward 2 cones placed ant-lat and post-lat Box step x 5 each direction over PVC piping   09/03/2022; Therapeutic Exercise: Leg Press: 137# bil LE's x 15 reps: marching for stance stabilization 125# 5 reps ea LE for 5 sets;  Left LE only: 81# x 15 Standing LE resistance green theraband  flex, abd, ext & add 10 reps ea BLEs. No UE support but supervision to add balance  component.   Therapeutic Activities: PT recommending continue with setting timer on phone to go off each hour.  Stand as long as tolerated doing projects or exercise each hour during work hours to build stamina for return to work. Pt verbalized understanding.    Neuromuscular Re-edu:  Rocker board round with one pivot to require control in other direction: x 1 minute ant/post and right/left Vectors standing on ea LE with sliding disc knee flex on outward motion & ext on inward motion toward 6 cones (ant, ant-lat, lat, post-lat, post & post-med across midline) 5 reps ea LE Standing crossways on foam beam: initially static stance 30 sec; then eyes closed static 10 sec 3 reps; Cone tapping 2 x 10 crossing midline with 3 cones places in front of pt; squat 3 reps to pick up cones      HOME EXERCISE PROGRAM: Access Code: [email or web address] URL: [email or web address] Date: 07/20/2022 Prepared by: Jamey Reas  Exercises - Ankle Alphabet in Elevation  - 2-4 x daily - 7 x weekly - 1 sets - 1 reps - Quad Setting and Stretching  - 2-4 x daily - 7 x weekly - 5-10 sets - 10 reps - prop 5-10 minutes & quad set5 seconds hold - Supine Leg Press  - 2-4 x daily - 7 x weekly - 5-10 sets - 10 reps - 5 seconds hold - Supine Heel Slide with Strap  - 2-3 x daily - 7 x weekly - 2-3 sets - 10 reps - 5 seconds hold - Supine Straight Leg Raises  - 2-3 x daily - 7 x weekly - 2-3 sets - 10 reps - 5 seconds hold - Seated Knee Flexion Extension AROM   - 2-4 x daily - 7 x weekly - 2-3 sets - 10 reps - 5 seconds hold - Seated straight leg lifts  - 2-3 x daily - 7 x weekly - 2-3 sets -  10 reps - 5 seconds hold - Seated Hamstring Stretch with Strap  - 2-4 x daily - 7 x weekly - 1 sets - 3 reps - 20-30 seconds hold - Seated Passive Knee Extension with Weight  - 1-3 x daily - 7 x weekly - 1 sets - 2-3 reps - 1-2 minutes hold - Seated Hamstring Stretch with Chair  - 1-3 x daily - 7 x weekly - 1 sets - 2-3 reps - 20-30  seconds hold - Gastroc Stretch on Step  - 1-3 x daily - 7 x weekly - 1 sets - 2-3 reps - 20-30 seconds hold - Supine Quadriceps Stretch with Strap on Table  - 1-3 x daily - 7 x weekly - 1 sets - 2-3 reps - 20-30 seconds hold  ASSESSMENT: CLINICAL IMPRESSION: Patient has improved knee range, strength, balance & function.  Patient reports that his knee is better but is challenged by PT progressive exercises / activities.   OBJECTIVE IMPAIRMENTS: Abnormal gait, decreased activity tolerance, decreased balance, decreased endurance, decreased knowledge of condition, decreased knowledge of use of DME, decreased mobility, difficulty walking, decreased ROM, decreased strength, increased edema, increased muscle spasms, impaired flexibility, obesity, and pain.   ACTIVITY LIMITATIONS: carrying, lifting, bending, sitting, standing, squatting, sleeping, stairs, transfers, bed mobility, and locomotion level  PARTICIPATION LIMITATIONS: meal prep, cleaning, community activity, occupation, and yard work  PERSONAL FACTORS: 3+ comorbidities: see PMH  are also affecting patient's functional outcome.   REHAB POTENTIAL: Good  CLINICAL DECISION MAKING: Stable/uncomplicated  EVALUATION COMPLEXITY: Low   GOALS: Goals reviewed with patient? Yes  SHORT TERM GOALS: (target date 08/07/2022)   1.  Patient will demonstrate independent use of home exercise program to maintain progress from in clinic treatments. Goal status: MET 08/05/22  2. Patient reports 25% improvement in left knee pain.  Goal Status:MET 08/05/22  3. Left knee PROM -3* ext to 100* flex  Goal Status:MET 08/05/22  LONG TERM GOALS: (target dates for all long term goals are 10 weeks  09/18/2021 )   1. Patient will demonstrate/report pain at worst less than or equal to 2/10 to facilitate minimal limitation in daily activity secondary to pain symptoms.  Goal status: Ongoing 09/08/2022   2. Patient will demonstrate independent use of home  exercise program to facilitate ability to maintain/progress functional gains from skilled physical therapy services.  Goal status: Ongoing 09/08/2022   3. Patient will demonstrate FOTO outcome > or = 70 % to indicate reduced disability due to condition.  Goal status:  MET 08/18/2022   4.  Patient will demonstrate left LE MMT 5/5 throughout to faciltiate usual transfers, stairs, squatting at [facility] for daily life.   Goal status: Ongoing 08/18/2022   5.  Patient will demonstrate ability to perform work task of lifting & pushing weighted cart.  Goal status: Ongoing 09/08/2022   6.  patient ambulates >500', negotiates ramps, curbs & stairs single rail independent without assistive device.  Goal status: Ongoing 09/08/2022   7.  Left knee AROM 0* ext standing and 110* flexion seated. Goal Status: MET 09/08/22   PLAN:  PT FREQUENCY: 2-3x/week  PT DURATION: 10 weeks  PLANNED INTERVENTIONS: Therapeutic exercises, Therapeutic activity, Neuro Muscular re-education, Balance training, Gait training, Patient/Family education, Joint mobilization, Stair training, DME instructions, Dry Needling, Electrical stimulation, Traction, Cryotherapy, vasopneumatic device, Moist heat, Taping, Ultrasound, Ionotophoresis '4mg'$ /ml Dexamethasone, and Manual therapy.  All included unless contraindicated  PLAN FOR NEXT SESSION:  assess LTGs & send note to MD as has appt  prior to 2nd PT visit that week,  ?discharge vs recert, PT plans to hold decision until MD decides if he is ready to return to work,  progressive strengthening exercises including functional exercises & some work simulation. Standing balance.     Jamey Reas, PT, DPT 09/10/2022, 11:08 AM

## 2022-09-15 ENCOUNTER — Ambulatory Visit (INDEPENDENT_AMBULATORY_CARE_PROVIDER_SITE_OTHER): Payer: 59 | Admitting: Physical Therapy

## 2022-09-15 ENCOUNTER — Encounter: Payer: Self-pay | Admitting: Physical Therapy

## 2022-09-15 DIAGNOSIS — M6281 Muscle weakness (generalized): Secondary | ICD-10-CM | POA: Diagnosis not present

## 2022-09-15 DIAGNOSIS — M25562 Pain in left knee: Secondary | ICD-10-CM | POA: Diagnosis not present

## 2022-09-15 DIAGNOSIS — R600 Localized edema: Secondary | ICD-10-CM

## 2022-09-15 DIAGNOSIS — R2689 Other abnormalities of gait and mobility: Secondary | ICD-10-CM

## 2022-09-15 DIAGNOSIS — M25662 Stiffness of left knee, not elsewhere classified: Secondary | ICD-10-CM

## 2022-09-15 DIAGNOSIS — R2681 Unsteadiness on feet: Secondary | ICD-10-CM

## 2022-09-15 NOTE — Therapy (Signed)
OUTPATIENT PHYSICAL THERAPY LOWER EXTREMITY TREATMENT PROGRESS NOTE   Patient Name: TAYJON HALLADAY MRN: [id number] DOB:1961-01-09, 62 y.o., male Today's Date: 09/15/2022   Progress Note Reporting Period 07/30/22 to 09/15/2022   See note below for Objective Data and Assessment of Progress/Goals.       END OF SESSION:  PT End of Session - 09/15/22 0834     Visit Number 20    Number of Visits 22    Date for PT Re-Evaluation 09/18/22    Authorization Type [employer]    Authorization Time Period 20% co-insurance    Authorization - Number of Visits 50    Progress Note Due on Visit 90    PT Start Time 0803    PT Stop Time 0845    PT Time Calculation (min) 42 min    Activity Tolerance Patient tolerated treatment well    Behavior During Therapy WFL for tasks assessed/performed                          Past Medical History:  Diagnosis Date   Contact dermatitis and other eczema due to plants (except food)    H/O: CVA (cardiovascular accident)    Carotid doppler neg (4/08)   Hematuria    Obesity, unspecified    Other and unspecified hyperlipidemia    Hx - no medication/diet controll and weight loss   Sacroiliitis, not elsewhere classified ([place])    Sleep apnea    uses CPAP but not every night   Unspecified essential hypertension    Varicose veins of left lower extremity    Past Surgical History:  Procedure Laterality Date   Carotid dopplers  11/16/2006   Negative   HERNIA REPAIR     umbilical   TOTAL KNEE ARTHROPLASTY Left 06/18/2022   TOTAL KNEE ARTHROPLASTY Left 06/18/2022   Procedure: LEFT TOTAL KNEE ARTHROPLASTY;  Surgeon: Leandrew Koyanagi, MD;  Location: [place];  Service: Orthopedics;  Laterality: Left;   Patient Active Problem List   Diagnosis Date Noted   Status post total left knee replacement 06/18/2022   Primary osteoarthritis of left knee 01/01/2022   Primary osteoarthritis of right knee 01/01/2022   Left knee pain 41/28/7867   Umbilical hernia  67/20/9470   ED (erectile dysfunction) 12/31/2020   Insomnia 04/02/2020   Right ankle swelling 07/25/2019   Gout 11/03/2018   Chronic pain of right ankle 08/31/2018   Routine general medical examination at a health care facility 11/01/2017   Prostate cancer screening 06/28/2017   Alopecia 11/04/2016   Back pain 05/06/2016   Sleep disorder 08/28/2015   Colon cancer screening 08/28/2015   Prediabetes 01/02/2013   Varicosities of leg 03/21/2012   Rectus diastasis of lower abdomen 03/21/2012   Obstructive sleep apnea 10/15/2009   Class 2 severe obesity due to excess calories with serious comorbidity and body mass index (BMI) of 38.0 to 38.9 in adult [facility]) 09/02/2009   Hyperlipidemia 02/02/2007   HEMATURIA, MICROSCOPIC 02/02/2007   Essential hypertension 02/01/2007   CEREBROVASCULAR ACCIDENT, HX OF 02/01/2007    PCP: Abner Greenspan, MD  REFERRING PROVIDER: Leandrew Koyanagi, MD  REFERRING DIAG: [phone] (ICD-10-CM) - Status post total left knee replacement   THERAPY DIAG:  Stiffness of left knee, not elsewhere classified  Muscle weakness (generalized)  Acute pain of left knee  Unsteadiness on feet  Other abnormalities of gait and mobility  Localized edema  Rationale for Evaluation and Treatment: Rehabilitation  ONSET DATE: 06/18/2022  Left TKA  SUBJECTIVE:   SUBJECTIVE STATEMENT: Pt has concerns about going back to work. Pt has an appointment with Dr. Erlinda Hong tomorrow. Pt still reporting stiffness with bending and lifting in his left knee.   PERTINENT HISTORY: OA, back pain, obesity, HTN, CVA, contact dermatitis  PAIN:  Are you having pain?  No,  Pain location: L knee in general  Pain description: tight "like something is pulling on it", still numb too  Aggravating factors: bending, turning over in sleep Relieving factors: meds, ice, elevation, pillows between legs during sleep, CPM   PRECAUTIONS: None  WEIGHT BEARING RESTRICTIONS: No  FALLS:  Has patient fallen in last  6 months? No  LIVING ENVIRONMENT: Lives with: lives with their spouse and adult niece Lives in: House Stairs: Yes: Internal: 14 steps; on left going up and External: 4 steps; on right going up  bathroom & bedroom main level, his fish aquarium is upstairs. 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  PATIENT GOALS:  return to work, household activities.  NEXT MD VISIT: 07/31/2022  OBJECTIVE:     DIAGNOSTIC FINDINGS: 06/18/2022 - Postsurgical changes from right knee arthroplasty. Evidence of periprosthetic fracture. No loosening. Overlying external material limits the ability to assess for soft tissue abnormality. Within this limitation no focal abnormality is visualized.  PATIENT SURVEYS:  EVAL: FOTO intake: 61%   predicted:  70% 08/18/2022: FOTO visit 11 72% 09/10/2022: FOTO 65%  EDEMA:  RLE: above knee 47.5cm  around knee 45cm  below knee 42cm LLE: above knee 56.3cm  around knee 55cm below knee 44cm  POSTURE: rounded shoulders, forward head, flexed trunk , and weight shift right  PALPATION: Tenderness along joint line & scar. Steri-strips in place. Small scabs still present on incision.   LOWER EXTREMITY ROM:   ROM Left eval Left  07/15/22 Left 07/20/22 Left 07/27/22 Left 07/30/22 Left 08/05/22 Left 08/18/22 Left 08/21/22 Left 08/25/22 Left 09/08/22  Left 09/15/22  Knee flexion Supine  P: 94* Seated P:111* A: 102* Seated P:111* A: 103* Seated P:113* A: 103* Seated P:120* A: 107*   Supine:  A: 118 P: 122 Supine A: 118 Sitting A: 116 Sitting A: 116  Knee extension Seated: LAQ A: -18* Supine  P: -6* A: quad set -9* Seated: LAQ A: -9* Supine  P: -3* A: quad set -4* Seated: LAQ A: -9* Supine  P: -3* A: quad set -4* Seated: LAQ A: -9* Standing A: TKE -3* Seated A: -4* Standing A: 0* Seated A: 0* LAQ  Seated A: 0* LAQ Supine A: 0 Supine A:  Sitting A: 0 Sitting  A: 0   (Blank rows = not tested)  LOWER EXTREMITY MMT:  MMT Left eval Right 1214/23 Left 07/30/22 Left 09/03/22 Left 09/15/22  Knee flexion 3-/5 HH dynameter 55.2# & 53.8# HH dynameter 30.2# & 27.4# LLE:RLE 52.8% HH dynameter 33.0# & 30.7# LLE:RLE 58.4% HHD 33.7#, 34.8#  Knee extension 3-/5 HH dynameter 69.3# & 68.7# HH dynameter 44.1# & 43.1# LLE:RLE 63.2% HH dynameter 50.6# & 52.7# LLE:RLE 74.9% HHD 53.0#, 60.1#   (Blank rows = not tested)  FUNCTIONAL TESTS:  18 inch chair transfer: minA using BUEs on armrests to RW 09/15/22: 5 time sit to stand: no UE support 16 seconds first attempt               5 time sit to  stand: no UE support 12 seconds first attempt  GAIT: Distance walked: 70' Assistive device utilized: Environmental consultant - 2 wheeled Level of assistance: SBA Comments: excessive weight bearing BUEs and PWB on LLE   TODAY'S TREATMENT                                                                           DATE:  09/15/2022: Therapeutic Exercise: Scifit bike: Level 4, x 8 minutes, full revolutions Leg Press: 137# bil LE's x 20, Left LE only: 87# x 20, attempted 100# but pt unable to press using Left LE only TRX lunge squat 2 x10 reps alternating LEs so BLEs Calf stretch on slant board straddle step: x 3 holding 30 sec Squat: using 10# kettle bell x 10 Neuromuscular Re-edu:  Stepping over 6 inch hurdles forward x 4  Stepping over 6 inch hurdles sidestepping x 4   09/10/2022: Therapeutic Exercise: Recumbent bike: Level 4, x 8 minutes, full revolutions Leg Press: 137# bil LE's x 20, Left LE only: 87# x 20 Hamstring stretch during rest SLR strap 30 sec hold 2 reps BLEs TRX lunge squat 10 reps alternating LEs so BLEs Calf stretch on slant board straddle step: x 3 holding 30 sec Squat - side step blue theraband 20' X 2 right & left.  Neuromuscular Re-edu:  Stepping over 6" hurdles leading with ea LE  6  reps 2 sets ea LE Rocker board round single pivot: x 1 minute ant/post & right/left Y-reach with light touch end motion 5 reps ea LE Box step x 6 each direction over PVC piping, 2 reps no wt, 2 reps 20# kettle bell RUE, 2 reps 20# kettle bee LUE  09/03/2022: Therapeutic Exercise: Recumbent bike: Level 3, x 8 minutes, full revolutions Leg Press: 137# bil LE's x 20, Left LE only: 87# x 20 TRX  2 x 10 Calf stretch on slant board: x 3 holding 30 sec Neuromuscular Re-edu:  Stepping over yoga blocks x 4  Rocker board: x 1 minute inversion/eversion Vectors standing on left LE with sliding disc toward 2 cones placed ant-lat and post-lat Box step x 5 each direction over PVC piping       HOME EXERCISE PROGRAM: Access Code: [email or web address] URL: [email or web address] Date: 07/20/2022 Prepared by: Jamey Reas  Exercises - Ankle Alphabet in Elevation  - 2-4 x daily - 7 x weekly - 1 sets - 1 reps - Quad Setting and Stretching  - 2-4 x daily - 7 x weekly - 5-10 sets - 10 reps - prop 5-10 minutes & quad set5 seconds hold - Supine Leg Press  - 2-4 x daily - 7 x weekly - 5-10 sets - 10 reps - 5 seconds hold - Supine Heel Slide with Strap  - 2-3 x daily - 7 x weekly - 2-3 sets - 10 reps - 5 seconds hold - Supine Straight Leg Raises  - 2-3 x daily - 7 x weekly - 2-3 sets - 10 reps - 5 seconds hold - Seated Knee Flexion Extension AROM   - 2-4 x daily - 7 x weekly - 2-3 sets - 10 reps - 5 seconds hold - Seated straight leg lifts  - 2-3 x daily - 7 x weekly -  2-3 sets - 10 reps - 5 seconds hold - Seated Hamstring Stretch with Strap  - 2-4 x daily - 7 x weekly - 1 sets - 3 reps - 20-30 seconds hold - Seated Passive Knee Extension with Weight  - 1-3 x daily - 7 x weekly - 1 sets - 2-3 reps - 1-2 minutes hold - Seated Hamstring Stretch with Chair  - 1-3 x daily - 7 x weekly - 1 sets - 2-3 reps - 20-30 seconds hold - Gastroc Stretch on Step  - 1-3 x daily - 7 x weekly - 1 sets - 2-3 reps - 20-30  seconds hold - Supine Quadriceps Stretch with Strap on Table  - 1-3 x daily - 7 x weekly - 1 sets - 2-3 reps - 20-30 seconds hold  ASSESSMENT: CLINICAL IMPRESSION: Pt arriving today reporting 3/10 pain in his left knee. Pt stating concerns with returning to work due to difficulty and increased pain with squatting and lifting. We are scheduled to see him after his follow up with Dr. Erlinda Hong tomorrow and will revise pt's PT POC and reassess all his goals in order to maximize pt's function.      OBJECTIVE IMPAIRMENTS: Abnormal gait, decreased activity tolerance, decreased balance, decreased endurance, decreased knowledge of condition, decreased knowledge of use of DME, decreased mobility, difficulty walking, decreased ROM, decreased strength, increased edema, increased muscle spasms, impaired flexibility, obesity, and pain.   ACTIVITY LIMITATIONS: carrying, lifting, bending, sitting, standing, squatting, sleeping, stairs, transfers, bed mobility, and locomotion level  PARTICIPATION LIMITATIONS: meal prep, cleaning, community activity, occupation, and yard work  PERSONAL FACTORS: 3+ comorbidities: see PMH  are also affecting patient's functional outcome.   REHAB POTENTIAL: Good  CLINICAL DECISION MAKING: Stable/uncomplicated  EVALUATION COMPLEXITY: Low   GOALS: Goals reviewed with patient? Yes  SHORT TERM GOALS: (target date 08/07/2022)   1.  Patient will demonstrate independent use of home exercise program to maintain progress from in clinic treatments. Goal status: MET 08/05/22  2. Patient reports 25% improvement in left knee pain.  Goal Status:MET 08/05/22  3. Left knee PROM -3* ext to 100* flex  Goal Status:MET 08/05/22  LONG TERM GOALS: (target dates for all long term goals are 10 weeks  09/18/2021 )   1. Patient will demonstrate/report pain at worst less than or equal to 2/10 to facilitate minimal limitation in daily activity secondary to pain symptoms.  Goal status: Ongoing  09/08/2022   2. Patient will demonstrate independent use of home exercise program to facilitate ability to maintain/progress functional gains from skilled physical therapy services.  Goal status: Ongoing 09/08/2022   3. Patient will demonstrate FOTO outcome > or = 70 % to indicate reduced disability due to condition.  Goal status:  MET 08/18/2022   4.  Patient will demonstrate left LE MMT 5/5 throughout to faciltiate usual transfers, stairs, squatting at [facility] for daily life.   Goal status: Ongoing 08/18/2022   5.  Patient will demonstrate ability to perform work task of lifting & pushing weighted cart.  Goal status: Ongoing 09/08/2022   6.  patient ambulates >500', negotiates ramps, curbs & stairs single rail independent without assistive device.  Goal status: Ongoing 09/08/2022   7.  Left knee AROM 0* ext standing and 110* flexion seated. Goal Status: MET 09/08/22   PLAN:  PT FREQUENCY: 2-3x/week  PT DURATION: 10 weeks  PLANNED INTERVENTIONS: Therapeutic exercises, Therapeutic activity, Neuro Muscular re-education, Balance training, Gait training, Patient/Family education, Joint mobilization, Stair training, DME instructions, Dry  Needling, Electrical stimulation, Traction, Cryotherapy, vasopneumatic device, Moist heat, Taping, Ultrasound, Ionotophoresis '4mg'$ /ml Dexamethasone, and Manual therapy.  All included unless contraindicated  PLAN FOR NEXT SESSION:  Continue skilled PT 1x/week vs preparing discharge, assess following MD visit.  progressive strengthening exercises including functional exercises & some work simulation. Standing balance.     Oretha Caprice, PT, MPT 09/15/2022, 8:50 AM

## 2022-09-16 ENCOUNTER — Ambulatory Visit (INDEPENDENT_AMBULATORY_CARE_PROVIDER_SITE_OTHER): Payer: 59 | Admitting: Physical Therapy

## 2022-09-16 ENCOUNTER — Ambulatory Visit (INDEPENDENT_AMBULATORY_CARE_PROVIDER_SITE_OTHER): Payer: 59 | Admitting: Orthopaedic Surgery

## 2022-09-16 ENCOUNTER — Encounter: Payer: Self-pay | Admitting: Orthopaedic Surgery

## 2022-09-16 ENCOUNTER — Encounter: Payer: Self-pay | Admitting: Physical Therapy

## 2022-09-16 DIAGNOSIS — M25562 Pain in left knee: Secondary | ICD-10-CM

## 2022-09-16 DIAGNOSIS — R2681 Unsteadiness on feet: Secondary | ICD-10-CM | POA: Diagnosis not present

## 2022-09-16 DIAGNOSIS — R600 Localized edema: Secondary | ICD-10-CM

## 2022-09-16 DIAGNOSIS — R2689 Other abnormalities of gait and mobility: Secondary | ICD-10-CM

## 2022-09-16 DIAGNOSIS — M25662 Stiffness of left knee, not elsewhere classified: Secondary | ICD-10-CM | POA: Diagnosis not present

## 2022-09-16 DIAGNOSIS — Z96652 Presence of left artificial knee joint: Secondary | ICD-10-CM

## 2022-09-16 DIAGNOSIS — M6281 Muscle weakness (generalized): Secondary | ICD-10-CM | POA: Diagnosis not present

## 2022-09-16 NOTE — Addendum Note (Signed)
Addended by: Isaias Cowman on: 09/16/2022 12:13 PM   Modules accepted: Orders

## 2022-09-16 NOTE — Progress Notes (Signed)
Post-Op Visit Note   Patient: Sean Lee           Date of Birth: 1961/06/22           MRN: [id number] Visit Date: 09/16/2022 PCP: Abner Greenspan, MD   Assessment & Plan:  Chief Complaint:  Chief Complaint  Patient presents with   Left Knee - Follow-up    Left total knee arthroplasty 06/18/2022   Visit Diagnoses:  1. Status post total left knee replacement     Plan: Mr. Willcutt is 3 months status post left total knee replacement.  He reports soreness and tightness.  He is attending physical therapy twice a week.  Examination left knee shows fully healed surgical scar.  Expected postsurgical changes.  Painless range of motion of the knee.  Good varus valgus stability.  His range of motion is progressing nicely in physical therapy.  He is almost at 120 degrees.  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  He will continue to do physical therapy.  Dental prophylaxis reinforced.  Recheck in 3 months with two-view x-rays left knee.  Work note provided today.  Follow-Up Instructions: Return in about 3 months (around 12/15/2022).   Orders:  No orders of the defined types were placed in this encounter.  No orders of the defined types were placed in this encounter.   Imaging: No results found.  PMFS History: Patient Active Problem List   Diagnosis Date Noted   Status post total left knee replacement 06/18/2022   Primary osteoarthritis of left knee 01/01/2022   Primary osteoarthritis of right knee 01/01/2022   Left knee pain 48/54/6270   Umbilical hernia 35/00/9381   ED (erectile dysfunction) 12/31/2020   Insomnia 04/02/2020   Right ankle swelling 07/25/2019   Gout 11/03/2018   Chronic pain of right ankle 08/31/2018   Routine general medical examination at a health care facility 11/01/2017   Prostate cancer screening  06/28/2017   Alopecia 11/04/2016   Back pain 05/06/2016   Sleep disorder 08/28/2015   Colon cancer screening 08/28/2015   Prediabetes 01/02/2013   Varicosities of leg 03/21/2012   Rectus diastasis of lower abdomen 03/21/2012   Obstructive sleep apnea 10/15/2009   Class 2 severe obesity due to excess calories with serious comorbidity and body mass index (BMI) of 38.0 to 38.9 in adult [facility]) 09/02/2009   Hyperlipidemia 02/02/2007   HEMATURIA, MICROSCOPIC 02/02/2007   Essential hypertension 02/01/2007   CEREBROVASCULAR ACCIDENT, HX OF 02/01/2007   Past Medical History:  Diagnosis Date   Contact dermatitis and other eczema due to plants (except food)    H/O: CVA (cardiovascular accident)    Carotid doppler neg (4/08)   Hematuria    Obesity, unspecified    Other and unspecified hyperlipidemia    Hx - no medication/diet controll and weight loss   Sacroiliitis, not elsewhere classified ([place])    Sleep apnea    uses CPAP but not every night   Unspecified essential hypertension    Varicose veins of left lower extremity     Family History  Problem Relation Age of Onset   Hypertension Mother    Diabetes Mother    Hyperlipidemia Unknown        in family    Cancer Neg Hx    Esophageal cancer Neg Hx  Rectal cancer Neg Hx    Stomach cancer Neg Hx    Colon cancer Neg Hx     Past Surgical History:  Procedure Laterality Date   Carotid dopplers  11/16/2006   Negative   HERNIA REPAIR     umbilical   TOTAL KNEE ARTHROPLASTY Left 06/18/2022   TOTAL KNEE ARTHROPLASTY Left 06/18/2022   Procedure: LEFT TOTAL KNEE ARTHROPLASTY;  Surgeon: Leandrew Koyanagi, MD;  Location: [place];  Service: Orthopedics;  Laterality: Left;   Social History   Occupational History   Occupation: Truck Education administrator: [employer]  Tobacco Use   Smoking status: Never   Smokeless tobacco: Never  Vaping Use   Vaping Use: Never used  Substance and Sexual Activity   Alcohol use: Yes    Alcohol/week: 4.0 standard  drinks of alcohol    Types: 4 Cans of beer per week    Comment: weekends   Drug use: No   Sexual activity: Not on file

## 2022-09-16 NOTE — Therapy (Addendum)
OUTPATIENT PHYSICAL THERAPY LOWER EXTREMITY TREATMENT & RECERTIFICATION   Patient Name: Sean Lee MRN: [id number] DOB:18-Jan-1961, 62 y.o., male Today's Date: 09/16/2022   END OF SESSION:  PT End of Session - 09/16/22 0927     Visit Number 21    Number of Visits 29    Date for PT Re-Evaluation 11/13/22    Authorization Type [employer]    Authorization Time Period 20% co-insurance    Authorization - Number of Visits 50    Progress Note Due on Visit 33    PT Start Time 0927    PT Stop Time 1015    PT Time Calculation (min) 48 min    Activity Tolerance Patient tolerated treatment well    Behavior During Therapy WFL for tasks assessed/performed                           Past Medical History:  Diagnosis Date   Contact dermatitis and other eczema due to plants (except food)    H/O: CVA (cardiovascular accident)    Carotid doppler neg (4/08)   Hematuria    Obesity, unspecified    Other and unspecified hyperlipidemia    Hx - no medication/diet controll and weight loss   Sacroiliitis, not elsewhere classified ([place])    Sleep apnea    uses CPAP but not every night   Unspecified essential hypertension    Varicose veins of left lower extremity    Past Surgical History:  Procedure Laterality Date   Carotid dopplers  11/16/2006   Negative   HERNIA REPAIR     umbilical   TOTAL KNEE ARTHROPLASTY Left 06/18/2022   TOTAL KNEE ARTHROPLASTY Left 06/18/2022   Procedure: LEFT TOTAL KNEE ARTHROPLASTY;  Surgeon: Sean Koyanagi, MD;  Location: [place];  Service: Orthopedics;  Laterality: Left;   Patient Active Problem List   Diagnosis Date Noted   Status post total left knee replacement 06/18/2022   Primary osteoarthritis of left knee 01/01/2022   Primary osteoarthritis of right knee 01/01/2022   Left knee pain 80/99/8338   Umbilical hernia 25/12/3974   ED (erectile dysfunction) 12/31/2020   Insomnia 04/02/2020   Right ankle swelling 07/25/2019   Gout 11/03/2018    Chronic pain of right ankle 08/31/2018   Routine general medical examination at a health care facility 11/01/2017   Prostate cancer screening 06/28/2017   Alopecia 11/04/2016   Back pain 05/06/2016   Sleep disorder 08/28/2015   Colon cancer screening 08/28/2015   Prediabetes 01/02/2013   Varicosities of leg 03/21/2012   Rectus diastasis of lower abdomen 03/21/2012   Obstructive sleep apnea 10/15/2009   Class 2 severe obesity due to excess calories with serious comorbidity and body mass index (BMI) of 38.0 to 38.9 in adult [facility]) 09/02/2009   Hyperlipidemia 02/02/2007   HEMATURIA, MICROSCOPIC 02/02/2007   Essential hypertension 02/01/2007   CEREBROVASCULAR ACCIDENT, HX OF 02/01/2007    PCP: Sean Greenspan, MD  REFERRING PROVIDER: Leandrew Koyanagi, MD  REFERRING DIAG: [phone] (ICD-10-CM) - Status post total left knee replacement   THERAPY DIAG:  Stiffness of left knee, not elsewhere classified  Muscle weakness (generalized)  Acute pain of left knee  Unsteadiness on feet  Other abnormalities of gait and mobility  Localized edema  Rationale for Evaluation and Treatment: Rehabilitation  ONSET DATE: 06/18/2022 Left TKA  SUBJECTIVE:   SUBJECTIVE STATEMENT:  He saw Dr. Erlinda Lee who is signing him out of work until 3/1 for now.  He is concerned with swelling that is still present.  Pt reports still elevating LE to help with swelling.  Pt feels that he needs PT ~1x/wk still.    PERTINENT HISTORY: OA, back pain, obesity, HTN, CVA, contact dermatitis  PAIN:  Are you having pain?  2/10  (he had to take meds & ice last night)  Pain location: L knee in general  Pain description: tight "like something is pulling on it", still numb too  Aggravating factors: bending, turning over in sleep Relieving factors: meds, ice, elevation, pillows between legs during sleep, CPM   PRECAUTIONS: None  WEIGHT BEARING RESTRICTIONS: No  FALLS:  Has patient fallen in last 6 months? No  LIVING  ENVIRONMENT: Lives with: lives with their spouse and adult niece Lives in: House Stairs: Yes: Internal: 14 steps; on left going up and External: 4 steps; on right going up  bathroom & bedroom main level, his fish aquarium is upstairs. 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  PATIENT GOALS:  return to work, household activities.  NEXT MD VISIT: 07/31/2022  OBJECTIVE:  DIAGNOSTIC FINDINGS: 06/18/2022 - Postsurgical changes from right knee arthroplasty. Evidence of periprosthetic fracture. No loosening. Overlying external material limits the ability to assess for soft tissue abnormality. Within this limitation no focal abnormality is visualized.  PATIENT SURVEYS:  EVAL: FOTO intake: 61%   predicted:  70% 08/18/2022: FOTO visit 11 72% 09/10/2022: FOTO 65%  EDEMA:  RLE: above knee 47.5cm  around knee 45cm  below knee 42cm LLE: above knee 56.3cm  around knee 55cm below knee 44cm  POSTURE: rounded shoulders, forward head, flexed trunk , and weight shift right  PALPATION: Tenderness along joint line & scar. Steri-strips in place. Small scabs still present on incision.   LOWER EXTREMITY ROM:   ROM Left eval Left  07/15/22 Left 07/20/22 Left 07/27/22 Left 07/30/22 Left 08/05/22 Left 08/18/22 Left 08/21/22 Left 08/25/22 Left 09/08/22  Left 09/15/22  Knee flexion Supine  P: 94* Seated P:111* A: 102* Seated P:111* A: 103* Seated P:113* A: 103* Seated P:120* A: 107*   Supine:  A: 118 P: 122 Supine A: 118 Sitting A: 116 Sitting A: 116  Knee extension Seated: LAQ A: -18* Supine  P: -6* A: quad set -9* Seated: LAQ A: -9* Supine  P: -3* A: quad set -4* Seated: LAQ A: -9* Supine  P: -3* A: quad set -4* Seated: LAQ A: -9* Standing A: TKE -3* Seated A: -4* Standing A: 0* Seated A: 0* LAQ Seated A: 0* LAQ Supine A: 0  Supine A:  Sitting A: 0 Sitting  A: 0   (Blank rows = not tested)  LOWER EXTREMITY MMT:  MMT Left eval Right 1214/23 Left 07/30/22 Left 09/03/22 Left 09/15/22  Knee flexion 3-/5 HH dynameter 55.2# & 53.8# HH dynameter 30.2# & 27.4# LLE:RLE 52.8% HH dynameter 33.0# & 30.7# LLE:RLE 58.4% HHD 33.7#, 34.8#  Knee extension 3-/5 HH dynameter 69.3# & 68.7# HH dynameter 44.1# & 43.1# LLE:RLE 63.2% HH dynameter 50.6# & 52.7# LLE:RLE 74.9% HHD 53.0#, 60.1#   (Blank rows = not tested)  FUNCTIONAL TESTS:  18 inch chair transfer: minA using BUEs on armrests to RW 09/15/22: 5 time sit to stand: no UE support 16 seconds first attempt               5 time sit to  stand: no UE support 12 seconds first attempt  GAIT: Distance walked: 62' Assistive device utilized: Environmental consultant - 2 wheeled Level of assistance: SBA Comments: excessive weight bearing BUEs and PWB on LLE   TODAY'S TREATMENT                                                                           DATE:  09/16/2022: Therapeutic Exercise: Leg Press: 137# bil LE's x 25, Left LE only: 87# x 20,  Push / pull using wt stacks 40# total (20 per stack) 5 reps ea with wt anterior & posterior - walking forward/backward ~5-6' Squat lift with pulley wt using bar 50# standing on 4" step for depth of squat 10 reps 2 sets. Knee ext machine 35# BLEs 15 reps;  15# BLEs concentric, single leg isometric/eccentric 5 reps 2 sets with ea LE;  10# single leg & eccentric 5 reps 2 sets ea LE Knee flex machine PT instructed in above exercises & gym fitness recommendation for every other day.  On other days do home exercises.  Daily do stretches. Pt verbalized understanding.     09/15/2022: Therapeutic Exercise: Scifit bike: Level 4, x 8 minutes, full revolutions Leg Press: 137# bil LE's x 20, Left LE only: 87# x 20, attempted 100# but pt unable to press using Left LE only TRX lunge squat 2 x10 reps alternating LEs so BLEs Calf stretch on slant board  straddle step: x 3 holding 30 sec Squat: using 10# kettle bell x 10 Neuromuscular Re-edu:  Stepping over 6 inch hurdles forward x 4  Stepping over 6 inch hurdles sidestepping x 4   09/10/2022: Therapeutic Exercise: Recumbent bike: Level 4, x 8 minutes, full revolutions Leg Press: 137# bil LE's x 20, Left LE only: 87# x 20 Hamstring stretch during rest SLR strap 30 sec hold 2 reps BLEs TRX lunge squat 10 reps alternating LEs so BLEs Calf stretch on slant board straddle step: x 3 holding 30 sec Squat - side step blue theraband 20' X 2 right & left.  Neuromuscular Re-edu:  Stepping over 6" hurdles leading with ea LE  6 reps 2 sets ea LE Rocker board round single pivot: x 1 minute ant/post & right/left Y-reach with light touch end motion 5 reps ea LE Box step x 6 each direction over PVC piping, 2 reps no wt, 2 reps 20# kettle bell RUE, 2 reps 20# kettle bee LUE    HOME EXERCISE PROGRAM: Access Code: [email or web address] URL: [email or web address] Date: 07/20/2022 Prepared by: Jamey Reas  Exercises - Ankle Alphabet in Elevation  - 2-4 x daily - 7 x weekly - 1 sets - 1 reps - Quad Setting and Stretching  - 2-4 x daily - 7 x weekly - 5-10 sets - 10 reps - prop 5-10 minutes & quad set5 seconds hold - Supine Leg Press  - 2-4 x daily - 7 x weekly - 5-10 sets - 10 reps - 5 seconds hold - Supine Heel Slide with Strap  - 2-3 x daily - 7 x weekly - 2-3 sets - 10 reps - 5 seconds hold - Supine Straight Leg Raises  - 2-3 x daily - 7 x weekly - 2-3 sets - 10 reps -  5 seconds hold - Seated Knee Flexion Extension AROM   - 2-4 x daily - 7 x weekly - 2-3 sets - 10 reps - 5 seconds hold - Seated straight leg lifts  - 2-3 x daily - 7 x weekly - 2-3 sets - 10 reps - 5 seconds hold - Seated Hamstring Stretch with Strap  - 2-4 x daily - 7 x weekly - 1 sets - 3 reps - 20-30 seconds hold - Seated Passive Knee Extension with Weight  - 1-3 x daily - 7 x weekly - 1 sets - 2-3 reps - 1-2 minutes hold -  Seated Hamstring Stretch with Chair  - 1-3 x daily - 7 x weekly - 1 sets - 2-3 reps - 20-30 seconds hold - Gastroc Stretch on Step  - 1-3 x daily - 7 x weekly - 1 sets - 2-3 reps - 20-30 seconds hold - Supine Quadriceps Stretch with Strap on Table  - 1-3 x daily - 7 x weekly - 1 sets - 2-3 reps - 20-30 seconds hold  ASSESSMENT: CLINICAL IMPRESSION: Patient appears would benefit from PT weekly for instructions in progressing exercises & activities to enable return to work.  See objective data for current level.  He continues to have edema following activities similar to work.  He appears to understand PT recommendations for gym exercises.   OBJECTIVE IMPAIRMENTS: Abnormal gait, decreased activity tolerance, decreased balance, decreased endurance, decreased knowledge of condition, decreased knowledge of use of DME, decreased mobility, difficulty walking, decreased ROM, decreased strength, increased edema, increased muscle spasms, impaired flexibility, obesity, and pain.   ACTIVITY LIMITATIONS: carrying, lifting, bending, sitting, standing, squatting, sleeping, stairs, transfers, bed mobility, and locomotion level  PARTICIPATION LIMITATIONS: meal prep, cleaning, community activity, occupation, and yard work  PERSONAL FACTORS: 3+ comorbidities: see PMH  are also affecting patient's functional outcome.   REHAB POTENTIAL: Good  CLINICAL DECISION MAKING: Stable/uncomplicated  EVALUATION COMPLEXITY: Low   GOALS: Goals reviewed with patient? Yes  UPDATED SHORT TERM GOALS: (target date 10/16/2022)   1.  Patient will demonstrate understanding of updated home exercise program including gym / fitness center program to progress. Goal status: ongoing 09/16/2022  2. Patient reports left knee pain 2/10 with above exercises / activities.  Goal Status:    3. Left knee PROM -3* ext to 100* flex  Goal Status:MET 08/05/22  LONG TERM GOALS: (target dates for all long term goals are 10 weeks  09/18/2021 )    1. Patient will demonstrate/report pain at worst less than or equal to 2/10 to facilitate minimal limitation in daily activity secondary to pain symptoms.  Goal status: partially MET 09/16/2022   2. Patient will demonstrate independent use of home exercise program to facilitate ability to maintain/progress functional gains from skilled physical therapy services.  Goal status: Ongoing 09/16/2022   3. Patient will demonstrate FOTO outcome > or = 70 % to indicate reduced disability due to condition.  Goal status:  MET 08/18/2022   4.  Patient will demonstrate left LE MMT 5/5 throughout to faciltiate usual transfers, stairs, squatting at [facility] for daily life.   Goal status: Ongoing 09/16/2022   5.  Patient will demonstrate ability to perform work task of lifting & pushing weighted cart.  Goal status: Ongoing 09/16/2022   6.  patient ambulates >500', negotiates ramps, curbs & stairs single rail independent without assistive device.  Goal status: MET 09/16/2022   7.  Left knee AROM 0* ext standing and 110* flexion seated. Goal Status:  MET 09/08/22  UPDATED LONG TERM GOALS: (target dates for all long term goals are 8 weeks  11/13/2022 )   1. Patient will demonstrate/report pain at worst less than or equal to 2/10 to facilitate minimal limitation in daily activity secondary to pain symptoms.  Goal status: ongoing 09/16/2022   2. Patient will demonstrate independent use of home exercise program to facilitate ability to maintain/progress functional gains from skilled physical therapy services.  Goal status: Ongoing 09/16/2022   3. Patient will demonstrate FOTO outcome > or = 70 % to indicate reduced disability due to condition.  Goal status:  MET 08/18/2022   4.  Patient will demonstrate left LE MMT 5/5 throughout to faciltiate usual transfers, stairs, squatting at [facility] for daily life.   Goal status: Ongoing 09/16/2022   5.  Patient will demonstrate ability to perform work task of lifting &  pushing weighted cart.  Goal status: Ongoing 09/16/2022   6.  patient ambulates >500', negotiates ramps, curbs & stairs single rail independent without assistive device.  Goal status: MET 09/16/2022   7.  Left knee AROM 0* ext standing and 110* flexion seated. Goal Status: MET 09/08/22  PLAN:  PT FREQUENCY: 1x/week  PT DURATION: 8 weeks  PLANNED INTERVENTIONS: Therapeutic exercises, Therapeutic activity, Neuro Muscular re-education, Balance training, Gait training, Patient/Family education, Joint mobilization, Stair training, DME instructions, Dry Needling, Electrical stimulation, Traction, Cryotherapy, vasopneumatic device, Moist heat, Taping, Ultrasound, Ionotophoresis '4mg'$ /ml Dexamethasone, and Manual therapy.  All included unless contraindicated  PLAN FOR NEXT SESSION:    Continue skilled PT 1x/week instructing in HEP/gym/ activities, progressive strengthening exercises including functional exercises & some work simulation. Standing balance.     Jamey Reas, PT, DPT 09/16/2022, 12:11 PM

## 2022-09-18 ENCOUNTER — Telehealth: Payer: Self-pay | Admitting: Orthopaedic Surgery

## 2022-09-18 NOTE — Telephone Encounter (Signed)
Patient dropped off DOT forms. ([employer] doesn't complete these forms). He states he is oow but the DOT wants forms before his return. He states to complete portions of the form that would pertain to his knee replacement surgery. Patients ph [phone]

## 2022-09-21 ENCOUNTER — Encounter: Payer: 59 | Admitting: Physical Therapy

## 2022-09-21 NOTE — Telephone Encounter (Signed)
Placed forms on your desk.

## 2022-09-22 ENCOUNTER — Other Ambulatory Visit: Payer: Self-pay | Admitting: Surgery

## 2022-09-23 ENCOUNTER — Ambulatory Visit (INDEPENDENT_AMBULATORY_CARE_PROVIDER_SITE_OTHER): Payer: 59 | Admitting: Physical Therapy

## 2022-09-23 ENCOUNTER — Encounter: Payer: Self-pay | Admitting: Physical Therapy

## 2022-09-23 DIAGNOSIS — M25562 Pain in left knee: Secondary | ICD-10-CM

## 2022-09-23 DIAGNOSIS — M6281 Muscle weakness (generalized): Secondary | ICD-10-CM | POA: Diagnosis not present

## 2022-09-23 DIAGNOSIS — M25662 Stiffness of left knee, not elsewhere classified: Secondary | ICD-10-CM | POA: Diagnosis not present

## 2022-09-23 DIAGNOSIS — R2681 Unsteadiness on feet: Secondary | ICD-10-CM

## 2022-09-23 DIAGNOSIS — R600 Localized edema: Secondary | ICD-10-CM

## 2022-09-23 DIAGNOSIS — R2689 Other abnormalities of gait and mobility: Secondary | ICD-10-CM

## 2022-09-23 NOTE — Therapy (Signed)
OUTPATIENT PHYSICAL THERAPY LOWER EXTREMITY TREATMENT & RECERTIFICATION   Patient Name: Sean Lee MRN: [id number] DOB:[birth year], 62 y.o., male Today's Date: 09/23/2022   END OF SESSION:  PT End of Session - 09/23/22 0804     Visit Number 22    Number of Visits 29    Date for PT Re-Evaluation 11/13/22    Authorization Type [employer]    Authorization Time Period 20% co-insurance    Authorization - Number of Visits 50    Progress Note Due on Visit 46    PT Start Time 0802    PT Stop Time 0845    PT Time Calculation (min) 43 min    Activity Tolerance Patient tolerated treatment well    Behavior During Therapy WFL for tasks assessed/performed                           Past Medical History:  Diagnosis Date   Contact dermatitis and other eczema due to plants (except food)    H/O: CVA (cardiovascular accident)    Carotid doppler neg (4/08)   Hematuria    Obesity, unspecified    Other and unspecified hyperlipidemia    Hx - no medication/diet controll and weight loss   Sacroiliitis, not elsewhere classified ([place])    Sleep apnea    uses CPAP but not every night   Unspecified essential hypertension    Varicose veins of left lower extremity    Past Surgical History:  Procedure Laterality Date   Carotid dopplers  11/16/2006   Negative   HERNIA REPAIR     umbilical   TOTAL KNEE ARTHROPLASTY Left 06/18/2022   TOTAL KNEE ARTHROPLASTY Left 06/18/2022   Procedure: LEFT TOTAL KNEE ARTHROPLASTY;  Surgeon: Leandrew Koyanagi, MD;  Location: [facility];  Service: Orthopedics;  Laterality: Left;   Patient Active Problem List   Diagnosis Date Noted   Status post total left knee replacement 06/18/2022   Primary osteoarthritis of left knee 01/01/2022   Primary osteoarthritis of right knee 01/01/2022   Left knee pain 82/95/6213   Umbilical hernia 08/65/7846   ED (erectile dysfunction) 12/31/2020   Insomnia 04/02/2020   Right ankle swelling 07/25/2019   Gout 11/03/2018    Chronic pain of right ankle 08/31/2018   Routine general medical examination at a health care facility 11/01/2017   Prostate cancer screening 06/28/2017   Alopecia 11/04/2016   Back pain 05/06/2016   Sleep disorder 08/28/2015   Colon cancer screening 08/28/2015   Prediabetes 01/02/2013   Varicosities of leg 03/21/2012   Rectus diastasis of lower abdomen 03/21/2012   Obstructive sleep apnea 10/15/2009   Class 2 severe obesity due to excess calories with serious comorbidity and body mass index (BMI) of 38.0 to 38.9 in adult [facility]) 09/02/2009   Hyperlipidemia 02/02/2007   HEMATURIA, MICROSCOPIC 02/02/2007   Essential hypertension 02/01/2007   CEREBROVASCULAR ACCIDENT, HX OF 02/01/2007    PCP: Abner Greenspan, MD  REFERRING PROVIDER: Leandrew Koyanagi, MD  REFERRING DIAG: [phone] (ICD-10-CM) - Status post total left knee replacement   THERAPY DIAG:  Stiffness of left knee, not elsewhere classified  Muscle weakness (generalized)  Acute pain of left knee  Unsteadiness on feet  Other abnormalities of gait and mobility  Localized edema  Rationale for Evaluation and Treatment: Rehabilitation  ONSET DATE: 06/18/2022 Left TKA  SUBJECTIVE:   SUBJECTIVE STATEMENT:  He has been to gym 2x  since last PT using weight machines & bike.  He is probably having hernia surgery.   PERTINENT HISTORY: OA, back pain, obesity, HTN, CVA, contact dermatitis  PAIN:  Are you having pain?  0/10  (he had to take meds & ice last night)  Pain location: L knee in general  Pain description: tight "like something is pulling on it", still numb too  Aggravating factors: bending, turning over in sleep Relieving factors: meds, ice, elevation, pillows between legs during sleep, CPM   PRECAUTIONS: None  WEIGHT BEARING RESTRICTIONS: No  FALLS:  Has patient fallen in last 6 months? No  LIVING ENVIRONMENT: Lives with: lives with their spouse and adult niece Lives in: House Stairs: Yes: Internal: 14 steps;  on left going up and External: 4 steps; on right going up  bathroom & bedroom main level, his fish aquarium is upstairs. 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  PATIENT GOALS:  return to work, household activities.  NEXT MD VISIT: 07/31/2022  OBJECTIVE:  DIAGNOSTIC FINDINGS: 06/18/2022 - Postsurgical changes from right knee arthroplasty. Evidence of periprosthetic fracture. No loosening. Overlying external material limits the ability to assess for soft tissue abnormality. Within this limitation no focal abnormality is visualized.  PATIENT SURVEYS:  EVAL: FOTO intake: 61%   predicted:  70% 08/18/2022: FOTO visit 11 72% 09/10/2022: FOTO 65%  EDEMA:  RLE: above knee 47.5cm  around knee 45cm  below knee 42cm LLE: above knee 56.3cm  around knee 55cm below knee 44cm  POSTURE: rounded shoulders, forward head, flexed trunk , and weight shift right  PALPATION: Tenderness along joint line & scar. Steri-strips in place. Small scabs still present on incision.   LOWER EXTREMITY ROM:   ROM Left eval Left  07/15/22 Left 07/20/22 Left 07/27/22 Left 07/30/22 Left 08/05/22 Left 08/18/22 Left 08/21/22 Left 08/25/22 Left 09/08/22  Left 09/15/22  Knee flexion Supine  P: 94* Seated P:111* A: 102* Seated P:111* A: 103* Seated P:113* A: 103* Seated P:120* A: 107*   Supine:  A: 118 P: 122 Supine A: 118 Sitting A: 116 Sitting A: 116  Knee extension Seated: LAQ A: -18* Supine  P: -6* A: quad set -9* Seated: LAQ A: -9* Supine  P: -3* A: quad set -4* Seated: LAQ A: -9* Supine  P: -3* A: quad set -4* Seated: LAQ A: -9* Standing A: TKE -3* Seated A: -4* Standing A: 0* Seated A: 0* LAQ Seated A: 0* LAQ Supine A: 0 Supine A:  Sitting A: 0 Sitting  A: 0   (Blank rows = not tested)  LOWER EXTREMITY MMT:  MMT Left eval  Right 1214/23 Left 07/30/22 Left 09/03/22 Left 09/15/22  Knee flexion 3-/5 HH dynameter 55.2# & 53.8# HH dynameter 30.2# & 27.4# LLE:RLE 52.8% HH dynameter 33.0# & 30.7# LLE:RLE 58.4% HHD 33.7#, 34.8#  Knee extension 3-/5 HH dynameter 69.3# & 68.7# HH dynameter 44.1# & 43.1# LLE:RLE 63.2% HH dynameter 50.6# & 52.7# LLE:RLE 74.9% HHD 53.0#, 60.1#   (Blank rows = not tested)  FUNCTIONAL TESTS:  18 inch chair transfer: minA using BUEs on armrests to RW 09/15/22: 5 time sit to stand: no UE support 16 seconds first attempt               5 time sit to stand: no UE support 12 seconds first attempt  GAIT: Distance walked: 80' Assistive device utilized: Environmental consultant - 2 wheeled Level of assistance:  SBA Comments: excessive weight bearing BUEs and PWB on LLE   TODAY'S TREATMENT                                                                           DATE:  09/23/2022: Therapeutic Exercise: Leg Press: 137# bil LE's x 25, Left LE only: 87# x 20,  Hamstring stretch seated LLE in 2nd chair strap DF 30 sec hold 2 reps Gastroc & Soleus stretch step heel depression 30 sec hold 2 reps ea.  Quad stretch standing knee flexion with towel 30 sec hold 2 reps Lunges between 2 chairs alternating lead LE 10 reps ea. Squat touching chair to increase depth 15 reps without band & 15 reps green theraband around knees Squat with side step with green theraband 10' right & left Knee ext machine 35# BLEs 15 reps;  15# BLEs concentric, single leg isometric/eccentric 5 reps 2 sets with ea LE;  10# single leg concentric & eccentric 5 reps 2 sets ea LE Knee flex machine 45# BLEs 20 reps;  20# single LLE 15 reps PT updated HEP with above including machines at gym. PT advised to discuss how long he needs to hold resistance exercises with surgeon for hernia.  Pt verbalized understanding.   09/16/2022: Therapeutic Exercise: Leg Press: 137# bil LE's x 25, Left LE only: 87# x 20,  Push / pull using wt stacks 40# total  (20 per stack) 5 reps ea with wt anterior & posterior - walking forward/backward ~5-6' Squat lift with pulley wt using bar 50# standing on 4" step for depth of squat 10 reps 2 sets. Knee ext machine 35# BLEs 15 reps;  15# BLEs concentric, single leg isometric/eccentric 5 reps 2 sets with ea LE;  10# single leg & eccentric 5 reps 2 sets ea LE Knee flex machine 45# BLEs 15 reps;  20# single LE 10 reps ea  PT instructed in above exercises & gym fitness recommendation for every other day.  On other days do home exercises.  Daily do stretches. Pt verbalized understanding.     09/15/2022: Therapeutic Exercise: Scifit bike: Level 4, x 8 minutes, full revolutions Leg Press: 137# bil LE's x 20, Left LE only: 87# x 20, attempted 100# but pt unable to press using Left LE only TRX lunge squat 2 x10 reps alternating LEs so BLEs Calf stretch on slant board straddle step: x 3 holding 30 sec Squat: using 10# kettle bell x 10 Neuromuscular Re-edu:  Stepping over 6 inch hurdles forward x 4  Stepping over 6 inch hurdles sidestepping x 4    HOME EXERCISE PROGRAM: Access Code: [email or web address] URL: [email or web address] Date: 09/23/2022 Prepared by: Jamey Reas  Exercises - Ankle Alphabet in Elevation  - 2-4 x daily - 7 x weekly - 1 sets - 1 reps - Quad Setting and Stretching  - 2-4 x daily - 7 x weekly - 5-10 sets - 10 reps - prop 5-10 minutes & quad set5 seconds hold - Supine Leg Press  - 2-4 x daily - 7 x weekly - 5-10 sets - 10 reps - 5 seconds hold - Supine Heel Slide with Strap  - 2-3 x daily - 7 x weekly - 2-3 sets - 10 reps -  5 seconds hold - Supine Straight Leg Raises  - 2-3 x daily - 7 x weekly - 2-3 sets - 10 reps - 5 seconds hold - Seated Knee Flexion Extension AROM   - 2-4 x daily - 7 x weekly - 2-3 sets - 10 reps - 5 seconds hold - Seated straight leg lifts  - 2-3 x daily - 7 x weekly - 2-3 sets - 10 reps - 5 seconds hold - Seated Hamstring Stretch with Strap  - 2-4 x daily - 7 x  weekly - 1 sets - 3 reps - 20-30 seconds hold - Seated Passive Knee Extension with Weight  - 1-3 x daily - 7 x weekly - 1 sets - 2-3 reps - 1-2 minutes hold - Seated Hamstring Stretch with Chair  - 1-3 x daily - 7 x weekly - 1 sets - 2-3 reps - 20-30 seconds hold - Gastroc Stretch on Step  - 1-3 x daily - 7 x weekly - 1 sets - 2-3 reps - 20-30 seconds hold - Supine Quadriceps Stretch with Strap on Table  - 1-3 x daily - 7 x weekly - 1 sets - 2-3 reps - 20-30 seconds hold - Standing Quad Stretch with Strap  - 1-3 x daily - 7 x weekly - 1 sets - 3 reps - 20-30 seconds hold - lunge between 2 chairs  - 1 x daily - 7 x weekly - 2 sets - 10 reps - 5 seconds hold - Squat with Chair Touch and Resistance Loop  - 1 x daily - 7 x weekly - 2 sets - 15 reps - 5 seconds hold - squat & side step with resistance at knees  - 1 x daily - 7 x weekly - 2 sets - 10 reps - 5 seconds holdne Quadriceps Stretch with Strap on Table  - 1-3 x daily - 7 x weekly - 1 sets - 2-3 reps - 20-30 seconds hold  ASSESSMENT: CLINICAL IMPRESSION: PT updated HEP to include stretches & functional strengthening activities that should help with return to work including passing CDC requirements. Pt appears to understand & reports he can tell how they will help.   OBJECTIVE IMPAIRMENTS: Abnormal gait, decreased activity tolerance, decreased balance, decreased endurance, decreased knowledge of condition, decreased knowledge of use of DME, decreased mobility, difficulty walking, decreased ROM, decreased strength, increased edema, increased muscle spasms, impaired flexibility, obesity, and pain.   ACTIVITY LIMITATIONS: carrying, lifting, bending, sitting, standing, squatting, sleeping, stairs, transfers, bed mobility, and locomotion level  PARTICIPATION LIMITATIONS: meal prep, cleaning, community activity, occupation, and yard work  PERSONAL FACTORS: 3+ comorbidities: see PMH  are also affecting patient's functional outcome.   REHAB  POTENTIAL: Good  CLINICAL DECISION MAKING: Stable/uncomplicated  EVALUATION COMPLEXITY: Low   GOALS: Goals reviewed with patient? Yes  UPDATED SHORT TERM GOALS: (target date 10/16/2022)   1.  Patient will demonstrate understanding of updated home exercise program including gym / fitness center program to progress. Goal status: ongoing 09/16/2022  2. Patient reports left knee pain 2/10 with above exercises / activities.  Goal Status:    3. Left knee PROM -3* ext to 100* flex  Goal Status:MET 08/05/22   UPDATED LONG TERM GOALS: (target dates for all long term goals are 8 weeks  11/13/2022 )   1. Patient will demonstrate/report pain at worst less than or equal to 2/10 to facilitate minimal limitation in daily activity secondary to pain symptoms.  Goal status: ongoing 09/16/2022   2. Patient will  demonstrate independent use of home exercise program to facilitate ability to maintain/progress functional gains from skilled physical therapy services.  Goal status: Ongoing 09/16/2022   3. Patient will demonstrate FOTO outcome > or = 70 % to indicate reduced disability due to condition.  Goal status:  MET 08/18/2022   4.  Patient will demonstrate left LE MMT 5/5 throughout to faciltiate usual transfers, stairs, squatting at [facility] for daily life.   Goal status: Ongoing 09/16/2022   5.  Patient will demonstrate ability to perform work task of lifting & pushing weighted cart.  Goal status: Ongoing 09/16/2022   6.  patient ambulates >500', negotiates ramps, curbs & stairs single rail independent without assistive device.  Goal status: MET 09/16/2022   7.  Left knee AROM 0* ext standing and 110* flexion seated. Goal Status: MET 09/08/22  PLAN:  PT FREQUENCY: 1x/week  PT DURATION: 8 weeks  PLANNED INTERVENTIONS: Therapeutic exercises, Therapeutic activity, Neuro Muscular re-education, Balance training, Gait training, Patient/Family education, Joint mobilization, Stair training, DME  instructions, Dry Needling, Electrical stimulation, Traction, Cryotherapy, vasopneumatic device, Moist heat, Taping, Ultrasound, Ionotophoresis '4mg'$ /ml Dexamethasone, and Manual therapy.  All included unless contraindicated  PLAN FOR NEXT SESSION:   check updated exercises on 2/7,  Continue skilled PT 1x/week instructing in HEP/gym/ activities, progressive strengthening exercises including functional exercises & some work simulation. Standing balance.     Jamey Reas, PT, DPT 09/23/2022, 8:48 AM

## 2022-09-25 NOTE — Telephone Encounter (Signed)
Spoke with patient. He wants these papers mailed to his address on file.

## 2022-09-25 NOTE — Telephone Encounter (Signed)
done 

## 2022-09-25 NOTE — Telephone Encounter (Signed)
Tried to call. No answer, and no voicemail. Will try again later.

## 2022-09-30 ENCOUNTER — Ambulatory Visit (INDEPENDENT_AMBULATORY_CARE_PROVIDER_SITE_OTHER): Payer: 59 | Admitting: Physical Therapy

## 2022-09-30 ENCOUNTER — Encounter: Payer: Self-pay | Admitting: Physical Therapy

## 2022-09-30 DIAGNOSIS — M25562 Pain in left knee: Secondary | ICD-10-CM | POA: Diagnosis not present

## 2022-09-30 DIAGNOSIS — R2689 Other abnormalities of gait and mobility: Secondary | ICD-10-CM

## 2022-09-30 DIAGNOSIS — M6281 Muscle weakness (generalized): Secondary | ICD-10-CM | POA: Diagnosis not present

## 2022-09-30 DIAGNOSIS — R2681 Unsteadiness on feet: Secondary | ICD-10-CM

## 2022-09-30 DIAGNOSIS — R600 Localized edema: Secondary | ICD-10-CM

## 2022-09-30 DIAGNOSIS — M25662 Stiffness of left knee, not elsewhere classified: Secondary | ICD-10-CM

## 2022-09-30 NOTE — Therapy (Signed)
OUTPATIENT PHYSICAL THERAPY LOWER EXTREMITY TREATMENT & RECERTIFICATION   Patient Name: GAITHER NUDING MRN: [id number] DOB:February 26, 1961, 62 y.o., male Today's Date: 09/30/2022   END OF SESSION:  PT End of Session - 09/30/22 0808     Visit Number 23    Number of Visits 29    Date for PT Re-Evaluation 11/13/22    Authorization Type [employer]    Authorization - Visit Number 12    Progress Note Due on Visit 22    PT Start Time 0803    PT Stop Time 0843    PT Time Calculation (min) 40 min    Activity Tolerance Patient tolerated treatment well    Behavior During Therapy [facility] for tasks assessed/performed                            Past Medical History:  Diagnosis Date   Contact dermatitis and other eczema due to plants (except food)    H/O: CVA (cardiovascular accident)    Carotid doppler neg (4/08)   Hematuria    Obesity, unspecified    Other and unspecified hyperlipidemia    Hx - no medication/diet controll and weight loss   Sacroiliitis, not elsewhere classified ([address])    Sleep apnea    uses CPAP but not every night   Unspecified essential hypertension    Varicose veins of left lower extremity    Past Surgical History:  Procedure Laterality Date   Carotid dopplers  11/16/2006   Negative   HERNIA REPAIR     umbilical   TOTAL KNEE ARTHROPLASTY Left 06/18/2022   TOTAL KNEE ARTHROPLASTY Left 06/18/2022   Procedure: LEFT TOTAL KNEE ARTHROPLASTY;  Surgeon: Leandrew Koyanagi, MD;  Location: [facility];  Service: Orthopedics;  Laterality: Left;   Patient Active Problem List   Diagnosis Date Noted   Status post total left knee replacement 06/18/2022   Primary osteoarthritis of left knee 01/01/2022   Primary osteoarthritis of right knee 01/01/2022   Left knee pain [id number]   Umbilical hernia [id number]   ED (erectile dysfunction) 12/31/2020   Insomnia 04/02/2020   Right ankle swelling 07/25/2019   Gout 11/03/2018   Chronic pain of right ankle 08/31/2018   Routine  general medical examination at a health care facility 11/01/2017   Prostate cancer screening 06/28/2017   Alopecia 11/04/2016   Back pain 05/06/2016   Sleep disorder 08/28/2015   Colon cancer screening 08/28/2015   Prediabetes 01/02/2013   Varicosities of leg 03/21/2012   Rectus diastasis of lower abdomen 03/21/2012   Obstructive sleep apnea 10/15/2009   Class 2 severe obesity due to excess calories with serious comorbidity and body mass index (BMI) of 38.0 to 38.9 in adult [facility]) 09/02/2009   Hyperlipidemia 02/02/2007   HEMATURIA, MICROSCOPIC 02/02/2007   Essential hypertension 02/01/2007   CEREBROVASCULAR ACCIDENT, HX OF 02/01/2007    PCP: Abner Greenspan, MD  REFERRING PROVIDER: Leandrew Koyanagi, MD  REFERRING DIAG: [phone] (ICD-10-CM) - Status post total left knee replacement   THERAPY DIAG:  Stiffness of left knee, not elsewhere classified  Muscle weakness (generalized)  Acute pain of left knee  Unsteadiness on feet  Other abnormalities of gait and mobility  Localized edema  Rationale for Evaluation and Treatment: Rehabilitation  ONSET DATE: 06/18/2022 Left TKA  SUBJECTIVE:   SUBJECTIVE STATEMENT: Pt stating he is scheduled to go back to work at the end of this month. Pt stating he is scheduled to have abdominal  hernia repair on 11/02/22 and he doesn't know how he is going to miss more work. Pt reporting mild left knee pain 1/10 today.   PERTINENT HISTORY: OA, back pain, obesity, HTN, CVA, contact dermatitis  PAIN:  Are you having pain?  1/10  Pain location: L knee in general  Pain description: tight "like something is pulling on it", still numb too  Aggravating factors: bending, turning over in sleep Relieving factors: meds, ice, elevation, pillows between legs during sleep, CPM   PRECAUTIONS: None  WEIGHT BEARING RESTRICTIONS: No  FALLS:  Has patient fallen in last 6 months? No  LIVING ENVIRONMENT: Lives with: lives with their spouse and adult niece Lives  in: House Stairs: Yes: Internal: 14 steps; on left going up and External: 4 steps; on right going up  bathroom & bedroom main level, his fish aquarium is upstairs. 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  PATIENT GOALS:  return to work, household activities.  NEXT MD VISIT: 07/31/2022  OBJECTIVE:  DIAGNOSTIC FINDINGS: 06/18/2022 - Postsurgical changes from right knee arthroplasty. Evidence of periprosthetic fracture. No loosening. Overlying external material limits the ability to assess for soft tissue abnormality. Within this limitation no focal abnormality is visualized.  PATIENT SURVEYS:  EVAL: FOTO intake: 61%   predicted:  70% 08/18/2022: FOTO visit 11 72% 09/10/2022: FOTO 65% 09/30/22: FOTO 79%  EDEMA:  RLE: above knee 47.5cm  around knee 45cm  below knee 42cm LLE: above knee 56.3cm  around knee 55cm below knee 44cm  POSTURE: rounded shoulders, forward head, flexed trunk , and weight shift right  PALPATION: Tenderness along joint line & scar. Steri-strips in place. Small scabs still present on incision.   LOWER EXTREMITY ROM:   ROM Left eval Left  07/15/22 Left 07/20/22 Left 07/27/22 Left 07/30/22 Left 08/05/22 Left 08/18/22 Left 08/21/22 Left 08/25/22 Left 09/08/22  Left 09/15/22  Knee flexion Supine  P: 94* Seated P:111* A: 102* Seated P:111* A: 103* Seated P:113* A: 103* Seated P:120* A: 107*   Supine:  A: 118 P: 122 Supine A: 118 Sitting A: 116 Sitting A: 116  Knee extension Seated: LAQ A: -18* Supine  P: -6* A: quad set -9* Seated: LAQ A: -9* Supine  P: -3* A: quad set -4* Seated: LAQ A: -9* Supine  P: -3* A: quad set -4* Seated: LAQ A: -9* Standing A: TKE -3* Seated A: -4* Standing A: 0* Seated A: 0* LAQ Seated A: 0* LAQ Supine A: 0 Supine A:  Sitting A: 0 Sitting  A: 0   (Blank rows = not  tested)  LOWER EXTREMITY MMT:  MMT Left eval Right 1214/23 Left 07/30/22 Left 09/03/22 Left 09/15/22  Knee flexion 3-/5 HH dynameter 55.2# & 53.8# HH dynameter 30.2# & 27.4# LLE:RLE 52.8% HH dynameter 33.0# & 30.7# LLE:RLE 58.4% HHD 33.7#, 34.8#  Knee extension 3-/5 HH dynameter 69.3# & 68.7# HH dynameter 44.1# & 43.1# LLE:RLE 63.2% HH dynameter 50.6# & 52.7# LLE:RLE 74.9% HHD 53.0#, 60.1#   (Blank rows = not tested)  FUNCTIONAL TESTS:  18 inch chair transfer: minA using BUEs on armrests to RW 09/15/22: 5 time sit to stand: no UE support 16 seconds first attempt               5 time sit to stand: no UE support 12 seconds first attempt  GAIT: Distance walked:  63' Assistive device utilized: Environmental consultant - 2 wheeled Level of assistance: SBA Comments: excessive weight bearing BUEs and PWB on LLE   TODAY'S TREATMENT                                                                           DATE:  09/30/2022: Therapeutic Exercise: Scifit bike: Level 4, x 8 minutes, full revolutions Leg Press: 137# bil LE's x 20, Left LE only: 87# x 20 Squat: using 10# kettle bell x 10 Neuromuscular Re-edu:  Stepping over 9 inch hurdles forward x 4  Stepping over 9 inch hurdles sidestepping x 4  Pushing cart with 25#, 50#, 75#, 100# all 60 feet x 2 Tandem walking 6 feet x 4 no UE support  09/23/2022: Therapeutic Exercise: Leg Press: 137# bil LE's x 25, Left LE only: 87# x 20,  Hamstring stretch seated LLE in 2nd chair strap DF 30 sec hold 2 reps Gastroc & Soleus stretch step heel depression 30 sec hold 2 reps ea.  Quad stretch standing knee flexion with towel 30 sec hold 2 reps Lunges between 2 chairs alternating lead LE 10 reps ea. Squat touching chair to increase depth 15 reps without band & 15 reps green theraband around knees Squat with side step with green theraband 10' right & left Knee ext machine 35# BLEs 15 reps;  15# BLEs concentric, single leg isometric/eccentric 5 reps 2 sets with  ea LE;  10# single leg concentric & eccentric 5 reps 2 sets ea LE Knee flex machine 45# BLEs 20 reps;  20# single LLE 15 reps PT updated HEP with above including machines at gym. PT advised to discuss how long he needs to hold resistance exercises with surgeon for hernia.  Pt verbalized understanding.   09/16/2022: Therapeutic Exercise: Leg Press: 137# bil LE's x 25, Left LE only: 87# x 20,  Push / pull using wt stacks 40# total (20 per stack) 5 reps ea with wt anterior & posterior - walking forward/backward ~5-6' Squat lift with pulley wt using bar 50# standing on 4" step for depth of squat 10 reps 2 sets. Knee ext machine 35# BLEs 15 reps;  15# BLEs concentric, single leg isometric/eccentric 5 reps 2 sets with ea LE;  10# single leg & eccentric 5 reps 2 sets ea LE Knee flex machine 45# BLEs 15 reps;  20# single LE 10 reps ea  PT instructed in above exercises & gym fitness recommendation for every other day.  On other days do home exercises.  Daily do stretches. Pt verbalized understanding.     09/15/2022: Therapeutic Exercise: Scifit bike: Level 4, x 8 minutes, full revolutions Leg Press: 137# bil LE's x 20, Left LE only: 87# x 20, attempted 100# but pt unable to press using Left LE only TRX lunge squat 2 x10 reps alternating LEs so BLEs Calf stretch on slant board straddle step: x 3 holding 30 sec Squat: using 10# kettle bell x 10 Neuromuscular Re-edu:  Stepping over 6 inch hurdles forward x 4  Stepping over 6 inch hurdles sidestepping x 4    HOME EXERCISE PROGRAM: Access Code: [email or web address] URL: [email or web address] Date: 09/23/2022 Prepared by: Jamey Reas  Exercises - Ankle Alphabet in Elevation  -  2-4 x daily - 7 x weekly - 1 sets - 1 reps - Quad Setting and Stretching  - 2-4 x daily - 7 x weekly - 5-10 sets - 10 reps - prop 5-10 minutes & quad set5 seconds hold - Supine Leg Press  - 2-4 x daily - 7 x weekly - 5-10 sets - 10 reps - 5 seconds hold - Supine Heel  Slide with Strap  - 2-3 x daily - 7 x weekly - 2-3 sets - 10 reps - 5 seconds hold - Supine Straight Leg Raises  - 2-3 x daily - 7 x weekly - 2-3 sets - 10 reps - 5 seconds hold - Seated Knee Flexion Extension AROM   - 2-4 x daily - 7 x weekly - 2-3 sets - 10 reps - 5 seconds hold - Seated straight leg lifts  - 2-3 x daily - 7 x weekly - 2-3 sets - 10 reps - 5 seconds hold - Seated Hamstring Stretch with Strap  - 2-4 x daily - 7 x weekly - 1 sets - 3 reps - 20-30 seconds hold - Seated Passive Knee Extension with Weight  - 1-3 x daily - 7 x weekly - 1 sets - 2-3 reps - 1-2 minutes hold - Seated Hamstring Stretch with Chair  - 1-3 x daily - 7 x weekly - 1 sets - 2-3 reps - 20-30 seconds hold - Gastroc Stretch on Step  - 1-3 x daily - 7 x weekly - 1 sets - 2-3 reps - 20-30 seconds hold - Supine Quadriceps Stretch with Strap on Table  - 1-3 x daily - 7 x weekly - 1 sets - 2-3 reps - 20-30 seconds hold - Standing Quad Stretch with Strap  - 1-3 x daily - 7 x weekly - 1 sets - 3 reps - 20-30 seconds hold - lunge between 2 chairs  - 1 x daily - 7 x weekly - 2 sets - 10 reps - 5 seconds hold - Squat with Chair Touch and Resistance Loop  - 1 x daily - 7 x weekly - 2 sets - 15 reps - 5 seconds hold - squat & side step with resistance at knees  - 1 x daily - 7 x weekly - 2 sets - 10 reps - 5 seconds holdne Quadriceps Stretch with Strap on Table  - 1-3 x daily - 7 x weekly - 1 sets - 2-3 reps - 20-30 seconds hold  ASSESSMENT: CLINICAL IMPRESSION: Pt stating his new updated HEP is going well. Pt tolerating strengthening and balance exercises well. Pt with improved FOTO score to 79%. Pt still working on full squats and lifting from floor. Continue skilled PT to maximize pt's function.   OBJECTIVE IMPAIRMENTS: Abnormal gait, decreased activity tolerance, decreased balance, decreased endurance, decreased knowledge of condition, decreased knowledge of use of DME, decreased mobility, difficulty walking, decreased  ROM, decreased strength, increased edema, increased muscle spasms, impaired flexibility, obesity, and pain.   ACTIVITY LIMITATIONS: carrying, lifting, bending, sitting, standing, squatting, sleeping, stairs, transfers, bed mobility, and locomotion level  PARTICIPATION LIMITATIONS: meal prep, cleaning, community activity, occupation, and yard work  PERSONAL FACTORS: 3+ comorbidities: see PMH  are also affecting patient's functional outcome.   REHAB POTENTIAL: Good  CLINICAL DECISION MAKING: Stable/uncomplicated  EVALUATION COMPLEXITY: Low   GOALS: Goals reviewed with patient? Yes  UPDATED SHORT TERM GOALS: (target date 10/16/2022)   1.  Patient will demonstrate understanding of updated home exercise program including gym / fitness center program  to progress. Goal status: ongoing 09/16/2022  2. Patient reports left knee pain 2/10 with above exercises / activities.  Goal Status:    3. Left knee PROM -3* ext to 100* flex  Goal Status:MET 08/05/22   UPDATED LONG TERM GOALS: (target dates for all long term goals are 8 weeks  11/13/2022 )   1. Patient will demonstrate/report pain at worst less than or equal to 2/10 to facilitate minimal limitation in daily activity secondary to pain symptoms.  Goal status: ongoing 09/16/2022   2. Patient will demonstrate independent use of home exercise program to facilitate ability to maintain/progress functional gains from skilled physical therapy services.  Goal status: Ongoing 09/16/2022   3. Patient will demonstrate FOTO outcome > or = 70 % to indicate reduced disability due to condition.  Goal status:  MET 08/18/2022   4.  Patient will demonstrate left LE MMT 5/5 throughout to faciltiate usual transfers, stairs, squatting at [facility] for daily life.   Goal status: Ongoing 09/16/2022   5.  Patient will demonstrate ability to perform work task of lifting & pushing weighted cart.  Goal status: Partially met  09/30/2022 (pt able to push cart with 75# on  level clinic floor)   6.  patient ambulates >500', negotiates ramps, curbs & stairs single rail independent without assistive device.  Goal status: MET 09/16/2022   7.  Left knee AROM 0* ext standing and 110* flexion seated. Goal Status: MET 09/08/22  PLAN:  PT FREQUENCY: 1x/week  PT DURATION: 8 weeks  PLANNED INTERVENTIONS: Therapeutic exercises, Therapeutic activity, Neuro Muscular re-education, Balance training, Gait training, Patient/Family education, Joint mobilization, Stair training, DME instructions, Dry Needling, Electrical stimulation, Traction, Cryotherapy, vasopneumatic device, Moist heat, Taping, Ultrasound, Ionotophoresis 47m/ml Dexamethasone, and Manual therapy.  All included unless contraindicated  PLAN FOR NEXT SESSION:   Continue skilled PT 1x/week instructing in HEP/gym/ activities, progressive strengthening exercises including functional exercises & some work simulation. Standing balance.     JOretha Caprice PT, MPT 09/30/2022, 8:26 AM

## 2022-10-07 ENCOUNTER — Encounter: Payer: Self-pay | Admitting: Physical Therapy

## 2022-10-07 ENCOUNTER — Ambulatory Visit (INDEPENDENT_AMBULATORY_CARE_PROVIDER_SITE_OTHER): Payer: 59 | Admitting: Physical Therapy

## 2022-10-07 ENCOUNTER — Telehealth: Payer: Self-pay

## 2022-10-07 DIAGNOSIS — M25662 Stiffness of left knee, not elsewhere classified: Secondary | ICD-10-CM

## 2022-10-07 DIAGNOSIS — R2681 Unsteadiness on feet: Secondary | ICD-10-CM

## 2022-10-07 DIAGNOSIS — M25562 Pain in left knee: Secondary | ICD-10-CM | POA: Diagnosis not present

## 2022-10-07 DIAGNOSIS — M6281 Muscle weakness (generalized): Secondary | ICD-10-CM

## 2022-10-07 DIAGNOSIS — R600 Localized edema: Secondary | ICD-10-CM

## 2022-10-07 DIAGNOSIS — R2689 Other abnormalities of gait and mobility: Secondary | ICD-10-CM

## 2022-10-07 NOTE — Telephone Encounter (Signed)
Yes he can return with no restrictions

## 2022-10-07 NOTE — Therapy (Signed)
OUTPATIENT PHYSICAL THERAPY LOWER EXTREMITY TREATMENT & RECERTIFICATION   Patient Name: Sean Lee MRN: [id number] DOB:[birth year], 62 y.o., male Today's Date: 10/07/2022   END OF SESSION:  PT End of Session - 10/07/22 0834     Visit Number 24    Number of Visits 29    Date for PT Re-Evaluation 11/13/22    Authorization Type [employer]    Authorization - Number of Visits 50    Progress Note Due on Visit 30    PT Start Time 0800    PT Stop Time 0840    PT Time Calculation (min) 40 min    Activity Tolerance Patient tolerated treatment well    Behavior During Therapy WFL for tasks assessed/performed                             Past Medical History:  Diagnosis Date   Contact dermatitis and other eczema due to plants (except food)    H/O: CVA (cardiovascular accident)    Carotid doppler neg (4/08)   Hematuria    Obesity, unspecified    Other and unspecified hyperlipidemia    Hx - no medication/diet controll and weight loss   Sacroiliitis, not elsewhere classified ([place])    Sleep apnea    uses CPAP but not every night   Unspecified essential hypertension    Varicose veins of left lower extremity    Past Surgical History:  Procedure Laterality Date   Carotid dopplers  11/16/2006   Negative   HERNIA REPAIR     umbilical   TOTAL KNEE ARTHROPLASTY Left 06/18/2022   TOTAL KNEE ARTHROPLASTY Left 06/18/2022   Procedure: LEFT TOTAL KNEE ARTHROPLASTY;  Surgeon: Leandrew Koyanagi, MD;  Location: [facility];  Service: Orthopedics;  Laterality: Left;   Patient Active Problem List   Diagnosis Date Noted   Status post total left knee replacement 06/18/2022   Primary osteoarthritis of left knee 01/01/2022   Primary osteoarthritis of right knee 01/01/2022   Left knee pain [id number]   Umbilical hernia [id number]   ED (erectile dysfunction) 12/31/2020   Insomnia 04/02/2020   Right ankle swelling 07/25/2019   Gout 11/03/2018   Chronic pain of right ankle 08/31/2018    Routine general medical examination at a health care facility 11/01/2017   Prostate cancer screening 06/28/2017   Alopecia 11/04/2016   Back pain 05/06/2016   Sleep disorder 08/28/2015   Colon cancer screening 08/28/2015   Prediabetes 01/02/2013   Varicosities of leg 03/21/2012   Rectus diastasis of lower abdomen 03/21/2012   Obstructive sleep apnea 10/15/2009   Class 2 severe obesity due to excess calories with serious comorbidity and body mass index (BMI) of 38.0 to 38.9 in adult [facility]) 09/02/2009   Hyperlipidemia 02/02/2007   HEMATURIA, MICROSCOPIC 02/02/2007   Essential hypertension 02/01/2007   CEREBROVASCULAR ACCIDENT, HX OF 02/01/2007    PCP: Abner Greenspan, MD  REFERRING PROVIDER: Leandrew Koyanagi, MD  REFERRING DIAG: [phone] (ICD-10-CM) - Status post total left knee replacement   THERAPY DIAG:  Stiffness of left knee, not elsewhere classified  Muscle weakness (generalized)  Acute pain of left knee  Unsteadiness on feet  Other abnormalities of gait and mobility  Localized edema  Rationale for Evaluation and Treatment: Rehabilitation  ONSET DATE: 06/18/2022 Left TKA  SUBJECTIVE:   SUBJECTIVE STATEMENT: Pt stating no pain at rest.   PERTINENT HISTORY: OA, back pain, obesity, HTN, CVA, contact dermatitis  PAIN:  Are  you having pain? No pain Pain location: L knee in general  Pain description: tight "like something is pulling on it", still numb too  Aggravating factors: bending, turning over in sleep Relieving factors: meds, ice, elevation, pillows between legs during sleep, CPM   PRECAUTIONS: None  WEIGHT BEARING RESTRICTIONS: No  FALLS:  Has patient fallen in last 6 months? No  LIVING ENVIRONMENT: Lives with: lives with their spouse and adult niece Lives in: House Stairs: Yes: Internal: 14 steps; on left going up and External: 4 steps; on right going up  bathroom & bedroom main level, his fish aquarium is upstairs. 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  PATIENT GOALS:  return to work, household activities.  NEXT MD VISIT: 07/31/2022  OBJECTIVE:  DIAGNOSTIC FINDINGS: 06/18/2022 - Postsurgical changes from right knee arthroplasty. Evidence of periprosthetic fracture. No loosening. Overlying external material limits the ability to assess for soft tissue abnormality. Within this limitation no focal abnormality is visualized.  PATIENT SURVEYS:  EVAL: FOTO intake: 61%   predicted:  70% 08/18/2022: FOTO visit 11 72% 09/10/2022: FOTO 65% 09/30/22: FOTO 79%  EDEMA:  RLE: above knee 47.5cm  around knee 45cm  below knee 42cm LLE: above knee 56.3cm  around knee 55cm below knee 44cm  POSTURE: rounded shoulders, forward head, flexed trunk , and weight shift right  PALPATION: Tenderness along joint line & scar. Steri-strips in place. Small scabs still present on incision.   LOWER EXTREMITY ROM:   ROM Left eval Left  07/15/22 Left 07/20/22 Left 07/27/22 Left 07/30/22 Left 08/05/22 Left 08/18/22 Left 08/21/22 Left 08/25/22 Left 09/08/22  Left 09/15/22  Knee flexion Supine  P: 94* Seated P:111* A: 102* Seated P:111* A: 103* Seated P:113* A: 103* Seated P:120* A: 107*   Supine:  A: 118 P: 122 Supine A: 118 Sitting A: 116 Sitting A: 116  Knee extension Seated: LAQ A: -18* Supine  P: -6* A: quad set -9* Seated: LAQ A: -9* Supine  P: -3* A: quad set -4* Seated: LAQ A: -9* Supine  P: -3* A: quad set -4* Seated: LAQ A: -9* Standing A: TKE -3* Seated A: -4* Standing A: 0* Seated A: 0* LAQ Seated A: 0* LAQ Supine A: 0 Supine A:  Sitting A: 0 Sitting  A: 0   (Blank rows = not tested)  LOWER EXTREMITY MMT:  MMT Left eval Right 1214/23 Left 07/30/22 Left 09/03/22 Left 09/15/22 Left 10/07/22  Knee flexion 3-/5 HH dynameter 55.2# & 53.8# HH dynameter 30.2# &  27.4# LLE:RLE 52.8% HH dynameter 33.0# & 30.7# LLE:RLE 58.4% HHD 33.7#, 34.8# HHD 55.9#  Knee extension 3-/5 HH dynameter 69.3# & 68.7# HH dynameter 44.1# & 43.1# LLE:RLE 63.2% HH dynameter 50.6# & 52.7# LLE:RLE 74.9% HHD 53.0#, 60.1# HHD 60.7#   (Blank rows = not tested)  FUNCTIONAL TESTS:  18 inch chair transfer: minA using BUEs on armrests to RW 09/15/22: 5 time sit to stand: no UE support 16 seconds first attempt               5 time sit to stand: no UE support 12 seconds first attempt 10/07/22: 5 time sit to stand: no UE support 10 seconds first attempt  GAIT: Distance walked: 40' Assistive device utilized: Environmental consultant - 2 wheeled Level of assistance: SBA Comments: excessive weight bearing BUEs and PWB on LLE  TODAY'S TREATMENT                                                                           DATE:  10/07/22:  Therapeutic Exercise: Recumbent bike: Level 4, x 8 minutes, full revolutions (seat 8) Leg Press: 150# bil LE's 2 x 10, Left LE only: 87# x 20 Squat: using 20# kettle bell x 10 Leg Curl: Left LE only 2 x 10 25# Leg Extension: both LE lifting and left Lowering 20# Neuromuscular Re-edu:  Pushing cart with 120# x 60 feet x 2 Stair climbing 1 flight single hand rail x 2 c step over step pattern Lifting 20# from counter to 1st shelf x 5 Lifting 20# from floor to counter with step turn  09/30/2022: Therapeutic Exercise: Scifit bike: Level 4, x 8 minutes, full revolutions Leg Press: 137# bil LE's x 20, Left LE only: 87# x 20 Squat: using 10# kettle bell x 10 Neuromuscular Re-edu:  Stepping over 9 inch hurdles forward x 4  Stepping over 9 inch hurdles sidestepping x 4  Pushing cart with 25#, 50#, 75#, 100# all 60 feet x 2 Tandem walking 6 feet x 4 no UE support  09/23/2022: Therapeutic Exercise: Leg Press: 137# bil LE's x 25, Left LE only: 87# x 20,  Hamstring stretch seated LLE in 2nd chair strap DF 30 sec hold 2 reps Gastroc & Soleus stretch step heel  depression 30 sec hold 2 reps ea.  Quad stretch standing knee flexion with towel 30 sec hold 2 reps Lunges between 2 chairs alternating lead LE 10 reps ea. Squat touching chair to increase depth 15 reps without band & 15 reps green theraband around knees Squat with side step with green theraband 10' right & left Knee ext machine 35# BLEs 15 reps;  15# BLEs concentric, single leg isometric/eccentric 5 reps 2 sets with ea LE;  10# single leg concentric & eccentric 5 reps 2 sets ea LE Knee flex machine 45# BLEs 20 reps;  20# single LLE 15 reps PT updated HEP with above including machines at gym. PT advised to discuss how long he needs to hold resistance exercises with surgeon for hernia.  Pt verbalized understanding.   09/16/2022: Therapeutic Exercise: Leg Press: 137# bil LE's x 25, Left LE only: 87# x 20,  Push / pull using wt stacks 40# total (20 per stack) 5 reps ea with wt anterior & posterior - walking forward/backward ~5-6' Squat lift with pulley wt using bar 50# standing on 4" step for depth of squat 10 reps 2 sets. Knee ext machine 35# BLEs 15 reps;  15# BLEs concentric, single leg isometric/eccentric 5 reps 2 sets with ea LE;  10# single leg & eccentric 5 reps 2 sets ea LE Knee flex machine 45# BLEs 15 reps;  20# single LE 10 reps ea  PT instructed in above exercises & gym fitness recommendation for every other day.  On other days do home exercises.  Daily do stretches. Pt verbalized understanding.     09/15/2022: Therapeutic Exercise: Scifit bike: Level 4, x 8 minutes, full revolutions Leg Press: 137# bil LE's x 20, Left LE only: 87# x 20, attempted 100# but pt unable to press using Left LE  only TRX lunge squat 2 x10 reps alternating LEs so BLEs Calf stretch on slant board straddle step: x 3 holding 30 sec Squat: using 10# kettle bell x 10 Neuromuscular Re-edu:  Stepping over 6 inch hurdles forward x 4  Stepping over 6 inch hurdles sidestepping x 4    HOME EXERCISE  PROGRAM: Access Code: [email or web address] URL: [email or web address] Date: 09/23/2022 Prepared by: Jamey Reas  Exercises - Ankle Alphabet in Elevation  - 2-4 x daily - 7 x weekly - 1 sets - 1 reps - Quad Setting and Stretching  - 2-4 x daily - 7 x weekly - 5-10 sets - 10 reps - prop 5-10 minutes & quad set5 seconds hold - Supine Leg Press  - 2-4 x daily - 7 x weekly - 5-10 sets - 10 reps - 5 seconds hold - Supine Heel Slide with Strap  - 2-3 x daily - 7 x weekly - 2-3 sets - 10 reps - 5 seconds hold - Supine Straight Leg Raises  - 2-3 x daily - 7 x weekly - 2-3 sets - 10 reps - 5 seconds hold - Seated Knee Flexion Extension AROM   - 2-4 x daily - 7 x weekly - 2-3 sets - 10 reps - 5 seconds hold - Seated straight leg lifts  - 2-3 x daily - 7 x weekly - 2-3 sets - 10 reps - 5 seconds hold - Seated Hamstring Stretch with Strap  - 2-4 x daily - 7 x weekly - 1 sets - 3 reps - 20-30 seconds hold - Seated Passive Knee Extension with Weight  - 1-3 x daily - 7 x weekly - 1 sets - 2-3 reps - 1-2 minutes hold - Seated Hamstring Stretch with Chair  - 1-3 x daily - 7 x weekly - 1 sets - 2-3 reps - 20-30 seconds hold - Gastroc Stretch on Step  - 1-3 x daily - 7 x weekly - 1 sets - 2-3 reps - 20-30 seconds hold - Supine Quadriceps Stretch with Strap on Table  - 1-3 x daily - 7 x weekly - 1 sets - 2-3 reps - 20-30 seconds hold - Standing Quad Stretch with Strap  - 1-3 x daily - 7 x weekly - 1 sets - 3 reps - 20-30 seconds hold - lunge between 2 chairs  - 1 x daily - 7 x weekly - 2 sets - 10 reps - 5 seconds hold - Squat with Chair Touch and Resistance Loop  - 1 x daily - 7 x weekly - 2 sets - 15 reps - 5 seconds hold - squat & side step with resistance at knees  - 1 x daily - 7 x weekly - 2 sets - 10 reps - 5 seconds holdne Quadriceps Stretch with Strap on Table  - 1-3 x daily - 7 x weekly - 1 sets - 2-3 reps - 20-30 seconds hold  ASSESSMENT: CLINICAL IMPRESSION: Pt presenting with no pain upon  arrival. Pt stating he was able to bend down and get his remote from under his bed. Pt continuing to make progress toward his LTG's. Continue with skilled PT interventions.   OBJECTIVE IMPAIRMENTS: Abnormal gait, decreased activity tolerance, decreased balance, decreased endurance, decreased knowledge of condition, decreased knowledge of use of DME, decreased mobility, difficulty walking, decreased ROM, decreased strength, increased edema, increased muscle spasms, impaired flexibility, obesity, and pain.   ACTIVITY LIMITATIONS: carrying, lifting, bending, sitting, standing, squatting, sleeping, stairs, transfers, bed mobility, and locomotion level  PARTICIPATION LIMITATIONS:  meal prep, cleaning, community activity, occupation, and yard work  PERSONAL FACTORS: 3+ comorbidities: see PMH  are also affecting patient's functional outcome.   REHAB POTENTIAL: Good  CLINICAL DECISION MAKING: Stable/uncomplicated  EVALUATION COMPLEXITY: Low   GOALS: Goals reviewed with patient? Yes  UPDATED SHORT TERM GOALS: (target date 10/16/2022)   1.  Patient will demonstrate understanding of updated home exercise program including gym / fitness center program to progress. Goal status: ongoing 09/16/2022  2. Patient reports left knee pain 2/10 with above exercises / activities.  Goal Status:  MET   3. Left knee PROM -3* ext to 100* flex  Goal Status:MET 08/05/22   UPDATED LONG TERM GOALS: (target dates for all long term goals are 8 weeks  11/13/2022 )   1. Patient will demonstrate/report pain at worst less than or equal to 2/10 to facilitate minimal limitation in daily activity secondary to pain symptoms.  Goal status: MET 10/07/22 (most days)   2. Patient will demonstrate independent use of home exercise program to facilitate ability to maintain/progress functional gains from skilled physical therapy services.  Goal status: Ongoing 10/07/2022   3. Patient will demonstrate FOTO outcome > or = 70 % to  indicate reduced disability due to condition.  Goal status:  MET 08/18/2022   4.  Patient will demonstrate left LE MMT 5/5 throughout to faciltiate usual transfers, stairs, squatting at [facility] for daily life.   Goal status: Ongoing 09/16/2022   5.  Patient will demonstrate ability to perform work task of lifting & pushing weighted cart.  Goal status: Partially met  09/30/2022 (pt able to push cart with 75# on level clinic floor)   6.  patient ambulates >500', negotiates ramps, curbs & stairs single rail independent without assistive device.  Goal status: MET 09/16/2022   7.  Left knee AROM 0* ext standing and 110* flexion seated. Goal Status: MET 09/08/22  PLAN:  PT FREQUENCY: 1x/week  PT DURATION: 8 weeks  PLANNED INTERVENTIONS: Therapeutic exercises, Therapeutic activity, Neuro Muscular re-education, Balance training, Gait training, Patient/Family education, Joint mobilization, Stair training, DME instructions, Dry Needling, Electrical stimulation, Traction, Cryotherapy, vasopneumatic device, Moist heat, Taping, Ultrasound, Ionotophoresis 31m/ml Dexamethasone, and Manual therapy.  All included unless contraindicated  PLAN FOR NEXT SESSION:   Continue skilled PT 1x/week instructing in HEP/gym/ activities, progressive strengthening exercises including functional exercises & some work simulation. Standing balance.     JOretha Caprice PT, MPT 10/07/2022, 8:37 AM

## 2022-10-07 NOTE — Telephone Encounter (Signed)
Patient messaged clinic. Can he return to work with no restrictions or does he need a follow up to assess?

## 2022-10-08 NOTE — Telephone Encounter (Signed)
Tried to call patient. Call would not go through. I have sent patient a MyChart message asking what day he would like to return and I can then place note up front for pick up.

## 2022-10-14 ENCOUNTER — Encounter: Payer: Self-pay | Admitting: Physical Therapy

## 2022-10-14 ENCOUNTER — Ambulatory Visit (INDEPENDENT_AMBULATORY_CARE_PROVIDER_SITE_OTHER): Payer: 59 | Admitting: Physical Therapy

## 2022-10-14 DIAGNOSIS — M25562 Pain in left knee: Secondary | ICD-10-CM | POA: Diagnosis not present

## 2022-10-14 DIAGNOSIS — R2681 Unsteadiness on feet: Secondary | ICD-10-CM | POA: Diagnosis not present

## 2022-10-14 DIAGNOSIS — M25662 Stiffness of left knee, not elsewhere classified: Secondary | ICD-10-CM

## 2022-10-14 DIAGNOSIS — M6281 Muscle weakness (generalized): Secondary | ICD-10-CM | POA: Diagnosis not present

## 2022-10-14 DIAGNOSIS — R600 Localized edema: Secondary | ICD-10-CM

## 2022-10-14 DIAGNOSIS — R2689 Other abnormalities of gait and mobility: Secondary | ICD-10-CM

## 2022-10-14 NOTE — Therapy (Signed)
OUTPATIENT PHYSICAL THERAPY LOWER EXTREMITY TREATMENT & RECERTIFICATION Discharge   Patient Name: Sean Lee MRN: [id number] DOB:06/11/1961, 62 y.o., male Today's Date: 10/14/2022   END OF SESSION:  PT End of Session - 10/14/22 0812     Visit Number 25    Number of Visits 29    Authorization Type [employer]    Authorization Time Period 20% co-insurance    Authorization - Number of Visits 50    Progress Note Due on Visit 3    PT Start Time 0804    PT Stop Time 0842    PT Time Calculation (min) 38 min    Activity Tolerance Patient tolerated treatment well    Behavior During Therapy WFL for tasks assessed/performed                             Past Medical History:  Diagnosis Date   Contact dermatitis and other eczema due to plants (except food)    H/O: CVA (cardiovascular accident)    Carotid doppler neg (4/08)   Hematuria    Obesity, unspecified    Other and unspecified hyperlipidemia    Hx - no medication/diet controll and weight loss   Sacroiliitis, not elsewhere classified ([place])    Sleep apnea    uses CPAP but not every night   Unspecified essential hypertension    Varicose veins of left lower extremity    Past Surgical History:  Procedure Laterality Date   Carotid dopplers  11/16/2006   Negative   HERNIA REPAIR     umbilical   TOTAL KNEE ARTHROPLASTY Left 06/18/2022   TOTAL KNEE ARTHROPLASTY Left 06/18/2022   Procedure: LEFT TOTAL KNEE ARTHROPLASTY;  Surgeon: Leandrew Koyanagi, MD;  Location: [facility];  Service: Orthopedics;  Laterality: Left;   Patient Active Problem List   Diagnosis Date Noted   Status post total left knee replacement 06/18/2022   Primary osteoarthritis of left knee 01/01/2022   Primary osteoarthritis of right knee 01/01/2022   Left knee pain [id number]   Umbilical hernia [id number]   ED (erectile dysfunction) 12/31/2020   Insomnia 04/02/2020   Right ankle swelling 07/25/2019   Gout 11/03/2018   Chronic pain of right ankle  08/31/2018   Routine general medical examination at a health care facility 11/01/2017   Prostate cancer screening 06/28/2017   Alopecia 11/04/2016   Back pain 05/06/2016   Sleep disorder 08/28/2015   Colon cancer screening 08/28/2015   Prediabetes 01/02/2013   Varicosities of leg 03/21/2012   Rectus diastasis of lower abdomen 03/21/2012   Obstructive sleep apnea 10/15/2009   Class 2 severe obesity due to excess calories with serious comorbidity and body mass index (BMI) of 38.0 to 38.9 in adult [facility]) 09/02/2009   Hyperlipidemia 02/02/2007   HEMATURIA, MICROSCOPIC 02/02/2007   Essential hypertension 02/01/2007   CEREBROVASCULAR ACCIDENT, HX OF 02/01/2007    PCP: Abner Greenspan, MD  REFERRING PROVIDER: Leandrew Koyanagi, MD  REFERRING DIAG: [phone] (ICD-10-CM) - Status post total left knee replacement   THERAPY DIAG:  Stiffness of left knee, not elsewhere classified  Muscle weakness (generalized)  Acute pain of left knee  Unsteadiness on feet  Other abnormalities of gait and mobility  Localized edema  Rationale for Evaluation and Treatment: Rehabilitation  ONSET DATE: 06/18/2022 Left TKA  SUBJECTIVE:   SUBJECTIVE STATEMENT:  Pt stating he returns to work on 10/26/22. Pt is ready for discharge.   PERTINENT HISTORY: OA, back pain,  obesity, HTN, CVA, contact dermatitis  PAIN:  Are you having pain? No pain Pain location: L knee in general  Pain description: tight "like something is pulling on it", still numb too  Aggravating factors: bending, turning over in sleep Relieving factors: meds, ice, elevation, pillows between legs during sleep, CPM   PRECAUTIONS: None  WEIGHT BEARING RESTRICTIONS: No  FALLS:  Has patient fallen in last 6 months? No  LIVING ENVIRONMENT: Lives with: lives with their spouse and adult niece Lives in: House Stairs: Yes: Internal: 14 steps; on left going up and External: 4 steps; on right going up  bathroom & bedroom main level, his fish  aquarium is upstairs. 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  PATIENT GOALS:  return to work, household activities.  NEXT MD VISIT: 07/31/2022  OBJECTIVE:  DIAGNOSTIC FINDINGS: 06/18/2022 - Postsurgical changes from right knee arthroplasty. Evidence of periprosthetic fracture. No loosening. Overlying external material limits the ability to assess for soft tissue abnormality. Within this limitation no focal abnormality is visualized.  PATIENT SURVEYS:  EVAL: FOTO intake: 61%   predicted:  70% 08/18/2022: FOTO visit 11 72% 09/10/2022: FOTO 65% 09/30/22: FOTO 79% 10/14/22: FOTO 82%  EDEMA:  RLE: above knee 47.5cm  around knee 45cm  below knee 42cm LLE: above knee 56.3cm  around knee 55cm below knee 44cm  POSTURE: rounded shoulders, forward head, flexed trunk , and weight shift right  PALPATION: Tenderness along joint line & scar. Steri-strips in place. Small scabs still present on incision.   LOWER EXTREMITY ROM:   ROM Left eval Left  07/15/22 Left 07/20/22 Left 07/27/22 Left 07/30/22 Left 08/05/22 Left 08/18/22 Left 08/21/22 Left 08/25/22 Left 09/08/22  Left 09/15/22 Left 10/14/22  Knee flexion Supine  P: 94* Seated P:111* A: 102* Seated P:111* A: 103* Seated P:113* A: 103* Seated P:120* A: 107*   Supine:  A: 118 P: 122 Supine A: 118 Sitting A: 116 Sitting A: 116 Sitting A: 118  Knee extension Seated: LAQ A: -18* Supine  P: -6* A: quad set -9* Seated: LAQ A: -9* Supine  P: -3* A: quad set -4* Seated: LAQ A: -9* Supine  P: -3* A: quad set -4* Seated: LAQ A: -9* Standing A: TKE -3* Seated A: -4* Standing A: 0* Seated A: 0* LAQ Seated A: 0* LAQ Supine A: 0 Supine A:  Sitting A: 0 Sitting  A: 0 Sitting A: 0   (Blank rows = not tested)  LOWER EXTREMITY MMT:  MMT Left eval Right 1214/23 Left 07/30/22  Left 09/03/22 Left 09/15/22 Left 10/07/22  Knee flexion 3-/5 HH dynameter 55.2# & 53.8# HH dynameter 30.2# & 27.4# LLE:RLE 52.8% HH dynameter 33.0# & 30.7# LLE:RLE 58.4% HHD 33.7#, 34.8# HHD 55.9#  Knee extension 3-/5 HH dynameter 69.3# & 68.7# HH dynameter 44.1# & 43.1# LLE:RLE 63.2% HH dynameter 50.6# & 52.7# LLE:RLE 74.9% HHD 53.0#, 60.1# HHD 60.7#   (Blank rows = not tested)  FUNCTIONAL TESTS:  18 inch chair transfer: minA using BUEs on armrests to RW 09/15/22: 5 time sit to stand: no UE support 16 seconds first attempt               5 time sit to stand: no UE support 12 seconds first attempt 10/07/22: 5 time sit to stand: no UE support 10 seconds first attempt  GAIT: Distance walked: 84' Assistive  device utilized: Environmental consultant - 2 wheeled Level of assistance: SBA Comments: excessive weight bearing BUEs and PWB on LLE   TODAY'S TREATMENT                                                                           DATE:  10/14/22:  Therapeutic Exercise: Recumbent bike: Level 4, x 8 minutes, full revolutions (seat 8) Leg Press: 150# bil LE's 2 x 10, Left LE only: 87# x 20 Reviewed all HEP Neuromuscular Re-edu:  Pushing cart with 120# x 60 feet x 2 Lifting 28# from floor to counter with step turn Hopping on each LE x 5  Retest all ROM and MMT   10/07/22:  Therapeutic Exercise: Recumbent bike: Level 4, x 8 minutes, full revolutions (seat 8) Leg Press: 150# bil LE's 2 x 10, Left LE only: 87# x 20 Squat: using 20# kettle bell x 10 Leg Curl: Left LE only 2 x 10 25# Leg Extension: both LE lifting and left Lowering 20# Neuromuscular Re-edu:  Pushing cart with 120# x 60 feet x 2 Stair climbing 1 flight single hand rail x 2 c step over step pattern Lifting 20# from counter to 1st shelf x 5 Lifting 20# from floor to counter with step turn  09/30/2022: Therapeutic Exercise: Scifit bike: Level 4, x 8 minutes, full revolutions Leg Press: 137# bil LE's x 20, Left LE only: 87# x  20 Squat: using 10# kettle bell x 10 Neuromuscular Re-edu:  Stepping over 9 inch hurdles forward x 4  Stepping over 9 inch hurdles sidestepping x 4  Pushing cart with 25#, 50#, 75#, 100# all 60 feet x 2 Tandem walking 6 feet x 4 no UE support  09/23/2022: Therapeutic Exercise: Leg Press: 137# bil LE's x 25, Left LE only: 87# x 20,  Hamstring stretch seated LLE in 2nd chair strap DF 30 sec hold 2 reps Gastroc & Soleus stretch step heel depression 30 sec hold 2 reps ea.  Quad stretch standing knee flexion with towel 30 sec hold 2 reps Lunges between 2 chairs alternating lead LE 10 reps ea. Squat touching chair to increase depth 15 reps without band & 15 reps green theraband around knees Squat with side step with green theraband 10' right & left Knee ext machine 35# BLEs 15 reps;  15# BLEs concentric, single leg isometric/eccentric 5 reps 2 sets with ea LE;  10# single leg concentric & eccentric 5 reps 2 sets ea LE Knee flex machine 45# BLEs 20 reps;  20# single LLE 15 reps PT updated HEP with above including machines at gym. PT advised to discuss how long he needs to hold resistance exercises with surgeon for hernia.  Pt verbalized understanding.       HOME EXERCISE PROGRAM: Access Code: [email or web address] URL: [email or web address] Date: 10/14/2022 Prepared by: Kearney Hard  Exercises - Supine Heel Slide with Strap  - 2-3 x daily - 7 x weekly - 2-3 sets - 10 reps - 5 seconds hold - Supine Straight Leg Raises  - 2-3 x daily - 7 x weekly - 2-3 sets - 10 reps - 5 seconds hold - Seated straight leg lifts  - 2-3 x daily - 7 x weekly - 2-3 sets -  10 reps - 5 seconds hold - Seated Hamstring Stretch with Strap  - 2-4 x daily - 7 x weekly - 1 sets - 3 reps - 20-30 seconds hold - Seated Hamstring Stretch with Chair  - 1-3 x daily - 7 x weekly - 1 sets - 2-3 reps - 20-30 seconds hold - Gastroc Stretch on Step  - 1-3 x daily - 7 x weekly - 1 sets - 2-3 reps - 20-30 seconds hold - Supine  Quadriceps Stretch with Strap on Table  - 1-3 x daily - 7 x weekly - 1 sets - 2-3 reps - 20-30 seconds hold - Standing Quad Stretch with Strap  - 1-3 x daily - 7 x weekly - 1 sets - 3 reps - 20-30 seconds hold - lunge between 2 chairs  - 1 x daily - 7 x weekly - 2 sets - 10 reps - 5 seconds hold - Squat with Chair Touch and Resistance Loop  - 1 x daily - 7 x weekly - 2 sets - 15 reps - 5 seconds hold - squat & side step with resistance at knees  - 1 x daily - 7 x weekly - 2 sets - 10 reps - 5 seconds hold - Standing Shoulder Row with Anchored Resistance  - 1 x daily - 7 x weekly - 3 sets - 10 reps - Hip Abduction with Resistance Loop  - 1 x daily - 7 x weekly - 2 sets - 10 reps - Hip Extension with Resistance Loop  - 1 x daily - 7 x weekly - 2 sets - 10 reps  ASSESSMENT: CLINICAL IMPRESSION: Pt has currently met all his STG's and LTG's. Pt is scheduled to return to work next week. Discharge from skilled PT services.   OBJECTIVE IMPAIRMENTS: Abnormal gait, decreased activity tolerance, decreased balance, decreased endurance, decreased knowledge of condition, decreased knowledge of use of DME, decreased mobility, difficulty walking, decreased ROM, decreased strength, increased edema, increased muscle spasms, impaired flexibility, obesity, and pain.   ACTIVITY LIMITATIONS: carrying, lifting, bending, sitting, standing, squatting, sleeping, stairs, transfers, bed mobility, and locomotion level  PARTICIPATION LIMITATIONS: meal prep, cleaning, community activity, occupation, and yard work  PERSONAL FACTORS: 3+ comorbidities: see PMH  are also affecting patient's functional outcome.   REHAB POTENTIAL: Good  CLINICAL DECISION MAKING: Stable/uncomplicated  EVALUATION COMPLEXITY: Low   GOALS: Goals reviewed with patient? Yes  UPDATED SHORT TERM GOALS: (target date 10/16/2022)   1.  Patient will demonstrate understanding of updated home exercise program including gym / fitness center program to  progress. Goal status: MET 10/14/22  2. Patient reports left knee pain 2/10 with above exercises / activities.  Goal Status:  MET   3. Left knee PROM -3* ext to 100* flex  Goal Status:MET 08/05/22   UPDATED LONG TERM GOALS: (target dates for all long term goals are 8 weeks  11/13/2022 )   1. Patient will demonstrate/report pain at worst less than or equal to 2/10 to facilitate minimal limitation in daily activity secondary to pain symptoms.  Goal status: MET 10/07/22 (most days)   2. Patient will demonstrate independent use of home exercise program to facilitate ability to maintain/progress functional gains from skilled physical therapy services.  Goal status: Met 10/14/22    3. Patient will demonstrate FOTO outcome > or = 70 % to indicate reduced disability due to condition.  Goal status:  MET 08/18/2022   4.  Patient will demonstrate left LE MMT 5/5 throughout to faciltiate usual  transfers, stairs, squatting at [facility] for daily life.   Goal status:   MET 10/14/22  5.  Patient will demonstrate ability to perform work task of lifting & pushing weighted cart.  Goal status: MET 10/14/22  (pt able to push cart with 120# on level clinic floor, lift 28# box from floor to chest height table)   6.  patient ambulates >500', negotiates ramps, curbs & stairs single rail independent without assistive device.  Goal status: MET 09/16/2022   7.  Left knee AROM 0* ext standing and 110* flexion seated. Goal Status: MET 09/08/22  PLAN:  PT FREQUENCY: 1x/week  PT DURATION: 8 weeks  PLANNED INTERVENTIONS: Therapeutic exercises, Therapeutic activity, Neuro Muscular re-education, Balance training, Gait training, Patient/Family education, Joint mobilization, Stair training, DME instructions, Dry Needling, Electrical stimulation, Traction, Cryotherapy, vasopneumatic device, Moist heat, Taping, Ultrasound, Ionotophoresis '4mg'$ /ml Dexamethasone, and Manual therapy.  All included unless contraindicated  PLAN  FOR NEXT SESSION:  Discharged from skilled PT   [place]  Visits from Start of Care: 24  Current functional level related to goals / functional outcomes: See above   Remaining deficits: See above   Education / Equipment: Updated HEP   Patient agrees to discharge. Patient goals were met. Patient is being discharged due to meeting the stated rehab goals.  Oretha Caprice, PT, MPT 10/14/2022, 8:47 AM

## 2022-10-21 ENCOUNTER — Encounter: Payer: 59 | Admitting: Physical Therapy

## 2022-10-28 ENCOUNTER — Encounter: Payer: 59 | Admitting: Physical Therapy

## 2022-11-05 ENCOUNTER — Other Ambulatory Visit: Payer: Self-pay | Admitting: Physician Assistant

## 2022-11-05 ENCOUNTER — Ambulatory Visit (INDEPENDENT_AMBULATORY_CARE_PROVIDER_SITE_OTHER): Payer: 59 | Admitting: Family Medicine

## 2022-11-05 ENCOUNTER — Encounter: Payer: Self-pay | Admitting: Family Medicine

## 2022-11-05 VITALS — BP 126/68 | HR 76 | Temp 97.7°F | Ht 73.0 in | Wt 262.0 lb

## 2022-11-05 DIAGNOSIS — N529 Male erectile dysfunction, unspecified: Secondary | ICD-10-CM | POA: Diagnosis not present

## 2022-11-05 DIAGNOSIS — Z1211 Encounter for screening for malignant neoplasm of colon: Secondary | ICD-10-CM

## 2022-11-05 DIAGNOSIS — R7303 Prediabetes: Secondary | ICD-10-CM

## 2022-11-05 DIAGNOSIS — Z125 Encounter for screening for malignant neoplasm of prostate: Secondary | ICD-10-CM

## 2022-11-05 DIAGNOSIS — R351 Nocturia: Secondary | ICD-10-CM | POA: Insufficient documentation

## 2022-11-05 LAB — HEMOGLOBIN A1C: Hgb A1c MFr Bld: 6.2 % (ref 4.6–6.5)

## 2022-11-05 LAB — PSA: PSA: 1.02 ng/mL (ref 0.10–4.00)

## 2022-11-05 NOTE — Assessment & Plan Note (Signed)
Pt may have some BPH - nocturia times 2-3  Interested in discussing screening with urologist  Has 2 paternal uncles over age of 61 with prostate cancer   Psa ordered  Urology referral done

## 2022-11-05 NOTE — Progress Notes (Signed)
Subjective:    Patient ID: Sean Lee, male    DOB: [birth year], 62 y.o.   MRN: [id number]  HPI Pt presents to discuss colon cancer screening   Wt Readings from Last 3 Encounters:  11/05/22 262 lb (118.8 kg)  08/24/22 263 lb 4 oz (119.4 kg)  06/18/22 270 lb (122.5 kg)   34.57 kg/m  Vitals:   11/05/22 0756  BP: 126/68  Pulse: 76  Temp: 97.7 F (36.5 C)  SpO2: 97%   Doing well after a total knee replacement Getting back to work   Pt had a colonoscopy 10/2015 3 sessile polyps were found Several serrated polyps  One yperplastic  Dr Loletha Carrow    Letter sent at that time   Sean Lee [address] [place] [address]  Dear Sean Lee,  One of the two polyps removed from your colon was precancerous. This means that it had the potential to change into cancer over time.  I recommend you have a repeat colonoscopy in 5 years to determine if you have developed any new polyps and to screen for colorectal cancer.   If you develop any new rectal bleeding, abdominal pain or significant bowel habit changes, please contact us before then at [phone].    Sincerely,  Sean Meuse III, MD  No bowel changes  Doing ok   No fam h/o colon cancer  2 uncles with prostate cancer, 70-s and 75s - both paternal   Mother was recently dx with esoph cancer / may be stomach cancer    Lab Results  Component Value Date   PSA 0.87 04/02/2020   PSA 0.82 11/03/2018   PSA 0.78 07/22/2017   No urinary changes  He takes hctz  Drinks a lot of water in the evening  Avg nocturia is twice   Is interested in talking to a urologist about screening options   HTN bp is stable today  No cp or palpitations or headaches or edema  No side effects to medicines  BP Readings from Last 3 Encounters:  11/05/22 126/68  08/24/22 129/70  06/19/22 (!) 167/86     Lab Results  Component Value Date   CREATININE 1.08 06/11/2022   BUN 20 06/11/2022   NA 137 06/11/2022   K 3.8  06/11/2022   CL 102 06/11/2022   CO2 25 06/11/2022   Prediabetes Lab Results  Component Value Date   HGBA1C 6.3 12/18/2021   No bread  Eating better now  Had knee surgery-plans on exercise   Patient Active Problem List   Diagnosis Date Noted   Nocturia 11/05/2022   Status post total left knee replacement 06/18/2022   Primary osteoarthritis of left knee 01/01/2022   Primary osteoarthritis of right knee 01/01/2022   Left knee pain [id number]   Umbilical hernia [id number]   Erectile dysfunction 12/31/2020   Insomnia 04/02/2020   Right ankle swelling 07/25/2019   Gout 11/03/2018   Chronic pain of right ankle 08/31/2018   Routine general medical examination at a health care facility 11/01/2017   Prostate cancer screening 06/28/2017   Alopecia 11/04/2016   Back pain 05/06/2016   Sleep disorder 08/28/2015   Colon cancer screening 08/28/2015   Prediabetes 01/02/2013   Varicosities of leg 03/21/2012   Rectus diastasis of lower abdomen 03/21/2012   Obstructive sleep apnea 10/15/2009   Class 2 severe obesity due to excess calories with serious comorbidity and body mass index (BMI) of 38.0 to 38.9 in adult [facility]) 09/02/2009  Hyperlipidemia 02/02/2007   HEMATURIA, MICROSCOPIC 02/02/2007   Essential hypertension 02/01/2007   CEREBROVASCULAR ACCIDENT, HX OF 02/01/2007   Past Medical History:  Diagnosis Date   Contact dermatitis and other eczema due to plants (except food)    H/O: CVA (cardiovascular accident)    Carotid doppler neg (4/08)   Hematuria    Obesity, unspecified    Other and unspecified hyperlipidemia    Hx - no medication/diet controll and weight loss   Sacroiliitis, not elsewhere classified ([place])    Sleep apnea    uses CPAP but not every night   Unspecified essential hypertension    Varicose veins of left lower extremity    Past Surgical History:  Procedure Laterality Date   Carotid dopplers  11/16/2006   Negative   HERNIA REPAIR     umbilical   TOTAL  KNEE ARTHROPLASTY Left 06/18/2022   TOTAL KNEE ARTHROPLASTY Left 06/18/2022   Procedure: LEFT TOTAL KNEE ARTHROPLASTY;  Surgeon: Leandrew Koyanagi, MD;  Location: [facility];  Service: Orthopedics;  Laterality: Left;   Social History   Tobacco Use   Smoking status: Never   Smokeless tobacco: Never  Vaping Use   Vaping Use: Never used  Substance Use Topics   Alcohol use: Yes    Alcohol/week: 4.0 standard drinks of alcohol    Types: 4 Cans of beer per week    Comment: weekends   Drug use: No   Family History  Problem Relation Age of Onset   Hypertension Mother    Diabetes Mother    Esophageal cancer Mother    Hyperlipidemia Other        in family    Prostate cancer Paternal Uncle    Prostate cancer Paternal Uncle    Cancer Neg Hx    Rectal cancer Neg Hx    Stomach cancer Neg Hx    Colon cancer Neg Hx    Allergies  Allergen Reactions   Benazepril Hcl Cough   Current Outpatient Medications on File Prior to Visit  Medication Sig Dispense Refill   latanoprost (XALATAN) 0.005 % ophthalmic solution Place 1 drop into both eyes at bedtime.     olmesartan-hydrochlorothiazide (BENICAR HCT) 40-25 MG tablet TAKE 1 TABLET DAILY 90 tablet 0   sildenafil (VIAGRA) 50 MG tablet TAKE 1 TABLET (50 MG TOTAL) BY MOUTH DAILY AS NEEDED FOR ERECTILE DYSFUNCTION. 30 MINUTES BEFORE SEXUAL ACTIVITY 10 tablet 3   No current facility-administered medications on file prior to visit.    Review of Systems  Constitutional:  Negative for activity change, appetite change, fatigue, fever and unexpected weight change.  HENT:  Negative for congestion, rhinorrhea, sore throat and trouble swallowing.   Eyes:  Negative for pain, redness, itching and visual disturbance.  Respiratory:  Negative for cough, chest tightness, shortness of breath and wheezing.   Cardiovascular:  Negative for chest pain and palpitations.  Gastrointestinal:  Negative for abdominal pain, blood in stool, constipation, diarrhea and nausea.        Umbilical hernia does not hurt currently  Occ bothers him    Endocrine: Negative for cold intolerance, heat intolerance, polydipsia and polyuria.  Genitourinary:  Negative for difficulty urinating, dysuria, frequency and urgency.  Musculoskeletal:  Negative for arthralgias, joint swelling and myalgias.  Skin:  Negative for pallor and rash.  Neurological:  Negative for dizziness, tremors, weakness, numbness and headaches.  Hematological:  Negative for adenopathy. Does not bruise/bleed easily.  Psychiatric/Behavioral:  Negative for decreased concentration and dysphoric mood. The patient is  not nervous/anxious.        Objective:   Physical Exam Constitutional:      General: He is not in acute distress.    Appearance: He is well-developed.  HENT:     Head: Normocephalic and atraumatic.  Eyes:     Conjunctiva/sclera: Conjunctivae normal.     Pupils: Pupils are equal, round, and reactive to light.  Neck:     Thyroid: No thyromegaly.     Vascular: No carotid bruit or JVD.  Cardiovascular:     Rate and Rhythm: Normal rate and regular rhythm.     Heart sounds: Normal heart sounds.     No gallop.  Pulmonary:     Effort: Pulmonary effort is normal. No respiratory distress.     Breath sounds: Normal breath sounds. No wheezing or rales.  Abdominal:     General: There is no distension or abdominal bruit.     Palpations: Abdomen is soft.  Musculoskeletal:     Cervical back: Normal range of motion and neck supple.     Right lower leg: No edema.     Left lower leg: No edema.  Lymphadenopathy:     Cervical: No cervical adenopathy.  Skin:    General: Skin is warm and dry.     Coloration: Skin is not pale.     Findings: No rash.  Neurological:     Mental Status: He is alert.     Coordination: Coordination normal.     Deep Tendon Reflexes: Reflexes are normal and symmetric. Reflexes normal.  Psychiatric:        Mood and Affect: Mood normal.           Assessment & Plan:    Problem List Items Addressed This Visit       Other   Colon cancer screening - Primary    Overdue for 5 y recall colonoscopy  Reviiewed report and path results from colonoscopy 2017 with sessile polyps   Ref made to GI/Dr Loletha Carrow for colonoscopy  Pt has no fam hx  He will call for appt         Relevant Orders   Ambulatory referral to Gastroenterology   Erectile dysfunction    No improvement with low dose viagra Referral made to urology for this and to discuss prostate ca screen      Relevant Orders   Ambulatory referral to Urology   Nocturia    2-3 times Suspect BPH No change in stream per pt       Prediabetes    A1c today  Pt has stopped eating bread  Higher fiber diet   disc imp of low glycemic diet and wt loss to prevent DM2       Relevant Orders   Hemoglobin A1c   Prostate cancer screening    Pt may have some BPH - nocturia times 2-3  Interested in discussing screening with urologist  Has 2 paternal uncles over age of 73 with prostate cancer   Psa ordered  Urology referral done       Relevant Orders   PSA   Ambulatory referral to Urology

## 2022-11-05 NOTE — Assessment & Plan Note (Signed)
2-3 times Suspect BPH No change in stream per pt

## 2022-11-05 NOTE — Patient Instructions (Addendum)
Call the GI office to schedule your colonoscopy    [facility]  [phone]  Labs tor psa today and also a1c Let's get an appt with a urologist to discuss prostate cancer screening and urinary changes I put the referral in  Please let us know if you don't hear in 1-2 weeks    If you are interested in the shingles vaccine series (Shingrix), call your insurance or pharmacy to check on coverage and location it must be given.  If affordable - you can schedule it here or at your pharmacy depending on coverage   Try to get most of your carbohydrates from produce (with the exception of white potatoes)  Eat less bread/pasta/rice/snack foods/cereals/sweets and other items from the middle of the grocery store (processed carbs)

## 2022-11-05 NOTE — Assessment & Plan Note (Signed)
No improvement with low dose viagra Referral made to urology for this and to discuss prostate ca screen

## 2022-11-05 NOTE — Assessment & Plan Note (Signed)
A1c today  Pt has stopped eating bread  Higher fiber diet   disc imp of low glycemic diet and wt loss to prevent DM2

## 2022-11-05 NOTE — Assessment & Plan Note (Addendum)
Overdue for 5 y recall colonoscopy  Reviiewed report and path results from colonoscopy 2017 with sessile polyps   Ref made to GI/Dr Loletha Carrow for colonoscopy  Pt has no fam hx  He will call for appt

## 2022-11-06 ENCOUNTER — Encounter: Payer: Self-pay | Admitting: *Deleted

## 2022-12-01 ENCOUNTER — Other Ambulatory Visit: Payer: Self-pay | Admitting: Family Medicine

## 2022-12-09 ENCOUNTER — Telehealth: Payer: Self-pay | Admitting: *Deleted

## 2022-12-09 ENCOUNTER — Ambulatory Visit (AMBULATORY_SURGERY_CENTER): Payer: 59 | Admitting: *Deleted

## 2022-12-09 ENCOUNTER — Encounter: Payer: Self-pay | Admitting: Gastroenterology

## 2022-12-09 VITALS — Ht 73.0 in | Wt 256.0 lb

## 2022-12-09 DIAGNOSIS — Z86010 Personal history of colonic polyps: Secondary | ICD-10-CM

## 2022-12-09 MED ORDER — NA SULFATE-K SULFATE-MG SULF 17.5-3.13-1.6 GM/177ML PO SOLN: 1.0000 | Freq: Once | ORAL | 0 refills | Status: AC

## 2022-12-09 NOTE — Progress Notes (Addendum)
Pt's name and DOB verified at the beginning of the pre-visit.  Pt denies any difficulty with ambulating.,sitting laying down or turning side to side  No egg or soy allergy known to patient  No issues known to pt with past sedation with any surgeries or procedures Pt denies having issues being intubated Pt has no issues moving head neck or swallowing No FH of Malignant Hyperthermia Pt is not on diet pills Pt is not on home 02  Pt is not on blood thinners  Pt has occasional issues with constipation RN instructed pt to use Miralax per bottles instructions a week before prep days. Pt states they will Pt is not on dialysis Pt denies any upcoming cardiac testing Pt encouraged to use to use Singlecare or Goodrx to reduce cost  Patient's chart reviewed by Cathlyn Parsons CNRA prior to pre-visit and patient appropriate for the [facility].  Pre-visit completed and red dot placed by patient's name on their procedure day (on provider's schedule).  . Visit by phone Pt states weight is 256 lb Instructed pt why it is important to and  to call if they have any changes in health or new medications. Directed them to the # given and on instructions.   Pt states they will.  Instructions reviewed with pt and pt states understanding. Instructed to review again prior to procedure. Pt states they will.  Instructions sent by mail with coupon and by my chart

## 2022-12-09 NOTE — Telephone Encounter (Signed)
!  st attempt to reach pt for pre-visit appt. LM with # for pt to call back and instructions for pt to call # if RN does not reach pt with next attempt by end of day to reschedule pre-vist or per protocol both pre-visit and procedure will be canceled.. Will attempt to call other # listed in profile.  Pt called back and pre-visit done

## 2022-12-17 ENCOUNTER — Encounter: Payer: 59 | Admitting: Gastroenterology

## 2023-01-13 ENCOUNTER — Encounter: Payer: 59 | Admitting: Gastroenterology

## 2023-06-03 ENCOUNTER — Other Ambulatory Visit: Payer: Self-pay | Admitting: Family Medicine

## 2023-08-02 ENCOUNTER — Encounter: Payer: Self-pay | Admitting: Family Medicine

## 2023-08-02 NOTE — Telephone Encounter (Signed)
 Care team updated and letter sent for eye exam notes.

## 2023-08-31 ENCOUNTER — Ambulatory Visit: Payer: Self-pay | Admitting: Family Medicine

## 2023-08-31 NOTE — Telephone Encounter (Signed)
 Copied from CRM [id number]. Topic: Clinical - Red Word Triage >> Aug 31, 2023  8:28 AM Leotis ORN wrote: [facility] that prompted transfer to Nurse Triage: Swelling in right ankle and foot, extreme pain, can barely walk. Pt already seen ortho and used boot, nothing helps   Chief Complaint: ankle swelling and pain Symptoms: 9/10 moderate pain to ankle/foot/big toe/radiating up leg a bit, swelling to both sides of ankle, redness the size of a golf ball  Frequency: continual, worsening today Pertinent Negatives: Patient denies fever, swelling and pain to other ankle, known injury/infection, visible bruising, red streaking Disposition: [] 911 / [] ED /[] Urgent Care (no appt availability in office) / [x] Appointment(In office/virtual)/ []  [facility]/ [] Home Care/ [] Refused Recommended Disposition /[] [facility]/ []  Follow-up with PCP Additional Notes: Pt reporting 9/10 pain and swelling to right ankle with swelling to both sides of ankle, pain to ankle, foot, big toe, and feels like coming up on side of leg, towards calf. Pt reporting he can't hardly walk but able to put on sock and shoe, pt currently driving with right foot. Pt confirms ankle is tender to touch, but foot is not cool or blue, no red streaking to area, no pain/swelling in other ankle, no bruising. Pt confirms discomfort is improved with rest and elevation, but nothing working with OTC pain meds. Pt reporting some redness there, feels bruised and redness is size of golf ball. Pt confirms he's had this issue for a while but gotten worse as the day's gone by with the swelling. Pt clarifies that pain is moderate. Advised pt be examined in next 24 hours, scheduled appt with PCP office tomorrow morning. Advised pt call back if fever or worsening of pain/swelling, go to ED if redness spreading with severe pain. Pt verbalized understanding.  Reason for Disposition  [1] Redness of the skin AND [2] no fever  Protocols  used: Ankle Pain-A-AH

## 2023-08-31 NOTE — Telephone Encounter (Signed)
 Okay---will check at the OV

## 2023-08-31 NOTE — Telephone Encounter (Signed)
 Appt scheduled with Dr. Alphonsus Sias tomorrow, will route to PCP and Dr. Alphonsus Sias so he is aware

## 2023-09-01 ENCOUNTER — Ambulatory Visit (INDEPENDENT_AMBULATORY_CARE_PROVIDER_SITE_OTHER): Payer: 59 | Admitting: Internal Medicine

## 2023-09-01 ENCOUNTER — Encounter: Payer: Self-pay | Admitting: Internal Medicine

## 2023-09-01 VITALS — BP 110/74 | HR 70 | Temp 98.2°F | Ht 73.0 in | Wt 277.0 lb

## 2023-09-01 DIAGNOSIS — M10071 Idiopathic gout, right ankle and foot: Secondary | ICD-10-CM | POA: Diagnosis not present

## 2023-09-01 DIAGNOSIS — I10 Essential (primary) hypertension: Secondary | ICD-10-CM

## 2023-09-01 DIAGNOSIS — M109 Gout, unspecified: Secondary | ICD-10-CM | POA: Insufficient documentation

## 2023-09-01 MED ORDER — OLMESARTAN MEDOXOMIL 40 MG PO TABS: 40.0000 mg | ORAL_TABLET | Freq: Every day | ORAL | 3 refills | Status: DC

## 2023-09-01 MED ORDER — COLCHICINE 0.6 MG PO TABS: 0.6000 mg | ORAL_TABLET | Freq: Two times a day (BID) | ORAL | 1 refills | Status: DC | PRN

## 2023-09-01 NOTE — Assessment & Plan Note (Signed)
 BP Readings from Last 3 Encounters:  09/01/23 110/74  11/05/22 126/68  08/24/22 129/70   Given gout flare--will change med to just olmesartan 

## 2023-09-01 NOTE — Assessment & Plan Note (Signed)
 Will try naproxen  440 bid Colchicine  0.6 bid prn If not better in 1-2 days --will add prednisone 

## 2023-09-01 NOTE — Progress Notes (Signed)
 Subjective:    Patient ID: Sean Lee, male    DOB: October 09, 1960, 63 y.o.   MRN: [id number]  HPI Here due to right ankle and foot swelling   Having trouble walking and pain Swelling goes back a while (months)---did see podiatrist and they fitted him for a boot Now swelling worse and pain is severe Pain shoots to big toe Some redness  Has tried TENS--no help No medications No known injury No history of gout  Current Outpatient Medications on File Prior to Visit  Medication Sig Dispense Refill   latanoprost (XALATAN) 0.005 % ophthalmic solution Place 1 drop into both eyes at bedtime.     olmesartan -hydrochlorothiazide  (BENICAR  HCT) 40-25 MG tablet TAKE 1 TABLET DAILY 90 tablet 0   sildenafil  (VIAGRA ) 50 MG tablet TAKE 1 TABLET (50 MG TOTAL) BY MOUTH DAILY AS NEEDED FOR ERECTILE DYSFUNCTION. 30 MINUTES BEFORE SEXUAL ACTIVITY 10 tablet 3   No current facility-administered medications on file prior to visit.    Allergies  Allergen Reactions   Benazepril Hcl Cough    Past Medical History:  Diagnosis Date   Contact dermatitis and other eczema due to plants (except food)    H/O: CVA (cardiovascular accident)    Carotid doppler neg (4/08)   Hematuria    Obesity, unspecified    Other and unspecified hyperlipidemia    Hx - no medication/diet controll and weight loss   Sacroiliitis, not elsewhere classified (HCC)    Sleep apnea    uses CPAP but not every night   Unspecified essential hypertension    Varicose veins of left lower extremity     Past Surgical History:  Procedure Laterality Date   Carotid dopplers  11/16/2006   Negative   HERNIA REPAIR     umbilical   TOTAL KNEE ARTHROPLASTY Left 06/18/2022   TOTAL KNEE ARTHROPLASTY Left 06/18/2022   Procedure: LEFT TOTAL KNEE ARTHROPLASTY;  Surgeon: Wes Hamman, MD;  Location: [facility] OR;  Service: Orthopedics;  Laterality: Left;    Family History  Problem Relation Age of Onset   Hypertension Mother    Diabetes Mother     Esophageal cancer Mother    Prostate cancer Paternal Uncle    Prostate cancer Paternal Uncle    Hyperlipidemia Other        in family    Cancer Neg Hx    Rectal cancer Neg Hx    Stomach cancer Neg Hx    Colon cancer Neg Hx    Colon polyps Neg Hx     Social History   Socioeconomic History   Marital status: Married    Spouse name: Not on file   Number of children: 2   Years of education: Not on file   Highest education level: Not on file  Occupational History   Occupation: Truck Air traffic controller: [employer]  Tobacco Use   Smoking status: Never   Smokeless tobacco: Never  Vaping Use   Vaping status: Never Used  Substance and Sexual Activity   Alcohol use: Yes    Alcohol/week: 4.0 standard drinks of alcohol    Types: 4 Cans of beer per week    Comment: weekends   Drug use: No   Sexual activity: Not on file  Other Topics Concern   Not on file  Social History Narrative   Not on file   Social Drivers of Health   Financial Resource Strain: Not on file  Food Insecurity: Patient Declined (06/18/2022)   Hunger Vital  Sign    Worried About Programme researcher, broadcasting/film/video in the Last Year: Patient declined    Ran Out of Food in the Last Year: Patient declined  Transportation Needs: No Transportation Needs (06/18/2022)   PRAPARE - Administrator, Civil Service (Medical): No    Lack of Transportation (Non-Medical): No  Physical Activity: Not on file  Stress: Not on file  Social Connections: Unknown (12/28/2021)   [facility]   Social Network    Social Network: Not on file  Intimate Partner Violence: Not At Risk (06/18/2022)   Humiliation, Afraid, Rape, and Kick questionnaire    Fear of Current or Ex-Partner: No    Emotionally Abused: No    Physically Abused: No    Sexually Abused: No   Review of Systems No fever     Objective:   Physical Exam Constitutional:      Appearance: Normal appearance.  Musculoskeletal:     Comments:  Moderate swelling at right ankle---quite painful (more so medial than lateral malleolus) Swelling/some redness and exquisite tenderness at right 1st MTP  Neurological:     Mental Status: He is alert.            Assessment & Plan:

## 2023-09-01 NOTE — Patient Instructions (Signed)
 Please take 2 aleve  twice a day with food---till the pain is better. Take 2 of the prescription colchicine  as soon as you get it--then another in a couple of hours. Continue this twice a day for the next few days---till your foot feels pretty normal. Then take one a day for a few days--then stop. You can restart this if you get a flare again.  Low-Purine Eating Plan A low-purine eating plan involves making food choices to limit your purine intake. Purine is a kind of uric acid. Too much uric acid in your blood can cause certain conditions, such as gout and kidney stones. Eating a low-purine diet may help control these conditions. What are tips for following this plan? Shopping Avoid buying products that contain high-fructose corn syrup. Check for this on food labels. It is commonly found in many processed foods and soft drinks. Be sure to check for it in baked goods such as cookies, canned fruits, and cereals and cereal bars. Avoid buying veal, chicken breast with skin, lamb, and organ meats such as liver. These types of meats tend to have the highest purine content. Choose dairy products. These may lower uric acid levels. Avoid certain types of fish. Not all fish and seafood have high purine content. Examples with high purine content include anchovies, trout, tuna, sardines, and salmon. Avoid buying beverages that contain alcohol, particularly beer and hard liquor. Alcohol can affect the way your body gets rid of uric acid. Meal planning  Learn which foods do or do not affect you. If you find out that a food tends to cause your gout symptoms to flare up, avoid eating that food. You can enjoy foods that do not cause problems. If you have any questions about a food item, talk with your dietitian or health care provider. Reduce the overall amount of meat in your diet. When you do eat meat, choose ones with lower purine content. Include plenty of fruits and vegetables. Although some vegetables may have  a high purine content--such as asparagus, mushrooms, spinach, or cauliflower--it has been shown that these do not contribute to uric acid blood levels as much. Consume at least 1 dairy serving a day. This has been shown to decrease uric acid levels. General information If you drink alcohol: Limit how much you have to: 0-1 drink a day for women who are not pregnant. 0-2 drinks a day for men. Know how much alcohol is in a drink. In the [place] one drink equals one 12 oz bottle of beer (355 mL), one 5 oz glass of wine (148 mL), or one 1 oz glass of hard liquor (44 mL). Drink plenty of water. Try to drink enough to keep your urine pale yellow. Fluids can help remove uric acid from your body. Work with your health care provider and dietitian to develop a plan to achieve or maintain a healthy weight. Losing weight may help reduce uric acid in your blood. What foods are recommended? The following are some types of foods that are good choices when limiting purine intake: Fresh or frozen fruits and vegetables. Whole grains, breads, cereals, and pasta. Rice. Beans, peas, legumes. Nuts and seeds. Dairy products. Fats and oils. The items listed above may not be a complete list. Talk with a dietitian about what dietary choices are best for you. What foods are not recommended? Limit your intake of foods high in purines, including: Beer and other alcohol. Meat-based gravy or sauce. Canned or fresh fish, such as: Anchovies, sardines, herring, salmon,  and tuna. Mussels and scallops. Codfish, trout, and haddock. Bacon, veal, chicken breast with skin, and lamb. Organ meats, such as: Liver or kidney. Tripe. Sweetbreads (thymus gland or pancreas). Wild Education officer, environmental. Yeast or yeast extract supplements. Drinks sweetened with high-fructose corn syrup, such as soda. Processed foods made with high-fructose corn syrup. The items listed above may not be a complete list of foods and beverages you should  limit. Contact a dietitian for more information. Summary Eating a low-purine diet may help control conditions caused by too much uric acid in the body, such as gout or kidney stones. Choose low-purine foods, limit alcohol, and limit high-fructose corn syrup. You will learn over time which foods do or do not affect you. If you find out that a food tends to cause your gout symptoms to flare up, avoid eating that food. This information is not intended to replace advice given to you by your health care provider. Make sure you discuss any questions you have with your health care provider. Document Revised: 07/17/2021 Document Reviewed: 07/17/2021 Elsevier Patient Education  2024 [employer].

## 2023-09-02 ENCOUNTER — Telehealth: Payer: Self-pay | Admitting: Family Medicine

## 2023-09-02 NOTE — Telephone Encounter (Signed)
Copied from CRM [id number]. 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 Please call pt.

## 2023-09-02 NOTE — Telephone Encounter (Signed)
Pt called to say the colchicine was $500. I advised him that he should be able to use a good Rx card at [facility] for less than $25. He will contact the pharmacy.

## 2023-09-17 ENCOUNTER — Other Ambulatory Visit: Payer: Self-pay | Admitting: Internal Medicine

## 2023-10-11 ENCOUNTER — Other Ambulatory Visit: Payer: Self-pay | Admitting: Internal Medicine

## 2023-10-24 ENCOUNTER — Other Ambulatory Visit: Payer: Self-pay

## 2023-10-24 ENCOUNTER — Ambulatory Visit (INDEPENDENT_AMBULATORY_CARE_PROVIDER_SITE_OTHER)

## 2023-10-24 ENCOUNTER — Ambulatory Visit: Admission: EM | Admit: 2023-10-24 | Discharge: 2023-10-24 | Disposition: A | Discharge status: Home / Self Care

## 2023-10-24 ENCOUNTER — Encounter: Payer: Self-pay | Admitting: *Deleted

## 2023-10-24 DIAGNOSIS — M25471 Effusion, right ankle: Secondary | ICD-10-CM

## 2023-10-24 DIAGNOSIS — R030 Elevated blood-pressure reading, without diagnosis of hypertension: Secondary | ICD-10-CM

## 2023-10-24 MED ORDER — PREDNISONE 20 MG PO TABS: 40.0000 mg | ORAL_TABLET | Freq: Every day | ORAL | 0 refills | Status: AC

## 2023-10-24 NOTE — ED Triage Notes (Signed)
 RLE swollen (ankle and up through calf) for over 2 weeks. States his PCP told him it was gout caused by hydrochlorothiazide. His BP meds were changed and started on colchicine but no relief. Right ankle is markedly swollen. Denies injury. 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 States no imaging has been done as of yet

## 2023-10-24 NOTE — ED Provider Notes (Addendum)
 [facility]    CSN: [id number] Arrival date & time: 10/24/23  1027      History   Chief Complaint Chief Complaint  Patient presents with   Leg Swelling    HPI Sean Lee is a 63 y.o. male.   Patient presents with intermittent, chronic right ankle swelling.  Reports that it flared up over the past 2 weeks.  He last saw PCP in January who prescribed him colchicine given suspicion of gout.  He does not have a definitive history for gout.  Reports that he has been taking colchicine with no improvement.  He also reports that PCP stopped his hydrochlorothiazide for blood pressure given there was a concern that this could be causing gout.  States they put him on olmesartan which he has been taking.  Blood pressures have been good at home per patient report but he cannot remember the numbers.  Reports that he recently had a DOT physical as well and blood pressure was normal.  Reports ankle is minimally painful.  States that he has been dealing with intermittent ankle swelling for months to years.  He previously saw podiatrist for this who placed him in a boot which was not helpful.  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  He had imaging in 2020 but has not had any imaging since.  Denies any obvious injury to the area.  Patient denies headache, chest pain, shortness of breath, dizziness, blurred vision, nausea, vomiting.     Past Medical History:  Diagnosis Date   Contact dermatitis and other eczema due to plants (except food)    H/O: CVA (cardiovascular accident)    Carotid doppler neg (4/08)   Hematuria    Obesity, unspecified    Other and unspecified hyperlipidemia    Hx - no medication/diet controll and weight loss   Sacroiliitis, not elsewhere classified (HCC)    Sleep apnea    uses CPAP but not every night   Unspecified essential hypertension    Varicose veins of left lower extremity     Patient Active Problem List    Diagnosis Date Noted   Acute gout 09/01/2023   Nocturia 11/05/2022   Status post total left knee replacement 06/18/2022   Primary osteoarthritis of left knee 01/01/2022   Primary osteoarthritis of right knee 01/01/2022   Left knee pain 12/18/2021   Umbilical hernia 12/18/2021   Erectile dysfunction 12/31/2020   Insomnia 04/02/2020   Right ankle swelling 07/25/2019   Gout 11/03/2018   Chronic pain of right ankle 08/31/2018   Routine general medical examination at a health care facility 11/01/2017   Prostate cancer screening 06/28/2017   Alopecia 11/04/2016   Back pain 05/06/2016   Sleep disorder 08/28/2015   Colon cancer screening 08/28/2015   Prediabetes 01/02/2013   Varicosities of leg 03/21/2012   Rectus diastasis of lower abdomen 03/21/2012   Obstructive sleep apnea 10/15/2009   Class 2 severe obesity due to excess calories with serious comorbidity and body mass index (BMI) of 38.0 to 38.9 in adult [facility]) 09/02/2009   Hyperlipidemia 02/02/2007   HEMATURIA, MICROSCOPIC 02/02/2007   Essential hypertension 02/01/2007    Past Surgical History:  Procedure Laterality Date   Carotid dopplers  11/16/2006   Negative   HERNIA REPAIR     umbilical   TOTAL KNEE ARTHROPLASTY Left 06/18/2022   TOTAL KNEE ARTHROPLASTY Left 06/18/2022   Procedure: LEFT TOTAL KNEE ARTHROPLASTY;  Surgeon: Tarry Kos, MD;  Location: [facility] OR;  Service: Orthopedics;  Laterality: Left;       Home Medications    Prior to Admission medications   Medication Sig Start Date End Date Taking? Authorizing Provider  latanoprost (XALATAN) 0.005 % ophthalmic solution Place 1 drop into both eyes at bedtime. 08/16/18  Yes [provider]  olmesartan (BENICAR) 40 MG tablet Take 1 tablet (40 mg total) by mouth daily. 09/01/23  Yes Karie Schwalbe, MD  predniSONE (DELTASONE) 20 MG tablet Take 2 tablets (40 mg total) by mouth daily for 5 days. 10/24/23 10/29/23 Yes Patience Nuzzo, Acie Fredrickson, FNP  tadalafil (CIALIS) 5 MG  tablet Take 5 mg by mouth daily as needed. 06/02/23  Yes [provider]  tamsulosin (FLOMAX) 0.4 MG CAPS capsule Take 0.4 mg by mouth at bedtime. 09/08/23  Yes [provider]  timolol (TIMOPTIC) 0.5 % ophthalmic solution Place 1 drop into both eyes daily. 07/28/23  Yes [provider]  colchicine 0.6 MG tablet Take 1 tablet (0.6 mg total) by mouth 2 (two) times daily as needed. 09/01/23   Karie Schwalbe, MD  sildenafil (VIAGRA) 50 MG tablet TAKE 1 TABLET (50 MG TOTAL) BY MOUTH DAILY AS NEEDED FOR ERECTILE DYSFUNCTION. 30 MINUTES BEFORE SEXUAL ACTIVITY Patient not taking: Reported on 10/24/2023 01/08/22   Judy Pimple, MD    Family History Family History  Problem Relation Age of Onset   Hypertension Mother    Diabetes Mother    Esophageal cancer Mother    Prostate cancer Paternal Uncle    Prostate cancer Paternal Uncle    Hyperlipidemia Other        in family    Cancer Neg Hx    Rectal cancer Neg Hx    Stomach cancer Neg Hx    Colon cancer Neg Hx    Colon polyps Neg Hx     Social History Social History   Tobacco Use   Smoking status: Never   Smokeless tobacco: Never  Vaping Use   Vaping status: Never Used  Substance Use Topics   Alcohol use: Yes    Alcohol/week: 4.0 standard drinks of alcohol    Types: 4 Cans of beer per week    Comment: weekends- iccasional   Drug use: No     Allergies   Benazepril hcl   Review of Systems Review of Systems Per HPI  Physical Exam Triage Vital Signs ED Triage Vitals  Encounter Vitals Group     BP 10/24/23 0853 (!) 189/94     Systolic BP Percentile --      Diastolic BP Percentile --      Pulse Rate 10/24/23 0853 71     Resp 10/24/23 0853 18     Temp 10/24/23 0853 98.5 F (36.9 C)     Temp Source 10/24/23 0853 Oral     SpO2 10/24/23 0853 100 %     Weight --      Height --      Head Circumference --      Peak Flow --      Pain Score 10/24/23 0849 6     Pain Loc --      Pain Education --       Exclude from Growth Chart --    No data found.  Updated Vital Signs BP (!) 189/94 (BP Location: Left Arm)   Pulse 71   Temp 98.5 F (36.9 C) (Oral)   Resp 18   SpO2  100%   Visual Acuity Right Eye Distance:   Left Eye Distance:   Bilateral Distance:    Right Eye Near:   Left Eye Near:    Bilateral Near:     Physical Exam Constitutional:      General: He is not in acute distress.    Appearance: Normal appearance. He is not toxic-appearing or diaphoretic.  HENT:     Head: Normocephalic and atraumatic.  Eyes:     Extraocular Movements: Extraocular movements intact.     Conjunctiva/sclera: Conjunctivae normal.  Cardiovascular:     Rate and Rhythm: Normal rate and regular rhythm.     Pulses: Normal pulses.     Heart sounds: Normal heart sounds.  Pulmonary:     Effort: Pulmonary effort is normal. No respiratory distress.     Breath sounds: Normal breath sounds.  Musculoskeletal:     Comments: Patient has circumferential nonpitting edema present to right ankle.  No swelling to foot.  Mild tenderness to palpation to medial malleolus area.  No abrasions or lacerations.  No discoloration.  Patient can wiggle toes.  Capillary refill and pulses intact.  Neurological:     General: No focal deficit present.     Mental Status: He is alert and oriented to person, place, and time. Mental status is at baseline.     Cranial Nerves: Cranial nerves 2-12 are intact.     Sensory: Sensation is intact.     Motor: Motor function is intact.     Coordination: Coordination is intact.     Gait: Gait is intact.  Psychiatric:        Mood and Affect: Mood normal.        Behavior: Behavior normal.        Thought Content: Thought content normal.        Judgment: Judgment normal.      UC Treatments / Results  Labs (all labs ordered are listed, but only abnormal results are displayed) Labs Reviewed - No data to display  EKG   Radiology DG Ankle Complete Right Result Date:  10/24/2023 CLINICAL DATA:  ankle swelling EXAM: RIGHT ANKLE - COMPLETE 3+ VIEW COMPARISON:  August 31, 2018 FINDINGS: No acute fracture or dislocation. Degenerative changes of the tibiotalar joint. Osseous remodeling along the malleoli and talus consistent with sequela of remote prior trauma. Midfoot degenerative changes. Ankle mortise is preserved. No definitive area of erosion or osseous destruction. No unexpected radiopaque foreign body. Diffuse soft tissue edema. IMPRESSION: Diffuse soft tissue edema without acute fracture or dislocation. Electronically Signed   By: Meda Klinefelter M.D.   On: 10/24/2023 09:44    Procedures Procedures (including critical care time)  Medications Ordered in UC Medications - No data to display  Initial Impression / Assessment and Plan / UC Course  I have reviewed the triage vital signs and the nursing notes.  Pertinent labs & imaging results that were available during my care of the patient were reviewed by me and considered in my medical decision making (see chart for details).     Patient's blood pressure is mildly elevated today but patient reports blood pressure has been normal at home.  Therefore, discussed with patient to monitor blood pressure very closely over the next 24 to 48 hours and following up with PCP or urgent care if it remains elevated.  Patient's neurological exam is normal and he is not exhibiting any symptoms of hypertensive urgency so do not think that emergency evaluation is necessary.  Although, patient  was given strict ER precautions.  X-ray of right ankle completed today which was negative for any acute bony abnormality.  Low suspicion for gout as etiology at this time but this is a possibility.  Patient does have significant chronic inflammation of right ankle so I do think that podiatry referral is reasonable.  Ambulatory referral to podiatry was placed today. Will prescribe prednisone today to decrease swelling and inflammation. Also  advised supportive care including elevation of extremity.  Offered patient ultrasound of the area as well but he declined this wishing to speak with PCP first. Advised calling PCP tomorrow. Advised strict follow-up precautions.  Patient verbalized understanding and was agreeable with plan. Final Clinical Impressions(s) / UC Diagnoses   Final diagnoses:  Right ankle swelling  Elevated blood pressure reading     Discharge Instructions      Your x-ray is pending.  I have placed a referral to a podiatrist.  They should call you.  I have also prescribed you prednisone to decrease swelling.  Please elevate extremity as we discussed as well.    ED Prescriptions     Medication Sig Dispense Auth. Provider   predniSONE (DELTASONE) 20 MG tablet Take 2 tablets (40 mg total) by mouth daily for 5 days. 10 tablet Gustavus Bryant, [place]      PDMP not reviewed this encounter.   Gustavus Bryant, [place] 10/24/23 1144    Gustavus Bryant, [place] 10/24/23 1145

## 2023-10-24 NOTE — Discharge Instructions (Signed)
 Your x-ray is pending.  I have placed a referral to a podiatrist.  They should call you.  I have also prescribed you prednisone to decrease swelling.  Please elevate extremity as we discussed as well.

## 2023-10-25 ENCOUNTER — Telehealth: Payer: Self-pay

## 2023-10-25 NOTE — Telephone Encounter (Signed)
 Aware, will watch for correspondence

## 2023-10-25 NOTE — Telephone Encounter (Signed)
 Per chart review pt was seen at [facility] on 10/24/23. Sending note to Dr Milinda Antis.

## 2023-10-25 NOTE — Telephone Encounter (Signed)
 Marland Kitchen

## 2023-10-27 ENCOUNTER — Ambulatory Visit (INDEPENDENT_AMBULATORY_CARE_PROVIDER_SITE_OTHER): Admitting: Podiatry

## 2023-10-27 DIAGNOSIS — M76821 Posterior tibial tendinitis, right leg: Secondary | ICD-10-CM

## 2023-10-27 MED ORDER — METHYLPREDNISOLONE 4 MG PO TBPK: ORAL_TABLET | ORAL | 0 refills | Status: DC

## 2023-10-27 MED ORDER — MELOXICAM 15 MG PO TABS: 15.0000 mg | ORAL_TABLET | Freq: Every day | ORAL | 0 refills | Status: DC

## 2023-10-27 NOTE — Progress Notes (Signed)
 Subjective:  Patient ID: Sean Lee, male    DOB: 1960-11-25,  MRN: [id number]  Chief Complaint  Patient presents with   Foot Pain    Right ankle swelling  Pt stated that this has been going on for a while now he stated that he has had recent X-rays on 3.9.25    63 y.o. male presents with the above complaint.  Patient presents with complaint of right medial foot pain has been on for quite some time has recent x-rays done which did not show anything would like to discuss treatment options.  Pain scale 7 out of 10 dull aching nature he does not have any history of gout has not seen neurologist prior to seeing me.  Hurts with ambulation.   Review of Systems: Negative except as noted in the HPI. Denies N/V/F/Ch.  Past Medical History:  Diagnosis Date   Contact dermatitis and other eczema due to plants (except food)    H/O: CVA (cardiovascular accident)    Carotid doppler neg (4/08)   Hematuria    Obesity, unspecified    Other and unspecified hyperlipidemia    Hx - no medication/diet controll and weight loss   Sacroiliitis, not elsewhere classified (HCC)    Sleep apnea    uses CPAP but not every night   Unspecified essential hypertension    Varicose veins of left lower extremity     Current Outpatient Medications:    meloxicam (MOBIC) 15 MG tablet, Take 1 tablet (15 mg total) by mouth daily., Disp: 30 tablet, Rfl: 0   methylPREDNISolone (MEDROL DOSEPAK) 4 MG TBPK tablet, Take as directed, Disp: 21 each, Rfl: 0   colchicine 0.6 MG tablet, Take 1 tablet (0.6 mg total) by mouth 2 (two) times daily as needed., Disp: 60 tablet, Rfl: 1   latanoprost (XALATAN) 0.005 % ophthalmic solution, Place 1 drop into both eyes at bedtime., Disp: , Rfl:    olmesartan (BENICAR) 40 MG tablet, Take 1 tablet (40 mg total) by mouth daily., Disp: 90 tablet, Rfl: 3   sildenafil (VIAGRA) 50 MG tablet, TAKE 1 TABLET (50 MG TOTAL) BY MOUTH DAILY AS NEEDED FOR ERECTILE DYSFUNCTION. 30 MINUTES BEFORE SEXUAL  ACTIVITY (Patient not taking: Reported on 10/24/2023), Disp: 10 tablet, Rfl: 3   tadalafil (CIALIS) 5 MG tablet, Take 5 mg by mouth daily as needed., Disp: , Rfl:    tamsulosin (FLOMAX) 0.4 MG CAPS capsule, Take 0.4 mg by mouth at bedtime., Disp: , Rfl:    timolol (TIMOPTIC) 0.5 % ophthalmic solution, Place 1 drop into both eyes daily., Disp: , Rfl:   Social History   Tobacco Use  Smoking Status Never  Smokeless Tobacco Never    Allergies  Allergen Reactions   Benazepril Hcl Cough   Objective:  There were no vitals filed for this visit. There is no height or weight on file to calculate BMI. Constitutional Well developed. Well nourished.  Vascular Dorsalis pedis pulses palpable bilaterally. Posterior tibial pulses palpable bilaterally. Capillary refill normal to all digits.  No cyanosis or clubbing noted. Pedal hair growth normal.  Neurologic Normal speech. Oriented to person, place, and time. Epicritic sensation to light touch grossly present bilaterally.  Dermatologic Nails well groomed and normal in appearance. No open wounds. No skin lesions.  Orthopedic: Pain on palpation to the right medial foot pain along the course of the posterior tibial tendon pain with resisted inversion and plantarflexion of the foot.  No pain with dorsiflexion eversion of the foot.   Radiographs:  I reviewed her skeletally mature the right foot reviewed.  No fractures noted mild edema noted no other abnormalities noted Assessment:   1. Posterior tibial tendinitis of right leg    Plan:  Patient was evaluated and treated and all questions answered.  Right foot posterior tibial tendinitis with underlying edema -All questions and concerns were discussed with the patient in extensive detail given the amount of pain that he is having he would ultimately benefit from steroid injection however would like to hold off on the injection.  For now patient would like to discuss oral medication -Medrol Dosepak  and meloxicam was sent to the pharmacy if there is no improvement we will need to discuss injection.  No follow-ups on file.

## 2023-11-15 IMAGING — DX DG KNEE COMPLETE 4+V*L*
4 series · 4 of 4 positions shown · non-contrast
Comparison: None Available.

CLINICAL DATA: Left medial knee pain. Clinical concern for
osteoarthritis.

EXAM:
LEFT KNEE - COMPLETE 4+ VIEW

[knee ap]
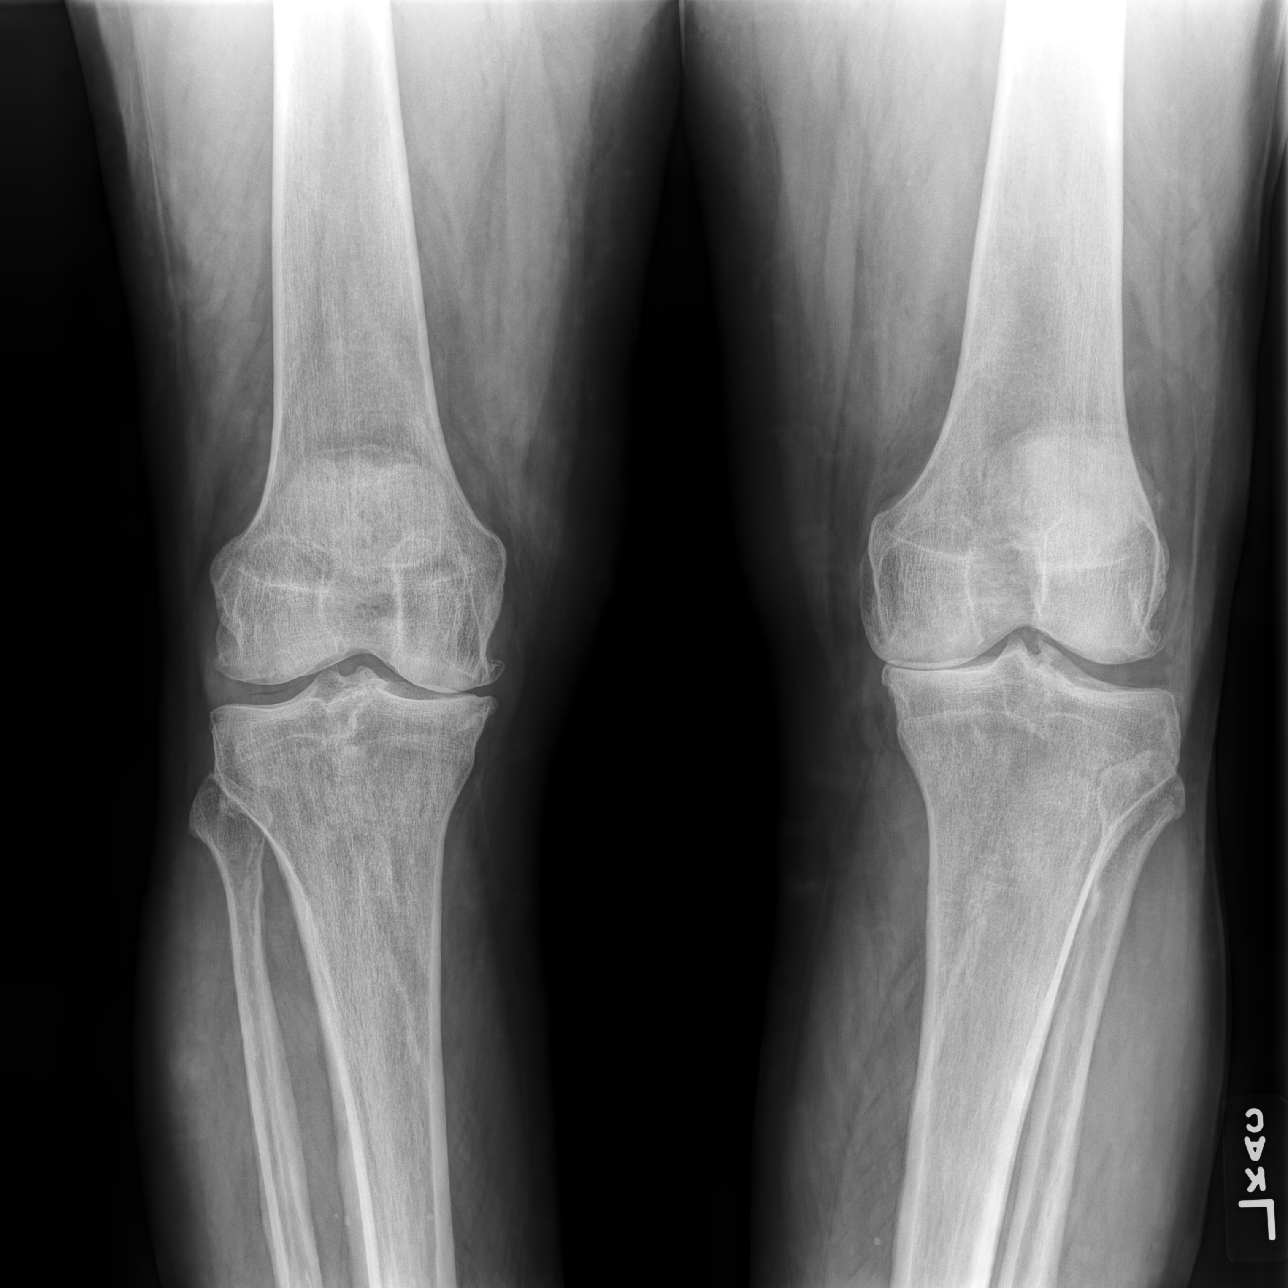

[knee lat]
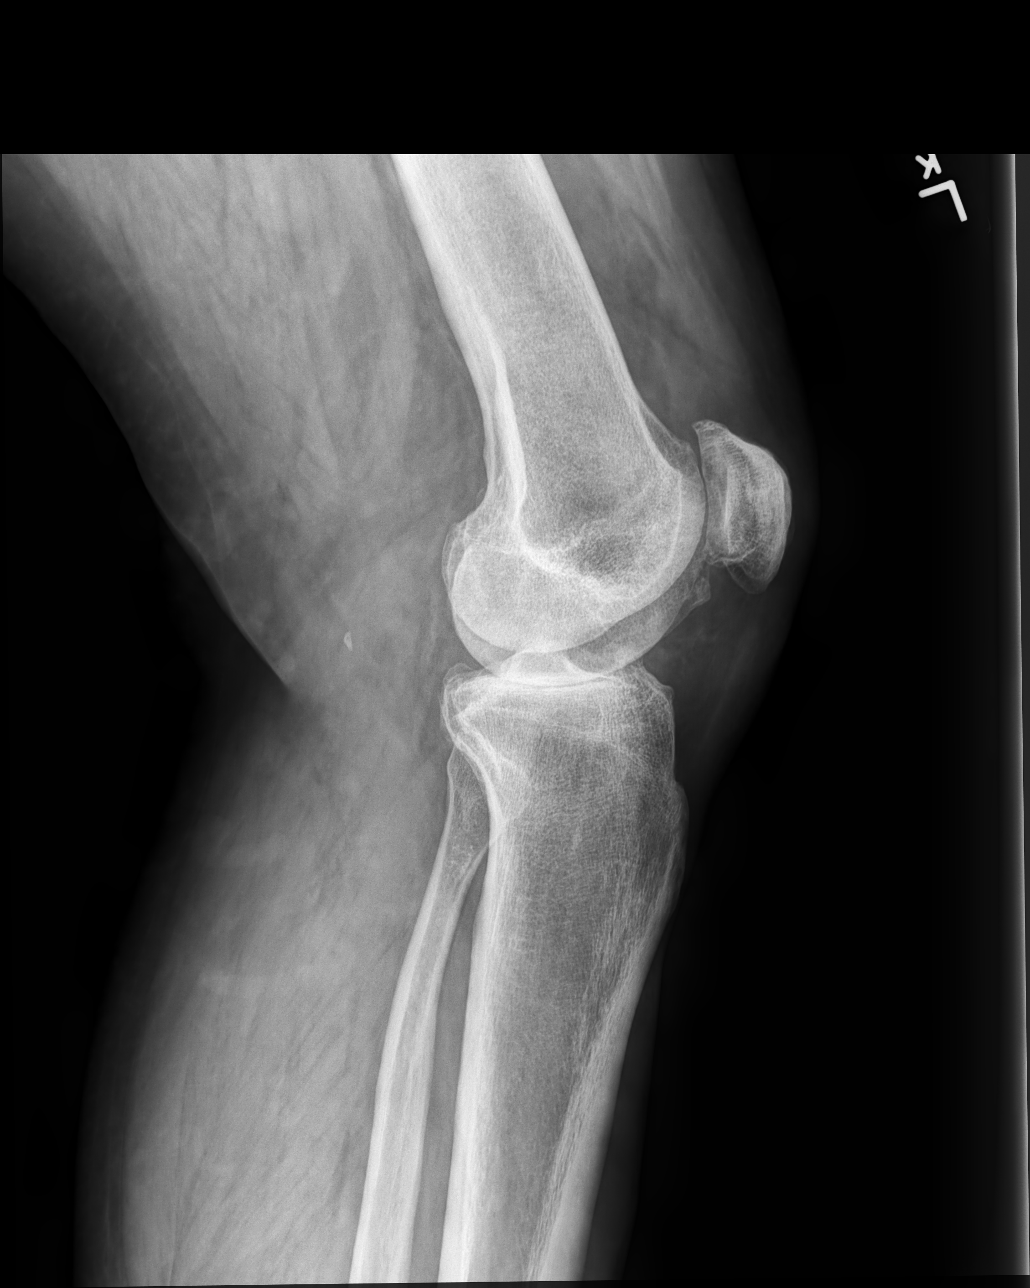

[knee [person_name] view pa]
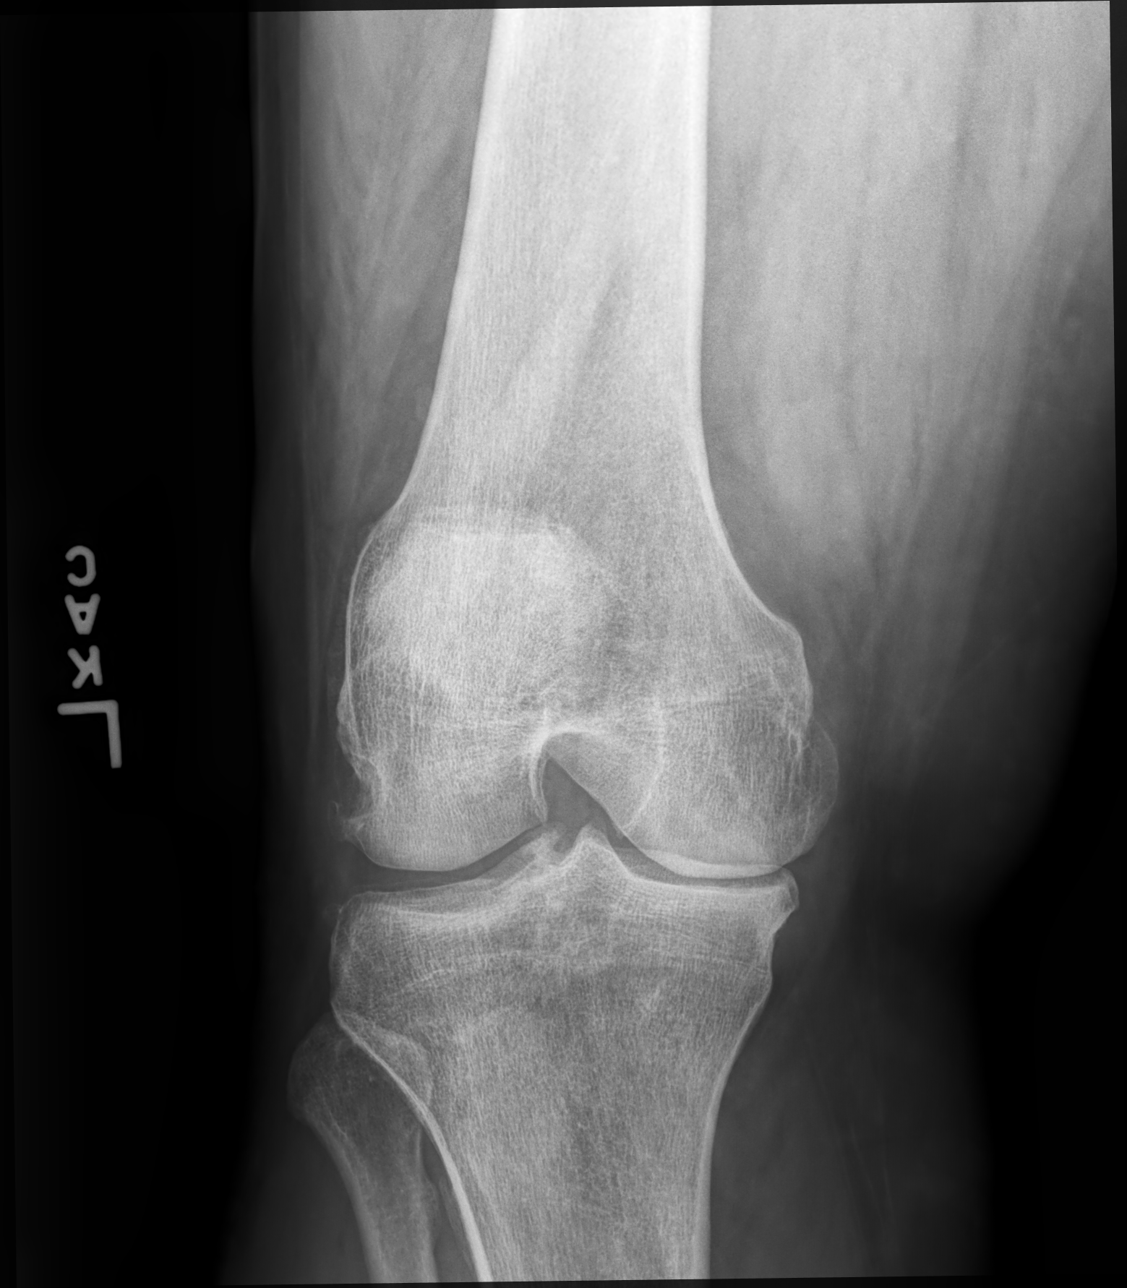

[patella (sunrise) tan]
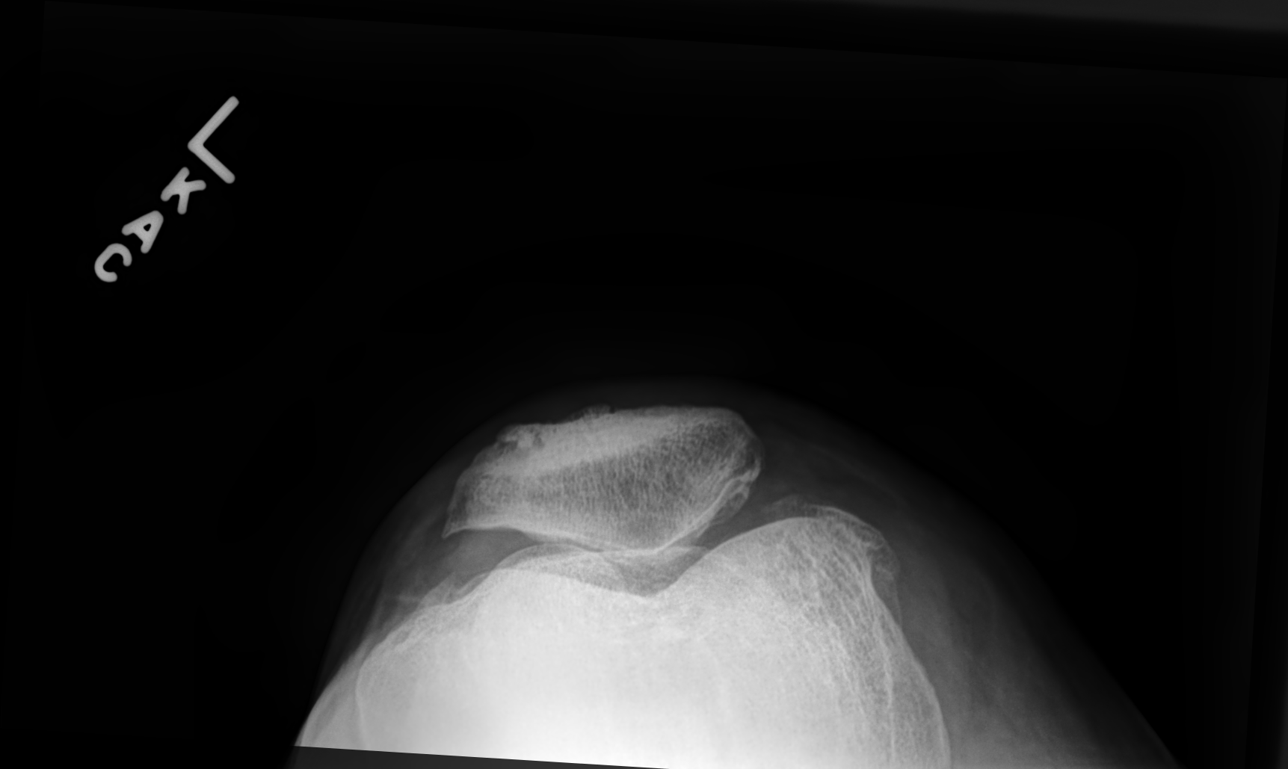

[4 of 4 positions shown; findings below may reference images not displayed]

FINDINGS: Medial joint space narrowing is noted. Tricompartmental degenerative
changes are seen. No joint effusion is noted. No acute bony
abnormality is seen.
IMPRESSION: Tricompartmental degenerative changes without acute abnormality.

## 2023-11-24 ENCOUNTER — Ambulatory Visit: Admitting: Podiatry

## 2023-11-27 ENCOUNTER — Other Ambulatory Visit: Payer: Self-pay | Admitting: Podiatry

## 2023-12-15 ENCOUNTER — Ambulatory Visit (INDEPENDENT_AMBULATORY_CARE_PROVIDER_SITE_OTHER): Admitting: Podiatry

## 2023-12-15 DIAGNOSIS — Q666 Other congenital valgus deformities of feet: Secondary | ICD-10-CM

## 2023-12-15 DIAGNOSIS — M76821 Posterior tibial tendinitis, right leg: Secondary | ICD-10-CM | POA: Diagnosis not present

## 2023-12-15 NOTE — Progress Notes (Signed)
 Subjective:  Patient ID: Sean Lee, male    DOB: Jan 05, 1961,  MRN: [id number]  Chief Complaint  Patient presents with   Foot Pain    Posterior tibial tendinitis of right leg follow up pt stated that he still has pain when he walks but the swelling has gone down some     63 y.o. male presents with the above complaint.  Patient presents with complaint of right posterior tibial tendinitis he states doing little bit better.  He still has some pain when he is walking but the swelling has gone down with medication.  He would like to discuss  Plan   Review of Systems: Negative except as noted in the HPI. Denies N/V/F/Ch.  Past Medical History:  Diagnosis Date   Contact dermatitis and other eczema due to plants (except food)    H/O: CVA (cardiovascular accident)    Carotid doppler neg (4/08)   Hematuria    Obesity, unspecified    Other and unspecified hyperlipidemia    Hx - no medication/diet controll and weight loss   Sacroiliitis, not elsewhere classified (HCC)    Sleep apnea    uses CPAP but not every night   Unspecified essential hypertension    Varicose veins of left lower extremity     Current Outpatient Medications:    colchicine  0.6 MG tablet, Take 1 tablet (0.6 mg total) by mouth 2 (two) times daily as needed., Disp: 60 tablet, Rfl: 1   latanoprost (XALATAN) 0.005 % ophthalmic solution, Place 1 drop into both eyes at bedtime., Disp: , Rfl:    meloxicam  (MOBIC ) 15 MG tablet, TAKE 1 TABLET (15 MG TOTAL) BY MOUTH DAILY., Disp: 30 tablet, Rfl: 0   methylPREDNISolone  (MEDROL  DOSEPAK) 4 MG TBPK tablet, Take as directed, Disp: 21 each, Rfl: 0   olmesartan  (BENICAR ) 40 MG tablet, Take 1 tablet (40 mg total) by mouth daily., Disp: 90 tablet, Rfl: 3   sildenafil  (VIAGRA ) 50 MG tablet, TAKE 1 TABLET (50 MG TOTAL) BY MOUTH DAILY AS NEEDED FOR ERECTILE DYSFUNCTION. 30 MINUTES BEFORE SEXUAL ACTIVITY (Patient not taking: Reported on 10/24/2023), Disp: 10 tablet, Rfl: 3   tadalafil  (CIALIS) 5 MG tablet, Take 5 mg by mouth daily as needed., Disp: , Rfl:    tamsulosin (FLOMAX) 0.4 MG CAPS capsule, Take 0.4 mg by mouth at bedtime., Disp: , Rfl:    timolol (TIMOPTIC) 0.5 % ophthalmic solution, Place 1 drop into both eyes daily., Disp: , Rfl:   Social History   Tobacco Use  Smoking Status Never  Smokeless Tobacco Never    Allergies  Allergen Reactions   Benazepril Hcl Cough   Objective:  There were no vitals filed for this visit. There is no height or weight on file to calculate BMI. Constitutional Well developed. Well nourished.  Vascular Dorsalis pedis pulses palpable bilaterally. Posterior tibial pulses palpable bilaterally. Capillary refill normal to all digits.  No cyanosis or clubbing noted. Pedal hair growth normal.  Neurologic Normal speech. Oriented to person, place, and time. Epicritic sensation to light touch grossly present bilaterally.  Dermatologic Nails well groomed and normal in appearance. No open wounds. No skin lesions.  Orthopedic: Pain on palpation to the right medial foot pain along the course of the posterior tibial tendon pain with resisted inversion and plantarflexion of the foot.  No pain with dorsiflexion eversion of the foot.   Radiographs: I reviewed her skeletally mature the right foot reviewed.  No fractures noted mild edema noted no other abnormalities noted  Assessment:   1. Posterior tibial tendinitis of right leg   2. Pes planovalgus     Plan:  Patient was evaluated and treated and all questions answered.  Right foot posterior tibial tendinitis with underlying edema -All questions and concerns were discussed with the patient in extensive detail given that patient has some improvement but not overall completely improved I believe she will benefit from a steroid injection to help decrease inflammatory component associate with pain.  Patient agrees with plan like to proceed with steroid injection.  I discussed the risk of  rupture associated with injection.  He would like to proceed despite the risks -A steroid injection was performed at right medial foot using 1% plain Lidocaine  and 10 mg of Kenalog. This was well tolerated.   Pes planovalgus -I explained to patient the etiology of pes planovalgus and relationship with Planter fasciitis and various treatment options were discussed.  Given patient foot structure in the setting of Planter fasciitis I believe patient will benefit from custom-made orthotics to help control the hindfoot motion support the arch of the foot and take the stress away from plantar fascial.  Patient agrees with the plan like to proceed with orthotics -Patient was casted for orthotics   Pesplanovalgus orhtotics

## 2024-01-12 ENCOUNTER — Ambulatory Visit: Admitting: Podiatry

## 2024-01-19 ENCOUNTER — Encounter: Payer: Self-pay | Admitting: Family Medicine

## 2024-01-19 ENCOUNTER — Ambulatory Visit (INDEPENDENT_AMBULATORY_CARE_PROVIDER_SITE_OTHER)
Admission: RE | Admit: 2024-01-19 | Discharge: 2024-01-19 | Disposition: A | Discharge status: Home / Self Care | Source: Ambulatory Visit | Attending: Family Medicine | Admitting: Family Medicine

## 2024-01-19 ENCOUNTER — Ambulatory Visit: Payer: Self-pay | Admitting: Family Medicine

## 2024-01-19 ENCOUNTER — Ambulatory Visit (INDEPENDENT_AMBULATORY_CARE_PROVIDER_SITE_OTHER): Admitting: Family Medicine

## 2024-01-19 VITALS — BP 138/84 | HR 76 | Temp 98.5°F | Ht 73.0 in | Wt 268.0 lb

## 2024-01-19 DIAGNOSIS — Z131 Encounter for screening for diabetes mellitus: Secondary | ICD-10-CM | POA: Insufficient documentation

## 2024-01-19 DIAGNOSIS — E7800 Pure hypercholesterolemia, unspecified: Secondary | ICD-10-CM | POA: Diagnosis not present

## 2024-01-19 DIAGNOSIS — M25571 Pain in right ankle and joints of right foot: Secondary | ICD-10-CM | POA: Diagnosis not present

## 2024-01-19 DIAGNOSIS — R058 Other specified cough: Secondary | ICD-10-CM | POA: Diagnosis not present

## 2024-01-19 DIAGNOSIS — Z6835 Body mass index (BMI) 35.0-35.9, adult: Secondary | ICD-10-CM

## 2024-01-19 DIAGNOSIS — I10 Essential (primary) hypertension: Secondary | ICD-10-CM | POA: Diagnosis not present

## 2024-01-19 DIAGNOSIS — G8929 Other chronic pain: Secondary | ICD-10-CM

## 2024-01-19 DIAGNOSIS — Z125 Encounter for screening for malignant neoplasm of prostate: Secondary | ICD-10-CM

## 2024-01-19 MED ORDER — AMOXICILLIN-POT CLAVULANATE 875-125 MG PO TABS: 1.0000 | ORAL_TABLET | Freq: Two times a day (BID) | ORAL | 0 refills | Status: DC

## 2024-01-19 MED ORDER — OLMESARTAN MEDOXOMIL-HCTZ 40-25 MG PO TABS: 1.0000 | ORAL_TABLET | Freq: Every day | ORAL | 0 refills | Status: DC

## 2024-01-19 MED ORDER — BENZONATATE 200 MG PO CAPS: 200.0000 mg | ORAL_CAPSULE | Freq: Three times a day (TID) | ORAL | 1 refills | Status: DC | PRN

## 2024-01-19 NOTE — Assessment & Plan Note (Addendum)
 For over a month May have started with viral uri  Productive Reassuring exam/no wheeze today   Cxr ordered -reassuring report  In light of length of illness - augmentin ordered Tessalon  prescription  Also encouraged to try delsym over the counter if needed   Will re check at follow up for annual exam

## 2024-01-19 NOTE — Assessment & Plan Note (Signed)
 bp in fair control at this time but was better with hct BP Readings from Last 1 Encounters:  01/19/24 138/84   Since pt is not having gout issues/ will return to olmesartan  hct 40-25 mg daily  Instructed to call if any side effects  Lab and annual exam planned  Most recent labs reviewed  Disc lifstyle change with low sodium diet and exercise

## 2024-01-19 NOTE — Progress Notes (Signed)
 Subjective:    Patient ID: Sean Lee, male    DOB: 02/23/1961, 63 y.o.   MRN: [id number]  HPI  Wt Readings from Last 3 Encounters:  01/19/24 268 lb (121.6 kg)  09/01/23 277 lb (125.6 kg)  12/09/22 256 lb (116.1 kg)   35.36 kg/m  Vitals:   01/19/24 0949  BP: 138/84  Pulse: 76  Temp: 98.5 F (36.9 C)  SpO2: 98%    Pt presents for follow up of HTN Also cough-over a month   Cough- productive / yellow green Rattling in chest  Started with a tickle in his throat  Some runny nose periodically and congestion  No fever No shortness of breath  No wheezing   Nasal symptoms are ok  Did not do a covid test    bp is stable today  No cp or palpitations or headaches or edema  No side effects to medicines  BP Readings from Last 3 Encounters:  01/19/24 138/84  10/24/23 (!) 189/94  09/01/23 110/74     Olmesartan  40 mg daily  Previously on this with hydrochlorothiazide  25 mg but this was stopped due to possible gout  Then seen in UC - gout was more doubtful as cause of chronic ankle pain  He then saw podiatry and dx with posterior tibial tendonitis with pes planovalgus / plantar fasciitis and orthotics were made/ injection given and prednisone    Taking chlorcedin   Cxr today DG Chest 2 View Result Date: 01/19/2024 CLINICAL DATA:  Productive cough EXAM: CHEST - 2 VIEW COMPARISON:  January 19, 2021 FINDINGS: The heart size and mediastinal contours are within normal limits. Both lungs are clear. The visualized skeletal structures are unremarkable. IMPRESSION: No active cardiopulmonary disease. Electronically Signed   By: Fredrich Jefferson M.D.   On: 01/19/2024 11:14      Does take tamsulosin 0.4 for BPH   Colchicine  Meloxicam     Lab Results  Component Value Date   LABURIC 7.7 07/25/2019    Lab Results  Component Value Date   NA 137 06/11/2022   K 3.8 06/11/2022   CO2 25 06/11/2022   GLUCOSE 101 (H) 06/11/2022   BUN 20 06/11/2022   CREATININE 1.08 06/11/2022    CALCIUM 9.0 06/11/2022   GFR 68.92 12/18/2021   GFRNONAA >60 06/11/2022   Lab Results  Component Value Date   ALT 27 12/18/2021   AST 24 12/18/2021   ALKPHOS 55 12/18/2021   BILITOT 0.9 12/18/2021   Lab Results  Component Value Date   TSH 0.60 12/18/2021   Lab Results  Component Value Date   WBC 14.6 (H) 06/19/2022   HGB 11.7 (L) 06/19/2022   HCT 33.3 (L) 06/19/2022   MCV 96.0 06/19/2022   PLT 228 06/19/2022    Prediabetes Lab Results  Component Value Date   HGBA1C 6.2 11/05/2022   HGBA1C 6.3 12/18/2021   HGBA1C 6.4 04/02/2020     Lab Results  Component Value Date   CHOL 146 12/18/2021   HDL 45.70 12/18/2021   LDLCALC 67 12/18/2021   TRIG 167.0 (H) 12/18/2021   CHOLHDL 3 12/18/2021   Overdue for labs and annual exam   Patient Active Problem List   Diagnosis Date Noted   Productive cough 01/19/2024   Diabetes mellitus screening 01/19/2024   Nocturia 11/05/2022   Status post total left knee replacement 06/18/2022   Primary osteoarthritis of left knee 01/01/2022   Primary osteoarthritis of right knee 01/01/2022   Left knee pain 12/18/2021  Umbilical hernia 12/18/2021   Erectile dysfunction 12/31/2020   Insomnia 04/02/2020   Right ankle swelling 07/25/2019   Chronic pain of right ankle 08/31/2018   Routine general medical examination at a health care facility 11/01/2017   Prostate cancer screening 06/28/2017   Alopecia 11/04/2016   Back pain 05/06/2016   Sleep disorder 08/28/2015   Colon cancer screening 08/28/2015   Prediabetes 01/02/2013   Varicosities of leg 03/21/2012   Rectus diastasis of lower abdomen 03/21/2012   Obstructive sleep apnea 10/15/2009   Severe obesity (BMI 35.0-35.9 with comorbidity) (HCC) 09/02/2009   Hyperlipidemia 02/02/2007   HEMATURIA, MICROSCOPIC 02/02/2007   Essential hypertension 02/01/2007   History of cardiovascular disorder 02/01/2007   Past Medical History:  Diagnosis Date   Contact dermatitis and other eczema  due to plants (except food)    H/O: CVA (cardiovascular accident)    Carotid doppler neg (4/08)   Hematuria    Obesity, unspecified    Other and unspecified hyperlipidemia    Hx - no medication/diet controll and weight loss   Sacroiliitis, not elsewhere classified (HCC)    Sleep apnea    uses CPAP but not every night   Unspecified essential hypertension    Varicose veins of left lower extremity    Past Surgical History:  Procedure Laterality Date   Carotid dopplers  11/16/2006   Negative   HERNIA REPAIR     umbilical   TOTAL KNEE ARTHROPLASTY Left 06/18/2022   TOTAL KNEE ARTHROPLASTY Left 06/18/2022   Procedure: LEFT TOTAL KNEE ARTHROPLASTY;  Surgeon: Wes Hamman, MD;  Location: [facility] OR;  Service: Orthopedics;  Laterality: Left;   Social History   Tobacco Use   Smoking status: Never   Smokeless tobacco: Never  Vaping Use   Vaping status: Never Used  Substance Use Topics   Alcohol use: Yes    Alcohol/week: 4.0 standard drinks of alcohol    Types: 4 Cans of beer per week    Comment: weekends- iccasional   Drug use: No   Family History  Problem Relation Age of Onset   Hypertension Mother    Diabetes Mother    Esophageal cancer Mother    Prostate cancer Paternal Uncle    Prostate cancer Paternal Uncle    Hyperlipidemia Other        in family    Cancer Neg Hx    Rectal cancer Neg Hx    Stomach cancer Neg Hx    Colon cancer Neg Hx    Colon polyps Neg Hx    Allergies  Allergen Reactions   Benazepril Hcl Cough   Current Outpatient Medications on File Prior to Visit  Medication Sig Dispense Refill   latanoprost (XALATAN) 0.005 % ophthalmic solution Place 1 drop into both eyes at bedtime.     meloxicam  (MOBIC ) 15 MG tablet TAKE 1 TABLET (15 MG TOTAL) BY MOUTH DAILY. 30 tablet 0   sildenafil  (VIAGRA ) 50 MG tablet TAKE 1 TABLET (50 MG TOTAL) BY MOUTH DAILY AS NEEDED FOR ERECTILE DYSFUNCTION. 30 MINUTES BEFORE SEXUAL ACTIVITY 10 tablet 3   tadalafil (CIALIS) 5 MG tablet  Take 5 mg by mouth daily as needed.     tamsulosin (FLOMAX) 0.4 MG CAPS capsule Take 0.4 mg by mouth at bedtime.     timolol (TIMOPTIC) 0.5 % ophthalmic solution Place 1 drop into both eyes daily.     No current facility-administered medications on file prior to visit.    Review of Systems     Objective:  Physical Exam Constitutional:      General: He is not in acute distress.    Appearance: Normal appearance. He is well-developed. He is obese. He is not ill-appearing or diaphoretic.  HENT:     Head: Normocephalic and atraumatic.     Right Ear: Tympanic membrane and ear canal normal.     Left Ear: Tympanic membrane and ear canal normal.     Nose: Rhinorrhea present.     Comments: Boggy nares     Mouth/Throat:     Mouth: Mucous membranes are moist.     Pharynx: No oropharyngeal exudate or posterior oropharyngeal erythema.  Eyes:     General:        Right eye: No discharge.        Left eye: No discharge.     Conjunctiva/sclera: Conjunctivae normal.     Pupils: Pupils are equal, round, and reactive to light.  Neck:     Thyroid : No thyromegaly.     Vascular: No carotid bruit or JVD.  Cardiovascular:     Rate and Rhythm: Normal rate and regular rhythm.     Heart sounds: Normal heart sounds.     No gallop.  Pulmonary:     Effort: Pulmonary effort is normal. No respiratory distress.     Breath sounds: Normal breath sounds. No stridor. No wheezing, rhonchi or rales.     Comments: Harsh bs  No rales or rhonchi   No wheezing  Cough sounds wet  Chest:     Chest wall: No tenderness.  Abdominal:     General: There is no distension or abdominal bruit.     Palpations: Abdomen is soft.  Musculoskeletal:     Cervical back: Normal range of motion and neck supple.     Right lower leg: No edema.     Left lower leg: No edema.  Lymphadenopathy:     Cervical: No cervical adenopathy.  Skin:    General: Skin is warm and dry.     Coloration: Skin is not pale.     Findings: No rash.   Neurological:     Mental Status: He is alert.     Coordination: Coordination normal.     Deep Tendon Reflexes: Reflexes are normal and symmetric. Reflexes normal.  Psychiatric:        Mood and Affect: Mood normal.           Assessment & Plan:   Problem List Items Addressed This Visit       Cardiovascular and Mediastinum   Essential hypertension   bp in fair control at this time but was better with hct BP Readings from Last 1 Encounters:  01/19/24 138/84   Since pt is not having gout issues/ will return to olmesartan  hct 40-25 mg daily  Instructed to call if any side effects  Lab and annual exam planned  Most recent labs reviewed  Disc lifstyle change with low sodium diet and exercise        Relevant Medications   olmesartan -hydrochlorothiazide  (BENICAR  HCT) 40-25 MG tablet   Other Relevant Orders   TSH   Lipid panel   CBC with Differential/Platelet   Comprehensive metabolic panel with GFR     Other   Severe obesity (BMI 35.0-35.9 with comorbidity) (HCC)   Relevant Orders   Hemoglobin A1c   Prostate cancer screening   Relevant Orders   PSA   Productive cough - Primary   For over a month May have started with viral uri  Productive Reassuring exam/no wheeze today   Cxr ordered -reassuring report  In light of length of illness - augmentin ordered Tessalon  prescription  Also encouraged to try delsym over the counter if needed   Will re check at follow up for annual exam       Relevant Orders   DG Chest 2 View (Completed)   Hyperlipidemia   Relevant Medications   olmesartan -hydrochlorothiazide  (BENICAR  HCT) 40-25 MG tablet   Other Relevant Orders   Lipid panel   Comprehensive metabolic panel with GFR   Diabetes mellitus screening   Relevant Orders   Hemoglobin A1c   Chronic pain of right ankle   Seeing podiatry for tendonitis - had prednisone , shot  Orthotics and boot (when not driving)

## 2024-01-19 NOTE — Patient Instructions (Addendum)
 Chest xray today  We will reach out with results    Take augmentin - antibiotic Try tessalon  for cough  Try over the counter delsym for cough also   Update if not starting to improve in a week or if worsening   Go back to olmesartan -hct for blood pressure    Schedule annual exam with fasting labs prior at check out

## 2024-01-19 NOTE — Assessment & Plan Note (Signed)
 Seeing podiatry for tendonitis - had prednisone , shot  Orthotics and boot (when not driving)

## 2024-01-26 ENCOUNTER — Ambulatory Visit (INDEPENDENT_AMBULATORY_CARE_PROVIDER_SITE_OTHER): Admitting: Podiatry

## 2024-01-26 DIAGNOSIS — Z9889 Other specified postprocedural states: Secondary | ICD-10-CM

## 2024-01-26 DIAGNOSIS — M76821 Posterior tibial tendinitis, right leg: Secondary | ICD-10-CM

## 2024-01-26 NOTE — Progress Notes (Signed)
 Subjective:  Patient ID: Sean Lee, male    DOB: [birth year],  MRN: [id number]  Chief Complaint  Patient presents with   Posterior tibial tendinitis of right leg    Pt stated that things are about the same he stated that he does have some discomfort this morning     63 y.o. male presents with the above complaint.  Patient presents with complaint of right posterior tibial tendinitis he states is about the same has not gotten better he is here for next review and plan denies any other acute complaints.   Review of Systems: Negative except as noted in the HPI. Denies N/V/F/Ch.  Past Medical History:  Diagnosis Date   Contact dermatitis and other eczema due to plants (except food)    H/O: CVA (cardiovascular accident)    Carotid doppler neg (4/08)   Hematuria    Obesity, unspecified    Other and unspecified hyperlipidemia    Hx - no medication/diet controll and weight loss   Sacroiliitis, not elsewhere classified (HCC)    Sleep apnea    uses CPAP but not every night   Unspecified essential hypertension    Varicose veins of left lower extremity     Current Outpatient Medications:    amoxicillin -clavulanate (AUGMENTIN ) 875-125 MG tablet, Take 1 tablet by mouth 2 (two) times daily., Disp: 14 tablet, Rfl: 0   benzonatate  (TESSALON ) 200 MG capsule, Take 1 capsule (200 mg total) by mouth 3 (three) times daily as needed for cough. Swallow whole, Disp: 20 capsule, Rfl: 1   latanoprost (XALATAN) 0.005 % ophthalmic solution, Place 1 drop into both eyes at bedtime., Disp: , Rfl:    meloxicam  (MOBIC ) 15 MG tablet, TAKE 1 TABLET (15 MG TOTAL) BY MOUTH DAILY., Disp: 30 tablet, Rfl: 0   olmesartan -hydrochlorothiazide  (BENICAR  HCT) 40-25 MG tablet, Take 1 tablet by mouth daily., Disp: 90 tablet, Rfl: 0   sildenafil  (VIAGRA ) 50 MG tablet, TAKE 1 TABLET (50 MG TOTAL) BY MOUTH DAILY AS NEEDED FOR ERECTILE DYSFUNCTION. 30 MINUTES BEFORE SEXUAL ACTIVITY, Disp: 10 tablet, Rfl: 3   tadalafil (CIALIS)  5 MG tablet, Take 5 mg by mouth daily as needed., Disp: , Rfl:    tamsulosin (FLOMAX) 0.4 MG CAPS capsule, Take 0.4 mg by mouth at bedtime., Disp: , Rfl:    timolol (TIMOPTIC) 0.5 % ophthalmic solution, Place 1 drop into both eyes daily., Disp: , Rfl:   Social History   Tobacco Use  Smoking Status Never  Smokeless Tobacco Never    Allergies  Allergen Reactions   Benazepril Hcl Cough   Objective:  There were no vitals filed for this visit. There is no height or weight on file to calculate BMI. Constitutional Well developed. Well nourished.  Vascular Dorsalis pedis pulses palpable bilaterally. Posterior tibial pulses palpable bilaterally. Capillary refill normal to all digits.  No cyanosis or clubbing noted. Pedal hair growth normal.  Neurologic Normal speech. Oriented to person, place, and time. Epicritic sensation to light touch grossly present bilaterally.  Dermatologic Nails well groomed and normal in appearance. No open wounds. No skin lesions.  Orthopedic: Pain on palpation to the right medial foot pain along the course of the posterior tibial tendon pain with resisted inversion and plantarflexion of the foot.  No pain with dorsiflexion eversion of the foot.   Radiographs: I reviewed her skeletally mature the right foot reviewed.  No fractures noted mild edema noted no other abnormalities noted Assessment:   1. Posterior tibial tendinitis of right leg  2. S/P foot surgery     Plan:  Patient was evaluated and treated and all questions answered.  Right foot posterior tibial tendinitis with underlying edema -Given the patient is to be hard I believe patient benefit from an MRI.  MRI was ordered.  Will rule out tearing of the posterior tibial tendon.   Pes planovalgus -I explained to patient the etiology of pes planovalgus and relationship with Planter fasciitis and various treatment options were discussed.  Given patient foot structure in the setting of Planter  fasciitis I believe patient will benefit from custom-made orthotics to help control the hindfoot motion support the arch of the foot and take the stress away from plantar fascial.  Patient agrees with the plan like to proceed with orthotics -Patient was casted for orthotics

## 2024-02-02 ENCOUNTER — Ambulatory Visit: Admitting: Family Medicine

## 2024-02-02 ENCOUNTER — Encounter: Payer: Self-pay | Admitting: Family Medicine

## 2024-02-02 VITALS — BP 130/68 | HR 75 | Temp 98.4°F | Ht 70.5 in | Wt 271.2 lb

## 2024-02-02 DIAGNOSIS — Z1211 Encounter for screening for malignant neoplasm of colon: Secondary | ICD-10-CM

## 2024-02-02 DIAGNOSIS — E7800 Pure hypercholesterolemia, unspecified: Secondary | ICD-10-CM | POA: Diagnosis not present

## 2024-02-02 DIAGNOSIS — Z6838 Body mass index (BMI) 38.0-38.9, adult: Secondary | ICD-10-CM

## 2024-02-02 DIAGNOSIS — E66812 Obesity, class 2: Secondary | ICD-10-CM

## 2024-02-02 DIAGNOSIS — Z0000 Encounter for general adult medical examination without abnormal findings: Secondary | ICD-10-CM

## 2024-02-02 DIAGNOSIS — Z125 Encounter for screening for malignant neoplasm of prostate: Secondary | ICD-10-CM

## 2024-02-02 DIAGNOSIS — N400 Enlarged prostate without lower urinary tract symptoms: Secondary | ICD-10-CM | POA: Insufficient documentation

## 2024-02-02 DIAGNOSIS — Z114 Encounter for screening for human immunodeficiency virus [HIV]: Secondary | ICD-10-CM | POA: Insufficient documentation

## 2024-02-02 DIAGNOSIS — Z1159 Encounter for screening for other viral diseases: Secondary | ICD-10-CM | POA: Insufficient documentation

## 2024-02-02 DIAGNOSIS — G8929 Other chronic pain: Secondary | ICD-10-CM

## 2024-02-02 DIAGNOSIS — R058 Other specified cough: Secondary | ICD-10-CM

## 2024-02-02 DIAGNOSIS — R350 Frequency of micturition: Secondary | ICD-10-CM

## 2024-02-02 DIAGNOSIS — Z131 Encounter for screening for diabetes mellitus: Secondary | ICD-10-CM | POA: Diagnosis not present

## 2024-02-02 DIAGNOSIS — N401 Enlarged prostate with lower urinary tract symptoms: Secondary | ICD-10-CM

## 2024-02-02 DIAGNOSIS — R7303 Prediabetes: Secondary | ICD-10-CM

## 2024-02-02 DIAGNOSIS — I10 Essential (primary) hypertension: Secondary | ICD-10-CM

## 2024-02-02 DIAGNOSIS — K429 Umbilical hernia without obstruction or gangrene: Secondary | ICD-10-CM

## 2024-02-02 NOTE — Assessment & Plan Note (Signed)
 More bothersome Referral made to gen surg

## 2024-02-02 NOTE — Assessment & Plan Note (Signed)
 Psa today  Has BPH  For urology follow up soon

## 2024-02-02 NOTE — Progress Notes (Signed)
 Subjective:    Patient ID: Sean Lee, male    DOB: [birth year], 63 y.o.   MRN: [id number]  HPI  Here for health maintenance exam and to review chronic medical problems   Wt Readings from Last 3 Encounters:  02/02/24 271 lb 4 oz (123 kg)  01/19/24 268 lb (121.6 kg)  09/01/23 277 lb (125.6 kg)   38.37 kg/m  Vitals:   02/02/24 1428  BP: 130/68  Pulse: 75  Temp: 98.4 F (36.9 C)  SpO2: 97%    Immunization History  Administered Date(s) Administered   Influenza, Mdck, Trivalent,PF 6+ MOS(egg free) 07/29/2023   Influenza,inj,Quad PF,6+ Mos 05/08/2013, 06/06/2015, 05/26/2017, 09/08/2018, 07/25/2019, 06/19/2022   Moderna Covid-19 Fall Seasonal Vaccine 53yrs & older 07/29/2023   Moderna Covid-19 Vaccine Bivalent Booster 53yrs & up 05/13/2021   Moderna Sars-Covid-2 Vaccination 10/22/2019, 11/19/2019   Pneumococcal Polysaccharide-23 05/08/2013   Td 02/02/2007   Tdap 11/04/2016    Health Maintenance Due  Topic Date Due   HIV Screening  Never done   Diabetic kidney evaluation - Urine ACR  Never done   Hepatitis C Screening  Never done   Pneumococcal Vaccine 51-43 Years old (2 of 2 - PCV) 05/08/2014   FOOT EXAM  05/08/2014   OPHTHALMOLOGY EXAM  06/19/2014   Colonoscopy  10/16/2020   HEMOGLOBIN A1C  05/08/2023   Diabetic kidney evaluation - eGFR measurement  06/12/2023    Seen recently for cough Normal cxr Treatment with augmentin   Gradually improving  Tessalon  helps    Hep C/ HIV screen  Interested     Prostate health Lab Results  Component Value Date   PSA 1.02 11/05/2022   PSA 0.87 04/02/2020   PSA 0.82 11/03/2018  Takes flomax for bph - is helping urination  Also sindelafil   No changes in urination  Has urology follow up upcoming     Colon cancer screening  colonoscopy 10/2015 with 5 y recall He had to re schedule last time    Bone health   Falls Fractures Supplements -omega 3    Exercise  Chair exercise  Not using  weight   Mood    02/02/2024    2:32 PM 01/19/2024   10:10 AM 11/05/2022    8:04 AM 12/18/2021   10:41 AM 04/02/2020   10:23 AM  Depression screen PHQ 2/9  Decreased Interest 0 1 1 2 1   Down, Depressed, Hopeless 1 0 0 1 1  PHQ - 2 Score 1 1 1 3 2   Altered sleeping 2 1 0 0 3  Tired, decreased energy 0 0 0 2 1  Change in appetite 1 0 1 2 2   Feeling bad or failure about yourself  1 0 0 0 2  Trouble concentrating 0 1 0 0 0  Moving slowly or fidgety/restless 0 0 0 0 0  Suicidal thoughts 0 0 0 0 0  PHQ-9 Score 5 3 2 7 10   Difficult doing work/chores Somewhat difficult Not difficult at all Not difficult at all  Somewhat difficult   Some fatigue  Uses cpap machine- hard time falling to sleep   Some stressors  Declines counseling  Lost his mother last year- brought up some old feelings     HTN bp is stable today  No cp or palpitations or headaches or edema  No side effects to medicines  BP Readings from Last 3 Encounters:  02/02/24 130/68  01/19/24 138/84  10/24/23 (!) 189/94     Lab Results  Component  Value Date   NA 137 06/11/2022   K 3.8 06/11/2022   CO2 25 06/11/2022   GLUCOSE 101 (H) 06/11/2022   BUN 20 06/11/2022   CREATININE 1.08 06/11/2022   CALCIUM 9.0 06/11/2022   GFR 68.92 12/18/2021   GFRNONAA >60 06/11/2022   Olmesartan  hct 40-25 mg daily   Hyperlipidemia Lab Results  Component Value Date   CHOL 146 12/18/2021   HDL 45.70 12/18/2021   LDLCALC 67 12/18/2021   TRIG 167.0 (H) 12/18/2021   CHOLHDL 3 12/18/2021   Due for labs   Prediabetes Lab Results  Component Value Date   HGBA1C 6.2 11/05/2022   HGBA1C 6.3 12/18/2021   HGBA1C 6.4 04/02/2020   Due for labs    Patient Active Problem List   Diagnosis Date Noted   Class 2 obesity due to excess calories with body mass index (BMI) of 38.0 to 38.9 in adult 02/02/2024   Encounter for screening for HIV 02/02/2024   Encounter for hepatitis C screening test for low risk patient 02/02/2024   BPH  (benign prostatic hyperplasia) 02/02/2024   Productive cough 01/19/2024   Diabetes mellitus screening 01/19/2024   Nocturia 11/05/2022   Status post total left knee replacement 06/18/2022   Primary osteoarthritis of left knee 01/01/2022   Primary osteoarthritis of right knee 01/01/2022   Left knee pain 12/18/2021   Umbilical hernia 12/18/2021   Erectile dysfunction 12/31/2020   Insomnia 04/02/2020   Right ankle swelling 07/25/2019   Chronic pain of right ankle 08/31/2018   Routine general medical examination at a health care facility 11/01/2017   Prostate cancer screening 06/28/2017   Alopecia 11/04/2016   Back pain 05/06/2016   Sleep disorder 08/28/2015   Colon cancer screening 08/28/2015   Prediabetes 01/02/2013   Varicosities of leg 03/21/2012   Rectus diastasis of lower abdomen 03/21/2012   Obstructive sleep apnea 10/15/2009   Hyperlipidemia 02/02/2007   HEMATURIA, MICROSCOPIC 02/02/2007   Essential hypertension 02/01/2007   History of cardiovascular disorder 02/01/2007   Past Medical History:  Diagnosis Date   Contact dermatitis and other eczema due to plants (except food)    H/O: CVA (cardiovascular accident)    Carotid doppler neg (4/08)   Hematuria    Obesity, unspecified    Other and unspecified hyperlipidemia    Hx - no medication/diet controll and weight loss   Sacroiliitis, not elsewhere classified (HCC)    Sleep apnea    uses CPAP but not every night   Unspecified essential hypertension    Varicose veins of left lower extremity    Past Surgical History:  Procedure Laterality Date   Carotid dopplers  11/16/2006   Negative   HERNIA REPAIR     umbilical   TOTAL KNEE ARTHROPLASTY Left 06/18/2022   TOTAL KNEE ARTHROPLASTY Left 06/18/2022   Procedure: LEFT TOTAL KNEE ARTHROPLASTY;  Surgeon: Wes Hamman, MD;  Location: [facility] OR;  Service: Orthopedics;  Laterality: Left;   Social History   Tobacco Use   Smoking status: Never   Smokeless tobacco: Never   Vaping Use   Vaping status: Never Used  Substance Use Topics   Alcohol use: Yes    Alcohol/week: 4.0 standard drinks of alcohol    Types: 4 Cans of beer per week    Comment: weekends- iccasional   Drug use: No   Family History  Problem Relation Age of Onset   Hypertension Mother    Diabetes Mother    Esophageal cancer Mother    Prostate cancer  Paternal Uncle    Prostate cancer Paternal Uncle    Hyperlipidemia Other        in family    Cancer Neg Hx    Rectal cancer Neg Hx    Stomach cancer Neg Hx    Colon cancer Neg Hx    Colon polyps Neg Hx    Allergies  Allergen Reactions   Benazepril Hcl Cough   Current Outpatient Medications on File Prior to Visit  Medication Sig Dispense Refill   benzonatate  (TESSALON ) 200 MG capsule Take 1 capsule (200 mg total) by mouth 3 (three) times daily as needed for cough. Swallow whole 20 capsule 1   latanoprost (XALATAN) 0.005 % ophthalmic solution Place 1 drop into both eyes at bedtime.     meloxicam  (MOBIC ) 15 MG tablet TAKE 1 TABLET (15 MG TOTAL) BY MOUTH DAILY. 30 tablet 0   olmesartan -hydrochlorothiazide  (BENICAR  HCT) 40-25 MG tablet Take 1 tablet by mouth daily. 90 tablet 0   sildenafil  (VIAGRA ) 50 MG tablet TAKE 1 TABLET (50 MG TOTAL) BY MOUTH DAILY AS NEEDED FOR ERECTILE DYSFUNCTION. 30 MINUTES BEFORE SEXUAL ACTIVITY 10 tablet 3   tadalafil (CIALIS) 5 MG tablet Take 5 mg by mouth daily as needed.     tamsulosin (FLOMAX) 0.4 MG CAPS capsule Take 0.4 mg by mouth at bedtime.     timolol (TIMOPTIC) 0.5 % ophthalmic solution Place 1 drop into both eyes daily.     No current facility-administered medications on file prior to visit.    Review of Systems  Constitutional:  Negative for activity change, appetite change, fatigue, fever and unexpected weight change.  HENT:  Negative for congestion, rhinorrhea, sore throat and trouble swallowing.   Eyes:  Negative for pain, redness, itching and visual disturbance.  Respiratory:  Positive for  cough. Negative for chest tightness, shortness of breath and wheezing.        Cough is improved   Cardiovascular:  Negative for chest pain and palpitations.  Gastrointestinal:  Negative for abdominal pain, blood in stool, constipation, diarrhea and nausea.  Endocrine: Negative for cold intolerance, heat intolerance, polydipsia and polyuria.  Genitourinary:  Negative for difficulty urinating, dysuria, frequency and urgency.  Musculoskeletal:  Negative for arthralgias, joint swelling and myalgias.  Skin:  Negative for pallor and rash.  Neurological:  Negative for dizziness, tremors, weakness, numbness and headaches.  Hematological:  Negative for adenopathy. Does not bruise/bleed easily.  Psychiatric/Behavioral:  Negative for decreased concentration and dysphoric mood. The patient is not nervous/anxious.        Objective:   Physical Exam Constitutional:      General: He is not in acute distress.    Appearance: Normal appearance. He is well-developed. He is obese. He is not ill-appearing or diaphoretic.  HENT:     Head: Normocephalic and atraumatic.     Right Ear: Tympanic membrane, ear canal and external ear normal.     Left Ear: Tympanic membrane, ear canal and external ear normal.     Nose: Nose normal. No congestion.     Mouth/Throat:     Mouth: Mucous membranes are moist.     Pharynx: Oropharynx is clear. No posterior oropharyngeal erythema.   Eyes:     General: No scleral icterus.       Right eye: No discharge.        Left eye: No discharge.     Conjunctiva/sclera: Conjunctivae normal.     Pupils: Pupils are equal, round, and reactive to light.   Neck:  Thyroid : No thyromegaly.     Vascular: No carotid bruit or JVD.   Cardiovascular:     Rate and Rhythm: Normal rate and regular rhythm.     Pulses: Normal pulses.     Heart sounds: Normal heart sounds.     No gallop.  Pulmonary:     Effort: Pulmonary effort is normal. No respiratory distress.     Breath sounds: Normal  breath sounds. No wheezing or rales.     Comments: Good air exch Chest:     Chest wall: No tenderness.  Abdominal:     General: Bowel sounds are normal. There is no distension or abdominal bruit.     Palpations: Abdomen is soft. There is no mass.     Tenderness: There is no abdominal tenderness.     Hernia: A hernia is present.     Comments: Moderate size hernia above umbilicus  Reducible  Non tender    Musculoskeletal:        General: No tenderness.     Cervical back: Normal range of motion and neck supple. No rigidity. No muscular tenderness.     Right lower leg: No edema.     Left lower leg: No edema.  Lymphadenopathy:     Cervical: No cervical adenopathy.   Skin:    General: Skin is warm and dry.     Coloration: Skin is not pale.     Findings: No erythema or rash.     Comments: Few sks    Neurological:     Mental Status: He is alert.     Cranial Nerves: No cranial nerve deficit.     Motor: No abnormal muscle tone.     Coordination: Coordination normal.     Gait: Gait normal.     Deep Tendon Reflexes: Reflexes are normal and symmetric. Reflexes normal.   Psychiatric:        Mood and Affect: Mood normal.        Cognition and Memory: Cognition and memory normal.           Assessment & Plan:   Problem List Items Addressed This Visit       Cardiovascular and Mediastinum   Essential hypertension   bp in fair control at this time  BP Readings from Last 1 Encounters:  02/02/24 130/68   No changes needed Most recent labs reviewed  Disc lifstyle change with low sodium diet and exercise   Continue olmesartan  hct 40-25 mg daily         Genitourinary   BPH (benign prostatic hyperplasia)   Psa today  Improved with flomax For urology follow up soon        Other   Umbilical hernia   More bothersome Referral made to gen surg       Relevant Orders   Ambulatory referral to General Surgery   RESOLVED: Severe obesity (BMI 35.0-35.9 with comorbidity)  (HCC)   Routine general medical examination at a health care facility - Primary   Reviewed health habits including diet and exercise and skin cancer prevention Reviewed appropriate screening tests for age  Also reviewed health mt list, fam hx and immunization status , as well as social and family history   See HPI Labs reviewed and ordered Health Maintenance  Topic Date Due   HIV Screening  Never done   Yearly kidney health urinalysis for diabetes  Never done   Hepatitis C Screening  Never done   Pneumococcal Vaccination (2 of 2 - PCV)  05/08/2014   Complete foot exam   05/08/2014   Eye exam for diabetics  06/19/2014   Colon Cancer Screening  10/16/2020   Hemoglobin A1C  05/08/2023   Yearly kidney function blood test for diabetes  06/12/2023   Zoster (Shingles) Vaccine (1 of 2) 05/04/2025*   Flu Shot  03/17/2024   DTaP/Tdap/Td vaccine (3 - Td or Tdap) 11/05/2026   COVID-19 Vaccine  Completed   HPV Vaccine  Aged Out   Meningitis B Vaccine  Aged Out  *Topic was postponed. The date shown is not the original due date.    Hep c/ HIV screen today   low risk  Psa today  Gi referral done for colonoscopy -pt to schedule  Discussed fall prevention, supplements and exercise for bone density  PHQ 5=some grief/ declines need for counseling         Prostate cancer screening   Psa today  Has BPH  For urology follow up soon        Productive cough   Cxr clear  Improved  Reassuring exam      Prediabetes   A1c today disc imp of low glycemic diet and wt loss to prevent DM2       Hyperlipidemia   Lab today  Disc goals for lipids and reasons to control them Rev last labs with pt Rev low sat fat diet in detail Diet controlled       Encounter for screening for HIV   HIV screen today      Relevant Orders   HIV Antibody (routine testing w rflx)   Encounter for hepatitis C screening test for low risk patient   Hep C screen today      Relevant Orders   Hepatitis C  antibody   Diabetes mellitus screening   A1c today      Colon cancer screening   Overdue for 5 y recall colonoscopy  Reviiewed report and path results from colonoscopy 2017 with sessile polyps   Ref made to GI/Dr Dominic Friendly for colonoscopy  Pt has no fam hx  He will call for appt         Relevant Orders   Ambulatory referral to Gastroenterology   Class 2 obesity due to excess calories with body mass index (BMI) of 38.0 to 38.9 in adult   Discussed how this problem influences overall health and the risks it imposes  Reviewed plan for weight loss with lower calorie diet (via better food choices (lower glycemic and portion control) along with exercise building up to or more than 30 minutes 5 days per week including some aerobic activity and strength training   With prediabetes and HTN  Is avoiding bread and fried foods       Chronic pain of right ankle   Continues podiatry care MRI upcoming  Tendonitis

## 2024-02-02 NOTE — Assessment & Plan Note (Signed)
 Reviewed health habits including diet and exercise and skin cancer prevention Reviewed appropriate screening tests for age  Also reviewed health mt list, fam hx and immunization status , as well as social and family history   See HPI Labs reviewed and ordered Health Maintenance  Topic Date Due   HIV Screening  Never done   Yearly kidney health urinalysis for diabetes  Never done   Hepatitis C Screening  Never done   Pneumococcal Vaccination (2 of 2 - PCV) 05/08/2014   Complete foot exam   05/08/2014   Eye exam for diabetics  06/19/2014   Colon Cancer Screening  10/16/2020   Hemoglobin A1C  05/08/2023   Yearly kidney function blood test for diabetes  06/12/2023   Zoster (Shingles) Vaccine (1 of 2) 05/04/2025*   Flu Shot  03/17/2024   DTaP/Tdap/Td vaccine (3 - Td or Tdap) 11/05/2026   COVID-19 Vaccine  Completed   HPV Vaccine  Aged Out   Meningitis B Vaccine  Aged Out  *Topic was postponed. The date shown is not the original due date.    Hep c/ HIV screen today   low risk  Psa today  Gi referral done for colonoscopy -pt to schedule  Discussed fall prevention, supplements and exercise for bone density  PHQ 5=some grief/ declines need for counseling

## 2024-02-02 NOTE — Assessment & Plan Note (Signed)
Lab today Disc goals for lipids and reasons to control them Rev last labs with pt Rev low sat fat diet in detail Diet controlled

## 2024-02-02 NOTE — Assessment & Plan Note (Signed)
 A1c today  disc imp of low glycemic diet and wt loss to prevent DM2

## 2024-02-02 NOTE — Assessment & Plan Note (Signed)
Hep C screen today 

## 2024-02-02 NOTE — Assessment & Plan Note (Signed)
-

## 2024-02-02 NOTE — Assessment & Plan Note (Signed)
Overdue for 5 y recall colonoscopy  Reviiewed report and path results from colonoscopy 2017 with sessile polyps   Ref made to GI/Dr Loletha Carrow for colonoscopy  Pt has no fam hx  He will call for appt

## 2024-02-02 NOTE — Assessment & Plan Note (Addendum)
 Discussed how this problem influences overall health and the risks it imposes  Reviewed plan for weight loss with lower calorie diet (via better food choices (lower glycemic and portion control) along with exercise building up to or more than 30 minutes 5 days per week including some aerobic activity and strength training   With prediabetes and HTN  Is avoiding bread and fried foods

## 2024-02-02 NOTE — Patient Instructions (Addendum)
 Stay active  Get back to biking when you can Add some strength training to your routine, this is important for bone and brain health and can reduce your risk of falls and help your body use insulin properly and regulate weight  Light weights, exercise bands , and internet videos are a good way to start  Yoga (chair or regular), machines , floor exercises or a gym with machines are also good options     I put in referral for colonoscopy  Please call to schedule your colonoscopy  [facility]  [phone]   I put the referral in for general surgery for the hernia  Please let us  know if you don't hear in 1-2 weeks to set that up   Labs today   Take care of yourself

## 2024-02-02 NOTE — Assessment & Plan Note (Signed)
 bp in fair control at this time  BP Readings from Last 1 Encounters:  02/02/24 130/68   No changes needed Most recent labs reviewed  Disc lifstyle change with low sodium diet and exercise   Continue olmesartan  hct 40-25 mg daily

## 2024-02-02 NOTE — Assessment & Plan Note (Signed)
 Psa today  Improved with flomax For urology follow up soon

## 2024-02-02 NOTE — Assessment & Plan Note (Signed)
 Continues podiatry care MRI upcoming  Tendonitis

## 2024-02-02 NOTE — Assessment & Plan Note (Signed)
 Cxr clear  Improved  Reassuring exam

## 2024-02-03 LAB — CBC WITH DIFFERENTIAL/PLATELET
Basophils Absolute: 0.1 10*3/uL (ref 0.0–0.1)
Basophils Relative: 1.3 % (ref 0.0–3.0)
Eosinophils Absolute: 0.2 10*3/uL (ref 0.0–0.7)
Eosinophils Relative: 2.1 % (ref 0.0–5.0)
HCT: 40.4 % (ref 39.0–52.0)
Hemoglobin: 13.6 g/dL (ref 13.0–17.0)
Lymphocytes Relative: 30.4 % (ref 12.0–46.0)
Lymphs Abs: 3.4 10*3/uL (ref 0.7–4.0)
MCHC: 33.7 g/dL (ref 30.0–36.0)
MCV: 96.1 fl (ref 78.0–100.0)
Monocytes Absolute: 1 10*3/uL (ref 0.1–1.0)
Monocytes Relative: 9.1 % (ref 3.0–12.0)
Neutro Abs: 6.4 10*3/uL (ref 1.4–7.7)
Neutrophils Relative %: 57.1 % (ref 43.0–77.0)
Platelets: 301 10*3/uL (ref 150.0–400.0)
RBC: 4.2 Mil/uL — ABNORMAL LOW (ref 4.22–5.81)
RDW: 13 % (ref 11.5–15.5)
WBC: 11.2 10*3/uL — ABNORMAL HIGH (ref 4.0–10.5)

## 2024-02-03 LAB — COMPREHENSIVE METABOLIC PANEL WITH GFR
ALT: 22 U/L (ref 0–53)
AST: 20 U/L (ref 0–37)
Albumin: 3.8 g/dL (ref 3.5–5.2)
Alkaline Phosphatase: 47 U/L (ref 39–117)
BUN: 17 mg/dL (ref 6–23)
CO2: 29 meq/L (ref 19–32)
Calcium: 9.4 mg/dL (ref 8.4–10.5)
Chloride: 97 meq/L (ref 96–112)
Creatinine, Ser: 1.08 mg/dL (ref 0.40–1.50)
GFR: 73.21 mL/min (ref >60.00)
Glucose, Bld: 84 mg/dL (ref 70–99)
Potassium: 3.7 meq/L (ref 3.5–5.1)
Sodium: 135 meq/L (ref 135–145)
Total Bilirubin: 0.9 mg/dL (ref 0.2–1.2)
Total Protein: 6.9 g/dL (ref 6.0–8.3)

## 2024-02-03 LAB — LIPID PANEL
Cholesterol: 154 mg/dL (ref 0–200)
HDL: 51 mg/dL (ref >39.00)
LDL Cholesterol: 90 mg/dL (ref 0–99)
NonHDL: 103.24
Total CHOL/HDL Ratio: 3
Triglycerides: 64 mg/dL (ref 0.0–149.0)
VLDL: 12.8 mg/dL (ref 0.0–40.0)

## 2024-02-03 LAB — PSA: PSA: 0.91 ng/mL (ref 0.10–4.00)

## 2024-02-03 LAB — HEMOGLOBIN A1C: Hgb A1c MFr Bld: 7 % — ABNORMAL HIGH (ref 4.6–6.5)

## 2024-02-03 LAB — HEPATITIS C ANTIBODY: Hepatitis C Ab: NONREACTIVE

## 2024-02-03 LAB — HIV ANTIBODY (ROUTINE TESTING W REFLEX): HIV 1&2 Ab, 4th Generation: NONREACTIVE

## 2024-02-03 LAB — TSH: TSH: 0.73 u[IU]/mL (ref 0.35–5.50)

## 2024-02-04 ENCOUNTER — Encounter: Payer: Self-pay | Admitting: *Deleted

## 2024-02-09 ENCOUNTER — Ambulatory Visit
Admission: RE | Admit: 2024-02-09 | Discharge: 2024-02-09 | Discharge status: Home / Self Care | Source: Ambulatory Visit | Attending: Podiatry

## 2024-02-09 DIAGNOSIS — M76821 Posterior tibial tendinitis, right leg: Secondary | ICD-10-CM

## 2024-03-01 ENCOUNTER — Ambulatory Visit (INDEPENDENT_AMBULATORY_CARE_PROVIDER_SITE_OTHER): Admitting: Podiatry

## 2024-03-01 DIAGNOSIS — M19071 Primary osteoarthritis, right ankle and foot: Secondary | ICD-10-CM

## 2024-03-01 DIAGNOSIS — M19079 Primary osteoarthritis, unspecified ankle and foot: Secondary | ICD-10-CM | POA: Diagnosis not present

## 2024-03-01 DIAGNOSIS — M76821 Posterior tibial tendinitis, right leg: Secondary | ICD-10-CM | POA: Diagnosis not present

## 2024-03-01 NOTE — Progress Notes (Signed)
 Subjective:  Patient ID: Sean Lee, male    DOB: Aug 13, 1961,  MRN: [id number]  Chief Complaint  Patient presents with   Posterior tibial tendinitis of right leg    Pt stated that things are about the same he is here to go over MRI results     63 y.o. male presents with the above complaint.  Patient presents with complaint of right posterior tibial tendinitis he states is about the same has not gotten better he is here for next review and plan denies any other acute complaints.   Review of Systems: Negative except as noted in the HPI. Denies N/V/F/Ch.  Past Medical History:  Diagnosis Date   Contact dermatitis and other eczema due to plants (except food)    H/O: CVA (cardiovascular accident)    Carotid doppler neg (4/08)   Hematuria    Obesity, unspecified    Other and unspecified hyperlipidemia    Hx - no medication/diet controll and weight loss   Sacroiliitis, not elsewhere classified (HCC)    Sleep apnea    uses CPAP but not every night   Unspecified essential hypertension    Varicose veins of left lower extremity     Current Outpatient Medications:    latanoprost (XALATAN) 0.005 % ophthalmic solution, Place 1 drop into both eyes at bedtime., Disp: , Rfl:    meloxicam  (MOBIC ) 15 MG tablet, TAKE 1 TABLET (15 MG TOTAL) BY MOUTH DAILY., Disp: 30 tablet, Rfl: 0   metFORMIN  (GLUCOPHAGE -XR) 500 MG 24 hr tablet, Take 1 tablet (500 mg total) by mouth daily with breakfast., Disp: 90 tablet, Rfl: 1   olmesartan -hydrochlorothiazide  (BENICAR  HCT) 40-25 MG tablet, Take 1 tablet by mouth daily., Disp: 90 tablet, Rfl: 0   sildenafil  (VIAGRA ) 50 MG tablet, TAKE 1 TABLET (50 MG TOTAL) BY MOUTH DAILY AS NEEDED FOR ERECTILE DYSFUNCTION. 30 MINUTES BEFORE SEXUAL ACTIVITY, Disp: 10 tablet, Rfl: 3   tadalafil (CIALIS) 5 MG tablet, Take 5 mg by mouth daily as needed., Disp: , Rfl:    tamsulosin (FLOMAX) 0.4 MG CAPS capsule, Take 0.4 mg by mouth at bedtime., Disp: , Rfl:    timolol (TIMOPTIC)  0.5 % ophthalmic solution, Place 1 drop into both eyes daily., Disp: , Rfl:   Social History   Tobacco Use  Smoking Status Never  Smokeless Tobacco Never    Allergies  Allergen Reactions   Benazepril Hcl Cough   Objective:  There were no vitals filed for this visit. There is no height or weight on file to calculate BMI. Constitutional Well developed. Well nourished.  Vascular Dorsalis pedis pulses palpable bilaterally. Posterior tibial pulses palpable bilaterally. Capillary refill normal to all digits.  No cyanosis or clubbing noted. Pedal hair growth normal.  Neurologic Normal speech. Oriented to person, place, and time. Epicritic sensation to light touch grossly present bilaterally.  Dermatologic Nails well groomed and normal in appearance. No open wounds. No skin lesions.  Orthopedic: Pain on palpation to the right medial foot pain along the course of the posterior tibial tendon pain with resisted inversion and plantarflexion of the foot.  No pain with dorsiflexion eversion of the foot.   Radiographs: I reviewed her skeletally mature the right foot reviewed.  No fractures noted mild edema noted no other abnormalities noted Assessment:   1. Arthritis of right subtalar joint   2. Posterior tibial tendinitis of right leg   3. Osteoarthritis of talonavicular joint     Plan:  Patient was evaluated and treated and all questions  answered.  Right talonavicular joint arthritis with subtalar joint arthritis with posterior tibial tendon dysfunction - All questions and concerns were discussed with the patient in extensive detail at this time given the amount of arthritis that is present I believe patient would benefit from a CT scan to evaluate the extent of arthritis.  MRI was reviewed with the patient in extensive detail which shows the arthritic changes as well as some dysfunction of the posterior tibial tendon - CT scan order was placed   Pes planovalgus -I explained to  patient the etiology of pes planovalgus and relationship with Planter fasciitis and various treatment options were discussed.  Given patient foot structure in the setting of Planter fasciitis I believe patient will benefit from custom-made orthotics to help control the hindfoot motion support the arch of the foot and take the stress away from plantar fascial.  Patient agrees with the plan like to proceed with orthotics -Orthotics were dispensed and functioning well

## 2024-03-06 ENCOUNTER — Ambulatory Visit

## 2024-03-06 ENCOUNTER — Ambulatory Visit (INDEPENDENT_AMBULATORY_CARE_PROVIDER_SITE_OTHER): Admitting: Podiatry

## 2024-03-06 ENCOUNTER — Encounter: Payer: Self-pay | Admitting: Podiatry

## 2024-03-06 VITALS — Ht 70.0 in | Wt 271.2 lb

## 2024-03-06 DIAGNOSIS — M19071 Primary osteoarthritis, right ankle and foot: Secondary | ICD-10-CM | POA: Diagnosis not present

## 2024-03-06 DIAGNOSIS — M62461 Contracture of muscle, right lower leg: Secondary | ICD-10-CM | POA: Diagnosis not present

## 2024-03-07 NOTE — Progress Notes (Signed)
 Chief Complaint  Patient presents with   Ankle Pain    Pt is here due to right ankle pain, was seen here recently for the same issue, has had x-ray and MRI done on ankle, he is currently wearing cam boot to try and help with the pain, he states the pain is unbearable and sometimes can hardly walk on the ankle due to pain, pt is a long distance truck driver.    HPI: 63 y.o. male presenting today for follow-up evaluation of chronic severe pain and tenderness to the right lower extremity.  In the past he has been managed by Dr. Tobie here in our office.  Prior to establishing care with Dr. Tobie he has a history of longstanding chronic right foot and ankle pain extending over 10 years.  Idiopathic gradual onset that is progressively gotten worse over time.  He presents today in significant pain and tenderness wearing a cam boot.  Recently had an MRI completed and is scheduled for CT scan as well.  Past Medical History:  Diagnosis Date   Contact dermatitis and other eczema due to plants (except food)    H/O: CVA (cardiovascular accident)    Carotid doppler neg (4/08)   Hematuria    Obesity, unspecified    Other and unspecified hyperlipidemia    Hx - no medication/diet controll and weight loss   Sacroiliitis, not elsewhere classified (HCC)    Sleep apnea    uses CPAP but not every night   Unspecified essential hypertension    Varicose veins of left lower extremity     Past Surgical History:  Procedure Laterality Date   Carotid dopplers  11/16/2006   Negative   HERNIA REPAIR     umbilical   TOTAL KNEE ARTHROPLASTY Left 06/18/2022   TOTAL KNEE ARTHROPLASTY Left 06/18/2022   Procedure: LEFT TOTAL KNEE ARTHROPLASTY;  Surgeon: Jerri Kay HERO, MD;  Location: [facility] OR;  Service: Orthopedics;  Laterality: Left;    Allergies  Allergen Reactions   Benazepril Hcl Cough     Physical Exam: General: The patient is alert and oriented x3 in no acute distress.  Dermatology: Skin is warm, dry and  supple bilateral lower extremities.   Vascular: Palpable pedal pulses bilaterally. Capillary refill within normal limits.  No appreciable edema.  No erythema.  Neurological: Grossly intact via light touch  Musculoskeletal Exam: Pes planovalgus deformity noted with complete collapse of the medial longitudinal arch of the foot and significant pain on palpation and range of motion to the subtalar joint.  With the foot held in rectus alignment there is limited ankle joint dorsiflexion consistent with tight posterior complex and Achilles tendon equinus.  Radiographic Exam RT foot and ankle 03/06/2024:  Collapse of the medial longitudinal arch of the foot with medial deviation of the talar head.  Advanced severe arthritic change noted specifically to the talonavicular and subtalar joint.  MR ANKLE RIGHT WO CONTRAST 02/09/2024 IMPRESSION: Moderate subtalar and talonavicular osteoarthritis.Loss of sinus tarsi fat. Correlate for sinus tarsi syndrome. Pes planus. Mild-to-moderate tendinopathy and partial tearing of the distal posterior tibial tendon as well as a mild tenosynovitis. Chronic partial tear of a thickened anterior talofibular ligament. Intact fibers. Mild to moderate subcutaneous edema.  Assessment/Plan of Care: 1.  Chronic severe pes planovalgus deformity with end-stage arthritic changes right lower extremity 2.  Posterior tibial tendon dysfunction right lower extremity 3.  Osteoarthritis subtalar and talonavicular joint right 4.  Equinus deformity right  -Patient evaluated.  X-rays and MRI as  well as pertinent medical history reviewed -Unfortunately the patient has had severe pain and tenderness for over 1 year now.  He is very frustrated and wanting to have relief.  It affects his quality of life on a daily basis.  He is in chronic constant pain with ambulation secondary to the severe flatfoot and osteoarthritic change to the foot. -Apparently he is scheduled for possible PRP injection to  the foot.  Given the extensiveness of his osteoarthritis and foot structure I do not believe that PRP will be beneficial at all whatsoever for the patient.  Unfortunately he has failed multiple other conservative modalities for a long period of time including anti-inflammatory cortisone injections, oral anti-inflammatories, immobilization in a cam boot, shoe gear modifications, and stretching exercises with no relief.  I do believe that surgery is warranted at this time -Today we discussed triple arthrodesis surgery as well as tendo Achilles lengthening to the right lower extremity.  This will better align the rear foot and create a stable foot structure for the patient to ambulate potentially without pain.  The surgery was explained in detail to the patient including the risk benefits advantages and disadvantages of the procedure.  No guarantees were expressed or implied.  The postoperative recovery course was also explained.  After discussion with the patient he would like to pursue surgery at this time. -Authorization for surgery was initiated today.  Surgery will consist of triple arthrodesis right.  Tendo Achilles lengthening right. -Return to clinic 1 week postop  *Truckdriver unloading and making deliveries   Thresa EMERSON Sar, DPM [facility]  Dr. Thresa EMERSON Sar, DPM    [address] [place], [place] [address]                Office [phone]  Fax [phone]

## 2024-03-08 ENCOUNTER — Ambulatory Visit
Admission: RE | Admit: 2024-03-08 | Discharge: 2024-03-08 | Disposition: A | Discharge status: Home / Self Care | Source: Ambulatory Visit | Attending: Podiatry | Admitting: Podiatry

## 2024-03-08 ENCOUNTER — Encounter: Payer: Self-pay | Admitting: Family Medicine

## 2024-03-08 ENCOUNTER — Ambulatory Visit: Admitting: Family Medicine

## 2024-03-08 VITALS — BP 126/80 | HR 72 | Temp 98.4°F | Ht 70.0 in | Wt 271.1 lb

## 2024-03-08 DIAGNOSIS — E785 Hyperlipidemia, unspecified: Secondary | ICD-10-CM

## 2024-03-08 DIAGNOSIS — Z7984 Long term (current) use of oral hypoglycemic drugs: Secondary | ICD-10-CM

## 2024-03-08 DIAGNOSIS — M19071 Primary osteoarthritis, right ankle and foot: Secondary | ICD-10-CM

## 2024-03-08 DIAGNOSIS — Z6838 Body mass index (BMI) 38.0-38.9, adult: Secondary | ICD-10-CM

## 2024-03-08 DIAGNOSIS — E1169 Type 2 diabetes mellitus with other specified complication: Secondary | ICD-10-CM | POA: Diagnosis not present

## 2024-03-08 DIAGNOSIS — E119 Type 2 diabetes mellitus without complications: Secondary | ICD-10-CM

## 2024-03-08 DIAGNOSIS — E7800 Pure hypercholesterolemia, unspecified: Secondary | ICD-10-CM

## 2024-03-08 DIAGNOSIS — E66812 Obesity, class 2: Secondary | ICD-10-CM

## 2024-03-08 DIAGNOSIS — I10 Essential (primary) hypertension: Secondary | ICD-10-CM

## 2024-03-08 MED ORDER — METFORMIN HCL ER 500 MG PO TB24: 500.0000 mg | ORAL_TABLET | Freq: Every day | ORAL | 1 refills | Status: AC

## 2024-03-08 NOTE — Assessment & Plan Note (Signed)
 Lab Results  Component Value Date   HGBA1C 7.0 (H) 02/02/2024   HGBA1C 6.2 11/05/2022   HGBA1C 6.3 12/18/2021   Had steroids in last 3 mo  Obese  Fam history in mother   Discussed dx and potential complications Reviewed low glycemic diet and goals for exercise and weight loss  Discussed eye and foot care   Getting ready for potential ankle surgery soon  Normal foot exam   Cannot give sample for microalb today May consider dm teaching once his ankle surgery is done and also consider dm test equip  Will start metformin  xr 500 mg daily / handout given/ will call if side effects May try a chair exercise program   Will plan eye exam   Follow up 3 mo

## 2024-03-08 NOTE — Progress Notes (Signed)
 Subjective:    Patient ID: Sean Lee, male    DOB: March 24, 1961, 63 y.o.   MRN: [id number]  HPI  Wt Readings from Last 3 Encounters:  03/08/24 271 lb 2 oz (123 kg)  03/06/24 271 lb 4 oz (123 kg)  02/02/24 271 lb 4 oz (123 kg)   38.90 kg/m  Vitals:   03/08/24 0820 03/08/24 0849  BP: (!) 142/84 126/80  Pulse: 72   Temp: 98.4 F (36.9 C)   SpO2: 98%    Pt presents for follow up of chronic health problems including  DM2 HTN   Planning ankle reconstruction (severe pes planus) Is in a lot of pain   Also planning to fix his umbilical hernia   Is currently out of work and does not like it      HTN bp is stable today  No cp or palpitations or headaches or edema  No side effects to medicines  BP Readings from Last 3 Encounters:  03/08/24 126/80  02/02/24 130/68  01/19/24 138/84    Olmesartan  hct 40-25 mg daily  Lab Results  Component Value Date   NA 135 02/02/2024   K 3.7 02/02/2024   CO2 29 02/02/2024   GLUCOSE 84 02/02/2024   BUN 17 02/02/2024   CREATININE 1.08 02/02/2024   CALCIUM 9.4 02/02/2024   GFR 73.21 02/02/2024   GFRNONAA >60 06/11/2022     DM2-last A1c in the diabetic range  Diabetes Home sugar results   DM diet  Eating more protein with nuts (not salted)  Eats fruit in am    Exercise -limited / ankle     Lab Results  Component Value Date   HGBA1C 7.0 (H) 02/02/2024   HGBA1C 6.2 11/05/2022   HGBA1C 6.3 12/18/2021   No results found for: LABMICR, MICROALBUR  Was on steroids for the ankle -/ shot once  Renal protection Last eye exam    Lipids  Lab Results  Component Value Date   CHOL 154 02/02/2024   HDL 51.00 02/02/2024   LDLCALC 90 02/02/2024   TRIG 64.0 02/02/2024   CHOLHDL 3 02/02/2024      Patient Active Problem List   Diagnosis Date Noted   Class 2 obesity due to excess calories with body mass index (BMI) of 38.0 to 38.9 in adult 02/02/2024   Encounter for screening for HIV 02/02/2024   Encounter  for hepatitis C screening test for low risk patient 02/02/2024   BPH (benign prostatic hyperplasia) 02/02/2024   Productive cough 01/19/2024   Diabetes mellitus screening 01/19/2024   Nocturia 11/05/2022   Status post total left knee replacement 06/18/2022   Primary osteoarthritis of left knee 01/01/2022   Primary osteoarthritis of right knee 01/01/2022   Left knee pain 12/18/2021   Umbilical hernia 12/18/2021   Erectile dysfunction 12/31/2020   Insomnia 04/02/2020   Right ankle swelling 07/25/2019   Chronic pain of right ankle 08/31/2018   Routine general medical examination at a health care facility 11/01/2017   Prostate cancer screening 06/28/2017   DM2 (diabetes mellitus, type 2) (HCC) 11/05/2016   Alopecia 11/04/2016   Back pain 05/06/2016   Sleep disorder 08/28/2015   Colon cancer screening 08/28/2015   Prediabetes 01/02/2013   Varicosities of leg 03/21/2012   Rectus diastasis of lower abdomen 03/21/2012   Obstructive sleep apnea 10/15/2009   Hyperlipidemia associated with type 2 diabetes mellitus (HCC) 02/02/2007   HEMATURIA, MICROSCOPIC 02/02/2007   Essential hypertension 02/01/2007   History of cardiovascular disorder 02/01/2007  Past Medical History:  Diagnosis Date   Contact dermatitis and other eczema due to plants (except food)    H/O: CVA (cardiovascular accident)    Carotid doppler neg (4/08)   Hematuria    Obesity, unspecified    Other and unspecified hyperlipidemia    Hx - no medication/diet controll and weight loss   Sacroiliitis, not elsewhere classified (HCC)    Sleep apnea    uses CPAP but not every night   Unspecified essential hypertension    Varicose veins of left lower extremity    Past Surgical History:  Procedure Laterality Date   Carotid dopplers  11/16/2006   Negative   HERNIA REPAIR     umbilical   TOTAL KNEE ARTHROPLASTY Left 06/18/2022   TOTAL KNEE ARTHROPLASTY Left 06/18/2022   Procedure: LEFT TOTAL KNEE ARTHROPLASTY;  Surgeon: Jerri Kay HERO, MD;  Location: [facility] OR;  Service: Orthopedics;  Laterality: Left;   Social History   Tobacco Use   Smoking status: Never   Smokeless tobacco: Never  Vaping Use   Vaping status: Never Used  Substance Use Topics   Alcohol use: Yes    Alcohol/week: 4.0 standard drinks of alcohol    Types: 4 Cans of beer per week    Comment: weekends- iccasional   Drug use: No   Family History  Problem Relation Age of Onset   Hypertension Mother    Diabetes Mother    Esophageal cancer Mother    Prostate cancer Paternal Uncle    Prostate cancer Paternal Uncle    Hyperlipidemia Other        in family    Cancer Neg Hx    Rectal cancer Neg Hx    Stomach cancer Neg Hx    Colon cancer Neg Hx    Colon polyps Neg Hx    Allergies  Allergen Reactions   Benazepril Hcl Cough   Current Outpatient Medications on File Prior to Visit  Medication Sig Dispense Refill   latanoprost (XALATAN) 0.005 % ophthalmic solution Place 1 drop into both eyes at bedtime.     meloxicam  (MOBIC ) 15 MG tablet TAKE 1 TABLET (15 MG TOTAL) BY MOUTH DAILY. 30 tablet 0   olmesartan -hydrochlorothiazide  (BENICAR  HCT) 40-25 MG tablet Take 1 tablet by mouth daily. 90 tablet 0   sildenafil  (VIAGRA ) 50 MG tablet TAKE 1 TABLET (50 MG TOTAL) BY MOUTH DAILY AS NEEDED FOR ERECTILE DYSFUNCTION. 30 MINUTES BEFORE SEXUAL ACTIVITY 10 tablet 3   tadalafil (CIALIS) 5 MG tablet Take 5 mg by mouth daily as needed.     tamsulosin (FLOMAX) 0.4 MG CAPS capsule Take 0.4 mg by mouth at bedtime.     timolol (TIMOPTIC) 0.5 % ophthalmic solution Place 1 drop into both eyes daily.     No current facility-administered medications on file prior to visit.    Review of Systems  Constitutional:  Negative for activity change, appetite change, fatigue, fever and unexpected weight change.  HENT:  Negative for congestion, rhinorrhea, sore throat and trouble swallowing.   Eyes:  Negative for pain, redness, itching and visual disturbance.  Respiratory:   Negative for cough, chest tightness, shortness of breath and wheezing.   Cardiovascular:  Negative for chest pain and palpitations.  Gastrointestinal:  Negative for abdominal pain, blood in stool, constipation, diarrhea and nausea.       Umbilcal hernia   Endocrine: Negative for cold intolerance, heat intolerance, polydipsia and polyuria.  Genitourinary:  Negative for difficulty urinating, dysuria, frequency and urgency.  Musculoskeletal:  Positive for arthralgias. Negative for joint swelling and myalgias.       Severe pain in right ankle/foot  Limited mobility  Skin:  Negative for pallor and rash.  Neurological:  Negative for dizziness, tremors, weakness, numbness and headaches.  Hematological:  Negative for adenopathy. Does not bruise/bleed easily.  Psychiatric/Behavioral:  Negative for decreased concentration and dysphoric mood. The patient is not nervous/anxious.        Objective:   Physical Exam Constitutional:      General: He is not in acute distress.    Appearance: Normal appearance. He is well-developed. He is obese. He is not ill-appearing or diaphoretic.  HENT:     Head: Normocephalic and atraumatic.  Eyes:     General:        Right eye: No discharge.        Left eye: No discharge.     Conjunctiva/sclera: Conjunctivae normal.     Pupils: Pupils are equal, round, and reactive to light.  Neck:     Thyroid : No thyromegaly.     Vascular: No carotid bruit or JVD.  Cardiovascular:     Rate and Rhythm: Normal rate and regular rhythm.     Heart sounds: Normal heart sounds.     No gallop.  Pulmonary:     Effort: Pulmonary effort is normal. No respiratory distress.     Breath sounds: Normal breath sounds. No wheezing or rales.  Abdominal:     General: There is no distension or abdominal bruit.     Palpations: Abdomen is soft.     Comments: Baseline umbilical hernia  Musculoskeletal:     Cervical back: Normal range of motion and neck supple.     Right lower leg: No edema.      Left lower leg: No edema.  Lymphadenopathy:     Cervical: No cervical adenopathy.  Skin:    General: Skin is warm and dry.     Coloration: Skin is not pale.     Findings: No rash.  Neurological:     Mental Status: He is alert.     Sensory: No sensory deficit.     Coordination: Coordination normal.     Deep Tendon Reflexes: Reflexes are normal and symmetric. Reflexes normal.  Psychiatric:        Mood and Affect: Mood normal.           Assessment & Plan:   Problem List Items Addressed This Visit       Cardiovascular and Mediastinum   Essential hypertension   bp in fair control at this time  BP Readings from Last 1 Encounters:  03/08/24 126/80   No changes needed Most recent labs reviewed  Disc lifstyle change with low sodium diet and exercise   Continue olmesartan  hct 40-25 mg daily   Pt has dm2 now and arb will offer renal protection         Endocrine   Hyperlipidemia associated with type 2 diabetes mellitus (HCC)   Disc goals for lipids and reasons to control them Rev last labs with pt Rev low sat fat diet in detail LDL 90   Will discuss statin once he has ankle surgery and is recovering        Relevant Medications   metFORMIN  (GLUCOPHAGE -XR) 500 MG 24 hr tablet   DM2 (diabetes mellitus, type 2) (HCC) - Primary   Lab Results  Component Value Date   HGBA1C 7.0 (H) 02/02/2024   HGBA1C 6.2 11/05/2022   HGBA1C 6.3 12/18/2021  Had steroids in last 3 mo  Obese  Fam history in mother   Discussed dx and potential complications Reviewed low glycemic diet and goals for exercise and weight loss  Discussed eye and foot care   Getting ready for potential ankle surgery soon  Normal foot exam   Cannot give sample for microalb today May consider dm teaching once his ankle surgery is done and also consider dm test equip  Will start metformin  xr 500 mg daily / handout given/ will call if side effects May try a chair exercise program   Will plan eye  exam   Follow up 3 mo       Relevant Medications   metFORMIN  (GLUCOPHAGE -XR) 500 MG 24 hr tablet     Other   Class 2 obesity due to excess calories with body mass index (BMI) of 38.0 to 38.9 in adult   Discussed how this problem influences overall health and the risks it imposes  Reviewed plan for weight loss with lower calorie diet (via better food choices (lower glycemic and portion control) along with exercise building up to or more than 30 minutes 5 days per week including some aerobic activity and strength training   Now has DM2 Limited exercise with upcoming ankle surgery   Starting metformin   May consider glp-1 med in future if candidate       Relevant Medications   metFORMIN  (GLUCOPHAGE -XR) 500 MG 24 hr tablet

## 2024-03-08 NOTE — Assessment & Plan Note (Signed)
 Disc goals for lipids and reasons to control them Rev last labs with pt Rev low sat fat diet in detail LDL 90   Will discuss statin once he has ankle surgery and is recovering

## 2024-03-08 NOTE — Patient Instructions (Addendum)
 Avoid added sugars in diet  Try to get most of your carbohydrates from produce (with the exception of white potatoes) and whole grains Eat less bread/pasta/rice/snack foods/cereals/sweets and other items from the middle of the grocery store (processed carbs)   Look for video / strength training program from a chair   I would like to refer to some diabetic teaching classes eventually when you are recovering from surgery   Once you are post surgery- let us  know and I can put a referral   Take metformin  xr 500 mg once daily with first meal  If any intolerable side effects let me know    Follow up in 3 months   When you see the eye doctor -tell them you need a diabetic exam  We will get a urine protein test next time

## 2024-03-08 NOTE — Assessment & Plan Note (Signed)
 Discussed how this problem influences overall health and the risks it imposes  Reviewed plan for weight loss with lower calorie diet (via better food choices (lower glycemic and portion control) along with exercise building up to or more than 30 minutes 5 days per week including some aerobic activity and strength training   Now has DM2 Limited exercise with upcoming ankle surgery   Starting metformin   May consider glp-1 med in future if candidate

## 2024-03-08 NOTE — Assessment & Plan Note (Signed)
 bp in fair control at this time  BP Readings from Last 1 Encounters:  03/08/24 126/80   No changes needed Most recent labs reviewed  Disc lifstyle change with low sodium diet and exercise   Continue olmesartan  hct 40-25 mg daily   Pt has dm2 now and arb will offer renal protection

## 2024-03-14 ENCOUNTER — Encounter: Payer: Self-pay | Admitting: Gastroenterology

## 2024-03-14 ENCOUNTER — Telehealth: Payer: Self-pay | Admitting: Family Medicine

## 2024-03-14 NOTE — Telephone Encounter (Signed)
 Received ppwk form disability needing provider to complete form.  Placed in providers box at front desk

## 2024-03-14 NOTE — Telephone Encounter (Signed)
 Forms placed on Dr. Elsworth desk in in box

## 2024-03-14 NOTE — Telephone Encounter (Signed)
 This is a long and involved form  I am not the treating physician for the problem disabling him (I think ortho is)  Am I supposed to do this or them? If it is me , will need a visit  Thanks   I will hold form on my desk

## 2024-03-15 ENCOUNTER — Ambulatory Visit

## 2024-03-16 NOTE — Telephone Encounter (Signed)
 Pt said he does need PCP to fill that form out, his specialist also have a form they are filling out. Pt said PCP may not need to complete all the pages. Pt scheduled an appt on 03/20/24 to review/ fill out forms with PCP.   FYI to PCP

## 2024-03-16 NOTE — Telephone Encounter (Signed)
 Aware Will see him then Thanks

## 2024-03-17 DIAGNOSIS — Z0271 Encounter for disability determination: Secondary | ICD-10-CM

## 2024-03-17 NOTE — Telephone Encounter (Signed)
 s/w pt lst date of wrk ws 03/03/24. leave start 03/06/24-06/19/24 approx. I will email him copy of forms/notes faxed to [facility] [phone] and Debra [phone]

## 2024-03-17 NOTE — Telephone Encounter (Signed)
 SEE Prev

## 2024-03-17 NOTE — Telephone Encounter (Signed)
 noted

## 2024-03-20 ENCOUNTER — Encounter: Payer: Self-pay | Admitting: Family Medicine

## 2024-03-20 ENCOUNTER — Ambulatory Visit (INDEPENDENT_AMBULATORY_CARE_PROVIDER_SITE_OTHER): Admitting: Family Medicine

## 2024-03-20 ENCOUNTER — Telehealth: Payer: Self-pay | Admitting: Family Medicine

## 2024-03-20 VITALS — BP 128/86 | HR 79 | Temp 97.3°F | Ht 70.0 in | Wt 271.1 lb

## 2024-03-20 DIAGNOSIS — M19071 Primary osteoarthritis, right ankle and foot: Secondary | ICD-10-CM

## 2024-03-20 NOTE — Progress Notes (Unsigned)
 Subjective:    Patient ID: Sean Lee, male    DOB: 12-05-1960, 63 y.o.   MRN: [id number]  HPI  Wt Readings from Last 3 Encounters:  03/20/24 271 lb 2 oz (123 kg)  03/08/24 271 lb 2 oz (123 kg)  03/06/24 271 lb 4 oz (123 kg)   38.90 kg/m  Vitals:   03/20/24 1238  BP: 128/86  Pulse: 79  Temp: (!) 97.3 F (36.3 C)  SpO2: 98%    Pt presents for disability paperwork   Surgery is scheduled next week for the ankle   Assessment/Plan of Care: 1.  Chronic severe pes planovalgus deformity with end-stage arthritic changes right lower extremity 2.  Posterior tibial tendon dysfunction right lower extremity 3.  Osteoarthritis subtalar and talonavicular joint right 4.  Equinus deformity right  Ankle is very swollen and limited range of motion  Using cane/walker depending on pain   Patient Active Problem List   Diagnosis Date Noted   Class 2 obesity due to excess calories with body mass index (BMI) of 38.0 to 38.9 in adult 02/02/2024   Encounter for screening for HIV 02/02/2024   Encounter for hepatitis C screening test for low risk patient 02/02/2024   BPH (benign prostatic hyperplasia) 02/02/2024   Productive cough 01/19/2024   Diabetes mellitus screening 01/19/2024   Nocturia 11/05/2022   Status post total left knee replacement 06/18/2022   Primary osteoarthritis of left knee 01/01/2022   Primary osteoarthritis of right knee 01/01/2022   Left knee pain 12/18/2021   Umbilical hernia 12/18/2021   Erectile dysfunction 12/31/2020   Insomnia 04/02/2020   Arthritis of right ankle 07/25/2019   Chronic pain of right ankle 08/31/2018   Routine general medical examination at a health care facility 11/01/2017   Prostate cancer screening 06/28/2017   DM2 (diabetes mellitus, type 2) (HCC) 11/05/2016   Alopecia 11/04/2016   Back pain 05/06/2016   Sleep disorder 08/28/2015   Colon cancer screening 08/28/2015   Prediabetes 01/02/2013   Varicosities of leg 03/21/2012   Rectus  diastasis of lower abdomen 03/21/2012   Obstructive sleep apnea 10/15/2009   Hyperlipidemia associated with type 2 diabetes mellitus (HCC) 02/02/2007   HEMATURIA, MICROSCOPIC 02/02/2007   Essential hypertension 02/01/2007   History of cardiovascular disorder 02/01/2007   Past Medical History:  Diagnosis Date   Contact dermatitis and other eczema due to plants (except food)    H/O: CVA (cardiovascular accident)    Carotid doppler neg (4/08)   Hematuria    Obesity, unspecified    Other and unspecified hyperlipidemia    Hx - no medication/diet controll and weight loss   Sacroiliitis, not elsewhere classified (HCC)    Sleep apnea    uses CPAP but not every night   Unspecified essential hypertension    Varicose veins of left lower extremity    Past Surgical History:  Procedure Laterality Date   Carotid dopplers  11/16/2006   Negative   HERNIA REPAIR     umbilical   TOTAL KNEE ARTHROPLASTY Left 06/18/2022   TOTAL KNEE ARTHROPLASTY Left 06/18/2022   Procedure: LEFT TOTAL KNEE ARTHROPLASTY;  Surgeon: Jerri Kay HERO, MD;  Location: [facility] OR;  Service: Orthopedics;  Laterality: Left;   Social History   Tobacco Use   Smoking status: Never   Smokeless tobacco: Never  Vaping Use   Vaping status: Never Used  Substance Use Topics   Alcohol use: Yes    Alcohol/week: 4.0 standard drinks of alcohol    Types:  4 Cans of beer per week    Comment: weekends- iccasional   Drug use: No   Family History  Problem Relation Age of Onset   Hypertension Mother    Diabetes Mother    Esophageal cancer Mother    Prostate cancer Paternal Uncle    Prostate cancer Paternal Uncle    Hyperlipidemia Other        in family    Cancer Neg Hx    Rectal cancer Neg Hx    Stomach cancer Neg Hx    Colon cancer Neg Hx    Colon polyps Neg Hx    Allergies  Allergen Reactions   Benazepril Hcl Cough   Current Outpatient Medications on File Prior to Visit  Medication Sig Dispense Refill   latanoprost  (XALATAN) 0.005 % ophthalmic solution Place 1 drop into both eyes at bedtime.     meloxicam  (MOBIC ) 15 MG tablet TAKE 1 TABLET (15 MG TOTAL) BY MOUTH DAILY. 30 tablet 0   metFORMIN  (GLUCOPHAGE -XR) 500 MG 24 hr tablet Take 1 tablet (500 mg total) by mouth daily with breakfast. 90 tablet 1   olmesartan -hydrochlorothiazide  (BENICAR  HCT) 40-25 MG tablet Take 1 tablet by mouth daily. 90 tablet 0   sildenafil  (VIAGRA ) 50 MG tablet TAKE 1 TABLET (50 MG TOTAL) BY MOUTH DAILY AS NEEDED FOR ERECTILE DYSFUNCTION. 30 MINUTES BEFORE SEXUAL ACTIVITY 10 tablet 3   tadalafil (CIALIS) 5 MG tablet Take 5 mg by mouth daily as needed.     tamsulosin (FLOMAX) 0.4 MG CAPS capsule Take 0.4 mg by mouth at bedtime.     timolol (TIMOPTIC) 0.5 % ophthalmic solution Place 1 drop into both eyes daily.     No current facility-administered medications on file prior to visit.    Review of Systems  Constitutional:        Decrease in appetite with metformin    Musculoskeletal:  Positive for arthralgias.       Mod to severe right ankle and foot pain with swelling Unable to bear full weight        Objective:   Physical Exam Constitutional:      General: He is not in acute distress.    Appearance: Normal appearance. He is obese. He is not ill-appearing.  Cardiovascular:     Rate and Rhythm: Normal rate and regular rhythm.  Pulmonary:     Effort: Pulmonary effort is normal. No respiratory distress.  Musculoskeletal:     Comments: Right ankle-markedly swollen Limited rom of ankle and foot  Unable to bear full weight on ankle No redness or open skin  Skin:    Findings: No erythema or rash.  Neurological:     Mental Status: He is alert.     Sensory: No sensory deficit.  Psychiatric:        Mood and Affect: Mood normal.           Assessment & Plan:   Problem List Items Addressed This Visit       Musculoskeletal and Integument   Arthritis of right ankle - Primary   End stage /severe (subtalar and  talonavicular)  In setting of chronic severe pes planovalgus  Causing tibial tendon dysfunction  Also equinus deformity right  Unable to work at all Unable to walk without cane or walker  Planning surgery soon with dr Thresa Sar  Disability paperwork done today

## 2024-03-20 NOTE — Assessment & Plan Note (Signed)
 End stage /severe (subtalar and talonavicular)  In setting of chronic severe pes planovalgus  Causing tibial tendon dysfunction  Also equinus deformity right  Unable to work at all Unable to walk without cane or walker  Planning surgery soon with dr Thresa Sar  Disability paperwork done today

## 2024-03-20 NOTE — Patient Instructions (Signed)
 We will get paperwork done for your ankle problem /disability and fax it

## 2024-03-20 NOTE — Telephone Encounter (Signed)
 This mutual patient (had knee replacement in 2023) has dental work coming up and his dentist reached out to see if he needs any antibiotic prophylaxis   I'm unaware of any specific risk factors  Wanted to run it by you   Thanks!

## 2024-03-21 ENCOUNTER — Other Ambulatory Visit: Payer: Self-pay | Admitting: Surgery

## 2024-03-21 DIAGNOSIS — Z0271 Encounter for disability determination: Secondary | ICD-10-CM

## 2024-03-21 NOTE — Telephone Encounter (Signed)
 Hi Marne, thanks for reaching out.  Technically I do it for 2 years postop but he's almost there so it's probably fine not to do it anymore.  I can reach out to him.    Vertell, do you mind letting him know that dental prophylaxis is no longer needed. Thanks.

## 2024-03-21 NOTE — Telephone Encounter (Signed)
Dental form faxed.

## 2024-03-21 NOTE — Telephone Encounter (Signed)
 Recd forms from [employer]. Faxed [phone] form and notes for pt

## 2024-03-21 NOTE — Telephone Encounter (Signed)
 Sent msg Via Mychart but also spoke to patient. Patient aware.

## 2024-03-21 NOTE — Telephone Encounter (Signed)
 Dental form put in IN box

## 2024-03-22 ENCOUNTER — Telehealth: Payer: Self-pay | Admitting: Podiatry

## 2024-03-22 DIAGNOSIS — Z0271 Encounter for disability determination: Secondary | ICD-10-CM

## 2024-03-22 NOTE — Telephone Encounter (Signed)
 DOS- 03/30/2024  TRIPLE ARTHRODESIS RT- 71284 TENDO-ACHILLES LENGTH RT- 72314  [facility] EFFECTIVE DATE- 08/18/2023  DEDUCTIBLE- $750 REMAINING- $0 FAMILY DEDUCTIBLE- $2250 REMAINING- $750 OOP- $5400 REMAINING- $2425.17 FAMILY OOP- $10800 REMAINING- $7055.54  PER [employer] WEBSITE, NO PRIOR AUTHORIZATIONS ARE REQUIRED FOR CPT CODES 71284 AND [id number]. DECISION ID# [id number]

## 2024-03-28 ENCOUNTER — Encounter: Admitting: Gastroenterology

## 2024-03-30 ENCOUNTER — Other Ambulatory Visit: Payer: Self-pay | Admitting: Podiatry

## 2024-03-30 ENCOUNTER — Telehealth: Payer: Self-pay | Admitting: Podiatry

## 2024-03-30 DIAGNOSIS — M62461 Contracture of muscle, right lower leg: Secondary | ICD-10-CM | POA: Diagnosis not present

## 2024-03-30 DIAGNOSIS — Z0271 Encounter for disability determination: Secondary | ICD-10-CM

## 2024-03-30 DIAGNOSIS — M19071 Primary osteoarthritis, right ankle and foot: Secondary | ICD-10-CM | POA: Diagnosis not present

## 2024-03-30 MED ORDER — OXYCODONE-ACETAMINOPHEN 10-325 MG PO TABS: 1.0000 | ORAL_TABLET | ORAL | 0 refills | Status: DC | PRN

## 2024-03-30 MED ORDER — CEPHALEXIN 500 MG PO CAPS: 500.0000 mg | ORAL_CAPSULE | Freq: Four times a day (QID) | ORAL | 0 refills | Status: DC

## 2024-03-30 MED ORDER — IBUPROFEN 800 MG PO TABS: 800.0000 mg | ORAL_TABLET | Freq: Three times a day (TID) | ORAL | 1 refills | Status: AC

## 2024-03-30 NOTE — Telephone Encounter (Signed)
 Pt left [employer] and [employer] form to complete. I faxed CD [phone] and faxed [employer] [phone]. DOS 03/30/24 and approx RTW 06/19/24. Pt is a [employer] architect.  I will let pt know 03/31/24 he can pick up his copy or I can email him- He left all in folder at front desk

## 2024-03-30 NOTE — Telephone Encounter (Signed)
 See prev notes

## 2024-03-30 NOTE — Progress Notes (Signed)
 PRN postop

## 2024-03-30 NOTE — Telephone Encounter (Signed)
 Patient is calling regarding knee scooter, patient would like for provider to contact him. [phone]

## 2024-03-31 DIAGNOSIS — Z0271 Encounter for disability determination: Secondary | ICD-10-CM

## 2024-04-04 NOTE — Telephone Encounter (Signed)
 cld pt and adv he can pick up original copies of his forms at his 04/05/24 visit.

## 2024-04-05 ENCOUNTER — Ambulatory Visit (INDEPENDENT_AMBULATORY_CARE_PROVIDER_SITE_OTHER)

## 2024-04-05 ENCOUNTER — Ambulatory Visit (INDEPENDENT_AMBULATORY_CARE_PROVIDER_SITE_OTHER): Admitting: Podiatry

## 2024-04-05 ENCOUNTER — Encounter: Payer: Self-pay | Admitting: Podiatry

## 2024-04-05 VITALS — Ht 70.0 in | Wt 271.1 lb

## 2024-04-05 DIAGNOSIS — M19071 Primary osteoarthritis, right ankle and foot: Secondary | ICD-10-CM

## 2024-04-05 DIAGNOSIS — M76821 Posterior tibial tendinitis, right leg: Secondary | ICD-10-CM

## 2024-04-05 HISTORY — PX: FOOT SURGERY: SHX648

## 2024-04-05 MED ORDER — OXYCODONE-ACETAMINOPHEN 10-325 MG PO TABS
1.0000 | ORAL_TABLET | Freq: Four times a day (QID) | ORAL | 0 refills | Status: DC | PRN
Start: 2024-04-05 — End: 2024-05-04

## 2024-04-05 NOTE — Progress Notes (Signed)
   Chief Complaint  Patient presents with   Routine Post Op    POV # 1 DOS 03/30/24 RT FOOT TRIPLE ARTHRODESIS, RT FOOT TENDO ACHILLES LENGTHENING, pt is here to f/u on right foot after surgery states some pain and soreness to the foot, concern about drain states he thinks something is clog because its not draining like it was before.    Subjective:  Patient presents today status post triple arthrodesis with tendo Achilles lengthening right.  DOS: 03/30/2024.  Doing well.  Pain is managed with his Percocet prescribed.  No new complaints  Past Medical History:  Diagnosis Date   Contact dermatitis and other eczema due to plants (except food)    H/O: CVA (cardiovascular accident)    Carotid doppler neg (4/08)   Hematuria    Obesity, unspecified    Other and unspecified hyperlipidemia    Hx - no medication/diet controll and weight loss   Sacroiliitis, not elsewhere classified (HCC)    Sleep apnea    uses CPAP but not every night   Unspecified essential hypertension    Varicose veins of left lower extremity     Past Surgical History:  Procedure Laterality Date   Carotid dopplers  11/16/2006   Negative   HERNIA REPAIR     umbilical   TOTAL KNEE ARTHROPLASTY Left 06/18/2022   TOTAL KNEE ARTHROPLASTY Left 06/18/2022   Procedure: LEFT TOTAL KNEE ARTHROPLASTY;  Surgeon: Jerri Kay HERO, MD;  Location: [facility] OR;  Service: Orthopedics;  Laterality: Left;    Allergies  Allergen Reactions   Benazepril Hcl Cough    Objective/Physical Exam Neurovascular status intact.  Incision well coapted with staples intact. No sign of infectious process noted. No dehiscence. No active bleeding noted. edema noted to the surgical extremity.  Overall the foot is in good rectus alignment with the leg  Radiographic Exam RT foot and ankle 04/05/2024:  Orthopedic hardware and arthrodesis sites appear to be stable with routine healing.  Good restoration of the arch of the foot noted on lateral  view  Assessment: 1. s/p triple arthrodesis with tendo Achilles lengthening of right. DOS: 03/30/2024   Plan of Care:  -Patient was evaluated. X-rays reviewed -JP drain removed - Dressings changed.  Leave clean dry and intact -Continue strict NWB CAM boot using the knee scooter -Refill prescription for Percocet 10/325 mg every 6 hours as needed pain -Return to clinic 1 week  Thresa EMERSON Sar, DPM [facility]  Dr. Thresa EMERSON Sar, DPM    [address] [place], [place] [address]                Office [phone]  Fax [phone]

## 2024-04-12 ENCOUNTER — Ambulatory Visit (INDEPENDENT_AMBULATORY_CARE_PROVIDER_SITE_OTHER): Payer: Self-pay | Admitting: Podiatry

## 2024-04-12 ENCOUNTER — Encounter: Admitting: Podiatry

## 2024-04-12 ENCOUNTER — Other Ambulatory Visit

## 2024-04-12 VITALS — BP 135/75 | HR 84 | Temp 98.6°F | Resp 18

## 2024-04-12 DIAGNOSIS — M19071 Primary osteoarthritis, right ankle and foot: Secondary | ICD-10-CM

## 2024-04-12 NOTE — Progress Notes (Signed)
   Chief Complaint  Patient presents with   Post-op Follow-up    DOS 03/30/24. RT foot. Wearing cam boot and using knee scooter. Reports some ongoing pain. 5/10 pain, intermittent, sharp. Oral codeine  does help at night. Dressing was moist upon removal. Some old blood on xeroform. Swelling is present, there is whitish colored skin under his toes as well. He is still mostly non WB, no PT as of right now. No fever/body aches/chills/nausea/vomiting    Subjective:  Patient presents today status post triple arthrodesis with tendo Achilles lengthening right.  DOS: 03/30/2024.  Doing well.  Pain is managed with his Percocet prescribed.  No new complaints  Past Medical History:  Diagnosis Date   Contact dermatitis and other eczema due to plants (except food)    H/O: CVA (cardiovascular accident)    Carotid doppler neg (4/08)   Hematuria    Obesity, unspecified    Other and unspecified hyperlipidemia    Hx - no medication/diet controll and weight loss   Sacroiliitis, not elsewhere classified (HCC)    Sleep apnea    uses CPAP but not every night   Unspecified essential hypertension    Varicose veins of left lower extremity     Past Surgical History:  Procedure Laterality Date   Carotid dopplers  11/16/2006   Negative   HERNIA REPAIR     umbilical   TOTAL KNEE ARTHROPLASTY Left 06/18/2022   TOTAL KNEE ARTHROPLASTY Left 06/18/2022   Procedure: LEFT TOTAL KNEE ARTHROPLASTY;  Surgeon: Jerri Kay HERO, MD;  Location: [facility] OR;  Service: Orthopedics;  Laterality: Left;    Allergies  Allergen Reactions   Benazepril Hcl Cough    Objective/Physical Exam Neurovascular status intact.  Incision well coapted with staples intact. No sign of infectious process noted. No dehiscence. No active bleeding noted. edema noted to the surgical extremity.  Overall the foot is in good rectus alignment with the leg  Radiographic Exam RT foot and ankle 04/05/2024:  Orthopedic hardware and arthrodesis sites appear to  be stable with routine healing.  Good restoration of the arch of the foot noted on lateral view  Assessment: 1. s/p triple arthrodesis with tendo Achilles lengthening of right. DOS: 03/30/2024   Plan of Care:  -Patient was evaluated.  - Dressings changed.  Leave clean dry and intact -Continue strict NWB CAM boot using the knee scooter - Continue Percocet 10/325 mg every 6 hours as needed pain -Return to clinic 1 week for staple removal  Thresa EMERSON Sar, DPM [facility]  Dr. Thresa EMERSON Sar, DPM    [address] [place], [place] [address]                Office [phone]  Fax [phone]

## 2024-04-15 ENCOUNTER — Other Ambulatory Visit: Payer: Self-pay | Admitting: Family Medicine

## 2024-04-26 ENCOUNTER — Ambulatory Visit (INDEPENDENT_AMBULATORY_CARE_PROVIDER_SITE_OTHER): Admitting: Podiatry

## 2024-04-26 ENCOUNTER — Encounter: Payer: Self-pay | Admitting: Podiatry

## 2024-04-26 ENCOUNTER — Ambulatory Visit (INDEPENDENT_AMBULATORY_CARE_PROVIDER_SITE_OTHER)

## 2024-04-26 ENCOUNTER — Ambulatory Visit

## 2024-04-26 DIAGNOSIS — M19071 Primary osteoarthritis, right ankle and foot: Secondary | ICD-10-CM

## 2024-04-26 NOTE — Progress Notes (Signed)
   Chief Complaint  Patient presents with   Routine Post Op    POV # 3 DOS 03/30/24 RT FOOT TRIPLE ARTHRODESIS, RT FOOT TENDO ACHILLES LENGTHENING, pt is here to f/u on right foot/ankle she states right now his pain level is 5, winging himself off the knee scooter, continues to wear cam boot and cane.    Subjective:  Patient presents today status post triple arthrodesis with tendo Achilles lengthening right.  DOS: 03/30/2024.  Patient states that he began walking and putting pressure on the foot in the cam boot with the assistance of a cane although this was never recommended.  He had clear instructions to be strictly nonweightbearing to the extremity postoperatively.  He has noticed some increased swelling to the foot due to the ambulation.  Pain is very tolerable and he is feeling well  Past Medical History:  Diagnosis Date   Contact dermatitis and other eczema due to plants (except food)    H/O: CVA (cardiovascular accident)    Carotid doppler neg (4/08)   Hematuria    Obesity, unspecified    Other and unspecified hyperlipidemia    Hx - no medication/diet controll and weight loss   Sacroiliitis, not elsewhere classified (HCC)    Sleep apnea    uses CPAP but not every night   Unspecified essential hypertension    Varicose veins of left lower extremity     Past Surgical History:  Procedure Laterality Date   Carotid dopplers  11/16/2006   Negative   HERNIA REPAIR     umbilical   TOTAL KNEE ARTHROPLASTY Left 06/18/2022   TOTAL KNEE ARTHROPLASTY Left 06/18/2022   Procedure: LEFT TOTAL KNEE ARTHROPLASTY;  Surgeon: Jerri Kay HERO, MD;  Location: [facility] OR;  Service: Orthopedics;  Laterality: Left;    Allergies  Allergen Reactions   Benazepril Hcl Cough    Objective/Physical Exam Neurovascular status intact.  Incision well coapted with staples intact. No sign of infectious process noted. No dehiscence. No active bleeding noted. edema noted to the surgical extremity.   Radiographic  Exam RT foot and ankle 04/26/2024:  Stable.  Mostly unchanged.  Orthopedic hardware and arthrodesis sites appear to be stable with routine healing.  Good restoration of the arch of the foot noted on lateral view  Assessment: 1. s/p triple arthrodesis with tendo Achilles lengthening of right. DOS: 03/30/2024   Plan of Care:  -Patient was evaluated.  X-rays reviewed - Staples and sutures removed -Okay to wash and shower and get the foot wet -Reinforce and explained that he needs to be strictly nonweightbearing for an additional 4 weeks.  Return to the knee scooter or wheelchair.  No pressure on the foot in the cam boot -Return to clinic 5 weeks follow-up x-ray  Thresa EMERSON Sar, DPM [facility]  Dr. Thresa EMERSON Sar, DPM    [address] [place], [place] [address]                Office [phone]  Fax [phone]

## 2024-05-02 ENCOUNTER — Encounter (HOSPITAL_COMMUNITY): Payer: Self-pay | Admitting: Surgery

## 2024-05-03 ENCOUNTER — Encounter (HOSPITAL_COMMUNITY): Payer: Self-pay | Admitting: Surgery

## 2024-05-03 ENCOUNTER — Other Ambulatory Visit: Payer: Self-pay

## 2024-05-03 NOTE — H&P (Signed)
 PROVIDER: VICENTA DASIE POLI, MD  MRN: [id number] DOB: Nov 26, 1960  Subjective   Chief Complaint: Umbilical Hernia (DOT is wanting him to have the hernia removed. )   History of Present Illness: Sean Lee is a 63 y.o. male who is seen today for term follow-up regarding his hernia. I saw him in February of last year for the ventral hernia above the umbilicus. We were originally scheduling surgery but then because of a death in the family he decided to hold. Since that time he has been having worsening arthritis in his right foot and is scheduled next week for surgery on the foot. He will be out of work until least November so would like to go ahead and proceed with a hernia repair in the interim as well. He reports the hernia is only slightly gotten larger and causes no discomfort. He has had no obstructive symptoms. He is otherwise doing well..    Review of Systems: A complete review of systems was obtained from the patient. I have reviewed this information and discussed as appropriate with the patient. See HPI as well for other ROS.  ROS   Medical History: Past Medical History:  Diagnosis Date  Hypertension  Sleep apnea   There is no problem list on file for this patient.  Past Surgical History:  Procedure Laterality Date  left knee replacement 06/18/2022    Allergies  Allergen Reactions  Benazepril Hcl Cough and Other (See Comments)  REACTION: cough   Current Outpatient Medications on File Prior to Visit  Medication Sig Dispense Refill  colchicine  (COLCRYS ) 0.6 mg tablet Take 0.6 mg by mouth 2 (two) times daily as needed  latanoprost (XALATAN) 0.005 % ophthalmic solution Place 1 drop into both eyes at bedtime  meloxicam  (MOBIC ) 15 MG tablet Take 15 mg by mouth once daily  metFORMIN  (GLUCOPHAGE -XR) 500 MG XR tablet Take 500 mg by mouth daily with breakfast  olmesartan -hydroCHLOROthiazide  (BENICAR  HCT) 40-25 mg tablet Take 1 tablet by mouth once daily  sildenafiL   (VIAGRA ) 50 MG tablet Take 50 mg by mouth  tamsulosin (FLOMAX) 0.4 mg capsule Take 0.4 mg by mouth at bedtime   No current facility-administered medications on file prior to visit.   Family History  Problem Relation Age of Onset  Hyperlipidemia (Elevated cholesterol) Mother  Diabetes Mother    Social History   Tobacco Use  Smoking Status Never  Smokeless Tobacco Never    Social History   Socioeconomic History  Marital status: Married  Tobacco Use  Smoking status: Never  Smokeless tobacco: Never  Substance and Sexual Activity  Alcohol use: Yes  Drug use: Never  Sexual activity: Defer   Social Drivers of Health   Financial Resource Strain: Low Risk (03/04/2024)  Received from [facility]  Overall Financial Resource Strain (CARDIA)  How hard is it for you to pay for the very basics like food, housing, medical care, and heating?: Not hard at all  Food Insecurity: No Food Insecurity (03/04/2024)  Received from [facility]  Hunger Vital Sign  Within the past 12 months, you worried that your food would run out before you got the money to buy more.: Never true  Within the past 12 months, the food you bought just didn't last and you didn't have money to get more.: Never true  Transportation Needs: No Transportation Needs (03/04/2024)  Received from [facility]  In the past 12 months, has lack of transportation kept you from medical appointments or from getting  medications?: No  In the past 12 months, has lack of transportation kept you from meetings, work, or from getting things needed for daily living?: No  Physical Activity: Insufficiently Active (03/04/2024)  Received from [facility]  Exercise Vital Sign  On average, how many days per week do you engage in moderate to strenuous exercise (like a brisk walk)?: 3 days  On average, how many minutes do you engage in exercise at this level?: 30 min  Stress: Stress Concern Present (03/04/2024)  Received  from [facility] - Occupational Stress Questionnaire  Do you feel stress - tense, restless, nervous, or anxious, or unable to sleep at night because your mind is troubled all the time - these days?: To some extent  Social Connections: Moderately Integrated (03/04/2024)  Received from [facility]  Social Connection and Isolation Panel  In a typical week, how many times do you talk on the phone with family, friends, or neighbors?: More than three times a week  How often do you get together with friends or relatives?: Once a week  How often do you attend church or religious services?: More than 4 times per year  Do you belong to any clubs or organizations such as church groups, unions, fraternal or athletic groups, or school groups?: No  Are you married, widowed, divorced, separated, never married, or living with a partner?: Married  Housing Stability: Unknown (03/21/2024)  Housing Stability Vital Sign  Homeless in the Last Year: No   Objective:   Vitals:   BP: 130/83  Pulse: 90  Temp: 36.7 C (98 F)  TempSrc: Temporal  SpO2: 99%  Weight: (!) 123 kg (271 lb 3.2 oz)  Height: 188 cm (6' 2)  PainSc: 0-No pain   Body mass index is 34.82 kg/m.  Physical Exam   He appears well on exam  His abdomen is soft and obese.  He has a chronically incarcerated, nontender hernia above the umbilicus. It is difficult to feel the fascial defect but it may be 4 cm.  Labs, Imaging and Diagnostic Testing:  I have reviewed the notes in the electronic medical records  Assessment and Plan:   Diagnoses and all orders for this visit:  Ventral hernia without obstruction or gangrene    We will now go ahead and proceed with an open ventral hernia repair with mesh. I again discussed the diagnosis with him in detail. We will wait until September to allow him time to recover from the foot surgery and this will again allow him time to recover from the hernia  surgery prior to return to work in early November. We again discussed the risks of the surgery which includes but is not limited to bleeding, infection, injury to surrounding structures, use of mesh, hernia recurrence, cardiopulmonary issues with anesthesia, postoperative recovery, etc. Again he understands and agrees to proceed with surgery which will be scheduled for September

## 2024-05-03 NOTE — Progress Notes (Signed)
 SDW CALL  Patient was given pre-op instructions over the phone. The opportunity was given for the patient to ask questions. No further questions asked. Patient verbalized understanding of instructions given.   PCP - Tower, Laine LABOR, MD  Cardiologist -   PPM/ICD - denies Device Orders - n/a Rep Notified - n/a  Chest x-ray - 01-19-24 EKG - DOS Stress Test -  ECHO - 2012 Cardiac Cath -    CPAP - uses nightly  Fasting Blood Sugar - per patient he is pre diabetic MD monitors A1c. Last A1c on  02-02-24 7.0 metFORMIN  (GLUCOPHAGE -XR) Last dose 05-03-24  Blood Thinner Instructions:denies Aspirin  Instructions:denies  ERAS Protcol -clear liquids until 8:30 am.   COVID TEST- n/a  Anesthesia review: yes hx of htn, dm,osa  Patient denies shortness of breath, fever, cough and chest pain over the phone call   All instructions explained to the patient, with a verbal understanding of the material. Patient agrees to go over the instructions while at home for a better understanding.

## 2024-05-04 ENCOUNTER — Ambulatory Visit (HOSPITAL_COMMUNITY): Admitting: Anesthesiology

## 2024-05-04 ENCOUNTER — Ambulatory Visit (HOSPITAL_COMMUNITY)
Admission: RE | Admit: 2024-05-04 | Discharge: 2024-05-04 | Disposition: A | Discharge status: Home / Self Care | Attending: Surgery | Admitting: Surgery

## 2024-05-04 ENCOUNTER — Encounter (HOSPITAL_COMMUNITY)
Admission: RE | Disposition: A | Discharge status: Home / Self Care | Payer: Self-pay | Source: Home / Self Care | Attending: Surgery

## 2024-05-04 ENCOUNTER — Other Ambulatory Visit: Payer: Self-pay

## 2024-05-04 ENCOUNTER — Encounter (HOSPITAL_COMMUNITY): Payer: Self-pay | Admitting: Surgery

## 2024-05-04 DIAGNOSIS — M1990 Unspecified osteoarthritis, unspecified site: Secondary | ICD-10-CM | POA: Insufficient documentation

## 2024-05-04 DIAGNOSIS — I639 Cerebral infarction, unspecified: Secondary | ICD-10-CM

## 2024-05-04 DIAGNOSIS — Z79899 Other long term (current) drug therapy: Secondary | ICD-10-CM | POA: Diagnosis not present

## 2024-05-04 DIAGNOSIS — I10 Essential (primary) hypertension: Secondary | ICD-10-CM | POA: Diagnosis not present

## 2024-05-04 DIAGNOSIS — G4730 Sleep apnea, unspecified: Secondary | ICD-10-CM | POA: Diagnosis not present

## 2024-05-04 DIAGNOSIS — K436 Other and unspecified ventral hernia with obstruction, without gangrene: Secondary | ICD-10-CM | POA: Diagnosis not present

## 2024-05-04 DIAGNOSIS — Z7984 Long term (current) use of oral hypoglycemic drugs: Secondary | ICD-10-CM | POA: Insufficient documentation

## 2024-05-04 DIAGNOSIS — G4733 Obstructive sleep apnea (adult) (pediatric): Secondary | ICD-10-CM

## 2024-05-04 DIAGNOSIS — E669 Obesity, unspecified: Secondary | ICD-10-CM | POA: Diagnosis not present

## 2024-05-04 DIAGNOSIS — Z833 Family history of diabetes mellitus: Secondary | ICD-10-CM | POA: Diagnosis not present

## 2024-05-04 DIAGNOSIS — Z791 Long term (current) use of non-steroidal anti-inflammatories (NSAID): Secondary | ICD-10-CM | POA: Insufficient documentation

## 2024-05-04 DIAGNOSIS — M19071 Primary osteoarthritis, right ankle and foot: Secondary | ICD-10-CM | POA: Diagnosis not present

## 2024-05-04 DIAGNOSIS — Z6833 Body mass index (BMI) 33.0-33.9, adult: Secondary | ICD-10-CM | POA: Diagnosis not present

## 2024-05-04 DIAGNOSIS — K439 Ventral hernia without obstruction or gangrene: Secondary | ICD-10-CM | POA: Diagnosis present

## 2024-05-04 DIAGNOSIS — E119 Type 2 diabetes mellitus without complications: Secondary | ICD-10-CM | POA: Insufficient documentation

## 2024-05-04 HISTORY — PX: VENTRAL HERNIA REPAIR: SHX424

## 2024-05-04 HISTORY — DX: Type 2 diabetes mellitus without complications: E11.9

## 2024-05-04 LAB — POCT I-STAT, CHEM 8
BUN: 23 mg/dL (ref 8–23)
Calcium, Ion: 1.19 mmol/L (ref 1.15–1.40)
Chloride: 103 mmol/L (ref 98–111)
Creatinine, Ser: 1.4 mg/dL — ABNORMAL HIGH (ref 0.61–1.24)
Glucose, Bld: 100 mg/dL — ABNORMAL HIGH (ref 70–99)
HCT: 40 % (ref 39.0–52.0)
Hemoglobin: 13.6 g/dL (ref 13.0–17.0)
Potassium: 3.8 mmol/L (ref 3.5–5.1)
Sodium: 138 mmol/L (ref 135–145)
TCO2: 20 mmol/L — ABNORMAL LOW (ref 22–32)

## 2024-05-04 LAB — GLUCOSE, CAPILLARY
Glucose-Capillary: 105 mg/dL — ABNORMAL HIGH (ref 70–99)
Glucose-Capillary: 106 mg/dL — ABNORMAL HIGH (ref 70–99)

## 2024-05-04 SURGERY — REPAIR, HERNIA, VENTRAL
Anesthesia: General | Site: Abdomen

## 2024-05-04 MED ORDER — OXYCODONE HCL 5 MG/5ML PO SOLN: 5.0000 mg | Freq: Once | ORAL | Status: DC | PRN

## 2024-05-04 MED ORDER — CHLORHEXIDINE GLUCONATE 0.12 % MT SOLN
OROMUCOSAL | Status: AC
  Administered 2024-05-04: 15 mL via OROMUCOSAL
  Filled 2024-05-04: qty 15

## 2024-05-04 MED ORDER — ROCURONIUM BROMIDE 10 MG/ML (PF) SYRINGE
PREFILLED_SYRINGE | INTRAVENOUS | Status: DC | PRN
  Administered 2024-05-04: 50 mg via INTRAVENOUS

## 2024-05-04 MED ORDER — MIDAZOLAM HCL 2 MG/2ML IJ SOLN
INTRAMUSCULAR | Status: DC | PRN
  Administered 2024-05-04: 2 mg via INTRAVENOUS

## 2024-05-04 MED ORDER — CEFAZOLIN SODIUM-DEXTROSE 3-4 GM/150ML-% IV SOLN
3.0000 g | INTRAVENOUS | Status: AC
Start: 2024-05-04 — End: 2024-05-04
  Administered 2024-05-04: 3 g via INTRAVENOUS
  Filled 2024-05-04: qty 150

## 2024-05-04 MED ORDER — LACTATED RINGERS IV SOLN: INTRAVENOUS | Status: DC

## 2024-05-04 MED ORDER — PROPOFOL 10 MG/ML IV BOLUS
INTRAVENOUS | Status: AC
  Filled 2024-05-04: qty 20

## 2024-05-04 MED ORDER — MIDAZOLAM HCL 2 MG/2ML IJ SOLN
INTRAMUSCULAR | Status: AC
  Filled 2024-05-04: qty 2

## 2024-05-04 MED ORDER — CHLORHEXIDINE GLUCONATE CLOTH 2 % EX PADS: 6.0000 | MEDICATED_PAD | Freq: Once | CUTANEOUS | Status: DC

## 2024-05-04 MED ORDER — OXYCODONE HCL 5 MG PO TABS: 5.0000 mg | ORAL_TABLET | Freq: Four times a day (QID) | ORAL | 0 refills | Status: AC | PRN

## 2024-05-04 MED ORDER — FENTANYL CITRATE (PF) 100 MCG/2ML IJ SOLN
25.0000 ug | INTRAMUSCULAR | Status: DC | PRN
  Administered 2024-05-04 (×2): 25 ug via INTRAVENOUS

## 2024-05-04 MED ORDER — BUPIVACAINE-EPINEPHRINE (PF) 0.25% -1:200000 IJ SOLN
INTRAMUSCULAR | Status: DC | PRN
  Administered 2024-05-04: 20 mL

## 2024-05-04 MED ORDER — SUGAMMADEX SODIUM 200 MG/2ML IV SOLN
INTRAVENOUS | Status: DC | PRN
  Administered 2024-05-04: 300 mg via INTRAVENOUS

## 2024-05-04 MED ORDER — DEXAMETHASONE SODIUM PHOSPHATE 10 MG/ML IJ SOLN
INTRAMUSCULAR | Status: DC | PRN
Start: 2024-05-04 — End: 2024-05-04
  Administered 2024-05-04: 5 mg via INTRAVENOUS

## 2024-05-04 MED ORDER — PHENYLEPHRINE 80 MCG/ML (10ML) SYRINGE FOR IV PUSH (FOR BLOOD PRESSURE SUPPORT)
PREFILLED_SYRINGE | INTRAVENOUS | Status: DC | PRN
  Administered 2024-05-04: 80 ug via INTRAVENOUS
  Administered 2024-05-04: 160 ug via INTRAVENOUS

## 2024-05-04 MED ORDER — FENTANYL CITRATE (PF) 250 MCG/5ML IJ SOLN
INTRAMUSCULAR | Status: AC
  Filled 2024-05-04: qty 5

## 2024-05-04 MED ORDER — ACETAMINOPHEN 500 MG PO TABS: 1000.0000 mg | ORAL_TABLET | ORAL | Status: AC

## 2024-05-04 MED ORDER — 0.9 % SODIUM CHLORIDE (POUR BTL) OPTIME
TOPICAL | Status: DC | PRN
  Administered 2024-05-04: 1000 mL

## 2024-05-04 MED ORDER — ONDANSETRON HCL 4 MG/2ML IJ SOLN
INTRAMUSCULAR | Status: DC | PRN
  Administered 2024-05-04: 4 mg via INTRAVENOUS

## 2024-05-04 MED ORDER — SODIUM CHLORIDE 0.9 % IV SOLN: 12.5000 mg | INTRAVENOUS | Status: DC | PRN

## 2024-05-04 MED ORDER — ACETAMINOPHEN 500 MG PO TABS
1000.0000 mg | ORAL_TABLET | Freq: Once | ORAL | Status: AC
  Administered 2024-05-04: 1000 mg via ORAL
  Filled 2024-05-04: qty 2

## 2024-05-04 MED ORDER — PROPOFOL 10 MG/ML IV BOLUS
INTRAVENOUS | Status: DC | PRN
  Administered 2024-05-04: 50 mg via INTRAVENOUS
  Administered 2024-05-04: 150 mg via INTRAVENOUS

## 2024-05-04 MED ORDER — INSULIN ASPART 100 UNIT/ML IJ SOLN: 0.0000 [IU] | INTRAMUSCULAR | Status: DC | PRN

## 2024-05-04 MED ORDER — ENSURE PRE-SURGERY PO LIQD: 296.0000 mL | Freq: Once | ORAL | Status: DC

## 2024-05-04 MED ORDER — AMISULPRIDE (ANTIEMETIC) 5 MG/2ML IV SOLN: 10.0000 mg | Freq: Once | INTRAVENOUS | Status: DC | PRN

## 2024-05-04 MED ORDER — ORAL CARE MOUTH RINSE: 15.0000 mL | Freq: Once | OROMUCOSAL | Status: AC

## 2024-05-04 MED ORDER — LIDOCAINE 2% (20 MG/ML) 5 ML SYRINGE
INTRAMUSCULAR | Status: DC | PRN
  Administered 2024-05-04: 60 mg via INTRAVENOUS

## 2024-05-04 MED ORDER — FENTANYL CITRATE (PF) 100 MCG/2ML IJ SOLN
INTRAMUSCULAR | Status: AC
  Filled 2024-05-04: qty 2

## 2024-05-04 MED ORDER — OXYCODONE HCL 5 MG PO TABS: 5.0000 mg | ORAL_TABLET | Freq: Once | ORAL | Status: DC | PRN

## 2024-05-04 MED ORDER — CELECOXIB 200 MG PO CAPS
200.0000 mg | ORAL_CAPSULE | Freq: Once | ORAL | Status: AC
  Administered 2024-05-04: 200 mg via ORAL
  Filled 2024-05-04: qty 1

## 2024-05-04 MED ORDER — CHLORHEXIDINE GLUCONATE 0.12 % MT SOLN: 15.0000 mL | Freq: Once | OROMUCOSAL | Status: AC

## 2024-05-04 MED ORDER — FENTANYL CITRATE (PF) 250 MCG/5ML IJ SOLN
INTRAMUSCULAR | Status: DC | PRN
  Administered 2024-05-04 (×3): 50 ug via INTRAVENOUS

## 2024-05-04 SURGICAL SUPPLY — 33 items
BAG COUNTER SPONGE SURGICOUNT (BAG) ×2 IMPLANT
BLADE CLIPPER SURG (BLADE) IMPLANT
CANISTER SUCTION 3000ML PPV (SUCTIONS) ×2 IMPLANT
CHLORAPREP W/TINT 26 (MISCELLANEOUS) ×2 IMPLANT
COVER SURGICAL LIGHT HANDLE (MISCELLANEOUS) ×2 IMPLANT
DERMABOND ADVANCED .7 DNX12 (GAUZE/BANDAGES/DRESSINGS) IMPLANT
DRAPE LAPAROSCOPIC ABDOMINAL (DRAPES) ×2 IMPLANT
DRSG TEGADERM 4X4.75 (GAUZE/BANDAGES/DRESSINGS) ×2 IMPLANT
DRSG TELFA 3X8 NADH STRL (GAUZE/BANDAGES/DRESSINGS) IMPLANT
ELECTRODE REM PT RTRN 9FT ADLT (ELECTROSURGICAL) ×2 IMPLANT
GAUZE SPONGE 4X4 12PLY STRL (GAUZE/BANDAGES/DRESSINGS) IMPLANT
GLOVE SURG SIGNA 7.5 PF LTX (GLOVE) ×2 IMPLANT
GOWN STRL REUS W/ TWL LRG LVL3 (GOWN DISPOSABLE) ×4 IMPLANT
GOWN STRL REUS W/ TWL XL LVL3 (GOWN DISPOSABLE) ×2 IMPLANT
KIT BASIN OR (CUSTOM PROCEDURE TRAY) ×2 IMPLANT
KIT TURNOVER KIT B (KITS) ×2 IMPLANT
MESH VENTRALEX ST 1-7/10 CRC S (Mesh General) IMPLANT
NDL HYPO 25GX1X1/2 BEV (NEEDLE) ×2 IMPLANT
NEEDLE HYPO 25GX1X1/2 BEV (NEEDLE) ×1 IMPLANT
NS IRRIG 1000ML POUR BTL (IV SOLUTION) ×2 IMPLANT
PACK GENERAL/GYN (CUSTOM PROCEDURE TRAY) ×2 IMPLANT
PAD ARMBOARD POSITIONER FOAM (MISCELLANEOUS) ×2 IMPLANT
PENCIL SMOKE EVACUATOR (MISCELLANEOUS) ×2 IMPLANT
STAPLER SKIN PROX 35W (STAPLE) IMPLANT
SUT MNCRL AB 4-0 PS2 18 (SUTURE) ×2 IMPLANT
SUT NOVA NAB DX-16 0-1 5-0 T12 (SUTURE) IMPLANT
SUT PDS AB 1 TP1 96 (SUTURE) ×2 IMPLANT
SUT SILK 3 0 TIES 10X30 (SUTURE) IMPLANT
SUT VIC AB 3-0 SH 27XBRD (SUTURE) ×2 IMPLANT
SYR CONTROL 10ML LL (SYRINGE) ×2 IMPLANT
TOWEL GREEN STERILE (TOWEL DISPOSABLE) ×2 IMPLANT
TOWEL GREEN STERILE FF (TOWEL DISPOSABLE) ×2 IMPLANT
TRAY FOLEY MTR SLVR 14FR STAT (SET/KITS/TRAYS/PACK) IMPLANT

## 2024-05-04 NOTE — Interval H&P Note (Signed)
 History and Physical Interval Note: NO CHANGE IN H AND P  05/04/2024 9:55 AM  Sean Lee  has presented today for surgery, with the diagnosis of VENTRAL HERNIA.  The various methods of treatment have been discussed with the patient and family. After consideration of risks, benefits and other options for treatment, the patient has consented to  Procedure(s): REPAIR, HERNIA, VENTRAL (N/A) as a surgical intervention.  The patient's history has been reviewed, patient examined, no change in status, stable for surgery.  I have reviewed the patient's chart and labs.  Questions were answered to the patient's satisfaction.     Vicenta Poli

## 2024-05-04 NOTE — Anesthesia Preprocedure Evaluation (Addendum)
 Anesthesia Evaluation  Patient identified by MRN, date of birth, ID band Patient awake    Reviewed: Allergy & Precautions, NPO status , Patient's Chart, lab work & pertinent test results  History of Anesthesia Complications Negative for: history of anesthetic complications  Airway Mallampati: I  TM Distance: >3 FB Neck ROM: Full    Dental  (+) Dental Advisory Given, Teeth Intact   Pulmonary sleep apnea and Continuous Positive Airway Pressure Ventilation    Pulmonary exam normal        Cardiovascular hypertension, Pt. on medications Normal cardiovascular exam     Neuro/Psych  Neuromuscular disease CVA, No Residual Symptoms  negative psych ROS   GI/Hepatic negative GI ROS, Neg liver ROS,,,  Endo/Other  diabetes, Type 2, Oral Hypoglycemic Agents   Obesity   Renal/GU negative Renal ROS     Musculoskeletal  (+) Arthritis ,    Abdominal   Peds  Hematology negative hematology ROS (+)   Anesthesia Other Findings   Reproductive/Obstetrics                              Anesthesia Physical Anesthesia Plan  ASA: 3  Anesthesia Plan: General   Post-op Pain Management: Tylenol  PO (pre-op)* and Celebrex  PO (pre-op)*   Induction: Intravenous  PONV Risk Score and Plan: 2 and Treatment may vary due to age or medical condition, Ondansetron , Dexamethasone  and Midazolam   Airway Management Planned: Oral ETT  Additional Equipment: None  Intra-op Plan:   Post-operative Plan: Extubation in OR  Informed Consent: I have reviewed the patients History and Physical, chart, labs and discussed the procedure including the risks, benefits and alternatives for the proposed anesthesia with the patient or authorized representative who has indicated his/her understanding and acceptance.     Dental advisory given  Plan Discussed with: CRNA and Anesthesiologist  Anesthesia Plan Comments:           Anesthesia Quick Evaluation

## 2024-05-04 NOTE — Op Note (Signed)
   Sean Lee 05/04/2024   Pre-op Diagnosis: VENTRAL HERNIA     Post-op Diagnosis: INCARCERATED VENTRAL HERNIA (1.5 CM FASCIAL DEFECT)  Procedure(s): OPEN REPAIR OF INCARCERATED VENTRAL HERNIA WITH MESH (4.3 CM VENTRAL PROLENE PATCH)  Surgeon(s): Vernetta Berg, MD  Anesthesia: General  Staff:  Circulator: Bari Birmingham, RN Scrub Person: Starla Duwaine BROCKS, RN  Estimated Blood Loss: Minimal               Findings: The patient was found to have a ventral hernia several inches above the umbilicus containing incarcerated omentum.  Once the omentum was excised and the rest was reduced back into the abdominal cavity, the fascial defect was only 1.5 cm in size.  It was repaired with a 4.3 cm round ventral Prolene patch from [facility]  Procedure: The patient was brought to the operating room and identified the correct patient.  He was placed upon the operating table general anesthesia was induced.  His abdomen was prepped and draped in usual sterile fashion.  I made a vertical incision over the palpable hernia above the umbilicus with a scalpel.  I carried this down to the hernia sac which was easily identified with the cautery.  I dissected out the the large sac circumferentially.  I took it all the way down to the fascia and found a very small fascial defect.  I opened up the sac and it contained only omentum.  I transected the base of the omentum just above the fascia with the cautery as well as a 2-0 silk tie and reduce the rest of the omentum back into the abdominal cavity.  The fascial defect measured 1.5 cm with a ruler.  Through the opening I could palpate his large piece of mesh that was placed below the umbilicus for previous hernia.  A 4.3 cm round ventral Prolene patch from [facility] was brought onto the field.  I placed it through the fascial opening and then pulled it up against the peritoneum with the ties.  I then sutured the mesh in place circumferentially with #1 Novafil sutures.  The  ties were then cut and I closed the fascia over the top of the mesh with 2 separate figure-of-eight #1 Novafil sutures.  Hemostasis appeared to be achieved.  I anesthetized the fascia circumferentially with Marcaine .  I also anesthetized the surrounding subcutaneous tissue and skin with Marcaine  as well.  The subcutaneous tissue was then closed with interrupted 3-0 Vicryl sutures and the skin was closed with running 4-0 Monocryl.  Dermabond was then applied.  The patient tolerated the procedure well.  All the counts were correct at the end of the procedure.  The patient was then extubated in the operating room and taken in stable condition to the recovery room.          Berg Vernetta   Date: 05/04/2024  Time: 11:06 AM

## 2024-05-04 NOTE — Transfer of Care (Signed)
 Immediate Anesthesia Transfer of Care Note  Patient: Sean Lee  Procedure(s) Performed: REPAIR, HERNIA, VENTRAL WITH MESH (Abdomen)  Patient Location: PACU  Anesthesia Type:General  Level of Consciousness: awake and drowsy  Airway & Oxygen Therapy: Patient Spontanous Breathing and Patient connected to face mask oxygen  Post-op Assessment: Report given to RN and Post -op Vital signs reviewed and stable  Post vital signs: Reviewed and stable  Last Vitals:  Vitals Value Taken Time  BP 130/76 05/04/24 11:20  Temp    Pulse 95 05/04/24 11:22  Resp 18 05/04/24 11:22  SpO2 97 % 05/04/24 11:22  Vitals shown include unfiled device data.  Last Pain:  Vitals:   05/04/24 0910  TempSrc:   PainSc: 0-No pain         Complications: No notable events documented.

## 2024-05-04 NOTE — Anesthesia Postprocedure Evaluation (Signed)
 Anesthesia Post Note  Patient: Sean Lee  Procedure(s) Performed: REPAIR, HERNIA, VENTRAL WITH MESH (Abdomen)     Patient location during evaluation: PACU Anesthesia Type: General Level of consciousness: awake and alert Pain management: pain level controlled Vital Signs Assessment: post-procedure vital signs reviewed and stable Respiratory status: spontaneous breathing, nonlabored ventilation and respiratory function stable Cardiovascular status: stable and blood pressure returned to baseline Anesthetic complications: no   No notable events documented.  Last Vitals:  Vitals:   05/04/24 1200 05/04/24 1215  BP: 122/73 132/76  Pulse: 76 76  Resp: 11 12  Temp:  36.4 C  SpO2: 93% 93%    Last Pain:  Vitals:   05/04/24 1215  TempSrc:   PainSc: 3                  Debby FORBES Like

## 2024-05-04 NOTE — Discharge Instructions (Signed)
 [facility]__[facility]  UMBILICAL OR INGUINAL HERNIA REPAIR: POST OP INSTRUCTIONS  Always review your discharge instruction sheet given to you by the facility where your surgery was performed. IF YOU HAVE DISABILITY OR FAMILY LEAVE FORMS, YOU MUST BRING THEM TO THE OFFICE FOR PROCESSING.   DO NOT GIVE THEM TO YOUR DOCTOR.  1. A  prescription for pain medication may be given to you upon discharge.  Take your pain medication as prescribed, if needed.  If narcotic pain medicine is not needed, then you may take acetaminophen (Tylenol) or ibuprofen (Advil) as needed. 2. Take your usually prescribed medications unless otherwise directed. If you need a refill on your pain medication, please contact your pharmacy.  They will contact our office to request authorization. Prescriptions will not be filled after 5 pm or on week-ends. 3. You should follow a light diet the first 24 hours after arrival home, such as soup and crackers, etc.  Be sure to include lots of fluids daily.  Resume your normal diet the day after surgery. 4.Most patients will experience some swelling and bruising around the umbilicus or in the groin and scrotum.  Ice packs and reclining will help.  Swelling and bruising can take several days to resolve.  6. It is common to experience some constipation if taking pain medication after surgery.  Increasing fluid intake and taking a stool softener (such as Colace) will usually help or prevent this problem from occurring.  A mild laxative (Milk of Magnesia or Miralax) should be taken according to package directions if there are no bowel movements after 48 hours. 7. Unless discharge instructions indicate otherwise, you may remove your bandages 24-48 hours after surgery, and you may shower at that time.  You may have steri-strips (small skin tapes) in place directly over the incision.  These strips should be left on the skin for 7-10 days.  If your surgeon used skin glue on the  incision, you may shower in 24 hours.  The glue will flake off over the next 2-3 weeks.  Any sutures or staples will be removed at the office during your follow-up visit. 8. ACTIVITIES:  You may resume regular (light) daily activities beginning the next day--such as daily self-care, walking, climbing stairs--gradually increasing activities as tolerated.  You may have sexual intercourse when it is comfortable.  Refrain from any heavy lifting or straining until approved by your doctor.  a.You may drive when you are no longer taking prescription pain medication, you can comfortably wear a seatbelt, and you can safely maneuver your car and apply brakes. b.RETURN TO WORK:   _____________________________________________  9.You should see your doctor in the office for a follow-up appointment approximately 2-3 weeks after your surgery.  Make sure that you call for this appointment within a day or two after you arrive home to insure a convenient appointment time. 10.OTHER INSTRUCTIONS: YOU MAY SHOWER STARTING TOMORROW ICE PACK, TYLENOL, AND IBUPROFEN ALSO FOR PAIN NO LIFTING MORE THAN 15 POUNDS FOR 6 WEEKS_________________________    _____________________________________  WHEN TO CALL YOUR DOCTOR: Fever over 101.0 Inability to urinate Nausea and/or vomiting Extreme swelling or bruising Continued bleeding from incision. Increased pain, redness, or drainage from the incision  The clinic staff is available to answer your questions during regular business hours.  Please don't hesitate to call and ask to speak to one of the nurses for clinical concerns.  If you have a medical emergency, go to the nearest emergency room or call 911.  A surgeon from [facility] is always on call at the hospital   [address], [place], [place]  [address] ?  [address] [address], [place], [place]   [address] [phone] ? [phone] ? FAX [phone] Web site: [email or web address]

## 2024-05-04 NOTE — Anesthesia Procedure Notes (Signed)
 Procedure Name: Intubation Date/Time: 05/04/2024 10:34 AM  Performed by: Boyce Shilling, CRNAPre-anesthesia Checklist: Patient identified, Emergency Drugs available, Suction available, Timeout performed and Patient being monitored Patient Re-evaluated:Patient Re-evaluated prior to induction Oxygen Delivery Method: Circle system utilized Preoxygenation: Pre-oxygenation with 100% oxygen Induction Type: IV induction Ventilation: Mask ventilation without difficulty and Oral airway inserted - appropriate to patient size Laryngoscope Size: Mac and 4 Grade View: Grade I Tube type: Oral Tube size: 7.5 mm Number of attempts: 1 Airway Equipment and Method: Stylet Placement Confirmation: ETT inserted through vocal cords under direct vision, positive ETCO2, CO2 detector and breath sounds checked- equal and bilateral Secured at: 22 cm Tube secured with: Tape Dental Injury: Teeth and Oropharynx as per pre-operative assessment

## 2024-05-05 ENCOUNTER — Encounter (HOSPITAL_COMMUNITY): Payer: Self-pay | Admitting: Surgery

## 2024-05-10 ENCOUNTER — Other Ambulatory Visit: Payer: Self-pay | Admitting: Medical Genetics

## 2024-05-12 ENCOUNTER — Telehealth: Payer: Self-pay

## 2024-05-12 NOTE — Telephone Encounter (Signed)
 LVM to schedule orthotic fitting/ pu

## 2024-05-16 ENCOUNTER — Ambulatory Visit (AMBULATORY_SURGERY_CENTER)

## 2024-05-16 ENCOUNTER — Encounter: Payer: Self-pay | Admitting: Gastroenterology

## 2024-05-16 VITALS — Ht 74.0 in | Wt 250.0 lb

## 2024-05-16 DIAGNOSIS — Z860100 Personal history of colon polyps, unspecified: Secondary | ICD-10-CM

## 2024-05-16 MED ORDER — NA SULFATE-K SULFATE-MG SULF 17.5-3.13-1.6 GM/177ML PO SOLN: 1.0000 | Freq: Once | ORAL | 0 refills | Status: AC

## 2024-05-16 NOTE — Progress Notes (Signed)
 No egg or soy allergy known to patient  No issues known to pt with past sedation with any surgeries or procedures Patient denies ever being told they had issues or difficulty with intubation  No FH of Malignant Hyperthermia Pt is not on diet pills Pt is not on  home 02  Pt is not on blood thinners  Pt denies issues with constipation  No A fib or A flutter Have any cardiac testing pending--NO  Pt can ambulate-Walker/cane- foot surgery in August- boot and cane    Pt denies use of chewing tobacco Discussed diabetic I weight loss medication holds Discussed NSAID holds Checked BMI Pt instructed to use [employer] or GoodRx for a price reduction on prep  Patient's chart reviewed by Norleen Schillings CNRA prior to previsit and patient appropriate for the [facility].  Pre visit completed and red dot placed by patient's name on their procedure day (on provider's schedule).

## 2024-05-17 ENCOUNTER — Other Ambulatory Visit

## 2024-05-23 ENCOUNTER — Telehealth: Payer: Self-pay | Admitting: Podiatry

## 2024-05-23 NOTE — Telephone Encounter (Signed)
 Patient called in stating he came to pick up handicap placard paperwork and the front desk didn't have it. I mentioned I don't see any past notes about a placard so that maybe the reason.Patient is requesting a handicap placard.

## 2024-05-26 NOTE — Telephone Encounter (Signed)
 Spoke to patient and will be in to pick up.

## 2024-05-30 ENCOUNTER — Telehealth: Payer: Self-pay | Admitting: Gastroenterology

## 2024-05-30 ENCOUNTER — Encounter: Admitting: Gastroenterology

## 2024-05-30 NOTE — Telephone Encounter (Signed)
 Patient has been rescheduled for procedure. Please provide updated prep instructions.

## 2024-05-30 NOTE — Telephone Encounter (Signed)
 Good Morning Dr. Legrand  I called this patient at 7:15am today he stated that he has the flu. He also stated that he called and spoke to afterhours service that he needed to cancel due to being ill.   I will NO SHOW him.  [employer]

## 2024-05-30 NOTE — Telephone Encounter (Signed)
 Sent new instructions to pt via MyChart. Sent him a message in MyChart to make him aware.

## 2024-05-31 ENCOUNTER — Ambulatory Visit: Admitting: Family Medicine

## 2024-05-31 ENCOUNTER — Ambulatory Visit (INDEPENDENT_AMBULATORY_CARE_PROVIDER_SITE_OTHER)

## 2024-05-31 ENCOUNTER — Encounter: Payer: Self-pay | Admitting: Family Medicine

## 2024-05-31 ENCOUNTER — Ambulatory Visit (INDEPENDENT_AMBULATORY_CARE_PROVIDER_SITE_OTHER): Admitting: Podiatry

## 2024-05-31 VITALS — BP 132/66 | HR 86 | Temp 98.6°F | Ht 71.0 in | Wt 278.1 lb

## 2024-05-31 VITALS — Ht 74.0 in | Wt 250.0 lb

## 2024-05-31 DIAGNOSIS — R059 Cough, unspecified: Secondary | ICD-10-CM

## 2024-05-31 DIAGNOSIS — J069 Acute upper respiratory infection, unspecified: Secondary | ICD-10-CM | POA: Diagnosis not present

## 2024-05-31 DIAGNOSIS — M19071 Primary osteoarthritis, right ankle and foot: Secondary | ICD-10-CM

## 2024-05-31 LAB — POC COVID19 BINAXNOW: SARS Coronavirus 2 Ag: NEGATIVE

## 2024-05-31 MED ORDER — BENZONATATE 200 MG PO CAPS: 200.0000 mg | ORAL_CAPSULE | Freq: Three times a day (TID) | ORAL | 0 refills | Status: AC | PRN

## 2024-05-31 NOTE — Progress Notes (Signed)
 Subjective:    Patient ID: Sean Lee, male    DOB: 05/21/1961, 63 y.o.   MRN: [id number]  HPI  Wt Readings from Last 3 Encounters:  05/31/24 278 lb 2 oz (126.2 kg)  05/31/24 250 lb (113.4 kg)  05/16/24 250 lb (113.4 kg)   38.79 kg/m  Vitals:   05/31/24 1156  BP: 132/66  Pulse: 86  Temp: 98.6 F (37 C)  SpO2: 98%   Pt presents with  Congestion and cough for a week   Since Friday(was out of town) Coughing-dry cough  No wheezing or shortness of breath  Sneezing  Runny nose /stuffy nose  Loss of sense of taste  Slight headache  No ST Ears feel full and stuffed up  Some body aches  Elevated temp at night - 101 max    Over the counter Vaporizer helps by bed  Chlorcedin  Does not have any tylenol  Ibuprofen  800 tid  Not taking oxycodone    Had his right ankle/foot surgery In boot  Is non weight bearing now but overall doing ok  Has not been out more  Gained weight/less active (says boot also weighs a lot)    Patient Active Problem List   Diagnosis Date Noted   Class 2 obesity due to excess calories with body mass index (BMI) of 38.0 to 38.9 in adult 02/02/2024   Encounter for screening for HIV 02/02/2024   Encounter for hepatitis C screening test for low risk patient 02/02/2024   BPH (benign prostatic hyperplasia) 02/02/2024   Productive cough 01/19/2024   Diabetes mellitus screening 01/19/2024   Nocturia 11/05/2022   Status post total left knee replacement 06/18/2022   Primary osteoarthritis of left knee 01/01/2022   Primary osteoarthritis of right knee 01/01/2022   Left knee pain 12/18/2021   Umbilical hernia 12/18/2021   Erectile dysfunction 12/31/2020   Insomnia 04/02/2020   Arthritis of right ankle 07/25/2019   Chronic pain of right ankle 08/31/2018   Routine general medical examination at a health care facility 11/01/2017   Prostate cancer screening 06/28/2017   DM2 (diabetes mellitus, type 2) (HCC) 11/05/2016   Alopecia 11/04/2016   Back  pain 05/06/2016   Sleep disorder 08/28/2015   Colon cancer screening 08/28/2015   Viral URI with cough 02/13/2013   Prediabetes 01/02/2013   Varicosities of leg 03/21/2012   Rectus diastasis of lower abdomen 03/21/2012   Obstructive sleep apnea 10/15/2009   Hyperlipidemia associated with type 2 diabetes mellitus (HCC) 02/02/2007   HEMATURIA, MICROSCOPIC 02/02/2007   Essential hypertension 02/01/2007   History of cardiovascular disorder 02/01/2007   Past Medical History:  Diagnosis Date   Contact dermatitis and other eczema due to plants (except food)    Diabetes mellitus without complication (HCC)    H/O: CVA (cardiovascular accident)    Carotid doppler neg (4/08)   Hematuria    Obesity, unspecified    Other and unspecified hyperlipidemia    Hx - no medication/diet controll and weight loss   Sacroiliitis, not elsewhere classified    Sleep apnea    uses CPAP but not every night   Unspecified essential hypertension    Varicose veins of left lower extremity    Past Surgical History:  Procedure Laterality Date   Carotid dopplers  11/16/2006   Negative   FOOT SURGERY Right 04/05/2024   [facility]- Dr. Thresa Fuse   HERNIA REPAIR     umbilical   TOTAL KNEE ARTHROPLASTY Left 06/18/2022   TOTAL KNEE ARTHROPLASTY Left 06/18/2022  Procedure: LEFT TOTAL KNEE ARTHROPLASTY;  Surgeon: Jerri Kay HERO, MD;  Location: [facility] OR;  Service: Orthopedics;  Laterality: Left;   VENTRAL HERNIA REPAIR N/A 05/04/2024   Procedure: REPAIR, HERNIA, VENTRAL WITH MESH;  Surgeon: Vernetta Berg, MD;  Location: [facility] OR;  Service: General;  Laterality: N/A;   Social History   Tobacco Use   Smoking status: Never   Smokeless tobacco: Never  Vaping Use   Vaping status: Never Used  Substance Use Topics   Alcohol use: Yes    Alcohol/week: 4.0 standard drinks of alcohol    Types: 4 Cans of beer per week    Comment: weekends- occasional   Drug use: No   Family History  Problem Relation Age of Onset    Hypertension Mother    Diabetes Mother    Esophageal cancer Mother    Prostate cancer Paternal Uncle    Prostate cancer Paternal Uncle    Hyperlipidemia Other        in family    Cancer Neg Hx    Rectal cancer Neg Hx    Stomach cancer Neg Hx    Colon cancer Neg Hx    Colon polyps Neg Hx    Allergies  Allergen Reactions   Benazepril Hcl Cough   Current Outpatient Medications on File Prior to Visit  Medication Sig Dispense Refill   ibuprofen  (ADVIL ) 800 MG tablet Take 1 tablet (800 mg total) by mouth 3 (three) times daily. 60 tablet 1   latanoprost (XALATAN) 0.005 % ophthalmic solution Place 1 drop into both eyes at bedtime.     metFORMIN  (GLUCOPHAGE -XR) 500 MG 24 hr tablet Take 1 tablet (500 mg total) by mouth daily with breakfast. 90 tablet 1   olmesartan -hydrochlorothiazide  (BENICAR  HCT) 40-25 MG tablet TAKE 1 TABLET BY MOUTH EVERY DAY 90 tablet 1   oxyCODONE  (OXY IR/ROXICODONE ) 5 MG immediate release tablet Take 1 tablet (5 mg total) by mouth every 6 (six) hours as needed for moderate pain (pain score 4-6) or severe pain (pain score 7-10). 30 tablet 0   tadalafil (CIALIS) 5 MG tablet Take 5 mg by mouth daily as needed.     tamsulosin (FLOMAX) 0.4 MG CAPS capsule Take 0.4 mg by mouth at bedtime.     timolol (TIMOPTIC) 0.5 % ophthalmic solution Place 1 drop into both eyes daily.     No current facility-administered medications on file prior to visit.    Review of Systems  Constitutional:  Positive for appetite change and fatigue. Negative for fever.  HENT:  Positive for congestion, postnasal drip, rhinorrhea, sinus pressure, sneezing and sore throat. Negative for ear discharge, ear pain, facial swelling and trouble swallowing.   Eyes:  Negative for pain and discharge.  Respiratory:  Positive for cough. Negative for shortness of breath, wheezing and stridor.   Cardiovascular:  Negative for chest pain.  Gastrointestinal:  Negative for diarrhea, nausea and vomiting.  Genitourinary:   Negative for frequency, hematuria and urgency.  Musculoskeletal:  Negative for arthralgias and myalgias.       Right foot/ankle pain post operative In a boot  Non weight bearing   Skin:  Negative for rash.  Neurological:  Negative for dizziness, weakness, light-headedness and headaches.  Psychiatric/Behavioral:  Negative for confusion and dysphoric mood.        Objective:   Physical Exam Constitutional:      General: He is not in acute distress.    Appearance: Normal appearance. He is well-developed. He is obese. He is not  ill-appearing, toxic-appearing or diaphoretic.  HENT:     Head: Normocephalic and atraumatic.     Comments: Nares are injected and congested    No facial tenderness     Right Ear: Tympanic membrane, ear canal and external ear normal.     Left Ear: Tympanic membrane, ear canal and external ear normal.     Nose: Congestion and rhinorrhea present.     Comments: Clear rhinorrhea     Mouth/Throat:     Mouth: Mucous membranes are moist.     Pharynx: Oropharynx is clear. No oropharyngeal exudate or posterior oropharyngeal erythema.     Comments: Clear pnd  Eyes:     General:        Right eye: No discharge.        Left eye: No discharge.     Conjunctiva/sclera: Conjunctivae normal.     Pupils: Pupils are equal, round, and reactive to light.  Cardiovascular:     Rate and Rhythm: Normal rate.     Heart sounds: Normal heart sounds.  Pulmonary:     Effort: Pulmonary effort is normal. No respiratory distress.     Breath sounds: Normal breath sounds. No stridor. No wheezing, rhonchi or rales.     Comments: No wheeze even on force exp No crackles or rales  Chest:     Chest wall: No tenderness.  Musculoskeletal:     Cervical back: Normal range of motion and neck supple.     Comments: Right foot in boot  Lymphadenopathy:     Cervical: No cervical adenopathy.  Skin:    General: Skin is warm and dry.     Capillary Refill: Capillary refill takes less than 2  seconds.     Findings: No rash.  Neurological:     Mental Status: He is alert.     Cranial Nerves: No cranial nerve deficit.  Psychiatric:        Mood and Affect: Mood normal.           Assessment & Plan:   Problem List Items Addressed This Visit       Respiratory   Viral URI with cough   Day 5-6  Sneezing/rhinorrhea and dry cough Fever several days ago- better now  Neg covid test today Reassuring exam /clear bs /no wheezing  Disc symptomatic care - see instructions on AVS  Can try delsym with caution of sedation  Tessalon  200 mg sent to pharmacy  Taking ibuprofen  for fever /pain control   Update if not starting to improve in a week or if worsening  Call back and Er precautions noted in detail today        Other Visit Diagnoses       Cough, unspecified type    -  Primary   Relevant Orders   POC COVID-19 (Completed)

## 2024-05-31 NOTE — Patient Instructions (Addendum)
 Get some zyrtec 10 mg (to help sneezing/runny nose and cough) take one daily Over the counter   For cough Gets some delsym over the counter and take as needed/as directed   I will sent tessalon  pearles- swallow one pill (whole) up to every 8 hours as needed for cough   If any severe symptoms go to the ER Update if not starting to improve in a week or if worsening   The ibuprofen  will help with body aches and fever  Tylenol  over the counter helps also   Glad your covid test is negative

## 2024-05-31 NOTE — Assessment & Plan Note (Addendum)
 Day 5-6  Sneezing/rhinorrhea and dry cough Fever several days ago- better now  Neg covid test today Reassuring exam /clear bs /no wheezing  Disc symptomatic care - see instructions on AVS  Can try delsym with caution of sedation  Tessalon  200 mg sent to pharmacy  Taking ibuprofen  for fever /pain control   Update if not starting to improve in a week or if worsening  Call back and Er precautions noted in detail today

## 2024-05-31 NOTE — Progress Notes (Signed)
   Chief Complaint  Patient presents with   Routine Post Op     POV # 4 DOS 03/30/24 RT FOOT TRIPLE ARTHRODESIS, RT FOOT TENDO ACHILLES LENGTHENING, pt states everything is going well, still has some swelling to the ankle, pain level at a 4, continues to wear cam boot and ambulate with knee scooter.      Subjective:  Patient presents today status post triple arthrodesis with tendo Achilles lengthening right.  DOS: 03/30/2024.  No pain.  He does have some right hip bursitis secondary to nonweightbearing to the surgical extremity.  He states that since last visit he has been mostly nonweightbearing and only putting minimal pressure on the extremity using the knee scooter  Past Medical History:  Diagnosis Date   Contact dermatitis and other eczema due to plants (except food)    Diabetes mellitus without complication (HCC)    H/O: CVA (cardiovascular accident)    Carotid doppler neg (4/08)   Hematuria    Obesity, unspecified    Other and unspecified hyperlipidemia    Hx - no medication/diet controll and weight loss   Sacroiliitis, not elsewhere classified    Sleep apnea    uses CPAP but not every night   Unspecified essential hypertension    Varicose veins of left lower extremity     Past Surgical History:  Procedure Laterality Date   Carotid dopplers  11/16/2006   Negative   FOOT SURGERY Right 04/05/2024   [facility]- Dr. Thresa Fuse   HERNIA REPAIR     umbilical   TOTAL KNEE ARTHROPLASTY Left 06/18/2022   TOTAL KNEE ARTHROPLASTY Left 06/18/2022   Procedure: LEFT TOTAL KNEE ARTHROPLASTY;  Surgeon: Jerri Kay HERO, MD;  Location: [facility] OR;  Service: Orthopedics;  Laterality: Left;   VENTRAL HERNIA REPAIR N/A 05/04/2024   Procedure: REPAIR, HERNIA, VENTRAL WITH MESH;  Surgeon: Vernetta Berg, MD;  Location: [facility] OR;  Service: General;  Laterality: N/A;    Allergies  Allergen Reactions   Benazepril Hcl Cough    Objective/Physical Exam Neurovascular status intact.  All incisions are  nicely healed.  Heavy edema noted around the surgical rearfoot.  No tenderness with palpation.  No crepitus with palpation  Radiographic Exam RT foot and ankle 05/31/2024:  The compression staple across the CC joint and o of the compression staples across the TN joint have broken legs.  There is also some collapse of the calcaneal inclination angle compared to prior x-rays.  Findings consistent with early weightbearing against advise  Assessment: 1. s/p triple arthrodesis with tendo Achilles lengthening of right. DOS: 03/30/2024   Plan of Care:  -Patient was evaluated.  X-rays reviewed with the patient demonstrating the broken hardware and the change of the calcaneal inclination angle.  Arthrodesis sites continue to seem to be stable for the moment however. -Continue strict nonweightbearing for an additional 4 weeks.  In 4 weeks we will likely initiate weightbearing and physical therapy -Return to clinic 4 weeks follow-up x-ray  Thresa EMERSON Sar, DPM [facility]  Dr. Thresa EMERSON Sar, DPM    [address] [place], [place] [address]                Office [phone]  Fax [phone]

## 2024-06-02 ENCOUNTER — Telehealth: Payer: Self-pay | Admitting: Lab

## 2024-06-02 ENCOUNTER — Encounter: Payer: Self-pay | Admitting: Podiatry

## 2024-06-02 NOTE — Telephone Encounter (Signed)
 [employer] new RTW date 07/17/24

## 2024-06-02 NOTE — Telephone Encounter (Signed)
 Please contact [employer] for his FMLA and extend for 4 more weeks per Dr.Evans his FMLA expires 06/04/2024.

## 2024-06-05 ENCOUNTER — Other Ambulatory Visit

## 2024-06-19 ENCOUNTER — Telehealth: Payer: Self-pay | Admitting: Podiatry

## 2024-06-19 NOTE — Telephone Encounter (Signed)
 pt lft mess to send note to [employer] [facility]. I cld him bk to adv faxed [phone] with RTW approx 07/17/24. his appt is next week and if need to update after appt will do

## 2024-06-28 ENCOUNTER — Ambulatory Visit (INDEPENDENT_AMBULATORY_CARE_PROVIDER_SITE_OTHER): Admitting: Podiatry

## 2024-06-28 ENCOUNTER — Encounter: Payer: Self-pay | Admitting: Podiatry

## 2024-06-28 ENCOUNTER — Ambulatory Visit (INDEPENDENT_AMBULATORY_CARE_PROVIDER_SITE_OTHER)

## 2024-06-28 ENCOUNTER — Ambulatory Visit

## 2024-06-28 VITALS — Ht 71.0 in | Wt 278.1 lb

## 2024-06-28 DIAGNOSIS — M62461 Contracture of muscle, right lower leg: Secondary | ICD-10-CM | POA: Diagnosis not present

## 2024-06-28 DIAGNOSIS — Z9889 Other specified postprocedural states: Secondary | ICD-10-CM | POA: Diagnosis not present

## 2024-06-28 DIAGNOSIS — M2142 Flat foot [pes planus] (acquired), left foot: Secondary | ICD-10-CM

## 2024-06-28 DIAGNOSIS — M19071 Primary osteoarthritis, right ankle and foot: Secondary | ICD-10-CM

## 2024-06-28 DIAGNOSIS — M2141 Flat foot [pes planus] (acquired), right foot: Secondary | ICD-10-CM

## 2024-06-28 DIAGNOSIS — M76821 Posterior tibial tendinitis, right leg: Secondary | ICD-10-CM

## 2024-06-28 DIAGNOSIS — M19079 Primary osteoarthritis, unspecified ankle and foot: Secondary | ICD-10-CM

## 2024-06-28 NOTE — Progress Notes (Signed)
Patient presents today to pick up custom molded foot orthotics, diagnosed with pes planus by Dr. Logan Bores.   Orthotics were dispensed and fit was satisfactory. Reviewed instructions for break-in and wear. Written instructions given to patient.  Patient will follow up as needed.

## 2024-06-28 NOTE — Progress Notes (Signed)
   Chief Complaint  Patient presents with   Routine Post Op    POV # 5 DOS 03/30/24 RT FOOT TRIPLE ARTHRODESIS, RT FOOT TENDO ACHILLES LENGTHENING, pt is here to f/u on right foot/ankle she states everything is going well, has no complaints still wearing boot and ambulate with knee scooter.      Subjective:  Patient presents today status post triple arthrodesis with tendo Achilles lengthening right.  DOS: 03/30/2024.  Doing well.  He states that over the last 4 weeks he has really been better about nonweightbearing to the extremity  Past Medical History:  Diagnosis Date   Contact dermatitis and other eczema due to plants (except food)    Diabetes mellitus without complication (HCC)    H/O: CVA (cardiovascular accident)    Carotid doppler neg (4/08)   Hematuria    Obesity, unspecified    Other and unspecified hyperlipidemia    Hx - no medication/diet controll and weight loss   Sacroiliitis, not elsewhere classified    Sleep apnea    uses CPAP but not every night   Unspecified essential hypertension    Varicose veins of left lower extremity     Past Surgical History:  Procedure Laterality Date   Carotid dopplers  11/16/2006   Negative   FOOT SURGERY Right 04/05/2024   [facility]- Dr. Thresa Fuse   HERNIA REPAIR     umbilical   TOTAL KNEE ARTHROPLASTY Left 06/18/2022   TOTAL KNEE ARTHROPLASTY Left 06/18/2022   Procedure: LEFT TOTAL KNEE ARTHROPLASTY;  Surgeon: Jerri Kay HERO, MD;  Location: [facility];  Service: Orthopedics;  Laterality: Left;   VENTRAL HERNIA REPAIR N/A 05/04/2024   Procedure: REPAIR, HERNIA, VENTRAL WITH MESH;  Surgeon: Vernetta Berg, MD;  Location: [facility];  Service: General;  Laterality: N/A;    Allergies  Allergen Reactions   Benazepril Hcl Cough    Objective/Physical Exam Neurovascular status intact.  All incisions are nicely healed.  Improved but persistent edema noted around the surgical rearfoot and ankle.  No tenderness with palpation.  No crepitus with  palpation or range of motion  Radiographic Exam RT foot and ankle 05/31/2024:  The compression staple across the CC joint and o of the compression staples across the TN joint have broken legs.  There is also some collapse of the calcaneal inclination angle compared to prior x-rays.  Findings consistent with early weightbearing against advise  Assessment: 1. s/p triple arthrodesis with tendo Achilles lengthening of right. DOS: 03/30/2024   Plan of Care:  -Patient was evaluated.  X-rays reviewed which appear to be stable -Begin weightbearing in the cam boot -Order placed for physical therapy -Continue to refrain from work for postoperative recovery at least through end of 2025 -Return to clinic 6 weeks follow-up x-ray and to potentially transition the patient out of the cam boot into good supportive tennis shoes and sneakers and discuss return to work date  Thresa EMERSON Sar, DPM [facility]  Dr. Thresa EMERSON Sar, DPM    [address] [place], [place] [address]                Office [phone]  Fax [phone]

## 2024-07-05 ENCOUNTER — Telehealth: Payer: Self-pay | Admitting: Gastroenterology

## 2024-07-05 DIAGNOSIS — Z860100 Personal history of colon polyps, unspecified: Secondary | ICD-10-CM

## 2024-07-05 MED ORDER — NA SULFATE-K SULFATE-MG SULF 17.5-3.13-1.6 GM/177ML PO SOLN: 1.0000 | Freq: Once | ORAL | 0 refills | Status: AC

## 2024-07-05 NOTE — Telephone Encounter (Signed)
 Spoke with patient. A new prescription for Suprep rx has been sent to the pharmacy.

## 2024-07-05 NOTE — Telephone Encounter (Signed)
 Inbound call from patient stating that he took her prep medication yesterday and believes he misunderstood the instruction. Patient is requesting a call back. Please advise.

## 2024-07-06 ENCOUNTER — Ambulatory Visit (AMBULATORY_SURGERY_CENTER): Admitting: Gastroenterology

## 2024-07-06 ENCOUNTER — Encounter: Payer: Self-pay | Admitting: Gastroenterology

## 2024-07-06 VITALS — BP 130/68 | HR 88 | Temp 98.4°F | Resp 16 | Ht 74.0 in | Wt 250.0 lb

## 2024-07-06 DIAGNOSIS — K5730 Diverticulosis of large intestine without perforation or abscess without bleeding: Secondary | ICD-10-CM

## 2024-07-06 DIAGNOSIS — K648 Other hemorrhoids: Secondary | ICD-10-CM | POA: Diagnosis not present

## 2024-07-06 DIAGNOSIS — Z860101 Personal history of adenomatous and serrated colon polyps: Secondary | ICD-10-CM

## 2024-07-06 DIAGNOSIS — Z1211 Encounter for screening for malignant neoplasm of colon: Secondary | ICD-10-CM | POA: Diagnosis present

## 2024-07-06 DIAGNOSIS — Z860100 Personal history of colon polyps, unspecified: Secondary | ICD-10-CM

## 2024-07-06 MED ORDER — SODIUM CHLORIDE 0.9 % IV SOLN: 500.0000 mL | Freq: Once | INTRAVENOUS | Status: DC

## 2024-07-06 NOTE — Progress Notes (Signed)
 Vss nad trans to pacu

## 2024-07-06 NOTE — Progress Notes (Signed)
 Pt's states no medical or surgical changes since previsit or office visit.

## 2024-07-06 NOTE — Progress Notes (Signed)
 History and Physical:  This patient presents for endoscopic testing for: Encounter Diagnosis  Name Primary?   Hx of colonic polyps Yes    Surveillance colonoscopy today for Hx polyps. Left colon SSP and HP in March 2017 Patient denies chronic abdominal pain, rectal bleeding, constipation or diarrhea.   Patient is otherwise without complaints or active issues today.   Past Medical History: Past Medical History:  Diagnosis Date   Contact dermatitis and other eczema due to plants (except food)    Diabetes mellitus without complication (HCC)    H/O: CVA (cardiovascular accident)    Carotid doppler neg (4/08)   Hematuria    Obesity, unspecified    Other and unspecified hyperlipidemia    Hx - no medication/diet controll and weight loss   Sacroiliitis, not elsewhere classified    Sleep apnea    uses CPAP but not every night   Unspecified essential hypertension    Varicose veins of left lower extremity      Past Surgical History: Past Surgical History:  Procedure Laterality Date   Carotid dopplers  11/16/2006   Negative   FOOT SURGERY Right 04/05/2024   [facility]- Dr. Thresa Fuse   HERNIA REPAIR     umbilical   TOTAL KNEE ARTHROPLASTY Left 06/18/2022   TOTAL KNEE ARTHROPLASTY Left 06/18/2022   Procedure: LEFT TOTAL KNEE ARTHROPLASTY;  Surgeon: Jerri Kay HERO, MD;  Location: [facility];  Service: Orthopedics;  Laterality: Left;   VENTRAL HERNIA REPAIR N/A 05/04/2024   Procedure: REPAIR, HERNIA, VENTRAL WITH MESH;  Surgeon: Vernetta Berg, MD;  Location: [facility];  Service: General;  Laterality: N/A;    Allergies: Allergies  Allergen Reactions   Benazepril Hcl Cough    Outpatient Meds: Current Outpatient Medications  Medication Sig Dispense Refill   latanoprost (XALATAN) 0.005 % ophthalmic solution Place 1 drop into both eyes at bedtime.     metFORMIN  (GLUCOPHAGE -XR) 500 MG 24 hr tablet Take 1 tablet (500 mg total) by mouth daily with breakfast. 90 tablet 1    olmesartan -hydrochlorothiazide  (BENICAR  HCT) 40-25 MG tablet TAKE 1 TABLET BY MOUTH EVERY DAY 90 tablet 1   timolol (TIMOPTIC) 0.5 % ophthalmic solution Place 1 drop into both eyes daily.     benzonatate  (TESSALON ) 200 MG capsule Take 1 capsule (200 mg total) by mouth 3 (three) times daily as needed for cough. Swallow whole 30 capsule 0   ibuprofen  (ADVIL ) 800 MG tablet Take 1 tablet (800 mg total) by mouth 3 (three) times daily. (Patient not taking: Reported on 07/06/2024) 60 tablet 1   oxyCODONE  (OXY IR/ROXICODONE ) 5 MG immediate release tablet Take 1 tablet (5 mg total) by mouth every 6 (six) hours as needed for moderate pain (pain score 4-6) or severe pain (pain score 7-10). 30 tablet 0   tadalafil (CIALIS) 5 MG tablet Take 5 mg by mouth daily as needed.     tamsulosin (FLOMAX) 0.4 MG CAPS capsule Take 0.4 mg by mouth at bedtime. (Patient not taking: Reported on 07/06/2024)     Current Facility-Administered Medications  Medication Dose Route Frequency Provider Last Rate Last Admin   0.9 %  sodium chloride  infusion  500 mL Intravenous Once Danis, Kazi Reppond L III, MD          ___________________________________________________________________ Objective   Exam:  BP (!) 151/91   Pulse 72   Temp 98.4 F (36.9 C)   Resp 15   Ht 6' 2 (1.88 m)   Wt 250 lb (113.4 kg)   SpO2 95%  BMI 32.10 kg/m   CV: regular , S1/S2 Resp: clear to auscultation bilaterally, normal RR and effort noted GI: soft, no tenderness, with active bowel sounds.   Assessment: Encounter Diagnosis  Name Primary?   Hx of colonic polyps Yes     Plan: Colonoscopy   The benefits and risks of the planned procedure(s) were described in detail with the patient or (when appropriate) their health care proxy.  Risks were outlined as including, but not limited to, bleeding, infection, perforation, adverse medication reaction leading to cardiac or pulmonary decompensation, pancreatitis (if ERCP).  The limitation of  incomplete mucosal visualization was also discussed.  No guarantees or warranties were given.  The patient was provided an opportunity to ask questions and all were answered. The patient agreed with the plan.   The patient is appropriate for an endoscopic procedure in the ambulatory setting.   - Victory Brand, MD

## 2024-07-06 NOTE — Patient Instructions (Signed)
 Handouts given: Diverticulosis, Hemorrhoids Resume previous diet. Continue present medications.  Preparation of colon was fair. Repeat colonoscopy in 3 years for surveillance. GoLytely prep for future colonoscopies.  OU HAD AN ENDOSCOPIC PROCEDURE TODAY AT THE [facility]:   Refer to the procedure report that was given to you for any specific questions about what was found during the examination.  If the procedure report does not answer your questions, please call your gastroenterologist to clarify.  If you requested that your care partner not be given the details of your procedure findings, then the procedure report has been included in a sealed envelope for you to review at your convenience later.  YOU SHOULD EXPECT: Some feelings of bloating in the abdomen. Passage of more gas than usual.  Walking can help get rid of the air that was put into your GI tract during the procedure and reduce the bloating. If you had a lower endoscopy (such as a colonoscopy or flexible sigmoidoscopy) you may notice spotting of blood in your stool or on the toilet paper. If you underwent a bowel prep for your procedure, you may not have a normal bowel movement for a few days.  Please Note:  You might notice some irritation and congestion in your nose or some drainage.  This is from the oxygen used during your procedure.  There is no need for concern and it should clear up in a day or so.  SYMPTOMS TO REPORT IMMEDIATELY:  Following lower endoscopy (colonoscopy or flexible sigmoidoscopy):  Excessive amounts of blood in the stool  Significant tenderness or worsening of abdominal pains  Swelling of the abdomen that is new, acute  Fever of 100F or higher  For urgent or emergent issues, a gastroenterologist can be reached at any hour by calling [phone]. Do not use MyChart messaging for urgent concerns.    DIET:  We do recommend a small meal at first, but then you may proceed to your regular diet.   Drink plenty of fluids but you should avoid alcoholic beverages for 24 hours.  ACTIVITY:  You should plan to take it easy for the rest of today and you should NOT DRIVE or use heavy machinery until tomorrow (because of the sedation medicines used during the test).    FOLLOW UP: Our staff will call the number listed on your records the next business day following your procedure.  We will call around 7:15- 8:00 am to check on you and address any questions or concerns that you may have regarding the information given to you following your procedure. If we do not reach you, we will leave a message.     If any biopsies were taken you will be contacted by phone or by letter within the next 1-3 weeks.  Please call us  at [phone] if you have not heard about the biopsies in 3 weeks.    SIGNATURES/CONFIDENTIALITY: You and/or your care partner have signed paperwork which will be entered into your electronic medical record.  These signatures attest to the fact that that the information above on your After Visit Summary has been reviewed and is understood.  Full responsibility of the confidentiality of this discharge information lies with you and/or your care-partner.

## 2024-07-06 NOTE — Op Note (Signed)
 [facility] Patient Name: Sean Lee Procedure Date: 07/06/2024 1:21 PM MRN: [id number] Endoscopist: Sean Lee , MD, [id number] Age: 63 Referring MD:  Date of Birth: [birth year] Gender: Male Account #: [id number] Procedure:                Colonoscopy Indications:              Personal history of colonic polyps                           Subcentimeter left colon SSP and hyperplastic polyp                            March 2017 Medicines:                Monitored Anesthesia Care Procedure:                Pre-Anesthesia Assessment:                           - Prior to the procedure, a History and Physical                            was performed, and patient medications and                            allergies were reviewed. The patient's tolerance of                            previous anesthesia was also reviewed. The risks                            and benefits of the procedure and the sedation                            options and risks were discussed with the patient.                            All questions were answered, and informed consent                            was obtained. Prior Anticoagulants: The patient has                            taken no anticoagulant or antiplatelet agents. ASA                            Grade Assessment: III - A patient with severe                            systemic disease. After reviewing the risks and                            benefits, the patient was deemed in satisfactory  condition to undergo the procedure.                           After obtaining informed consent, the colonoscope                            was passed under direct vision. Throughout the                            procedure, the patient's blood pressure, pulse, and                            oxygen saturations were monitored continuously. The                            CF [id number] #[id number] was introduced through the anus                             and advanced to the the cecum, identified by                            appendiceal orifice and ileocecal valve. The                            colonoscopy was somewhat difficult due to a                            redundant colon. Successful completion of the                            procedure was aided by straightening and shortening                            the scope to obtain bowel loop reduction and                            lavage. The patient tolerated the procedure fairly                            well. The quality of the bowel preparation was fair                            in some areas with adherent bilious material                            despite extensive lavage throughout the colon. The                            ileocecal valve, appendiceal orifice, and rectum                            were photographed. The bowel preparation used was  SUPREP. Scope In: 1:55:43 PM Scope Out: 2:14:51 PM Scope Withdrawal Time: 0 hours 16 minutes 2 seconds  Total Procedure Duration: 0 hours 19 minutes 8 seconds  Findings:                 The perianal and digital rectal examinations were                            normal.                           Repeat examination of right colon under NBI                            performed.                           Multiple diverticula were found in the left colon                            and right colon.                           Internal hemorrhoids were found. The hemorrhoids                            were small.                           The exam was otherwise without abnormality on                            direct and retroflexion views. Complications:            No immediate complications. Estimated Blood Loss:     Estimated blood loss: none. Impression:               - Preparation of the colon was fair.                           - Diverticulosis in the left colon and in the right                             colon.                           - Internal hemorrhoids.                           - The examination was otherwise normal on direct                            and retroflexion views.                           - No specimens collected. Recommendation:           - Patient has a contact number available for  emergencies. The signs and symptoms of potential                            delayed complications were discussed with the                            patient. Return to normal activities tomorrow.                            Written discharge instructions were provided to the                            patient.                           - Resume previous diet.                           - Continue present medications.                           - Repeat colonoscopy in 3 years for surveillance                            (due to suboptimal prep-see above). GoLytely prep                            for future colonoscopies. Sean Lee L. Legrand, MD 07/06/2024 2:21:47 PM This report has been signed electronically.

## 2024-07-07 ENCOUNTER — Telehealth: Payer: Self-pay

## 2024-07-07 NOTE — Telephone Encounter (Signed)
  Follow up Call-     07/06/2024   12:41 PM  Call back number  Post procedure Call Back phone  # [phone]  Permission to leave phone message Yes     Patient questions:  Do you have a fever, pain , or abdominal swelling? No. Pain Score  0 *  Have you tolerated food without any problems? Yes.    Have you been able to return to your normal activities? Yes.    Do you have any questions about your discharge instructions: Diet   No. Medications  No. Follow up visit  No.  Do you have questions or concerns about your Care? No.  Actions: * If pain score is 4 or above: No action needed, pain <4.

## 2024-07-10 ENCOUNTER — Ambulatory Visit

## 2024-07-17 ENCOUNTER — Ambulatory Visit

## 2024-07-17 ENCOUNTER — Other Ambulatory Visit: Payer: Self-pay

## 2024-07-17 DIAGNOSIS — Z9889 Other specified postprocedural states: Secondary | ICD-10-CM | POA: Insufficient documentation

## 2024-07-17 DIAGNOSIS — M25571 Pain in right ankle and joints of right foot: Secondary | ICD-10-CM | POA: Insufficient documentation

## 2024-07-17 DIAGNOSIS — R600 Localized edema: Secondary | ICD-10-CM | POA: Insufficient documentation

## 2024-07-17 NOTE — Therapy (Unsigned)
 OUTPATIENT PHYSICAL THERAPY LOWER EXTREMITY EVALUATION   Patient Name: Sean Lee MRN: [id number] DOB:1960/11/29, 63 y.o., male Today's Date: 07/17/2024  END OF SESSION:  PT End of Session - 07/17/24 0908     Visit Number 1    Number of Visits 24    Date for Recertification  10/09/24    Authorization Type UHC    Authorization Time Period no auth required    PT Start Time 0915    PT Stop Time 1000    PT Time Calculation (min) 45 min    Equipment Utilized During Treatment --    Activity Tolerance Patient tolerated treatment well    Behavior During Therapy WFL for tasks assessed/performed          Past Medical History:  Diagnosis Date   Contact dermatitis and other eczema due to plants (except food)    Diabetes mellitus without complication (HCC)    H/O: CVA (cardiovascular accident)    Carotid doppler neg (4/08)   Hematuria    Obesity, unspecified    Other and unspecified hyperlipidemia    Hx - no medication/diet controll and weight loss   Sacroiliitis, not elsewhere classified    Sleep apnea    uses CPAP but not every night   Unspecified essential hypertension    Varicose veins of left lower extremity    Past Surgical History:  Procedure Laterality Date   Carotid dopplers  11/16/2006   Negative   FOOT SURGERY Right 04/05/2024   [facility]- Dr. Thresa Fuse   HERNIA REPAIR     umbilical   TOTAL KNEE ARTHROPLASTY Left 06/18/2022   TOTAL KNEE ARTHROPLASTY Left 06/18/2022   Procedure: LEFT TOTAL KNEE ARTHROPLASTY;  Surgeon: Jerri Kay HERO, MD;  Location: [facility] OR;  Service: Orthopedics;  Laterality: Left;   VENTRAL HERNIA REPAIR N/A 05/04/2024   Procedure: REPAIR, HERNIA, VENTRAL WITH MESH;  Surgeon: Vernetta Berg, MD;  Location: [facility] OR;  Service: General;  Laterality: N/A;   Patient Active Problem List   Diagnosis Date Noted   Class 2 obesity due to excess calories with body mass index (BMI) of 38.0 to 38.9 in adult 02/02/2024   Encounter for screening for HIV  02/02/2024   Encounter for hepatitis C screening test for low risk patient 02/02/2024   BPH (benign prostatic hyperplasia) 02/02/2024   Productive cough 01/19/2024   Diabetes mellitus screening 01/19/2024   Nocturia 11/05/2022   Status post total left knee replacement 06/18/2022   Primary osteoarthritis of left knee 01/01/2022   Primary osteoarthritis of right knee 01/01/2022   Left knee pain 12/18/2021   Umbilical hernia 12/18/2021   Erectile dysfunction 12/31/2020   Insomnia 04/02/2020   Arthritis of right ankle 07/25/2019   Chronic pain of right ankle 08/31/2018   Routine general medical examination at a health care facility 11/01/2017   Prostate cancer screening 06/28/2017   DM2 (diabetes mellitus, type 2) (HCC) 11/05/2016   Alopecia 11/04/2016   Back pain 05/06/2016   Sleep disorder 08/28/2015   Colon cancer screening 08/28/2015   Viral URI with cough 02/13/2013   Prediabetes 01/02/2013   Varicosities of leg 03/21/2012   Rectus diastasis of lower abdomen 03/21/2012   Obstructive sleep apnea 10/15/2009   Hyperlipidemia associated with type 2 diabetes mellitus (HCC) 02/02/2007   HEMATURIA, MICROSCOPIC 02/02/2007   Essential hypertension 02/01/2007   History of cardiovascular disorder 02/01/2007    PCP: Randeen Laine DELENA, MD  REFERRING PROVIDER:   Janit Thresa HERO, DPM   REFERRING DIAG:  Z98.890 (ICD-10-CM) - S/P foot surgery   THERAPY DIAG:  Pain in right ankle and joints of right foot  Localized edema  Rationale for Evaluation and Treatment: Rehabilitation  ONSET DATE: 03/30/2024  SUBJECTIVE:   SUBJECTIVE STATEMENT: Pt reports having ankle surgery in August. He has been in a boot since with improving weight bearing status. His surgeon wants him to come in the CAM boot until he return to his office on 12/22. The pt arrived without the CAM boot, just in sandals. He is reporting no pain during the day, only when in bed at night. He notes this pain is not every night. Pt  wants to get back to work and doing more around the house/in the yard.   PERTINENT HISTORY: DOS 03/30/24 RT FOOT TRIPLE ARTHRODESIS, RT FOOT TENDO ACHILLES LENGTHENING obesity, DM II, OA of knees, HTN, sleep apnea PAIN:  Are you having pain? Yes: NPRS scale: 0/10 current Pain location: top of R foot Pain description: ache Aggravating factors: happens at night when in bed Relieving factors: unknown  PRECAUTIONS: None  RED FLAGS: None   WEIGHT BEARING RESTRICTIONS: Yes FWB in CAM boot  FALLS:  Has patient fallen in last 6 months? No  LIVING ENVIRONMENT: Lives with: lives with their family Lives in: House/apartment Stairs: Yes: Internal: 4-5 steps; can reach both and External: 12-14 steps; can reach both Has following equipment at home: Single point cane and knee scooter  OCCUPATION: truck driver  PLOF: Independent  PATIENT GOALS: get back to work  NEXT MD VISIT: 08/07/2024  OBJECTIVE:  Note: Objective measures were completed at Evaluation unless otherwise noted.  PATIENT SURVEYS:  LEFS  Extreme difficulty/unable (0), Quite a bit of difficulty (1), Moderate difficulty (2), Little difficulty (3), No difficulty (4) Survey date:  07/17/2024  Any of your usual work, housework or school activities 3  2. Usual hobbies, recreational or sporting activities 2  3. Getting into/out of the bath 3  4. Walking between rooms 3  5. Putting on socks/shoes 1  6. Squatting  0  7. Lifting an object, like a bag of groceries from the floor 4  8. Performing light activities around your home 4  9. Performing heavy activities around your home 3  10. Getting into/out of a car 2  11. Walking 2 blocks 1  12. Walking 1 mile 0  13. Going up/down 10 stairs (1 flight) 2  14. Standing for 1 hour 2  15.  sitting for 1 hour 4  16. Running on even ground 0  17. Running on uneven ground 0  18. Making sharp turns while running fast 0  19. Hopping  0  20. Rolling over in bed 4  Score total:   38/80    COGNITION: Overall cognitive status: Within functional limits for tasks assessed     SENSATION: WFL  EDEMA:  Figure 8: deferred  POSTURE: weight shift left  PALPATION: Tenderness over medial aspect   LOWER EXTREMITY ROM:  Active ROM Right eval Left eval  Hip flexion    Hip extension    Hip abduction    Hip adduction    Hip internal rotation    Hip external rotation    Knee flexion    Knee extension    Ankle dorsiflexion 0   Ankle plantarflexion 30   Ankle inversion 75% limited   Ankle eversion 50% limited     (Blank rows = not tested)  LOWER EXTREMITY MMT:  MMT Right eval Left eval  Hip  flexion    Hip extension    Hip abduction    Hip adduction    Hip internal rotation    Hip external rotation    Knee flexion    Knee extension    Ankle dorsiflexion 4/5   Ankle plantarflexion    Ankle inversion    Ankle eversion 4/5    (Blank rows = not tested)  FUNCTIONAL TESTS:  SLS - unable on R, able on L for 5+ seconds  - deferred, no boot on today 30 sec STS - deferred, no boot on today                                                                                                                                TREATMENT DATE:  [facility] Adult PT Treatment:                                                DATE: 07/17/2024    Initial evaluation: see patient education and home exercise program as noted below    PATIENT EDUCATION:  Education details: POC, HEP, diagnosis, prognosis.  Person educated: Patient Education method: Explanation, Demonstration, Tactile cues, Verbal cues, and Handouts Education comprehension: verbalized understanding, returned demonstration, verbal cues required, tactile cues required, and needs further education  HOME EXERCISE PROGRAM: Access Code: [email or web address] URL: [email or web address] Date: 07/17/2024 Prepared by: Marijo Berber  Exercises - Towel Scrunches  - 2-3 x daily - 7 x weekly - 3 sets - 10 reps -  Seated Ankle Alphabet  - 2-3 x daily - 7 x weekly - 3 sets - 10 reps - Seated Ankle Circles  - 2-3 x daily - 7 x weekly - 3 sets - 10 reps - Seated Heel Raise  - 2-3 x daily - 7 x weekly - 3 sets - 10 reps - Seated Ankle Eversion with Resistance  - 2 x daily - 7 x weekly - 2 sets - 10 reps - Long Sitting Ankle Plantar Flexion with Resistance  - 2 x daily - 7 x weekly - 2 sets - 10 reps - Seated Ankle Inversion with Resistance  - 2 x daily - 7 x weekly - 2 sets - 10 reps  Patient Education - Leg Elevation for Swelling  ASSESSMENT:  CLINICAL IMPRESSION: Patient is a 63 y.o. male who was seen today for physical therapy evaluation and treatment for s/p R ankle surgery. Pt presents with limited ROM and weakness of the R ankle. Pt is to be in the CAM boot when ambulating but arrived without it on today. Some tests and measures deferred since pt was not in the boot. Pt is currently limited in walking, standing, work tasks, lifting, carrying, pushing/pulling, community activities, and recreational activities. The pt will benefit from skilled physical therapy to return decrease pain and  increase function.    OBJECTIVE IMPAIRMENTS: Abnormal gait, decreased balance, decreased endurance, decreased mobility, difficulty walking, decreased ROM, decreased strength, hypomobility, increased edema, impaired flexibility, improper body mechanics, and pain.   ACTIVITY LIMITATIONS: lifting, bending, standing, squatting, stairs, and dressing  PARTICIPATION LIMITATIONS: driving, community activity, occupation, and church  PERSONAL FACTORS: Time since onset of injury/illness/exacerbation and 3+ comorbidities: obesity, DM II, OA of knees, HTN, sleep apnea are also affecting patient's functional outcome.   REHAB POTENTIAL: Good  CLINICAL DECISION MAKING: Evolving/moderate complexity  EVALUATION COMPLEXITY: Moderate   GOALS: Goals reviewed with patient? Yes  SHORT TERM GOALS: Target date: 08/28/2024 Pt will be  compliant and independent with HEP to assist with symptom management/recovery at home.  Baseline: [id number] Goal status: INITIAL  2.  Pt will demonstrate 5 degrees of active DF and 45 degrees of active PF.  Baseline:  Ankle dorsiflexion 0  Ankle plantarflexion 30  Ankle inversion 75% limited  Ankle eversion 50% limited    Goal status: INITIAL  3.  Pt will be able to walk 1,000 ft without boot on and less than 3/10 pain.  Baseline: walking with boot Goal status: INITIAL   LONG TERM GOALS: Target date: 10/09/2024  Pt will demonstrate 25% improvement in eversion and inversion AROM. Baseline:  Ankle dorsiflexion 0  Ankle plantarflexion 30  Ankle inversion 75% limited  Ankle eversion 50% limited    Goal status: INITIAL  2.  Pt will be able to perform SLS on the R for at least 5 seconds.  Baseline: unable Goal status: INITIAL  3.  Pt will be able to squat and lift 40lbs x 5 in order to return to work based activities.  Baseline: unable  Goal status: INITIAL  4.  Pt will score a 60/80 or better on the LEFS.  Baseline: 38/80 Goal status: INITIAL  5.  Pt will be comfortable with her final HEP in order to continue any symptom management at home and to avoid regression.   Baseline: [id number] Goal status: INITIAL   PLAN:  PT FREQUENCY: 2x/week  PT DURATION: 12 weeks  PLANNED INTERVENTIONS: 97164- PT Re-evaluation, 97110-Therapeutic exercises, 97530- Therapeutic activity, 97112- Neuromuscular re-education, 97535- Self Care, 02859- Manual therapy, [phone]- Gait training, [phone]- Aquatic Therapy, [phone]- Electrical stimulation (unattended), 97016- Vasopneumatic device, [id number]- Ionotophoresis 4mg /ml Dexamethasone , 79439 (1-2 muscles), 20561 (3+ muscles)- Dry Needling, Patient/Family education, Balance training, Stair training, Taping, Joint mobilization, Scar mobilization, Cryotherapy, and Moist heat  PLAN FOR NEXT SESSION:  -postop protocol  -FWB in CAM boot.  -PROM and exercises  against light resistance  Tests and measures if pt is in boot, gentle manual, gentle strengthening.   Marijo DELENA Berber, PT 07/17/2024, 10:17 AM

## 2024-07-19 ENCOUNTER — Ambulatory Visit

## 2024-07-19 DIAGNOSIS — M25571 Pain in right ankle and joints of right foot: Secondary | ICD-10-CM

## 2024-07-19 DIAGNOSIS — R600 Localized edema: Secondary | ICD-10-CM

## 2024-07-19 NOTE — Therapy (Signed)
 OUTPATIENT PHYSICAL THERAPY LOWER EXTREMITY EVALUATION   Patient Name: Sean Lee MRN: [id number] DOB:[birth year], 63 y.o., male Today's Date: 07/19/2024  END OF SESSION:  PT End of Session - 07/19/24 0956     Visit Number 2    Number of Visits 24    Date for Recertification  10/09/24    Authorization Type UHC    Authorization Time Period no auth required    PT Start Time 1000    PT Stop Time 1045    PT Time Calculation (min) 45 min    Activity Tolerance Patient tolerated treatment well    Behavior During Therapy WFL for tasks assessed/performed          Past Medical History:  Diagnosis Date   Contact dermatitis and other eczema due to plants (except food)    Diabetes mellitus without complication (HCC)    H/O: CVA (cardiovascular accident)    Carotid doppler neg (4/08)   Hematuria    Obesity, unspecified    Other and unspecified hyperlipidemia    Hx - no medication/diet controll and weight loss   Sacroiliitis, not elsewhere classified    Sleep apnea    uses CPAP but not every night   Unspecified essential hypertension    Varicose veins of left lower extremity    Past Surgical History:  Procedure Laterality Date   Carotid dopplers  11/16/2006   Negative   FOOT SURGERY Right 04/05/2024   [facility]- Dr. Thresa Fuse   HERNIA REPAIR     umbilical   TOTAL KNEE ARTHROPLASTY Left 06/18/2022   TOTAL KNEE ARTHROPLASTY Left 06/18/2022   Procedure: LEFT TOTAL KNEE ARTHROPLASTY;  Surgeon: Jerri Kay HERO, MD;  Location: [facility] OR;  Service: Orthopedics;  Laterality: Left;   VENTRAL HERNIA REPAIR N/A 05/04/2024   Procedure: REPAIR, HERNIA, VENTRAL WITH MESH;  Surgeon: Vernetta Berg, MD;  Location: [facility] OR;  Service: General;  Laterality: N/A;   Patient Active Problem List   Diagnosis Date Noted   Class 2 obesity due to excess calories with body mass index (BMI) of 38.0 to 38.9 in adult 02/02/2024   Encounter for screening for HIV 02/02/2024   Encounter for hepatitis C screening  test for low risk patient 02/02/2024   BPH (benign prostatic hyperplasia) 02/02/2024   Productive cough 01/19/2024   Diabetes mellitus screening 01/19/2024   Nocturia 11/05/2022   Status post total left knee replacement 06/18/2022   Primary osteoarthritis of left knee 01/01/2022   Primary osteoarthritis of right knee 01/01/2022   Left knee pain 12/18/2021   Umbilical hernia 12/18/2021   Erectile dysfunction 12/31/2020   Insomnia 04/02/2020   Arthritis of right ankle 07/25/2019   Chronic pain of right ankle 08/31/2018   Routine general medical examination at a health care facility 11/01/2017   Prostate cancer screening 06/28/2017   DM2 (diabetes mellitus, type 2) (HCC) 11/05/2016   Alopecia 11/04/2016   Back pain 05/06/2016   Sleep disorder 08/28/2015   Colon cancer screening 08/28/2015   Viral URI with cough 02/13/2013   Prediabetes 01/02/2013   Varicosities of leg 03/21/2012   Rectus diastasis of lower abdomen 03/21/2012   Obstructive sleep apnea 10/15/2009   Hyperlipidemia associated with type 2 diabetes mellitus (HCC) 02/02/2007   HEMATURIA, MICROSCOPIC 02/02/2007   Essential hypertension 02/01/2007   History of cardiovascular disorder 02/01/2007    PCP: Randeen Laine DELENA, MD  REFERRING PROVIDER:   Janit Thresa HERO, DPM   REFERRING DIAG: [id number] (ICD-10-CM) - S/P foot surgery  THERAPY DIAG:  Pain in right ankle and joints of right foot  Localized edema  Rationale for Evaluation and Treatment: Rehabilitation  ONSET DATE: 03/30/2024  SUBJECTIVE:   SUBJECTIVE STATEMENT: 07/19/2024 Pt reports increased pain after trying the exercises yesterday. Pt attempted heel raises standing. The HEP shows seated heel raises. He notes otherwise doing well.    Pt reports having ankle surgery in August. He has been in a boot since with improving weight bearing status. His surgeon wants him to come in the CAM boot until he return to his office on 12/22. The pt arrived without the CAM  boot, just in sandals. He is reporting no pain during the day, only when in bed at night. He notes this pain is not every night. Pt wants to get back to work and doing more around the house/in the yard.   PERTINENT HISTORY: DOS 03/30/24 RT FOOT TRIPLE ARTHRODESIS, RT FOOT TENDO ACHILLES LENGTHENING obesity, DM II, OA of knees, HTN, sleep apnea PAIN:  Are you having pain? Yes: NPRS scale: 0/10 current Pain location: top of R foot Pain description: ache Aggravating factors: happens at night when in bed Relieving factors: unknown  PRECAUTIONS: None  RED FLAGS: None   WEIGHT BEARING RESTRICTIONS: Yes FWB in CAM boot  FALLS:  Has patient fallen in last 6 months? No  LIVING ENVIRONMENT: Lives with: lives with their family Lives in: House/apartment Stairs: Yes: Internal: 4-5 steps; can reach both and External: 12-14 steps; can reach both Has following equipment at home: Single point cane and knee scooter  OCCUPATION: truck driver  PLOF: Independent  PATIENT GOALS: get back to work  NEXT MD VISIT: 08/07/2024  OBJECTIVE:  Note: Objective measures were completed at Evaluation unless otherwise noted.  PATIENT SURVEYS:  LEFS  Extreme difficulty/unable (0), Quite a bit of difficulty (1), Moderate difficulty (2), Little difficulty (3), No difficulty (4) Survey date:  07/17/2024  Any of your usual work, housework or school activities 3  2. Usual hobbies, recreational or sporting activities 2  3. Getting into/out of the bath 3  4. Walking between rooms 3  5. Putting on socks/shoes 1  6. Squatting  0  7. Lifting an object, like a bag of groceries from the floor 4  8. Performing light activities around your home 4  9. Performing heavy activities around your home 3  10. Getting into/out of a car 2  11. Walking 2 blocks 1  12. Walking 1 mile 0  13. Going up/down 10 stairs (1 flight) 2  14. Standing for 1 hour 2  15.  sitting for 1 hour 4  16. Running on even ground 0  17.  Running on uneven ground 0  18. Making sharp turns while running fast 0  19. Hopping  0  20. Rolling over in bed 4  Score total:  38/80    COGNITION: Overall cognitive status: Within functional limits for tasks assessed     SENSATION: WFL  EDEMA:  Figure 8: deferred  POSTURE: weight shift left  PALPATION: Tenderness over medial aspect   LOWER EXTREMITY ROM:  Active ROM Right eval Left eval  Hip flexion    Hip extension    Hip abduction    Hip adduction    Hip internal rotation    Hip external rotation    Knee flexion    Knee extension    Ankle dorsiflexion 0   Ankle plantarflexion 30   Ankle inversion 75% limited   Ankle eversion 50% limited     (  Blank rows = not tested)  LOWER EXTREMITY MMT:  MMT Right eval Left eval  Hip flexion    Hip extension    Hip abduction    Hip adduction    Hip internal rotation    Hip external rotation    Knee flexion    Knee extension    Ankle dorsiflexion 4/5   Ankle plantarflexion    Ankle inversion    Ankle eversion 4/5    (Blank rows = not tested)  FUNCTIONAL TESTS:  SLS - unable on R, able on L for 5+ seconds  - deferred, no boot on today 30 sec STS - deferred, no boot on today                                                                                                                                TREATMENT DATE:  [facility] Adult PT Treatment:                                                DATE: 07/19/2024    Therapeutic Exercise Seated ankle 4 way YTB x 15 each  Seated BAPS board level 2 x 20 CW and CCW Seated DF x 20   Manual:  STM to posterior and lateral lower leg  Fibular head mobilizations posterior and anterior  Gentle talocrural distraction  MT mobilizations   PATIENT EDUCATION:  Education details: POC, HEP, diagnosis, prognosis.  Person educated: Patient Education method: Explanation, Demonstration, Tactile cues, Verbal cues, and Handouts Education comprehension: verbalized understanding,  returned demonstration, verbal cues required, tactile cues required, and needs further education  HOME EXERCISE PROGRAM: Access Code: [email or web address] URL: [email or web address] Date: 07/17/2024 Prepared by: Marijo Berber  Exercises - Towel Scrunches  - 2-3 x daily - 7 x weekly - 3 sets - 10 reps - Seated Ankle Alphabet  - 2-3 x daily - 7 x weekly - 3 sets - 10 reps - Seated Ankle Circles  - 2-3 x daily - 7 x weekly - 3 sets - 10 reps - Seated Heel Raise  - 2-3 x daily - 7 x weekly - 3 sets - 10 reps - Seated Ankle Eversion with Resistance  - 2 x daily - 7 x weekly - 2 sets - 10 reps - Long Sitting Ankle Plantar Flexion with Resistance  - 2 x daily - 7 x weekly - 2 sets - 10 reps - Seated Ankle Inversion with Resistance  - 2 x daily - 7 x weekly - 2 sets - 10 reps  Patient Education - Leg Elevation for Swelling  ASSESSMENT:  CLINICAL IMPRESSION: 07/19/2024 Manual performed first to increase ROM and muscle stimulus. Pt is limited in IV and EV with typical compensation of hip and knee. Pt better able to isolate ankle by end of session.  Popping  sensation noted with IV and EV as well, no pain. The pt will benefit from continued skilled physical therapy to decrease pain and increase function.    Patient is a 63 y.o. male who was seen today for physical therapy evaluation and treatment for s/p R ankle surgery. Pt presents with limited ROM and weakness of the R ankle. Pt is to be in the CAM boot when ambulating but arrived without it on today. Some tests and measures deferred since pt was not in the boot. Pt is currently limited in walking, standing, work tasks, lifting, carrying, pushing/pulling, community activities, and recreational activities. The pt will benefit from skilled physical therapy to return decrease pain and increase function.    OBJECTIVE IMPAIRMENTS: Abnormal gait, decreased balance, decreased endurance, decreased mobility, difficulty walking, decreased ROM, decreased  strength, hypomobility, increased edema, impaired flexibility, improper body mechanics, and pain.   ACTIVITY LIMITATIONS: lifting, bending, standing, squatting, stairs, and dressing  PARTICIPATION LIMITATIONS: driving, community activity, occupation, and church  PERSONAL FACTORS: Time since onset of injury/illness/exacerbation and 3+ comorbidities: obesity, DM II, OA of knees, HTN, sleep apnea are also affecting patient's functional outcome.   REHAB POTENTIAL: Good  CLINICAL DECISION MAKING: Evolving/moderate complexity  EVALUATION COMPLEXITY: Moderate   GOALS: Goals reviewed with patient? Yes  SHORT TERM GOALS: Target date: 08/28/2024 Pt will be compliant and independent with HEP to assist with symptom management/recovery at home.  Baseline: [id number] Goal status: INITIAL  2.  Pt will demonstrate 5 degrees of active DF and 45 degrees of active PF.  Baseline:  Ankle dorsiflexion 0  Ankle plantarflexion 30  Ankle inversion 75% limited  Ankle eversion 50% limited    Goal status: INITIAL  3.  Pt will be able to walk 1,000 ft without boot on and less than 3/10 pain.  Baseline: walking with boot Goal status: INITIAL   LONG TERM GOALS: Target date: 10/09/2024  Pt will demonstrate 25% improvement in eversion and inversion AROM. Baseline:  Ankle dorsiflexion 0  Ankle plantarflexion 30  Ankle inversion 75% limited  Ankle eversion 50% limited    Goal status: INITIAL  2.  Pt will be able to perform SLS on the R for at least 5 seconds.  Baseline: unable Goal status: INITIAL  3.  Pt will be able to squat and lift 40lbs x 5 in order to return to work based activities.  Baseline: unable  Goal status: INITIAL  4.  Pt will score a 60/80 or better on the LEFS.  Baseline: 38/80 Goal status: INITIAL  5.  Pt will be comfortable with her final HEP in order to continue any symptom management at home and to avoid regression.   Baseline: [id number] Goal status:  INITIAL   PLAN:  PT FREQUENCY: 2x/week  PT DURATION: 12 weeks  PLANNED INTERVENTIONS: 97164- PT Re-evaluation, 97110-Therapeutic exercises, 97530- Therapeutic activity, 97112- Neuromuscular re-education, 97535- Self Care, 02859- Manual therapy, [phone]- Gait training, [phone]- Aquatic Therapy, [phone]- Electrical stimulation (unattended), 97016- Vasopneumatic device, [id number]- Ionotophoresis 4mg /ml Dexamethasone , 79439 (1-2 muscles), 20561 (3+ muscles)- Dry Needling, Patient/Family education, Balance training, Stair training, Taping, Joint mobilization, Scar mobilization, Cryotherapy, and Moist heat  PLAN FOR NEXT SESSION:  -postop protocol  -FWB in CAM boot.  -PROM and exercises against light resistance  Tests and measures if pt is in boot, gentle manual, gentle strengthening. Knee and hip strengthening.   Marijo DELENA Berber, PT 07/19/2024, 11:18 AM

## 2024-07-21 ENCOUNTER — Telehealth: Payer: Self-pay | Admitting: Podiatry

## 2024-07-21 NOTE — Telephone Encounter (Signed)
 Faxed notes from July- present to [facility] [phone] and adv of revised RTW is now 08/16/24 per Dr. Janit. No charge since same form from Aug and notes were requested

## 2024-07-26 ENCOUNTER — Ambulatory Visit

## 2024-07-26 DIAGNOSIS — R600 Localized edema: Secondary | ICD-10-CM

## 2024-07-26 DIAGNOSIS — M25571 Pain in right ankle and joints of right foot: Secondary | ICD-10-CM | POA: Diagnosis not present

## 2024-07-26 NOTE — Therapy (Signed)
 OUTPATIENT PHYSICAL THERAPY LOWER EXTREMITY EVALUATION   Patient Name: Sean Lee MRN: [id number] DOB:05/17/1961, 63 y.o., male Today's Date: 07/26/2024  END OF SESSION:  PT End of Session - 07/26/24 0912     Visit Number 3    Number of Visits 24    Date for Recertification  10/09/24    Authorization Type [employer]    Authorization Time Period no auth required    PT Start Time 0915    PT Stop Time 0955    PT Time Calculation (min) 40 min    Activity Tolerance Patient tolerated treatment well    Behavior During Therapy [facility] for tasks assessed/performed           Past Medical History:  Diagnosis Date   Contact dermatitis and other eczema due to plants (except food)    Diabetes mellitus without complication (HCC)    H/O: CVA (cardiovascular accident)    Carotid doppler neg (4/08)   Hematuria    Obesity, unspecified    Other and unspecified hyperlipidemia    Hx - no medication/diet controll and weight loss   Sacroiliitis, not elsewhere classified    Sleep apnea    uses CPAP but not every night   Unspecified essential hypertension    Varicose veins of left lower extremity    Past Surgical History:  Procedure Laterality Date   Carotid dopplers  11/16/2006   Negative   FOOT SURGERY Right 04/05/2024   [facility]- Dr. Thresa Fuse   HERNIA REPAIR     umbilical   TOTAL KNEE ARTHROPLASTY Left 06/18/2022   TOTAL KNEE ARTHROPLASTY Left 06/18/2022   Procedure: LEFT TOTAL KNEE ARTHROPLASTY;  Surgeon: Jerri Kay HERO, MD;  Location: [facility] OR;  Service: Orthopedics;  Laterality: Left;   VENTRAL HERNIA REPAIR N/A 05/04/2024   Procedure: REPAIR, HERNIA, VENTRAL WITH MESH;  Surgeon: Vernetta Berg, MD;  Location: [facility] OR;  Service: General;  Laterality: N/A;   Patient Active Problem List   Diagnosis Date Noted   Class 2 obesity due to excess calories with body mass index (BMI) of 38.0 to 38.9 in adult 02/02/2024   Encounter for screening for HIV 02/02/2024   Encounter for hepatitis C  screening test for low risk patient 02/02/2024   BPH (benign prostatic hyperplasia) 02/02/2024   Productive cough 01/19/2024   Diabetes mellitus screening 01/19/2024   Nocturia 11/05/2022   Status post total left knee replacement 06/18/2022   Primary osteoarthritis of left knee 01/01/2022   Primary osteoarthritis of right knee 01/01/2022   Left knee pain 12/18/2021   Umbilical hernia 12/18/2021   Erectile dysfunction 12/31/2020   Insomnia 04/02/2020   Arthritis of right ankle 07/25/2019   Chronic pain of right ankle 08/31/2018   Routine general medical examination at a health care facility 11/01/2017   Prostate cancer screening 06/28/2017   DM2 (diabetes mellitus, type 2) (HCC) 11/05/2016   Alopecia 11/04/2016   Back pain 05/06/2016   Sleep disorder 08/28/2015   Colon cancer screening 08/28/2015   Viral URI with cough 02/13/2013   Prediabetes 01/02/2013   Varicosities of leg 03/21/2012   Rectus diastasis of lower abdomen 03/21/2012   Obstructive sleep apnea 10/15/2009   Hyperlipidemia associated with type 2 diabetes mellitus (HCC) 02/02/2007   HEMATURIA, MICROSCOPIC 02/02/2007   Essential hypertension 02/01/2007   History of cardiovascular disorder 02/01/2007    PCP: Randeen Laine DELENA, MD  REFERRING PROVIDER:   Janit Thresa HERO, DPM   REFERRING DIAG: [phone] (ICD-10-CM) - S/P foot surgery  THERAPY DIAG:  Pain in right ankle and joints of right foot  Localized edema  Rationale for Evaluation and Treatment: Rehabilitation  ONSET DATE: 03/30/2024  SUBJECTIVE:   SUBJECTIVE STATEMENT: 07/26/2024 Pt reports some burning sensation on the top of his foot randomly. He thinks that the broken bracket in his foot is causing this pain. He has been complaint with his HEP.    Pt reports having ankle surgery in August. He has been in a boot since with improving weight bearing status. His surgeon wants him to come in the CAM boot until he return to his office on 12/22. The pt arrived  without the CAM boot, just in sandals. He is reporting no pain during the day, only when in bed at night. He notes this pain is not every night. Pt wants to get back to work and doing more around the house/in the yard.   PERTINENT HISTORY: DOS 03/30/24 RT FOOT TRIPLE ARTHRODESIS, RT FOOT TENDO ACHILLES LENGTHENING obesity, DM II, OA of knees, HTN, sleep apnea PAIN:  Are you having pain? Yes: NPRS scale: 0/10 current Pain location: top of R foot Pain description: ache Aggravating factors: happens at night when in bed Relieving factors: unknown  PRECAUTIONS: None  RED FLAGS: None   WEIGHT BEARING RESTRICTIONS: Yes FWB in CAM boot  FALLS:  Has patient fallen in last 6 months? No  LIVING ENVIRONMENT: Lives with: lives with their family Lives in: House/apartment Stairs: Yes: Internal: 4-5 steps; can reach both and External: 12-14 steps; can reach both Has following equipment at home: Single point cane and knee scooter  OCCUPATION: truck driver  PLOF: Independent  PATIENT GOALS: get back to work  NEXT MD VISIT: 08/07/2024  OBJECTIVE:  Note: Objective measures were completed at Evaluation unless otherwise noted.  PATIENT SURVEYS:  LEFS  Extreme difficulty/unable (0), Quite a bit of difficulty (1), Moderate difficulty (2), Little difficulty (3), No difficulty (4) Survey date:  07/17/2024  Any of your usual work, housework or school activities 3  2. Usual hobbies, recreational or sporting activities 2  3. Getting into/out of the bath 3  4. Walking between rooms 3  5. Putting on socks/shoes 1  6. Squatting  0  7. Lifting an object, like a bag of groceries from the floor 4  8. Performing light activities around your home 4  9. Performing heavy activities around your home 3  10. Getting into/out of a car 2  11. Walking 2 blocks 1  12. Walking 1 mile 0  13. Going up/down 10 stairs (1 flight) 2  14. Standing for 1 hour 2  15.  sitting for 1 hour 4  16. Running on even  ground 0  17. Running on uneven ground 0  18. Making sharp turns while running fast 0  19. Hopping  0  20. Rolling over in bed 4  Score total:  38/80    COGNITION: Overall cognitive status: Within functional limits for tasks assessed     SENSATION: WFL  EDEMA:  Figure 8: deferred  POSTURE: weight shift left  PALPATION: Tenderness over medial aspect   LOWER EXTREMITY ROM:  Active ROM Right eval Left eval  Hip flexion    Hip extension    Hip abduction    Hip adduction    Hip internal rotation    Hip external rotation    Knee flexion    Knee extension    Ankle dorsiflexion 0   Ankle plantarflexion 30   Ankle inversion 75%  limited   Ankle eversion 50% limited     (Blank rows = not tested)  LOWER EXTREMITY MMT:  MMT Right eval Left eval  Hip flexion    Hip extension    Hip abduction    Hip adduction    Hip internal rotation    Hip external rotation    Knee flexion    Knee extension    Ankle dorsiflexion 4/5   Ankle plantarflexion    Ankle inversion    Ankle eversion 4/5    (Blank rows = not tested)  FUNCTIONAL TESTS:  SLS - unable on R, able on L for 5+ seconds  30 sec STS - deferred, no boot on today                                                                                                                                TREATMENT DATE:  [facility] Adult PT Treatment:                                                DATE: 07/26/2024    Therapeutic Exercise Seated BAPS board level x 20 CW and CCW Standing hip ABD and ext x 10 B  Mini squat x 10   Manual:  STM to posterior and lateral lower leg  Gentle talocrural distraction and P-A MT mobilizations   Modalities:  Vaso to R ankle with elevation x 10 min  PATIENT EDUCATION:  Education details: POC, HEP, diagnosis, prognosis.  Person educated: Patient Education method: Explanation, Demonstration, Tactile cues, Verbal cues, and Handouts Education comprehension: verbalized understanding, returned  demonstration, verbal cues required, tactile cues required, and needs further education  HOME EXERCISE PROGRAM: Access Code: [email or web address] URL: [email or web address] Date: 07/17/2024 Prepared by: Marijo Berber  Exercises - Towel Scrunches  - 2-3 x daily - 7 x weekly - 3 sets - 10 reps - Seated Ankle Alphabet  - 2-3 x daily - 7 x weekly - 3 sets - 10 reps - Seated Ankle Circles  - 2-3 x daily - 7 x weekly - 3 sets - 10 reps - Seated Heel Raise  - 2-3 x daily - 7 x weekly - 3 sets - 10 reps - Seated Ankle Eversion with Resistance  - 2 x daily - 7 x weekly - 2 sets - 10 reps - Long Sitting Ankle Plantar Flexion with Resistance  - 2 x daily - 7 x weekly - 2 sets - 10 reps - Seated Ankle Inversion with Resistance  - 2 x daily - 7 x weekly - 2 sets - 10 reps  Patient Education - Leg Elevation for Swelling  ASSESSMENT:  CLINICAL IMPRESSION: 07/26/2024 Continued with manual to decreased muscle tension and improve ROM. Pt reports less pain after manual. Limited strengthening and AROM performed today in order to incorporate  vasopneumatic for pain and swelling.   The pt will benefit from continued skilled physical therapy to decrease pain and increase function.    Patient is a 63 y.o. male who was seen today for physical therapy evaluation and treatment for s/p R ankle surgery. Pt presents with limited ROM and weakness of the R ankle. Pt is to be in the CAM boot when ambulating but arrived without it on today. Some tests and measures deferred since pt was not in the boot. Pt is currently limited in walking, standing, work tasks, lifting, carrying, pushing/pulling, community activities, and recreational activities. The pt will benefit from skilled physical therapy to return decrease pain and increase function.    OBJECTIVE IMPAIRMENTS: Abnormal gait, decreased balance, decreased endurance, decreased mobility, difficulty walking, decreased ROM, decreased strength, hypomobility, increased  edema, impaired flexibility, improper body mechanics, and pain.   ACTIVITY LIMITATIONS: lifting, bending, standing, squatting, stairs, and dressing  PARTICIPATION LIMITATIONS: driving, community activity, occupation, and church  PERSONAL FACTORS: Time since onset of injury/illness/exacerbation and 3+ comorbidities: obesity, DM II, OA of knees, HTN, sleep apnea are also affecting patient's functional outcome.   REHAB POTENTIAL: Good  CLINICAL DECISION MAKING: Evolving/moderate complexity  EVALUATION COMPLEXITY: Moderate   GOALS: Goals reviewed with patient? Yes  SHORT TERM GOALS: Target date: 08/28/2024 Pt will be compliant and independent with HEP to assist with symptom management/recovery at home.  Baseline: [email or web address] Goal status: INITIAL  2.  Pt will demonstrate 5 degrees of active DF and 45 degrees of active PF.  Baseline:  Ankle dorsiflexion 0  Ankle plantarflexion 30  Ankle inversion 75% limited  Ankle eversion 50% limited    Goal status: INITIAL  3.  Pt will be able to walk 1,000 ft without boot on and less than 3/10 pain.  Baseline: walking with boot Goal status: INITIAL   LONG TERM GOALS: Target date: 10/09/2024  Pt will demonstrate 25% improvement in eversion and inversion AROM. Baseline:  Ankle dorsiflexion 0  Ankle plantarflexion 30  Ankle inversion 75% limited  Ankle eversion 50% limited    Goal status: INITIAL  2.  Pt will be able to perform SLS on the R for at least 5 seconds.  Baseline: unable Goal status: INITIAL  3.  Pt will be able to squat and lift 40lbs x 5 in order to return to work based activities.  Baseline: unable  Goal status: INITIAL  4.  Pt will score a 60/80 or better on the LEFS.  Baseline: 38/80 Goal status: INITIAL  5.  Pt will be comfortable with her final HEP in order to continue any symptom management at home and to avoid regression.   Baseline: [id number] Goal status: INITIAL   PLAN:  PT FREQUENCY: 2x/week  PT  DURATION: 12 weeks  PLANNED INTERVENTIONS: 97164- PT Re-evaluation, 97110-Therapeutic exercises, 97530- Therapeutic activity, 97112- Neuromuscular re-education, 97535- Self Care, 02859- Manual therapy, [id number]- Gait training, [phone]- Aquatic Therapy, [phone]- Electrical stimulation (unattended), 97016- Vasopneumatic device, [id number]- Ionotophoresis 4mg /ml Dexamethasone , 79439 (1-2 muscles), 20561 (3+ muscles)- Dry Needling, Patient/Family education, Balance training, Stair training, Taping, Joint mobilization, Scar mobilization, Cryotherapy, and Moist heat  PLAN FOR NEXT SESSION:  -postop protocol  -FWB in CAM boot.  -PROM and exercises against light resistance  PROM, AROM, gentle strengthening of ankle, hip and knee strengthening.   Marijo DELENA Berber, PT 07/26/2024, 11:07 AM

## 2024-07-28 ENCOUNTER — Ambulatory Visit

## 2024-07-28 DIAGNOSIS — M25571 Pain in right ankle and joints of right foot: Secondary | ICD-10-CM | POA: Diagnosis not present

## 2024-07-28 DIAGNOSIS — R600 Localized edema: Secondary | ICD-10-CM

## 2024-07-28 NOTE — Therapy (Signed)
 OUTPATIENT PHYSICAL THERAPY LOWER EXTREMITY EVALUATION   Patient Name: Sean Lee MRN: [id number] DOB:[birth year], 63 y.o., male Today's Date: 07/28/2024  END OF SESSION:  PT End of Session - 07/28/24 0959     Visit Number 4    Number of Visits 24    Date for Recertification  10/09/24    Authorization Type [employer]    Authorization Time Period no auth required    PT Start Time 1000    PT Stop Time 1040    PT Time Calculation (min) 40 min    Activity Tolerance Patient tolerated treatment well    Behavior During Therapy WFL for tasks assessed/performed            Past Medical History:  Diagnosis Date   Contact dermatitis and other eczema due to plants (except food)    Diabetes mellitus without complication (HCC)    H/O: CVA (cardiovascular accident)    Carotid doppler neg (4/08)   Hematuria    Obesity, unspecified    Other and unspecified hyperlipidemia    Hx - no medication/diet controll and weight loss   Sacroiliitis, not elsewhere classified    Sleep apnea    uses CPAP but not every night   Unspecified essential hypertension    Varicose veins of left lower extremity    Past Surgical History:  Procedure Laterality Date   Carotid dopplers  11/16/2006   Negative   FOOT SURGERY Right 04/05/2024   [facility]- Dr. Thresa Fuse   HERNIA REPAIR     umbilical   TOTAL KNEE ARTHROPLASTY Left 06/18/2022   TOTAL KNEE ARTHROPLASTY Left 06/18/2022   Procedure: LEFT TOTAL KNEE ARTHROPLASTY;  Surgeon: Jerri Kay HERO, MD;  Location: [facility] OR;  Service: Orthopedics;  Laterality: Left;   VENTRAL HERNIA REPAIR N/A 05/04/2024   Procedure: REPAIR, HERNIA, VENTRAL WITH MESH;  Surgeon: Vernetta Berg, MD;  Location: [facility] OR;  Service: General;  Laterality: N/A;   Patient Active Problem List   Diagnosis Date Noted   Class 2 obesity due to excess calories with body mass index (BMI) of 38.0 to 38.9 in adult 02/02/2024   Encounter for screening for HIV 02/02/2024   Encounter for hepatitis C  screening test for low risk patient 02/02/2024   BPH (benign prostatic hyperplasia) 02/02/2024   Productive cough 01/19/2024   Diabetes mellitus screening 01/19/2024   Nocturia 11/05/2022   Status post total left knee replacement 06/18/2022   Primary osteoarthritis of left knee 01/01/2022   Primary osteoarthritis of right knee 01/01/2022   Left knee pain 12/18/2021   Umbilical hernia 12/18/2021   Erectile dysfunction 12/31/2020   Insomnia 04/02/2020   Arthritis of right ankle 07/25/2019   Chronic pain of right ankle 08/31/2018   Routine general medical examination at a health care facility 11/01/2017   Prostate cancer screening 06/28/2017   DM2 (diabetes mellitus, type 2) (HCC) 11/05/2016   Alopecia 11/04/2016   Back pain 05/06/2016   Sleep disorder 08/28/2015   Colon cancer screening 08/28/2015   Viral URI with cough 02/13/2013   Prediabetes 01/02/2013   Varicosities of leg 03/21/2012   Rectus diastasis of lower abdomen 03/21/2012   Obstructive sleep apnea 10/15/2009   Hyperlipidemia associated with type 2 diabetes mellitus (HCC) 02/02/2007   HEMATURIA, MICROSCOPIC 02/02/2007   Essential hypertension 02/01/2007   History of cardiovascular disorder 02/01/2007    PCP: Randeen Laine DELENA, MD  REFERRING PROVIDER:   Janit Thresa HERO, DPM   REFERRING DIAG: [phone] (ICD-10-CM) - S/P foot surgery  THERAPY DIAG:  Pain in right ankle and joints of right foot  Localized edema  Rationale for Evaluation and Treatment: Rehabilitation  ONSET DATE: 03/30/2024  SUBJECTIVE:   SUBJECTIVE STATEMENT: 07/28/2024 Pt reports the burning sensation on his foot was coming from his compression sock falling down around his foot at night. He is having no pain upon arrival.    Pt reports having ankle surgery in August. He has been in a boot since with improving weight bearing status. His surgeon wants him to come in the CAM boot until he return to his office on 12/22. The pt arrived without the  CAM boot, just in sandals. He is reporting no pain during the day, only when in bed at night. He notes this pain is not every night. Pt wants to get back to work and doing more around the house/in the yard.   PERTINENT HISTORY: DOS 03/30/24 RT FOOT TRIPLE ARTHRODESIS, RT FOOT TENDO ACHILLES LENGTHENING obesity, DM II, OA of knees, HTN, sleep apnea PAIN:  Are you having pain? Yes: NPRS scale: 0/10 current Pain location: top of R foot Pain description: ache Aggravating factors: happens at night when in bed Relieving factors: unknown  PRECAUTIONS: None  RED FLAGS: None   WEIGHT BEARING RESTRICTIONS: Yes FWB in CAM boot  FALLS:  Has patient fallen in last 6 months? No  LIVING ENVIRONMENT: Lives with: lives with their family Lives in: House/apartment Stairs: Yes: Internal: 4-5 steps; can reach both and External: 12-14 steps; can reach both Has following equipment at home: Single point cane and knee scooter  OCCUPATION: truck driver  PLOF: Independent  PATIENT GOALS: get back to work  NEXT MD VISIT: 08/07/2024  OBJECTIVE:  Note: Objective measures were completed at Evaluation unless otherwise noted.  PATIENT SURVEYS:  LEFS  Extreme difficulty/unable (0), Quite a bit of difficulty (1), Moderate difficulty (2), Little difficulty (3), No difficulty (4) Survey date:  07/17/2024  Any of your usual work, housework or school activities 3  2. Usual hobbies, recreational or sporting activities 2  3. Getting into/out of the bath 3  4. Walking between rooms 3  5. Putting on socks/shoes 1  6. Squatting  0  7. Lifting an object, like a bag of groceries from the floor 4  8. Performing light activities around your home 4  9. Performing heavy activities around your home 3  10. Getting into/out of a car 2  11. Walking 2 blocks 1  12. Walking 1 mile 0  13. Going up/down 10 stairs (1 flight) 2  14. Standing for 1 hour 2  15.  sitting for 1 hour 4  16. Running on even ground 0  17.  Running on uneven ground 0  18. Making sharp turns while running fast 0  19. Hopping  0  20. Rolling over in bed 4  Score total:  38/80    COGNITION: Overall cognitive status: Within functional limits for tasks assessed     SENSATION: WFL  EDEMA:  Figure 8: deferred  POSTURE: weight shift left  PALPATION: Tenderness over medial aspect   LOWER EXTREMITY ROM:  Active ROM Right eval Left eval  Hip flexion    Hip extension    Hip abduction    Hip adduction    Hip internal rotation    Hip external rotation    Knee flexion    Knee extension    Ankle dorsiflexion 0   Ankle plantarflexion 30   Ankle inversion 75% limited   Ankle  eversion 50% limited     (Blank rows = not tested)  LOWER EXTREMITY MMT:  MMT Right eval Left eval  Hip flexion    Hip extension    Hip abduction    Hip adduction    Hip internal rotation    Hip external rotation    Knee flexion    Knee extension    Ankle dorsiflexion 4/5   Ankle plantarflexion    Ankle inversion    Ankle eversion 4/5    (Blank rows = not tested)  FUNCTIONAL TESTS:  SLS - unable on R, able on L for 5+ seconds  30 sec STS - deferred, no boot on today                                                                                                                                TREATMENT DATE:  [facility] Adult PT Treatment:                                                DATE: 07/28/2024    Neuro Re-Ed: Seated BAPS board level 2 x 20 CW and CCW Seated heel raise with ball squeeze x 20  Seated heel slide for DF x 20  Seated toe extension x 20   Modalities:  Vaso to R ankle with elevation x 10 min  [facility] Adult PT Treatment:                                                DATE: 07/26/2024    Therapeutic Exercise Seated BAPS board level x 20 CW and CCW Standing hip ABD and ext x 10 B  Mini squat x 10   Manual:  STM to posterior and lateral lower leg  Gentle talocrural distraction and P-A MT mobilizations    Modalities:  Vaso to R ankle with elevation x 10 min  PATIENT EDUCATION:  Education details: POC, HEP, diagnosis, prognosis.  Person educated: Patient Education method: Explanation, Demonstration, Tactile cues, Verbal cues, and Handouts Education comprehension: verbalized understanding, returned demonstration, verbal cues required, tactile cues required, and needs further education  HOME EXERCISE PROGRAM: Access Code: [email or web address] URL: [email or web address] Date: 07/17/2024 Prepared by: Marijo Berber  Exercises - Towel Scrunches  - 2-3 x daily - 7 x weekly - 3 sets - 10 reps - Seated Ankle Alphabet  - 2-3 x daily - 7 x weekly - 3 sets - 10 reps - Seated Ankle Circles  - 2-3 x daily - 7 x weekly - 3 sets - 10 reps - Seated Heel Raise  - 2-3 x daily - 7 x weekly - 3 sets - 10 reps - Seated Ankle Eversion with  Resistance  - 2 x daily - 7 x weekly - 2 sets - 10 reps - Long Sitting Ankle Plantar Flexion with Resistance  - 2 x daily - 7 x weekly - 2 sets - 10 reps - Seated Ankle Inversion with Resistance  - 2 x daily - 7 x weekly - 2 sets - 10 reps  Patient Education - Leg Elevation for Swelling  ASSESSMENT:  CLINICAL IMPRESSION: 07/28/2024 Manual was deferred today to incorporate more exercises. Pt unable to extend big toe when performing heel raise. He was advised to try stretching his big toe at home. Continued with vaso as pt had decreased swelling and pain last visit.   The pt will benefit from continued skilled physical therapy to decrease pain and increase function.    Patient is a 63 y.o. male who was seen today for physical therapy evaluation and treatment for s/p R ankle surgery. Pt presents with limited ROM and weakness of the R ankle. Pt is to be in the CAM boot when ambulating but arrived without it on today. Some tests and measures deferred since pt was not in the boot. Pt is currently limited in walking, standing, work tasks, lifting, carrying,  pushing/pulling, community activities, and recreational activities. The pt will benefit from skilled physical therapy to return decrease pain and increase function.    OBJECTIVE IMPAIRMENTS: Abnormal gait, decreased balance, decreased endurance, decreased mobility, difficulty walking, decreased ROM, decreased strength, hypomobility, increased edema, impaired flexibility, improper body mechanics, and pain.   ACTIVITY LIMITATIONS: lifting, bending, standing, squatting, stairs, and dressing  PARTICIPATION LIMITATIONS: driving, community activity, occupation, and church  PERSONAL FACTORS: Time since onset of injury/illness/exacerbation and 3+ comorbidities: obesity, DM II, OA of knees, HTN, sleep apnea are also affecting patient's functional outcome.   REHAB POTENTIAL: Good  CLINICAL DECISION MAKING: Evolving/moderate complexity  EVALUATION COMPLEXITY: Moderate   GOALS: Goals reviewed with patient? Yes  SHORT TERM GOALS: Target date: 08/28/2024 Pt will be compliant and independent with HEP to assist with symptom management/recovery at home.  Baseline: [email or web address] Goal status: INITIAL  2.  Pt will demonstrate 5 degrees of active DF and 45 degrees of active PF.  Baseline:  Ankle dorsiflexion 0  Ankle plantarflexion 30  Ankle inversion 75% limited  Ankle eversion 50% limited    Goal status: INITIAL  3.  Pt will be able to walk 1,000 ft without boot on and less than 3/10 pain.  Baseline: walking with boot Goal status: INITIAL   LONG TERM GOALS: Target date: 10/09/2024  Pt will demonstrate 25% improvement in eversion and inversion AROM. Baseline:  Ankle dorsiflexion 0  Ankle plantarflexion 30  Ankle inversion 75% limited  Ankle eversion 50% limited    Goal status: INITIAL  2.  Pt will be able to perform SLS on the R for at least 5 seconds.  Baseline: unable Goal status: INITIAL  3.  Pt will be able to squat and lift 40lbs x 5 in order to return to work based activities.   Baseline: unable  Goal status: INITIAL  4.  Pt will score a 60/80 or better on the LEFS.  Baseline: 38/80 Goal status: INITIAL  5.  Pt will be comfortable with her final HEP in order to continue any symptom management at home and to avoid regression.   Baseline: [email or web address] Goal status: INITIAL   PLAN:  PT FREQUENCY: 2x/week  PT DURATION: 12 weeks  PLANNED INTERVENTIONS: 97164- PT Re-evaluation, 97110-Therapeutic exercises, 97530- Therapeutic activity, [id number]- Neuromuscular re-education, 97535- Self  Care, 02859- Manual therapy, [phone]- Gait training, [phone]- Aquatic Therapy, [phone]- Electrical stimulation (unattended), 97016- Vasopneumatic device, [id number]- Ionotophoresis 4mg /ml Dexamethasone , 20560 (1-2 muscles), 20561 (3+ muscles)- Dry Needling, Patient/Family education, Balance training, Stair training, Taping, Joint mobilization, Scar mobilization, Cryotherapy, and Moist heat  PLAN FOR NEXT SESSION:  -postop protocol  -FWB in CAM boot.  -PROM and exercises against light resistance  PROM, AROM, gentle strengthening of ankle, hip and knee strengthening.   Marijo DELENA Berber, PT 07/28/2024, 11:42 AM

## 2024-07-31 ENCOUNTER — Ambulatory Visit

## 2024-08-01 ENCOUNTER — Ambulatory Visit

## 2024-08-01 DIAGNOSIS — M25571 Pain in right ankle and joints of right foot: Secondary | ICD-10-CM

## 2024-08-01 DIAGNOSIS — R600 Localized edema: Secondary | ICD-10-CM

## 2024-08-01 NOTE — Therapy (Signed)
 OUTPATIENT PHYSICAL THERAPY TREATMENT NOTE   Patient Name: SAEED TOREN MRN: [id number] DOB:[birth year], 63 y.o., male Today's Date: 08/01/2024  END OF SESSION:  PT End of Session - 08/01/24 1653     Visit Number 5    Number of Visits 24    Date for Recertification  10/09/24    Authorization Type [employer]    Authorization Time Period no auth required    PT Start Time 1700    PT Stop Time 1743    PT Time Calculation (min) 43 min    Activity Tolerance Patient tolerated treatment well    Behavior During Therapy WFL for tasks assessed/performed          Past Medical History:  Diagnosis Date   Contact dermatitis and other eczema due to plants (except food)    Diabetes mellitus without complication (HCC)    H/O: CVA (cardiovascular accident)    Carotid doppler neg (4/08)   Hematuria    Obesity, unspecified    Other and unspecified hyperlipidemia    Hx - no medication/diet controll and weight loss   Sacroiliitis, not elsewhere classified    Sleep apnea    uses CPAP but not every night   Unspecified essential hypertension    Varicose veins of left lower extremity    Past Surgical History:  Procedure Laterality Date   Carotid dopplers  11/16/2006   Negative   FOOT SURGERY Right 04/05/2024   [facility]- Dr. Thresa Fuse   HERNIA REPAIR     umbilical   TOTAL KNEE ARTHROPLASTY Left 06/18/2022   TOTAL KNEE ARTHROPLASTY Left 06/18/2022   Procedure: LEFT TOTAL KNEE ARTHROPLASTY;  Surgeon: Jerri Kay HERO, MD;  Location: [facility] OR;  Service: Orthopedics;  Laterality: Left;   VENTRAL HERNIA REPAIR N/A 05/04/2024   Procedure: REPAIR, HERNIA, VENTRAL WITH MESH;  Surgeon: Vernetta Berg, MD;  Location: [facility] OR;  Service: General;  Laterality: N/A;   Patient Active Problem List   Diagnosis Date Noted   Class 2 obesity due to excess calories with body mass index (BMI) of 38.0 to 38.9 in adult 02/02/2024   Encounter for screening for HIV 02/02/2024   Encounter for hepatitis C screening test for  low risk patient 02/02/2024   BPH (benign prostatic hyperplasia) 02/02/2024   Productive cough 01/19/2024   Diabetes mellitus screening 01/19/2024   Nocturia 11/05/2022   Status post total left knee replacement 06/18/2022   Primary osteoarthritis of left knee 01/01/2022   Primary osteoarthritis of right knee 01/01/2022   Left knee pain 12/18/2021   Umbilical hernia 12/18/2021   Erectile dysfunction 12/31/2020   Insomnia 04/02/2020   Arthritis of right ankle 07/25/2019   Chronic pain of right ankle 08/31/2018   Routine general medical examination at a health care facility 11/01/2017   Prostate cancer screening 06/28/2017   DM2 (diabetes mellitus, type 2) (HCC) 11/05/2016   Alopecia 11/04/2016   Back pain 05/06/2016   Sleep disorder 08/28/2015   Colon cancer screening 08/28/2015   Viral URI with cough 02/13/2013   Prediabetes 01/02/2013   Varicosities of leg 03/21/2012   Rectus diastasis of lower abdomen 03/21/2012   Obstructive sleep apnea 10/15/2009   Hyperlipidemia associated with type 2 diabetes mellitus (HCC) 02/02/2007   HEMATURIA, MICROSCOPIC 02/02/2007   Essential hypertension 02/01/2007   History of cardiovascular disorder 02/01/2007    PCP: Randeen Laine DELENA, MD  REFERRING PROVIDER:   Janit Thresa HERO, DPM   REFERRING DIAG: [phone] (ICD-10-CM) - S/P foot surgery   THERAPY  DIAG:  Pain in right ankle and joints of right foot  Localized edema  Rationale for Evaluation and Treatment: Rehabilitation  ONSET DATE: 03/30/2024  SUBJECTIVE:   SUBJECTIVE STATEMENT: 08/01/2024 Patient reports some soreness this morning. He says he is still having swelling despite icing and elevating the LE.   EVAL: Pt reports having ankle surgery in August. He has been in a boot since with improving weight bearing status. His surgeon wants him to come in the CAM boot until he return to his office on 12/22. The pt arrived without the CAM boot, just in sandals. He is reporting no pain during  the day, only when in bed at night. He notes this pain is not every night. Pt wants to get back to work and doing more around the house/in the yard.   PERTINENT HISTORY: DOS 03/30/24 RT FOOT TRIPLE ARTHRODESIS, RT FOOT TENDO ACHILLES LENGTHENING obesity, DM II, OA of knees, HTN, sleep apnea PAIN:  Are you having pain? Yes: NPRS scale: 0/10 current Pain location: top of R foot Pain description: ache Aggravating factors: happens at night when in bed Relieving factors: unknown  PRECAUTIONS: None  RED FLAGS: None   WEIGHT BEARING RESTRICTIONS: Yes FWB in CAM boot  FALLS:  Has patient fallen in last 6 months? No  LIVING ENVIRONMENT: Lives with: lives with their family Lives in: House/apartment Stairs: Yes: Internal: 4-5 steps; can reach both and External: 12-14 steps; can reach both Has following equipment at home: Single point cane and knee scooter  OCCUPATION: truck driver  PLOF: Independent  PATIENT GOALS: get back to work  NEXT MD VISIT: 08/07/2024  OBJECTIVE:  Note: Objective measures were completed at Evaluation unless otherwise noted.  PATIENT SURVEYS:  LEFS  Extreme difficulty/unable (0), Quite a bit of difficulty (1), Moderate difficulty (2), Little difficulty (3), No difficulty (4) Survey date:  07/17/2024  Any of your usual work, housework or school activities 3  2. Usual hobbies, recreational or sporting activities 2  3. Getting into/out of the bath 3  4. Walking between rooms 3  5. Putting on socks/shoes 1  6. Squatting  0  7. Lifting an object, like a bag of groceries from the floor 4  8. Performing light activities around your home 4  9. Performing heavy activities around your home 3  10. Getting into/out of a car 2  11. Walking 2 blocks 1  12. Walking 1 mile 0  13. Going up/down 10 stairs (1 flight) 2  14. Standing for 1 hour 2  15.  sitting for 1 hour 4  16. Running on even ground 0  17. Running on uneven ground 0  18. Making sharp turns while  running fast 0  19. Hopping  0  20. Rolling over in bed 4  Score total:  38/80    COGNITION: Overall cognitive status: Within functional limits for tasks assessed     SENSATION: WFL  EDEMA:  Figure 8: deferred  POSTURE: weight shift left  PALPATION: Tenderness over medial aspect   LOWER EXTREMITY ROM:  Active ROM Right eval Left eval  Hip flexion    Hip extension    Hip abduction    Hip adduction    Hip internal rotation    Hip external rotation    Knee flexion    Knee extension    Ankle dorsiflexion 0   Ankle plantarflexion 30   Ankle inversion 75% limited   Ankle eversion 50% limited     (Blank rows =  not tested)  LOWER EXTREMITY MMT:  MMT Right eval Left eval  Hip flexion    Hip extension    Hip abduction    Hip adduction    Hip internal rotation    Hip external rotation    Knee flexion    Knee extension    Ankle dorsiflexion 4/5   Ankle plantarflexion    Ankle inversion    Ankle eversion 4/5    (Blank rows = not tested)  FUNCTIONAL TESTS:  SLS - unable on R, able on L for 5+ seconds  30 sec STS - deferred, no boot on today                                                                                                                                TREATMENT DATE:  [facility] Adult PT Treatment:                                                DATE: 08/01/24 Therapeutic Exercise: Standing hip ABD and ext x 10 B  Mini squat x 10  Supine SLR x10 RLE Long sitting ankle PF/DF RTB 2x10 ea RLE Neuromuscular re-ed: Seated heel raise with ball squeeze x 20  Seated heel slide for DF x 20  Seated toe extension x 20  Modalities: Vaso to R ankle with elevation 34 low compression x 10 min   [facility] Adult PT Treatment:                                                DATE: 07/28/2024    Neuro Re-Ed: Seated BAPS board level 2 x 20 CW and CCW Seated heel raise with ball squeeze x 20  Seated heel slide for DF x 20  Seated toe extension x 20   Modalities:   Vaso to R ankle with elevation x 10 min  [facility] Adult PT Treatment:                                                DATE: 07/26/2024    Therapeutic Exercise Seated BAPS board level x 20 CW and CCW Standing hip ABD and ext x 10 B  Mini squat x 10   Manual:  STM to posterior and lateral lower leg  Gentle talocrural distraction and P-A MT mobilizations   Modalities:  Vaso to R ankle with elevation x 10 min  PATIENT EDUCATION:  Education details: POC, HEP, diagnosis, prognosis.  Person educated: Patient Education method: Explanation, Demonstration, Tactile cues, Verbal cues, and Handouts Education comprehension:  verbalized understanding, returned demonstration, verbal cues required, tactile cues required, and needs further education  HOME EXERCISE PROGRAM: Access Code: [email or web address] URL: [email or web address] Date: 07/17/2024 Prepared by: Marijo Berber  Exercises - Towel Scrunches  - 2-3 x daily - 7 x weekly - 3 sets - 10 reps - Seated Ankle Alphabet  - 2-3 x daily - 7 x weekly - 3 sets - 10 reps - Seated Ankle Circles  - 2-3 x daily - 7 x weekly - 3 sets - 10 reps - Seated Heel Raise  - 2-3 x daily - 7 x weekly - 3 sets - 10 reps - Seated Ankle Eversion with Resistance  - 2 x daily - 7 x weekly - 2 sets - 10 reps - Long Sitting Ankle Plantar Flexion with Resistance  - 2 x daily - 7 x weekly - 2 sets - 10 reps - Seated Ankle Inversion with Resistance  - 2 x daily - 7 x weekly - 2 sets - 10 reps  Patient Education - Leg Elevation for Swelling  ASSESSMENT:  CLINICAL IMPRESSION: 08/01/2024 Patient presents to PT reporting some soreness in his ankle and that he continues to have swelling despite icing and elevation. Session today continued to focus on ankle mobility and strengthening with more standing exercises to improve tolerance for standing activity. Patient continues to benefit from skilled PT services and should be progressed as able to improve functional  independence.   EVAL: Patient is a 63 y.o. male who was seen today for physical therapy evaluation and treatment for s/p R ankle surgery. Pt presents with limited ROM and weakness of the R ankle. Pt is to be in the CAM boot when ambulating but arrived without it on today. Some tests and measures deferred since pt was not in the boot. Pt is currently limited in walking, standing, work tasks, lifting, carrying, pushing/pulling, community activities, and recreational activities. The pt will benefit from skilled physical therapy to return decrease pain and increase function.    OBJECTIVE IMPAIRMENTS: Abnormal gait, decreased balance, decreased endurance, decreased mobility, difficulty walking, decreased ROM, decreased strength, hypomobility, increased edema, impaired flexibility, improper body mechanics, and pain.   ACTIVITY LIMITATIONS: lifting, bending, standing, squatting, stairs, and dressing  PARTICIPATION LIMITATIONS: driving, community activity, occupation, and church  PERSONAL FACTORS: Time since onset of injury/illness/exacerbation and 3+ comorbidities: obesity, DM II, OA of knees, HTN, sleep apnea are also affecting patient's functional outcome.   REHAB POTENTIAL: Good  CLINICAL DECISION MAKING: Evolving/moderate complexity  EVALUATION COMPLEXITY: Moderate   GOALS: Goals reviewed with patient? Yes  SHORT TERM GOALS: Target date: 08/28/2024 Pt will be compliant and independent with HEP to assist with symptom management/recovery at home.  Baseline: [email or web address] Goal status: INITIAL  2.  Pt will demonstrate 5 degrees of active DF and 45 degrees of active PF.  Baseline:  Ankle dorsiflexion 0  Ankle plantarflexion 30  Ankle inversion 75% limited  Ankle eversion 50% limited    Goal status: INITIAL  3.  Pt will be able to walk 1,000 ft without boot on and less than 3/10 pain.  Baseline: walking with boot Goal status: INITIAL   LONG TERM GOALS: Target date: 10/09/2024  Pt will  demonstrate 25% improvement in eversion and inversion AROM. Baseline:  Ankle dorsiflexion 0  Ankle plantarflexion 30  Ankle inversion 75% limited  Ankle eversion 50% limited    Goal status: INITIAL  2.  Pt will be able to perform SLS on the R for at least 5 seconds.  Baseline: unable Goal status: INITIAL  3.  Pt will be able to squat and lift 40lbs x 5 in order to return to work based activities.  Baseline: unable  Goal status: INITIAL  4.  Pt will score a 60/80 or better on the LEFS.  Baseline: 38/80 Goal status: INITIAL  5.  Pt will be comfortable with her final HEP in order to continue any symptom management at home and to avoid regression.   Baseline: [id number] Goal status: INITIAL   PLAN:  PT FREQUENCY: 2x/week  PT DURATION: 12 weeks  PLANNED INTERVENTIONS: 97164- PT Re-evaluation, 97110-Therapeutic exercises, 97530- Therapeutic activity, 97112- Neuromuscular re-education, 97535- Self Care, 02859- Manual therapy, [phone]- Gait training, [phone]- Aquatic Therapy, [phone]- Electrical stimulation (unattended), 97016- Vasopneumatic device, [id number]- Ionotophoresis 4mg /ml Dexamethasone , 79439 (1-2 muscles), 20561 (3+ muscles)- Dry Needling, Patient/Family education, Balance training, Stair training, Taping, Joint mobilization, Scar mobilization, Cryotherapy, and Moist heat  PLAN FOR NEXT SESSION:  -postop protocol  -FWB in CAM boot.  -PROM and exercises against light resistance  PROM, AROM, gentle strengthening of ankle, hip and knee strengthening.   Corean Pouch, PTA 08/01/2024, 5:34 PM

## 2024-08-02 ENCOUNTER — Ambulatory Visit

## 2024-08-02 DIAGNOSIS — R600 Localized edema: Secondary | ICD-10-CM

## 2024-08-02 DIAGNOSIS — M25571 Pain in right ankle and joints of right foot: Secondary | ICD-10-CM

## 2024-08-02 NOTE — Therapy (Signed)
 OUTPATIENT PHYSICAL THERAPY TREATMENT NOTE   Patient Name: Sean Lee MRN: [id number] DOB:1960-09-11, 63 y.o., male Today's Date: 08/02/2024  END OF SESSION:  PT End of Session - 08/02/24 0840     Visit Number 6    Number of Visits 24    Date for Recertification  10/09/24    Authorization Type [employer]    Authorization Time Period no auth required    PT Start Time 0832    PT Stop Time 0912    PT Time Calculation (min) 40 min    Activity Tolerance Patient tolerated treatment well    Behavior During Therapy [facility] for tasks assessed/performed           Past Medical History:  Diagnosis Date   Contact dermatitis and other eczema due to plants (except food)    Diabetes mellitus without complication (HCC)    H/O: CVA (cardiovascular accident)    Carotid doppler neg (4/08)   Hematuria    Obesity, unspecified    Other and unspecified hyperlipidemia    Hx - no medication/diet controll and weight loss   Sacroiliitis, not elsewhere classified    Sleep apnea    uses CPAP but not every night   Unspecified essential hypertension    Varicose veins of left lower extremity    Past Surgical History:  Procedure Laterality Date   Carotid dopplers  11/16/2006   Negative   FOOT SURGERY Right 04/05/2024   [facility]- Dr. Thresa Fuse   HERNIA REPAIR     umbilical   TOTAL KNEE ARTHROPLASTY Left 06/18/2022   TOTAL KNEE ARTHROPLASTY Left 06/18/2022   Procedure: LEFT TOTAL KNEE ARTHROPLASTY;  Surgeon: Jerri Kay HERO, MD;  Location: [facility] OR;  Service: Orthopedics;  Laterality: Left;   VENTRAL HERNIA REPAIR N/A 05/04/2024   Procedure: REPAIR, HERNIA, VENTRAL WITH MESH;  Surgeon: Vernetta Berg, MD;  Location: [facility] OR;  Service: General;  Laterality: N/A;   Patient Active Problem List   Diagnosis Date Noted   Class 2 obesity due to excess calories with body mass index (BMI) of 38.0 to 38.9 in adult 02/02/2024   Encounter for screening for HIV 02/02/2024   Encounter for hepatitis C screening test for  low risk patient 02/02/2024   BPH (benign prostatic hyperplasia) 02/02/2024   Productive cough 01/19/2024   Diabetes mellitus screening 01/19/2024   Nocturia 11/05/2022   Status post total left knee replacement 06/18/2022   Primary osteoarthritis of left knee 01/01/2022   Primary osteoarthritis of right knee 01/01/2022   Left knee pain 12/18/2021   Umbilical hernia 12/18/2021   Erectile dysfunction 12/31/2020   Insomnia 04/02/2020   Arthritis of right ankle 07/25/2019   Chronic pain of right ankle 08/31/2018   Routine general medical examination at a health care facility 11/01/2017   Prostate cancer screening 06/28/2017   DM2 (diabetes mellitus, type 2) (HCC) 11/05/2016   Alopecia 11/04/2016   Back pain 05/06/2016   Sleep disorder 08/28/2015   Colon cancer screening 08/28/2015   Viral URI with cough 02/13/2013   Prediabetes 01/02/2013   Varicosities of leg 03/21/2012   Rectus diastasis of lower abdomen 03/21/2012   Obstructive sleep apnea 10/15/2009   Hyperlipidemia associated with type 2 diabetes mellitus (HCC) 02/02/2007   HEMATURIA, MICROSCOPIC 02/02/2007   Essential hypertension 02/01/2007   History of cardiovascular disorder 02/01/2007    PCP: Randeen Laine DELENA, MD  REFERRING PROVIDER:   Janit Thresa HERO, DPM   REFERRING DIAG: [phone] (ICD-10-CM) - S/P foot surgery  THERAPY DIAG:  Pain in right ankle and joints of right foot  Localized edema  Rationale for Evaluation and Treatment: Rehabilitation  ONSET DATE: 03/30/2024  SUBJECTIVE:   SUBJECTIVE STATEMENT: 08/02/2024 Patient reports walking slow on the treadmill last night after PT. He did not have the boot on for this. Pt reports numbness of the big toe and medial ankle, popping when turning the ankle (IV/EV), and no change in the swelling.   EVAL: Pt reports having ankle surgery in August. He has been in a boot since with improving weight bearing status. His surgeon wants him to come in the CAM boot until he  return to his office on 12/22. The pt arrived without the CAM boot, just in sandals. He is reporting no pain during the day, only when in bed at night. He notes this pain is not every night. Pt wants to get back to work and doing more around the house/in the yard.   PERTINENT HISTORY: DOS 03/30/24 RT FOOT TRIPLE ARTHRODESIS, RT FOOT TENDO ACHILLES LENGTHENING obesity, DM II, OA of knees, HTN, sleep apnea PAIN:  Are you having pain? Yes: NPRS scale: 0/10 current Pain location: top of R foot Pain description: ache Aggravating factors: happens at night when in bed Relieving factors: unknown  PRECAUTIONS: None  RED FLAGS: None   WEIGHT BEARING RESTRICTIONS: Yes FWB in CAM boot  FALLS:  Has patient fallen in last 6 months? No  LIVING ENVIRONMENT: Lives with: lives with their family Lives in: House/apartment Stairs: Yes: Internal: 4-5 steps; can reach both and External: 12-14 steps; can reach both Has following equipment at home: Single point cane and knee scooter  OCCUPATION: truck driver  PLOF: Independent  PATIENT GOALS: get back to work  NEXT MD VISIT: 08/07/2024  OBJECTIVE:  Note: Objective measures were completed at Evaluation unless otherwise noted.  PATIENT SURVEYS:  LEFS  Extreme difficulty/unable (0), Quite a bit of difficulty (1), Moderate difficulty (2), Little difficulty (3), No difficulty (4) Survey date:  07/17/2024  Any of your usual work, housework or school activities 3  2. Usual hobbies, recreational or sporting activities 2  3. Getting into/out of the bath 3  4. Walking between rooms 3  5. Putting on socks/shoes 1  6. Squatting  0  7. Lifting an object, like a bag of groceries from the floor 4  8. Performing light activities around your home 4  9. Performing heavy activities around your home 3  10. Getting into/out of a car 2  11. Walking 2 blocks 1  12. Walking 1 mile 0  13. Going up/down 10 stairs (1 flight) 2  14. Standing for 1 hour 2  15.   sitting for 1 hour 4  16. Running on even ground 0  17. Running on uneven ground 0  18. Making sharp turns while running fast 0  19. Hopping  0  20. Rolling over in bed 4  Score total:  38/80    COGNITION: Overall cognitive status: Within functional limits for tasks assessed     SENSATION: WFL  EDEMA:  Figure 8: deferred  POSTURE: weight shift left  PALPATION: Tenderness over medial aspect   LOWER EXTREMITY ROM:  Active ROM Right eval Left eval  Hip flexion    Hip extension    Hip abduction    Hip adduction    Hip internal rotation    Hip external rotation    Knee flexion    Knee extension    Ankle dorsiflexion 0  Ankle plantarflexion 30   Ankle inversion 75% limited   Ankle eversion 50% limited     (Blank rows = not tested)  LOWER EXTREMITY MMT:  MMT Right eval Left eval  Hip flexion    Hip extension    Hip abduction    Hip adduction    Hip internal rotation    Hip external rotation    Knee flexion    Knee extension    Ankle dorsiflexion 4/5   Ankle plantarflexion    Ankle inversion    Ankle eversion 4/5    (Blank rows = not tested)  FUNCTIONAL TESTS:  SLS - unable on R, able on L for 5+ seconds  30 sec STS - deferred, no boot on today                                                                                                                                TREATMENT DATE:  [facility] Adult PT Treatment:                                                DATE: 08/02/24 Neuromuscular re-ed: Seated BAPS board level x 20 CW and CCW Seated heel raise with focus on toe extension x 20  Seated toe extension x 20  Manual:  STM to lower leg, ankle, foot Malleoli mobilization posterior and anterior  Great toe stretch with mobilization into extension    [facility] Adult PT Treatment:                                                DATE: 07/28/2024    Neuro Re-Ed: Seated BAPS board level 2 x 20 CW and CCW Seated heel raise with ball squeeze x 20  Seated heel  slide for DF x 20  Seated toe extension x 20   Modalities:  Vaso to R ankle with elevation x 10 min  [facility] Adult PT Treatment:                                                DATE: 07/26/2024    Therapeutic Exercise Seated BAPS board level x 20 CW and CCW Standing hip ABD and ext x 10 B  Mini squat x 10   Manual:  STM to posterior and lateral lower leg  Gentle talocrural distraction and P-A MT mobilizations   Modalities:  Vaso to R ankle with elevation x 10 min  PATIENT EDUCATION:  Education details: POC, HEP, diagnosis, prognosis.  Person educated: Patient Education method: Explanation, Demonstration, Tactile cues,  Verbal cues, and Handouts Education comprehension: verbalized understanding, returned demonstration, verbal cues required, tactile cues required, and needs further education  HOME EXERCISE PROGRAM: Access Code: [email or web address] URL: [email or web address] Date: 07/17/2024 Prepared by: Marijo Berber  Exercises - Towel Scrunches  - 2-3 x daily - 7 x weekly - 3 sets - 10 reps - Seated Ankle Alphabet  - 2-3 x daily - 7 x weekly - 3 sets - 10 reps - Seated Ankle Circles  - 2-3 x daily - 7 x weekly - 3 sets - 10 reps - Seated Heel Raise  - 2-3 x daily - 7 x weekly - 3 sets - 10 reps - Seated Ankle Eversion with Resistance  - 2 x daily - 7 x weekly - 2 sets - 10 reps - Long Sitting Ankle Plantar Flexion with Resistance  - 2 x daily - 7 x weekly - 2 sets - 10 reps - Seated Ankle Inversion with Resistance  - 2 x daily - 7 x weekly - 2 sets - 10 reps  Patient Education - Leg Elevation for Swelling  ASSESSMENT:  CLINICAL IMPRESSION: 08/02/2024 Patient continues to have limitations into PF and IV/EV. He has audible popping of the ankle when transitioning into EV/IV which causes him to flinch although he notes no pain. Pt is advised to ask his surgeon questions about the swelling, popping, and numbness as well as transitioning out of the boot.   Patient continues  to benefit from skilled PT services and should be progressed as able to improve functional independence.   EVAL: Patient is a 63 y.o. male who was seen today for physical therapy evaluation and treatment for s/p R ankle surgery. Pt presents with limited ROM and weakness of the R ankle. Pt is to be in the CAM boot when ambulating but arrived without it on today. Some tests and measures deferred since pt was not in the boot. Pt is currently limited in walking, standing, work tasks, lifting, carrying, pushing/pulling, community activities, and recreational activities. The pt will benefit from skilled physical therapy to return decrease pain and increase function.    OBJECTIVE IMPAIRMENTS: Abnormal gait, decreased balance, decreased endurance, decreased mobility, difficulty walking, decreased ROM, decreased strength, hypomobility, increased edema, impaired flexibility, improper body mechanics, and pain.   ACTIVITY LIMITATIONS: lifting, bending, standing, squatting, stairs, and dressing  PARTICIPATION LIMITATIONS: driving, community activity, occupation, and church  PERSONAL FACTORS: Time since onset of injury/illness/exacerbation and 3+ comorbidities: obesity, DM II, OA of knees, HTN, sleep apnea are also affecting patient's functional outcome.   REHAB POTENTIAL: Good  CLINICAL DECISION MAKING: Evolving/moderate complexity  EVALUATION COMPLEXITY: Moderate   GOALS: Goals reviewed with patient? Yes  SHORT TERM GOALS: Target date: 08/28/2024 Pt will be compliant and independent with HEP to assist with symptom management/recovery at home.  Baseline: [email or web address] Goal status: INITIAL  2.  Pt will demonstrate 5 degrees of active DF and 45 degrees of active PF.  Baseline:  Ankle dorsiflexion 0  Ankle plantarflexion 30  Ankle inversion 75% limited  Ankle eversion 50% limited    Goal status: INITIAL  3.  Pt will be able to walk 1,000 ft without boot on and less than 3/10 pain.  Baseline: walking  with boot Goal status: INITIAL   LONG TERM GOALS: Target date: 10/09/2024  Pt will demonstrate 25% improvement in eversion and inversion AROM. Baseline:  Ankle dorsiflexion 0  Ankle plantarflexion 30  Ankle inversion 75% limited  Ankle eversion 50% limited    Goal status: INITIAL  2.  Pt will be able to perform SLS on the R for at least 5 seconds.  Baseline: unable Goal status: INITIAL  3.  Pt will be able to squat and lift 40lbs x 5 in order to return to work based activities.  Baseline: unable  Goal status: INITIAL  4.  Pt will score a 60/80 or better on the LEFS.  Baseline: 38/80 Goal status: INITIAL  5.  Pt will be comfortable with her final HEP in order to continue any symptom management at home and to avoid regression.   Baseline: [email or web address] Goal status: INITIAL   PLAN:  PT FREQUENCY: 2x/week  PT DURATION: 12 weeks  PLANNED INTERVENTIONS: 97164- PT Re-evaluation, 97110-Therapeutic exercises, 97530- Therapeutic activity, 97112- Neuromuscular re-education, 97535- Self Care, 02859- Manual therapy, [phone]- Gait training, [phone]- Aquatic Therapy, [phone]- Electrical stimulation (unattended), 97016- Vasopneumatic device, [id number]- Ionotophoresis 4mg /ml Dexamethasone , 79439 (1-2 muscles), 20561 (3+ muscles)- Dry Needling, Patient/Family education, Balance training, Stair training, Taping, Joint mobilization, Scar mobilization, Cryotherapy, and Moist heat  PLAN FOR NEXT SESSION:  -postop protocol  -FWB in CAM boot.  -PROM and exercises against light resistance  PROM, AROM, gentle strengthening of ankle, hip and knee strengthening.   Marijo DELENA Berber, PT 08/02/2024, 9:57 AM

## 2024-08-07 ENCOUNTER — Ambulatory Visit (INDEPENDENT_AMBULATORY_CARE_PROVIDER_SITE_OTHER)

## 2024-08-07 ENCOUNTER — Ambulatory Visit

## 2024-08-07 ENCOUNTER — Encounter: Payer: Self-pay | Admitting: Podiatry

## 2024-08-07 ENCOUNTER — Ambulatory Visit: Admitting: Podiatry

## 2024-08-07 VITALS — Ht 74.0 in | Wt 250.0 lb

## 2024-08-07 DIAGNOSIS — M76821 Posterior tibial tendinitis, right leg: Secondary | ICD-10-CM

## 2024-08-07 DIAGNOSIS — Q6651 Congenital pes planus, right foot: Secondary | ICD-10-CM

## 2024-08-07 NOTE — Progress Notes (Signed)
" ° °  Chief Complaint  Patient presents with   Routine Post Op     POV # 6 DOS 03/30/24 RT FOOT TRIPLE ARTHRODESIS, RT FOOT TENDO ACHILLES LENGTHENING, pt is here to f/u on right ankle/foot after surgery, he states some pain, continues to have swelling, he is in PT states it going ok, no other complaints.     Subjective:  Patient presents today status post triple arthrodesis with tendo Achilles lengthening right.  DOS: 03/30/2024.  He is walking in tennis shoes.  He continues to go to physical therapy.  Steady improvement  Past Medical History:  Diagnosis Date   Contact dermatitis and other eczema due to plants (except food)    Diabetes mellitus without complication (HCC)    H/O: CVA (cardiovascular accident)    Carotid doppler neg (4/08)   Hematuria    Obesity, unspecified    Other and unspecified hyperlipidemia    Hx - no medication/diet controll and weight loss   Sacroiliitis, not elsewhere classified    Sleep apnea    uses CPAP but not every night   Unspecified essential hypertension    Varicose veins of left lower extremity     Past Surgical History:  Procedure Laterality Date   Carotid dopplers  11/16/2006   Negative   FOOT SURGERY Right 04/05/2024   [facility]- Dr. Thresa Fuse   HERNIA REPAIR     umbilical   TOTAL KNEE ARTHROPLASTY Left 06/18/2022   TOTAL KNEE ARTHROPLASTY Left 06/18/2022   Procedure: LEFT TOTAL KNEE ARTHROPLASTY;  Surgeon: Jerri Kay HERO, MD;  Location: [facility] OR;  Service: Orthopedics;  Laterality: Left;   VENTRAL HERNIA REPAIR N/A 05/04/2024   Procedure: REPAIR, HERNIA, VENTRAL WITH MESH;  Surgeon: Vernetta Berg, MD;  Location: [facility] OR;  Service: General;  Laterality: N/A;    Allergies  Allergen Reactions   Benazepril Hcl Cough    Objective/Physical Exam Neurovascular status intact.  All incisions are nicely healed.  Edema noted around the ankle.  No crepitus with palpation or range of motion.  The rear foot actually appears stable without any motion with  inversion and eversion of the foot.  Radiographic Exam RT foot and ankle 08/07/2024:  Stable.  The compression staple across the CC joint and one of the compression staples across the TN joint have broken legs.  Continued collapse of the calcaneal inclination angle on lateral view with recalcitrant abductus of the foot on AP view.    Assessment: 1. s/p triple arthrodesis with tendo Achilles lengthening of right. DOS: 03/30/2024   Plan of Care:  -Patient was evaluated.  X-rays reviewed which appear to be stable - Continue WBAT tennis shoes.  He states that he tried to put on his steel toed work boots but the foot was unable to do so because the swelling -Continue to refrain from work for postoperative recovery an additional 8 weeks -Return to clinic 8 weeks follow-up x-ray  Thresa EMERSON Sar, DPM [facility]  Dr. Thresa EMERSON Sar, DPM    [address] [place], [place] [address]                Office [phone]  Fax [phone]      "

## 2024-08-08 ENCOUNTER — Ambulatory Visit

## 2024-08-09 ENCOUNTER — Ambulatory Visit

## 2024-08-11 ENCOUNTER — Ambulatory Visit

## 2024-08-11 DIAGNOSIS — M25571 Pain in right ankle and joints of right foot: Secondary | ICD-10-CM

## 2024-08-11 DIAGNOSIS — R600 Localized edema: Secondary | ICD-10-CM

## 2024-08-11 NOTE — Therapy (Signed)
 " OUTPATIENT PHYSICAL THERAPY TREATMENT NOTE   Patient Name: Sean Lee MRN: [id number] DOB:1961-02-01, 63 y.o., male Today's Date: 08/11/2024  END OF SESSION:  PT End of Session - 08/11/24 1258     Visit Number 7    Number of Visits 24    Date for Recertification  10/09/24    Authorization Type [employer]    Authorization Time Period no auth required    PT Start Time 1300    PT Stop Time 1340    PT Time Calculation (min) 40 min    Activity Tolerance Patient tolerated treatment well    Behavior During Therapy WFL for tasks assessed/performed         Past Medical History:  Diagnosis Date   Contact dermatitis and other eczema due to plants (except food)    Diabetes mellitus without complication (HCC)    H/O: CVA (cardiovascular accident)    Carotid doppler neg (4/08)   Hematuria    Obesity, unspecified    Other and unspecified hyperlipidemia    Hx - no medication/diet controll and weight loss   Sacroiliitis, not elsewhere classified    Sleep apnea    uses CPAP but not every night   Unspecified essential hypertension    Varicose veins of left lower extremity    Past Surgical History:  Procedure Laterality Date   Carotid dopplers  11/16/2006   Negative   FOOT SURGERY Right 04/05/2024   [facility]- Dr. Thresa Fuse   HERNIA REPAIR     umbilical   TOTAL KNEE ARTHROPLASTY Left 06/18/2022   TOTAL KNEE ARTHROPLASTY Left 06/18/2022   Procedure: LEFT TOTAL KNEE ARTHROPLASTY;  Surgeon: Jerri Kay HERO, MD;  Location: [facility] OR;  Service: Orthopedics;  Laterality: Left;   VENTRAL HERNIA REPAIR N/A 05/04/2024   Procedure: REPAIR, HERNIA, VENTRAL WITH MESH;  Surgeon: Vernetta Berg, MD;  Location: [facility] OR;  Service: General;  Laterality: N/A;   Patient Active Problem List   Diagnosis Date Noted   Class 2 obesity due to excess calories with body mass index (BMI) of 38.0 to 38.9 in adult 02/02/2024   Encounter for screening for HIV 02/02/2024   Encounter for hepatitis C screening test for low  risk patient 02/02/2024   BPH (benign prostatic hyperplasia) 02/02/2024   Productive cough 01/19/2024   Diabetes mellitus screening 01/19/2024   Nocturia 11/05/2022   Status post total left knee replacement 06/18/2022   Primary osteoarthritis of left knee 01/01/2022   Primary osteoarthritis of right knee 01/01/2022   Left knee pain 12/18/2021   Umbilical hernia 12/18/2021   Erectile dysfunction 12/31/2020   Insomnia 04/02/2020   Arthritis of right ankle 07/25/2019   Chronic pain of right ankle 08/31/2018   Routine general medical examination at a health care facility 11/01/2017   Prostate cancer screening 06/28/2017   DM2 (diabetes mellitus, type 2) (HCC) 11/05/2016   Alopecia 11/04/2016   Back pain 05/06/2016   Sleep disorder 08/28/2015   Colon cancer screening 08/28/2015   Viral URI with cough 02/13/2013   Prediabetes 01/02/2013   Varicosities of leg 03/21/2012   Rectus diastasis of lower abdomen 03/21/2012   Obstructive sleep apnea 10/15/2009   Hyperlipidemia associated with type 2 diabetes mellitus (HCC) 02/02/2007   HEMATURIA, MICROSCOPIC 02/02/2007   Essential hypertension 02/01/2007   History of cardiovascular disorder 02/01/2007    PCP: Randeen Laine DELENA, MD  REFERRING PROVIDER:   Janit Thresa HERO, DPM   REFERRING DIAG: [phone] (ICD-10-CM) - S/P foot surgery   THERAPY  DIAG:  Pain in right ankle and joints of right foot  Localized edema  Rationale for Evaluation and Treatment: Rehabilitation  ONSET DATE: 03/30/2024  SUBJECTIVE:   SUBJECTIVE STATEMENT: 08/11/2024 Patient arrives without boot and cane today. He notes no pain upon arrival.   EVAL: Pt reports having ankle surgery in August. He has been in a boot since with improving weight bearing status. His surgeon wants him to come in the CAM boot until he return to his office on 12/22. The pt arrived without the CAM boot, just in sandals. He is reporting no pain during the day, only when in bed at night. He  notes this pain is not every night. Pt wants to get back to work and doing more around the house/in the yard.   PERTINENT HISTORY: DOS 03/30/24 RT FOOT TRIPLE ARTHRODESIS, RT FOOT TENDO ACHILLES LENGTHENING obesity, DM II, OA of knees, HTN, sleep apnea PAIN:  Are you having pain? Yes: NPRS scale: 0/10 current Pain location: top of R foot Pain description: ache Aggravating factors: happens at night when in bed Relieving factors: unknown  PRECAUTIONS: None  RED FLAGS: None   WEIGHT BEARING RESTRICTIONS: Yes FWB in CAM boot  FALLS:  Has patient fallen in last 6 months? No  LIVING ENVIRONMENT: Lives with: lives with their family Lives in: House/apartment Stairs: Yes: Internal: 4-5 steps; can reach both and External: 12-14 steps; can reach both Has following equipment at home: Single point cane and knee scooter  OCCUPATION: truck driver  PLOF: Independent  PATIENT GOALS: get back to work  NEXT MD VISIT: 09/20/2024  OBJECTIVE:  Note: Objective measures were completed at Evaluation unless otherwise noted.  PATIENT SURVEYS:  LEFS  Extreme difficulty/unable (0), Quite a bit of difficulty (1), Moderate difficulty (2), Little difficulty (3), No difficulty (4) Survey date:  07/17/2024  Any of your usual work, housework or school activities 3  2. Usual hobbies, recreational or sporting activities 2  3. Getting into/out of the bath 3  4. Walking between rooms 3  5. Putting on socks/shoes 1  6. Squatting  0  7. Lifting an object, like a bag of groceries from the floor 4  8. Performing light activities around your home 4  9. Performing heavy activities around your home 3  10. Getting into/out of a car 2  11. Walking 2 blocks 1  12. Walking 1 mile 0  13. Going up/down 10 stairs (1 flight) 2  14. Standing for 1 hour 2  15.  sitting for 1 hour 4  16. Running on even ground 0  17. Running on uneven ground 0  18. Making sharp turns while running fast 0  19. Hopping  0  20.  Rolling over in bed 4  Score total:  38/80    COGNITION: Overall cognitive status: Within functional limits for tasks assessed     SENSATION: WFL  EDEMA:  Figure 8: deferred  POSTURE: weight shift left  PALPATION: Tenderness over medial aspect   LOWER EXTREMITY ROM:  Active ROM Right eval Left eval  Hip flexion    Hip extension    Hip abduction    Hip adduction    Hip internal rotation    Hip external rotation    Knee flexion    Knee extension    Ankle dorsiflexion 0   Ankle plantarflexion 30   Ankle inversion 75% limited   Ankle eversion 50% limited     (Blank rows = not tested)  LOWER EXTREMITY MMT:  MMT Right eval Left eval  Hip flexion    Hip extension    Hip abduction    Hip adduction    Hip internal rotation    Hip external rotation    Knee flexion    Knee extension    Ankle dorsiflexion 4/5   Ankle plantarflexion    Ankle inversion    Ankle eversion 4/5    (Blank rows = not tested)  FUNCTIONAL TESTS:  SLS - unable on R, able on L for 5+ seconds  30 sec STS - deferred, no boot on today                                                                                                                                TREATMENT DATE:  [facility] Adult PT Treatment:                                                DATE: 08/11/24 Neuromuscular re-ed: Seated BAPS board level 2 x 20 CW and CCW Seated gastroc stretch with strap 10x5 hold Seated ankle pump with resistance from strap x 10  Seated ankle 4 way with 2lb ankle around mid foot Tandem walking in // bars x 2 laps  Heel walking in // bars x 2 laps  SLS x 10 B  TRX squat x 10    [facility] Adult PT Treatment:                                                DATE: 07/28/2024    Neuro Re-Ed: Seated BAPS board level 2 x 20 CW and CCW Seated heel raise with ball squeeze x 20  Seated heel slide for DF x 20  Seated toe extension x 20   Modalities:  Vaso to R ankle with elevation x 10 min  [facility] Adult PT  Treatment:                                                DATE: 07/26/2024    Therapeutic Exercise Seated BAPS board level x 20 CW and CCW Standing hip ABD and ext x 10 B  Mini squat x 10   Manual:  STM to posterior and lateral lower leg  Gentle talocrural distraction and P-A MT mobilizations   Modalities:  Vaso to R ankle with elevation x 10 min  PATIENT EDUCATION:  Education details: POC, HEP, diagnosis, prognosis.  Person educated: Patient Education method: Explanation, Demonstration, Tactile cues, Verbal cues, and Handouts Education comprehension: verbalized understanding, returned demonstration, verbal cues  required, tactile cues required, and needs further education  HOME EXERCISE PROGRAM: Access Code: [email or web address] URL: [email or web address] Date: 07/17/2024 Prepared by: Marijo Berber  Exercises - Towel Scrunches  - 2-3 x daily - 7 x weekly - 3 sets - 10 reps - Seated Ankle Alphabet  - 2-3 x daily - 7 x weekly - 3 sets - 10 reps - Seated Ankle Circles  - 2-3 x daily - 7 x weekly - 3 sets - 10 reps - Seated Heel Raise  - 2-3 x daily - 7 x weekly - 3 sets - 10 reps - Seated Ankle Eversion with Resistance  - 2 x daily - 7 x weekly - 2 sets - 10 reps - Long Sitting Ankle Plantar Flexion with Resistance  - 2 x daily - 7 x weekly - 2 sets - 10 reps - Seated Ankle Inversion with Resistance  - 2 x daily - 7 x weekly - 2 sets - 10 reps  Patient Education - Leg Elevation for Swelling  ASSESSMENT:  CLINICAL IMPRESSION: 08/11/2024 Patient introduced to balance exercises now that he is out of the boot. Pt demonstrates poor tandem and SL balance. His plantarflexion ROM limitation and weakness inhibits this balance as well as proper gait. Pt given heavier bands to perform plantarflexion at home.  Patient continues to benefit from skilled PT services and should be progressed as able to improve functional independence.   EVAL: Patient is a 63 y.o. male who was seen today  for physical therapy evaluation and treatment for s/p R ankle surgery. Pt presents with limited ROM and weakness of the R ankle. Pt is to be in the CAM boot when ambulating but arrived without it on today. Some tests and measures deferred since pt was not in the boot. Pt is currently limited in walking, standing, work tasks, lifting, carrying, pushing/pulling, community activities, and recreational activities. The pt will benefit from skilled physical therapy to return decrease pain and increase function.    OBJECTIVE IMPAIRMENTS: Abnormal gait, decreased balance, decreased endurance, decreased mobility, difficulty walking, decreased ROM, decreased strength, hypomobility, increased edema, impaired flexibility, improper body mechanics, and pain.   ACTIVITY LIMITATIONS: lifting, bending, standing, squatting, stairs, and dressing  PARTICIPATION LIMITATIONS: driving, community activity, occupation, and church  PERSONAL FACTORS: Time since onset of injury/illness/exacerbation and 3+ comorbidities: obesity, DM II, OA of knees, HTN, sleep apnea are also affecting patient's functional outcome.   REHAB POTENTIAL: Good  CLINICAL DECISION MAKING: Evolving/moderate complexity  EVALUATION COMPLEXITY: Moderate   GOALS: Goals reviewed with patient? Yes  SHORT TERM GOALS: Target date: 08/28/2024 Pt will be compliant and independent with HEP to assist with symptom management/recovery at home.  Baseline: [email or web address] Goal status: INITIAL  2.  Pt will demonstrate 5 degrees of active DF and 45 degrees of active PF.  Baseline:  Ankle dorsiflexion 0  Ankle plantarflexion 30  Ankle inversion 75% limited  Ankle eversion 50% limited    Goal status: INITIAL  3.  Pt will be able to walk 1,000 ft without boot on and less than 3/10 pain.  Baseline: walking with boot Goal status: INITIAL   LONG TERM GOALS: Target date: 10/09/2024  Pt will demonstrate 25% improvement in eversion and inversion AROM. Baseline:   Ankle dorsiflexion 0  Ankle plantarflexion 30  Ankle inversion 75% limited  Ankle eversion 50% limited    Goal status: INITIAL  2.  Pt will be able to perform SLS on the R for at least 5 seconds.  Baseline: unable Goal  status: INITIAL  3.  Pt will be able to squat and lift 40lbs x 5 in order to return to work based activities.  Baseline: unable  Goal status: INITIAL  4.  Pt will score a 60/80 or better on the LEFS.  Baseline: 38/80 Goal status: INITIAL  5.  Pt will be comfortable with her final HEP in order to continue any symptom management at home and to avoid regression.   Baseline: [id number] Goal status: INITIAL   PLAN:  PT FREQUENCY: 2x/week  PT DURATION: 12 weeks  PLANNED INTERVENTIONS: 97164- PT Re-evaluation, 97110-Therapeutic exercises, 97530- Therapeutic activity, 97112- Neuromuscular re-education, 97535- Self Care, 02859- Manual therapy, [phone]- Gait training, [phone]- Aquatic Therapy, [phone]- Electrical stimulation (unattended), 97016- Vasopneumatic device, [id number]- Ionotophoresis 4mg /ml Dexamethasone , 79439 (1-2 muscles), 20561 (3+ muscles)- Dry Needling, Patient/Family education, Balance training, Stair training, Taping, Joint mobilization, Scar mobilization, Cryotherapy, and Moist heat  PLAN FOR NEXT SESSION:  -postop protocol  -FWB in CAM boot.  -PROM and exercises against light resistance  PROM, AROM, gentle strengthening of ankle, hip and knee strengthening.   Marijo DELENA Berber, PT 08/11/2024, 1:18 PM   "

## 2024-08-14 ENCOUNTER — Telehealth: Payer: Self-pay | Admitting: Podiatry

## 2024-08-14 ENCOUNTER — Ambulatory Visit

## 2024-08-14 DIAGNOSIS — M25571 Pain in right ankle and joints of right foot: Secondary | ICD-10-CM | POA: Diagnosis not present

## 2024-08-14 DIAGNOSIS — R600 Localized edema: Secondary | ICD-10-CM

## 2024-08-14 NOTE — Telephone Encounter (Signed)
 cld pt to adv no forms were recd from [employer] but will fax them work note with RTW 09/25/24 and I adv him faxed Melia Clore that note on 08/07/24

## 2024-08-14 NOTE — Therapy (Addendum)
 " DISCHARGE  Pt called and asked to be discharged since he is returning to work.   Marijo Berber PT, DPT 08/29/2024 1:28 PM    OUTPATIENT PHYSICAL THERAPY TREATMENT NOTE   Patient Name: Sean Lee MRN: [id number] DOB:23-Nov-1960, 63 y.o., male Today's Date: 08/14/2024  END OF SESSION:  PT End of Session - 08/14/24 0826     Visit Number 8    Number of Visits 24    Date for Recertification  10/09/24    Authorization Type [employer]    Authorization Time Period no auth required    PT Start Time 0830    PT Stop Time 0910    PT Time Calculation (min) 40 min    Activity Tolerance Patient tolerated treatment well    Behavior During Therapy WFL for tasks assessed/performed          Past Medical History:  Diagnosis Date   Contact dermatitis and other eczema due to plants (except food)    Diabetes mellitus without complication (HCC)    H/O: CVA (cardiovascular accident)    Carotid doppler neg (4/08)   Hematuria    Obesity, unspecified    Other and unspecified hyperlipidemia    Hx - no medication/diet controll and weight loss   Sacroiliitis, not elsewhere classified    Sleep apnea    uses CPAP but not every night   Unspecified essential hypertension    Varicose veins of left lower extremity    Past Surgical History:  Procedure Laterality Date   Carotid dopplers  11/16/2006   Negative   FOOT SURGERY Right 04/05/2024   [facility]- Dr. Thresa Fuse   HERNIA REPAIR     umbilical   TOTAL KNEE ARTHROPLASTY Left 06/18/2022   TOTAL KNEE ARTHROPLASTY Left 06/18/2022   Procedure: LEFT TOTAL KNEE ARTHROPLASTY;  Surgeon: Jerri Kay HERO, MD;  Location: [facility] OR;  Service: Orthopedics;  Laterality: Left;   VENTRAL HERNIA REPAIR N/A 05/04/2024   Procedure: REPAIR, HERNIA, VENTRAL WITH MESH;  Surgeon: Vernetta Berg, MD;  Location: [facility] OR;  Service: General;  Laterality: N/A;   Patient Active Problem List   Diagnosis Date Noted   Class 2 obesity due to excess calories with body mass index  (BMI) of 38.0 to 38.9 in adult 02/02/2024   Encounter for screening for HIV 02/02/2024   Encounter for hepatitis C screening test for low risk patient 02/02/2024   BPH (benign prostatic hyperplasia) 02/02/2024   Productive cough 01/19/2024   Diabetes mellitus screening 01/19/2024   Nocturia 11/05/2022   Status post total left knee replacement 06/18/2022   Primary osteoarthritis of left knee 01/01/2022   Primary osteoarthritis of right knee 01/01/2022   Left knee pain 12/18/2021   Umbilical hernia 12/18/2021   Erectile dysfunction 12/31/2020   Insomnia 04/02/2020   Arthritis of right ankle 07/25/2019   Chronic pain of right ankle 08/31/2018   Routine general medical examination at a health care facility 11/01/2017   Prostate cancer screening 06/28/2017   DM2 (diabetes mellitus, type 2) (HCC) 11/05/2016   Alopecia 11/04/2016   Back pain 05/06/2016   Sleep disorder 08/28/2015   Colon cancer screening 08/28/2015   Viral URI with cough 02/13/2013   Prediabetes 01/02/2013   Varicosities of leg 03/21/2012   Rectus diastasis of lower abdomen 03/21/2012   Obstructive sleep apnea 10/15/2009   Hyperlipidemia associated with type 2 diabetes mellitus (HCC) 02/02/2007   HEMATURIA, MICROSCOPIC 02/02/2007   Essential hypertension 02/01/2007   History of cardiovascular disorder 02/01/2007  PCP: Tower, Laine LABOR, MD  REFERRING PROVIDER:   Janit Thresa HERO, DPM   REFERRING DIAG: [phone] (ICD-10-CM) - S/P foot surgery   THERAPY DIAG:  Pain in right ankle and joints of right foot  Localized edema  Rationale for Evaluation and Treatment: Rehabilitation  ONSET DATE: 03/30/2024  SUBJECTIVE:   SUBJECTIVE STATEMENT: 08/14/2024 Patient reports the black band was too heavy but the green worked well.   EVAL: Pt reports having ankle surgery in August. He has been in a boot since with improving weight bearing status. His surgeon wants him to come in the CAM boot until he return to his office on  12/22. The pt arrived without the CAM boot, just in sandals. He is reporting no pain during the day, only when in bed at night. He notes this pain is not every night. Pt wants to get back to work and doing more around the house/in the yard.   PERTINENT HISTORY: DOS 03/30/24 RT FOOT TRIPLE ARTHRODESIS, RT FOOT TENDO ACHILLES LENGTHENING obesity, DM II, OA of knees, HTN, sleep apnea PAIN:  Are you having pain? Yes: NPRS scale: 0/10 current Pain location: top of R foot Pain description: ache Aggravating factors: happens at night when in bed Relieving factors: unknown  PRECAUTIONS: None  RED FLAGS: None   WEIGHT BEARING RESTRICTIONS: Yes FWB in CAM boot  FALLS:  Has patient fallen in last 6 months? No  LIVING ENVIRONMENT: Lives with: lives with their family Lives in: House/apartment Stairs: Yes: Internal: 4-5 steps; can reach both and External: 12-14 steps; can reach both Has following equipment at home: Single point cane and knee scooter  OCCUPATION: truck driver  PLOF: Independent  PATIENT GOALS: get back to work  NEXT MD VISIT: 09/20/2024  OBJECTIVE:  Note: Objective measures were completed at Evaluation unless otherwise noted.  PATIENT SURVEYS:  LEFS  Extreme difficulty/unable (0), Quite a bit of difficulty (1), Moderate difficulty (2), Little difficulty (3), No difficulty (4) Survey date:  07/17/2024  Any of your usual work, housework or school activities 3  2. Usual hobbies, recreational or sporting activities 2  3. Getting into/out of the bath 3  4. Walking between rooms 3  5. Putting on socks/shoes 1  6. Squatting  0  7. Lifting an object, like a bag of groceries from the floor 4  8. Performing light activities around your home 4  9. Performing heavy activities around your home 3  10. Getting into/out of a car 2  11. Walking 2 blocks 1  12. Walking 1 mile 0  13. Going up/down 10 stairs (1 flight) 2  14. Standing for 1 hour 2  15.  sitting for 1 hour 4  16.  Running on even ground 0  17. Running on uneven ground 0  18. Making sharp turns while running fast 0  19. Hopping  0  20. Rolling over in bed 4  Score total:  38/80    COGNITION: Overall cognitive status: Within functional limits for tasks assessed     SENSATION: WFL  EDEMA:  Figure 8: deferred  POSTURE: weight shift left  PALPATION: Tenderness over medial aspect   LOWER EXTREMITY ROM:  Active ROM Right eval Left eval  Hip flexion    Hip extension    Hip abduction    Hip adduction    Hip internal rotation    Hip external rotation    Knee flexion    Knee extension    Ankle dorsiflexion 0   Ankle  plantarflexion 30   Ankle inversion 75% limited   Ankle eversion 50% limited     (Blank rows = not tested)  LOWER EXTREMITY MMT:  MMT Right eval Left eval  Hip flexion    Hip extension    Hip abduction    Hip adduction    Hip internal rotation    Hip external rotation    Knee flexion    Knee extension    Ankle dorsiflexion 4/5   Ankle plantarflexion    Ankle inversion    Ankle eversion 4/5    (Blank rows = not tested)  FUNCTIONAL TESTS:  SLS - unable on R, able on L for 5+ seconds  30 sec STS - deferred, no boot on today                                                                                                                                TREATMENT DATE:  [facility] Adult PT Treatment:                                                DATE: 08/14/24 Neuromuscular re-ed: Seated BAPS board level 2 x 20 CW and CCW Seated gastroc stretch with strap 10x5 hold Seated PF with red and blue band 2x30  Supine PF with ball on wall x 20  Heel raise x 10  Tandem walking in // bars x 2 laps  Heel walking in // bars x 2 laps  SLS x 5 B  TRX squat x 10    [facility] Adult PT Treatment:                                                DATE: 07/28/2024    Neuro Re-Ed: Seated BAPS board level 2 x 20 CW and CCW Seated heel raise with ball squeeze x 20  Seated heel slide  for DF x 20  Seated toe extension x 20   Modalities:  Vaso to R ankle with elevation x 10 min  [facility] Adult PT Treatment:                                                DATE: 07/26/2024    Therapeutic Exercise Seated BAPS board level x 20 CW and CCW Standing hip ABD and ext x 10 B  Mini squat x 10   Manual:  STM to posterior and lateral lower leg  Gentle talocrural distraction and P-A MT mobilizations   Modalities:  Vaso to R ankle with elevation x 10 min  PATIENT EDUCATION:  Education details: POC, HEP, diagnosis, prognosis.  Person educated: Patient Education method: Explanation, Demonstration, Tactile cues, Verbal cues, and Handouts Education comprehension: verbalized understanding, returned demonstration, verbal cues required, tactile cues required, and needs further education  HOME EXERCISE PROGRAM: Access Code: [email or web address] URL: [email or web address] Date: 07/17/2024 Prepared by: Marijo Berber  Exercises - Towel Scrunches  - 2-3 x daily - 7 x weekly - 3 sets - 10 reps - Seated Ankle Alphabet  - 2-3 x daily - 7 x weekly - 3 sets - 10 reps - Seated Ankle Circles  - 2-3 x daily - 7 x weekly - 3 sets - 10 reps - Seated Heel Raise  - 2-3 x daily - 7 x weekly - 3 sets - 10 reps - Seated Ankle Eversion with Resistance  - 2 x daily - 7 x weekly - 2 sets - 10 reps - Long Sitting Ankle Plantar Flexion with Resistance  - 2 x daily - 7 x weekly - 2 sets - 10 reps - Seated Ankle Inversion with Resistance  - 2 x daily - 7 x weekly - 2 sets - 10 reps  Patient Education - Leg Elevation for Swelling  ASSESSMENT:  CLINICAL IMPRESSION: 08/14/2024 Patient able to feel good gastroc contraction with ball on wall PF. Banded version allowed for more ROM but decreased contraction. Pt also able to perform greater ROM with heel raise today.  Patient continues to benefit from skilled PT services and should be progressed as able to improve functional independence.   EVAL: Patient  is a 63 y.o. male who was seen today for physical therapy evaluation and treatment for s/p R ankle surgery. Pt presents with limited ROM and weakness of the R ankle. Pt is to be in the CAM boot when ambulating but arrived without it on today. Some tests and measures deferred since pt was not in the boot. Pt is currently limited in walking, standing, work tasks, lifting, carrying, pushing/pulling, community activities, and recreational activities. The pt will benefit from skilled physical therapy to return decrease pain and increase function.    OBJECTIVE IMPAIRMENTS: Abnormal gait, decreased balance, decreased endurance, decreased mobility, difficulty walking, decreased ROM, decreased strength, hypomobility, increased edema, impaired flexibility, improper body mechanics, and pain.   ACTIVITY LIMITATIONS: lifting, bending, standing, squatting, stairs, and dressing  PARTICIPATION LIMITATIONS: driving, community activity, occupation, and church  PERSONAL FACTORS: Time since onset of injury/illness/exacerbation and 3+ comorbidities: obesity, DM II, OA of knees, HTN, sleep apnea are also affecting patient's functional outcome.   REHAB POTENTIAL: Good  CLINICAL DECISION MAKING: Evolving/moderate complexity  EVALUATION COMPLEXITY: Moderate   GOALS: Goals reviewed with patient? Yes  SHORT TERM GOALS: Target date: 08/28/2024 Pt will be compliant and independent with HEP to assist with symptom management/recovery at home.  Baseline: [email or web address] Goal status: INITIAL  2.  Pt will demonstrate 5 degrees of active DF and 45 degrees of active PF.  Baseline:  Ankle dorsiflexion 0  Ankle plantarflexion 30  Ankle inversion 75% limited  Ankle eversion 50% limited    Goal status: INITIAL  3.  Pt will be able to walk 1,000 ft without boot on and less than 3/10 pain.  Baseline: walking with boot Goal status: INITIAL   LONG TERM GOALS: Target date: 10/09/2024  Pt will demonstrate 25% improvement in  eversion and inversion AROM. Baseline:  Ankle dorsiflexion 0  Ankle plantarflexion 30  Ankle inversion 75% limited  Ankle eversion 50% limited    Goal status: INITIAL  2.  Pt will be able to perform SLS on the R for at least 5 seconds.  Baseline: unable Goal status: INITIAL  3.  Pt will be able to squat and lift 40lbs x 5 in order to return to work based activities.  Baseline: unable  Goal status: INITIAL  4.  Pt will score a 60/80 or better on the LEFS.  Baseline: 38/80 Goal status: INITIAL  5.  Pt will be comfortable with her final HEP in order to continue any symptom management at home and to avoid regression.   Baseline: [id number] Goal status: INITIAL   PLAN:  PT FREQUENCY: 2x/week  PT DURATION: 12 weeks  PLANNED INTERVENTIONS: 97164- PT Re-evaluation, 97110-Therapeutic exercises, 97530- Therapeutic activity, 97112- Neuromuscular re-education, 97535- Self Care, 02859- Manual therapy, [phone]- Gait training, [phone]- Aquatic Therapy, [phone]- Electrical stimulation (unattended), 97016- Vasopneumatic device, [id number]- Ionotophoresis 4mg /ml Dexamethasone , 79439 (1-2 muscles), 20561 (3+ muscles)- Dry Needling, Patient/Family education, Balance training, Stair training, Taping, Joint mobilization, Scar mobilization, Cryotherapy, and Moist heat  PLAN FOR NEXT SESSION:  -postop protocol  -FWB in CAM boot.  -PROM and exercises against light resistance  PROM, AROM, gentle strengthening of ankle, hip and knee strengthening.   Marijo DELENA Berber, PT 08/14/2024, 9:11 AM   "

## 2024-08-16 ENCOUNTER — Telehealth: Payer: Self-pay | Admitting: Podiatry

## 2024-08-16 NOTE — Telephone Encounter (Signed)
 Sean Lee requests notes from Nov-present Faxed [phone] form/notes No charge since update fo new RTW 09/25/24

## 2024-08-21 ENCOUNTER — Telehealth: Payer: Self-pay | Admitting: Podiatry

## 2024-08-21 ENCOUNTER — Telehealth: Payer: Self-pay | Admitting: Lab

## 2024-08-21 NOTE — Telephone Encounter (Signed)
 Patient is requesting a return to work note before the week is out prefers to pick up in office states will cancel any future appointments states is doing well.

## 2024-08-21 NOTE — Telephone Encounter (Signed)
 He called into sch'ing also, and is req'ing to go back on 10/27/23. Please advised if it can be provided. Thank you

## 2024-08-22 ENCOUNTER — Telehealth: Payer: Self-pay | Admitting: Podiatry

## 2024-08-22 ENCOUNTER — Ambulatory Visit

## 2024-08-22 ENCOUNTER — Encounter: Payer: Self-pay | Admitting: Podiatry

## 2024-08-22 NOTE — Telephone Encounter (Signed)
 cld pt to confirm RTW date. He is ready 08/28/24 with no restrictions. He will pick up copy and I will fax [employer] [phone] and [employer] [phone]

## 2024-08-23 ENCOUNTER — Ambulatory Visit

## 2024-08-28 ENCOUNTER — Ambulatory Visit

## 2024-08-30 ENCOUNTER — Ambulatory Visit

## 2024-09-05 ENCOUNTER — Ambulatory Visit

## 2024-09-07 ENCOUNTER — Ambulatory Visit

## 2024-09-20 ENCOUNTER — Ambulatory Visit: Admitting: Podiatry

## 2024-09-27 ENCOUNTER — Ambulatory Visit: Admitting: Podiatry

## 2024-09-27 ENCOUNTER — Ambulatory Visit
